# Patient Record
Sex: Female | Born: 1950 | Race: White | Hispanic: No | Smoking: Current some day smoker
Health system: Southern US, Community
[De-identification: ages and names within clinical notes are randomized; demographics above are authoritative.]

## PROBLEM LIST (undated history)

## (undated) DIAGNOSIS — I10 Essential (primary) hypertension: Secondary | ICD-10-CM

## (undated) DIAGNOSIS — F419 Anxiety disorder, unspecified: Secondary | ICD-10-CM

## (undated) DIAGNOSIS — G4733 Obstructive sleep apnea (adult) (pediatric): Secondary | ICD-10-CM

## (undated) DIAGNOSIS — F32A Depression, unspecified: Secondary | ICD-10-CM

## (undated) DIAGNOSIS — J449 Chronic obstructive pulmonary disease, unspecified: Secondary | ICD-10-CM

## (undated) DIAGNOSIS — M545 Low back pain, unspecified: Secondary | ICD-10-CM

## (undated) DIAGNOSIS — G8929 Other chronic pain: Secondary | ICD-10-CM

## (undated) DIAGNOSIS — K219 Gastro-esophageal reflux disease without esophagitis: Secondary | ICD-10-CM

## (undated) DIAGNOSIS — I739 Peripheral vascular disease, unspecified: Secondary | ICD-10-CM

## (undated) DIAGNOSIS — F329 Major depressive disorder, single episode, unspecified: Secondary | ICD-10-CM

## (undated) HISTORY — DX: Low back pain: M54.5

## (undated) HISTORY — DX: Chronic obstructive pulmonary disease, unspecified: J44.9

## (undated) HISTORY — PX: CATARACT EXTRACTION: SUR2

## (undated) HISTORY — DX: Obstructive sleep apnea (adult) (pediatric): G47.33

## (undated) HISTORY — PX: SKIN CANCER DESTRUCTION: SHX778

## (undated) HISTORY — DX: Depression, unspecified: F32.A

## (undated) HISTORY — DX: Major depressive disorder, single episode, unspecified: F32.9

## (undated) HISTORY — PX: ABDOMINAL HYSTERECTOMY: SHX81

## (undated) HISTORY — DX: Gastro-esophageal reflux disease without esophagitis: K21.9

## (undated) HISTORY — PX: TUBAL LIGATION: SHX77

## (undated) HISTORY — DX: Anxiety disorder, unspecified: F41.9

## (undated) HISTORY — DX: Low back pain, unspecified: M54.50

## (undated) HISTORY — DX: Other chronic pain: G89.29

---

## 2004-08-06 ENCOUNTER — Ambulatory Visit (HOSPITAL_COMMUNITY): Admission: RE | Admit: 2004-08-06 | Discharge: 2004-08-06 | Payer: Self-pay | Admitting: Family Medicine

## 2004-11-11 ENCOUNTER — Ambulatory Visit: Payer: Self-pay | Admitting: Internal Medicine

## 2004-11-30 ENCOUNTER — Encounter: Payer: Self-pay | Admitting: Internal Medicine

## 2004-11-30 ENCOUNTER — Ambulatory Visit (HOSPITAL_COMMUNITY): Admission: RE | Admit: 2004-11-30 | Discharge: 2004-11-30 | Payer: Self-pay | Admitting: Internal Medicine

## 2004-11-30 ENCOUNTER — Ambulatory Visit: Payer: Self-pay | Admitting: Internal Medicine

## 2004-11-30 HISTORY — PX: ESOPHAGOGASTRODUODENOSCOPY: SHX1529

## 2005-01-20 ENCOUNTER — Ambulatory Visit: Payer: Self-pay | Admitting: Internal Medicine

## 2005-01-20 ENCOUNTER — Ambulatory Visit (HOSPITAL_COMMUNITY): Admission: RE | Admit: 2005-01-20 | Discharge: 2005-01-20 | Payer: Self-pay | Admitting: Internal Medicine

## 2005-01-20 ENCOUNTER — Encounter: Payer: Self-pay | Admitting: Internal Medicine

## 2005-01-20 HISTORY — PX: COLONOSCOPY: SHX174

## 2005-07-30 ENCOUNTER — Ambulatory Visit: Payer: Self-pay | Admitting: *Deleted

## 2005-08-04 ENCOUNTER — Ambulatory Visit (HOSPITAL_COMMUNITY): Admission: RE | Admit: 2005-08-04 | Discharge: 2005-08-04 | Payer: Self-pay | Admitting: *Deleted

## 2005-08-04 ENCOUNTER — Ambulatory Visit: Payer: Self-pay | Admitting: *Deleted

## 2005-08-11 ENCOUNTER — Ambulatory Visit: Payer: Self-pay | Admitting: *Deleted

## 2007-06-16 ENCOUNTER — Emergency Department (HOSPITAL_COMMUNITY): Admission: EM | Admit: 2007-06-16 | Discharge: 2007-06-16 | Payer: Self-pay | Admitting: Emergency Medicine

## 2008-01-29 ENCOUNTER — Emergency Department (HOSPITAL_COMMUNITY): Admission: EM | Admit: 2008-01-29 | Discharge: 2008-01-29 | Payer: Self-pay | Admitting: Emergency Medicine

## 2008-05-07 ENCOUNTER — Emergency Department (HOSPITAL_COMMUNITY): Admission: EM | Admit: 2008-05-07 | Discharge: 2008-05-07 | Payer: Self-pay | Admitting: Emergency Medicine

## 2008-11-12 ENCOUNTER — Emergency Department (HOSPITAL_COMMUNITY): Admission: EM | Admit: 2008-11-12 | Discharge: 2008-11-12 | Payer: Self-pay | Admitting: Emergency Medicine

## 2008-12-18 ENCOUNTER — Emergency Department (HOSPITAL_COMMUNITY): Admission: EM | Admit: 2008-12-18 | Discharge: 2008-12-18 | Payer: Self-pay | Admitting: Emergency Medicine

## 2009-04-09 ENCOUNTER — Emergency Department (HOSPITAL_COMMUNITY): Admission: EM | Admit: 2009-04-09 | Discharge: 2009-04-09 | Payer: Self-pay | Admitting: Emergency Medicine

## 2009-12-26 ENCOUNTER — Inpatient Hospital Stay (HOSPITAL_COMMUNITY): Admission: EM | Admit: 2009-12-26 | Discharge: 2009-12-28 | Payer: Self-pay | Admitting: Emergency Medicine

## 2010-07-24 LAB — CULTURE, BLOOD (ROUTINE X 2): Culture: NO GROWTH

## 2010-07-24 LAB — URINE MICROSCOPIC-ADD ON

## 2010-07-24 LAB — CBC
HCT: 45.6 % (ref 36.0–46.0)
Hemoglobin: 13.7 g/dL (ref 12.0–15.0)
MCH: 31.4 pg (ref 26.0–34.0)
MCH: 31.7 pg (ref 26.0–34.0)
MCHC: 33.5 g/dL (ref 30.0–36.0)
Platelets: 306 10*3/uL (ref 150–400)
Platelets: 362 10*3/uL (ref 150–400)
RBC: 4.33 MIL/uL (ref 3.87–5.11)
RDW: 13.1 % (ref 11.5–15.5)
RDW: 13.8 % (ref 11.5–15.5)
WBC: 19.3 10*3/uL — ABNORMAL HIGH (ref 4.0–10.5)

## 2010-07-24 LAB — BASIC METABOLIC PANEL
BUN: 11 mg/dL (ref 6–23)
BUN: 17 mg/dL (ref 6–23)
BUN: 6 mg/dL (ref 6–23)
BUN: 8 mg/dL (ref 6–23)
CO2: 23 mEq/L (ref 19–32)
CO2: 24 mEq/L (ref 19–32)
Calcium: 8.2 mg/dL — ABNORMAL LOW (ref 8.4–10.5)
Calcium: 8.3 mg/dL — ABNORMAL LOW (ref 8.4–10.5)
Calcium: 8.5 mg/dL (ref 8.4–10.5)
Chloride: 107 mEq/L (ref 96–112)
Chloride: 98 mEq/L (ref 96–112)
Creatinine, Ser: 0.59 mg/dL (ref 0.4–1.2)
Creatinine, Ser: 0.64 mg/dL (ref 0.4–1.2)
Creatinine, Ser: 0.66 mg/dL (ref 0.4–1.2)
Creatinine, Ser: 0.82 mg/dL (ref 0.4–1.2)
GFR calc Af Amer: >60 mL/min (ref >60)
GFR calc Af Amer: >60 mL/min (ref >60)
GFR calc Af Amer: >60 mL/min (ref >60)
GFR calc non Af Amer: >60 mL/min (ref >60)
GFR calc non Af Amer: >60 mL/min (ref >60)
GFR calc non Af Amer: >60 mL/min (ref >60)
Glucose, Bld: 102 mg/dL — ABNORMAL HIGH (ref 70–99)
Glucose, Bld: 116 mg/dL — ABNORMAL HIGH (ref 70–99)
Glucose, Bld: 168 mg/dL — ABNORMAL HIGH (ref 70–99)
Glucose, Bld: 89 mg/dL (ref 70–99)
Potassium: 2.3 mEq/L — CL (ref 3.5–5.1)
Potassium: 2.5 mEq/L — CL (ref 3.5–5.1)
Potassium: 3.8 mEq/L (ref 3.5–5.1)
Sodium: 136 mEq/L (ref 135–145)
Sodium: 140 mEq/L (ref 135–145)
Sodium: 141 mEq/L (ref 135–145)

## 2010-07-24 LAB — RAPID URINE DRUG SCREEN, HOSP PERFORMED: Benzodiazepines: NOT DETECTED

## 2010-07-24 LAB — DIFFERENTIAL
Basophils Absolute: 0.1 10*3/uL (ref 0.0–0.1)
Basophils Absolute: 0.1 10*3/uL (ref 0.0–0.1)
Basophils Relative: 0 % (ref 0–1)
Basophils Relative: 0 % (ref 0–1)
Basophils Relative: 1 % (ref 0–1)
Eosinophils Absolute: 0 10*3/uL (ref 0.0–0.7)
Eosinophils Absolute: 0.2 10*3/uL (ref 0.0–0.7)
Lymphocytes Relative: 19 % (ref 12–46)
Lymphocytes Relative: 26 % (ref 12–46)
Monocytes Absolute: 0.9 10*3/uL (ref 0.1–1.0)
Monocytes Relative: 8 % (ref 3–12)
Neutro Abs: 14.1 10*3/uL — ABNORMAL HIGH (ref 1.7–7.7)
Neutro Abs: 8.7 10*3/uL — ABNORMAL HIGH (ref 1.7–7.7)
Neutrophils Relative %: 66 % (ref 43–77)

## 2010-07-24 LAB — URINALYSIS, ROUTINE W REFLEX MICROSCOPIC
Bilirubin Urine: NEGATIVE
Ketones, ur: NEGATIVE mg/dL
Leukocytes, UA: NEGATIVE
Protein, ur: 100 mg/dL — AB
Specific Gravity, Urine: 1.01 (ref 1.005–1.030)
Urobilinogen, UA: 0.2 mg/dL (ref 0.0–1.0)

## 2010-07-24 LAB — CK
Total CK: 467 U/L — ABNORMAL HIGH (ref 7–177)
Total CK: 727 U/L — ABNORMAL HIGH (ref 7–177)

## 2010-07-24 LAB — CARDIAC PANEL(CRET KIN+CKTOT+MB+TROPI)
CK, MB: 13.9 ng/mL (ref 0.3–4.0)
Relative Index: 1 (ref 0.0–2.5)
Total CK: 1265 U/L — ABNORMAL HIGH (ref 7–177)
Total CK: 1632 U/L — ABNORMAL HIGH (ref 7–177)
Troponin I: 0.04 ng/mL (ref 0.00–0.06)

## 2010-07-24 LAB — TROPONIN I: Troponin I: 0.06 ng/mL (ref 0.00–0.06)

## 2010-07-24 LAB — TSH: TSH: 1.014 u[IU]/mL (ref 0.350–4.500)

## 2010-07-24 LAB — ETHANOL: Alcohol, Ethyl (B): <5 mg/dL (ref 0–10)

## 2010-07-24 LAB — MAGNESIUM: Magnesium: 2.2 mg/dL (ref 1.5–2.5)

## 2010-07-24 LAB — PHOSPHORUS: Phosphorus: 2.8 mg/dL (ref 2.3–4.6)

## 2010-08-16 LAB — CBC
Hemoglobin: 15.7 g/dL — ABNORMAL HIGH (ref 12.0–15.0)
MCHC: 34.6 g/dL (ref 30.0–36.0)
MCV: 92.6 fL (ref 78.0–100.0)
RBC: 4.9 MIL/uL (ref 3.87–5.11)

## 2010-08-16 LAB — BASIC METABOLIC PANEL
CO2: 29 mEq/L (ref 19–32)
Chloride: 105 mEq/L (ref 96–112)
Creatinine, Ser: 0.65 mg/dL (ref 0.4–1.2)
GFR calc Af Amer: >60 mL/min (ref >60)

## 2010-08-16 LAB — DIFFERENTIAL
Basophils Relative: 0 % (ref 0–1)
Eosinophils Absolute: 0.1 10*3/uL (ref 0.0–0.7)
Eosinophils Relative: 1 % (ref 0–5)
Monocytes Absolute: 0.8 10*3/uL (ref 0.1–1.0)
Monocytes Relative: 7 % (ref 3–12)
Neutrophils Relative %: 56 % (ref 43–77)

## 2010-08-16 LAB — URINALYSIS, ROUTINE W REFLEX MICROSCOPIC
Bilirubin Urine: NEGATIVE
Ketones, ur: NEGATIVE mg/dL
Nitrite: NEGATIVE
pH: 5.5 (ref 5.0–8.0)

## 2010-09-25 NOTE — Procedures (Signed)
NAMEEARNIE, ROCKHOLD               ACCOUNT NO.:  [id number]   MEDICAL RECORD NO.:  [id number]          PATIENT TYPE:  OUT   LOCATION:  RAD                           FACILITY:  [facility]   PHYSICIAN:  [place] Bing, M.D. [facility] OF BIRTH:  09-29-1950   DATE OF PROCEDURE:  08/04/2005  DATE OF DISCHARGE:                                  ECHOCARDIOGRAM   REFERRING:  Dr. Phillips Odor and Dr. Dorethea Clan.   CLINICAL DATA:  A 60 year old woman with hypertension and chest pain.   1.  Baseline echocardiogram reveals normal chamber dimensions; no      significant valvular abnormalities; normal left ventricular size and      function without significant LVH.  2.  Progressive dobutamine infusion undertaken.  The patient reported      palpitations and leg pain associated with dyspnea.  3.  A peak heart rate of 142 was achieved, 87% of the patient's age-      predicted maximum.  4.  No significant arrhythmias - few PACs and PVCs.  5.  Baseline EKG:  Sinus bradycardia; prominent voltage; delayed R-wave      progression; otherwise normal. Stress EKG:  No significant change.  6.  Post - exercise imaging:  Hyperdynamic function in all segments with a      decrease in left ventricular volume.   IMPRESSION:  Negative pharmacologic stress echocardiogram revealing neither  electrocardiographic nor echocardiographic evidence for myocardial ischemia  or infarction.  Other findings as noted.      [place] Bing, M.D. [facility]  Electronically Signed     RR/MEDQ  D:  08/04/2005  T:  08/06/2005  Job:  [id number]

## 2010-09-25 NOTE — Op Note (Signed)
Lauren King, Lauren King               ACCOUNT NO.:  [id number]   MEDICAL RECORD NO.:  [id number]          PATIENT TYPE:  AMB   LOCATION:  DAY                           FACILITY:  [facility]   PHYSICIAN:  R. Roetta Sessions, M.D. DATE OF BIRTH:  Jun 25, 1950   DATE OF PROCEDURE:  11/30/2004  DATE OF DISCHARGE:                                 OPERATIVE REPORT   PROCEDURE:  Esophagogastroduodenoscopy with balloon dilation and probable  biopsy.   ENDOSCOPIST:  Jonathon Bellows, M.D.   INDICATIONS FOR PROCEDURE:  The patient is a 60 year old lady with  intermittent esophageal dysphagia worse with anxiety episodes, intermittent  abdominal cramps, and diarrhea, more recently improved with NuLev.  EGD is  now being done to further evaluate her upper GI tract symptoms.  The  potential risks, benefits, and alternatives have been discussed and  questions answered.  She is agreeable.  Please see documentation in the  medical record.   Oxygen saturation, blood pressure, pulse oximetry were monitored throughout  the entire procedure.  Conscious sedation with Versed 5 mg intravenously,  Demerol 100 mg intravenously was given in divided doses.   INSTRUMENT:  Olympus video system.   FINDINGS:  1.  Esophagus: View of the tubular esophagus revealed an undulating      essential Z-line and a 3 x 5 mm patch of salmon-colored epithelium in      the distal esophagus with a good centimeter of intervening pearly-gray      normal-appearing esophageal mucosa between this area and the EG      junction.  Esophageal mucosa, otherwise, appeared normal.  There was no      ring or stricture.  Tubular esophagus was widely patent and easily      traversed.  2.  Stomach: The gastric cavity was empty and insufflated well there.  A      thorough examination of the gastric mucosa including retroflexed view of      the proximal view of the proximal stomach and esophagogastric junction      demonstrated linear antral erosions and  a couple of small stellate-      shaped erosions in the prepyloric antral mucosa.  No infiltrating      process, no frank ulcer.  Pylorus patent and easily traversed.      Examination of the bulb to the second portion revealed no abnormalities      aside from a solitary AVM in the posterior bulb.   THERAPY/DIAGNOSTIC MANEUVERS PERFORMED:  A 54-French balloon dilator was  passed with full insertion.  Loop back revealed no apparent complication  related to passage of the dilator.  The patch of salmon-colored epithelium  in the distal esophagus was biopsied for histologic study.  The patient  tolerated the procedure well and was reactive in the endoscopy room.   IMPRESSION:  1.  A small patch of salmon-colored epithelium (salmon-like lesion in the      distal esophagus) biopsied, otherwise normal tubular esophagus status      post dilation prior to biopsy.  2.  Antral pre-pyloric erosions of uncertain significance, otherwise,  normal      gastric mucosa.  3.  Solitary duodenal bulbar arteriovenous malformation, otherwise, normal      D1 and D2.   RECOMMENDATIONS:  1.  Follow up on pathology.  2.  Obtain Helicobacter pylori serologies.  3.  Continue NuLev.  4.  Further recommendations to follow.       RMR/MEDQ  D:  11/30/2004  T:  11/30/2004  Job:  [id number]   cc:   Corrie Mckusick, M.D.  Fax: [phone]

## 2010-09-25 NOTE — Op Note (Signed)
NAMECASSI, JENNE               ACCOUNT NO.:  [id number]   MEDICAL RECORD NO.:  [id number]          PATIENT TYPE:  AMB   LOCATION:  DAY                           FACILITY:  [facility]   PHYSICIAN:  R. Roetta Sessions, M.D. DATE OF BIRTH:  01/15/1951   DATE OF PROCEDURE:  01/20/2005  DATE OF DISCHARGE:                                 OPERATIVE REPORT   PROCEDURE:  Colonoscopy with snare polypectomy ablation.   INDICATIONS FOR PROCEDURE:  The patient is a 60 year old lady who currently  has no lower GI tract symptoms who has never had a colonoscopy who comes now  for screening. She has never had her colon imaged previously. There is no  family history colorectal neoplasia. Colonoscopy is now being done.  Potential risks, benefits, and alternatives have been reviewed and questions  answered. She is agreeable. Please see documentation in the medical record.   PROCEDURE NOTE:  O2 saturation, blood pressure, pulse, and respirations were  monitored throughout the entire procedure. Conscious sedation with Versed 5  mg IV and Demerol 125 mg IV in divided doses.   INSTRUMENT:  Olympus video chip system.   FINDINGS:  Digital rectal exam revealed no abnormalities.   ENDOSCOPIC FINDINGS:  Prep was adequate.   Rectum:  Examination of the rectal mucosa including retroflexed view of the  anal verge revealed no abnormalities.   Colon:  Colonic mucosa was surveyed from the rectosigmoid junction through  the left, transverse, and right colon to the area of the appendiceal  orifice, ileocecal valve, and cecum. These structures were well seen and  photographed for the record. From this level, the scope was slowly  withdrawn, and all previously mentioned mucosal surfaces were again seen.  The patient was noted to have a 1.5 cm x 2 cm sausage-shaped polyp on a fold  in the mid ascending colon. It was resected with snare cautery and recovered  through the scope. There were a couple of diminutive polyps at  30 cm which  were ablated with the tip of the snare cautery unit. The remainder of the  colonic mucosa appeared normal. The patient tolerated the procedure well and  was reactive to endoscopy.   IMPRESSION:  1.  Normal rectum.  2.  Diminutive polyps at 30 cm ablated with the tip of the snare cautery      unit.  3.  Ascending colon polyp removed with snare polypectomy as described above.      The remainder of the colonic mucosa appeared normal.   RECOMMENDATIONS:  1.  No aspirin or arthritis medications for 10 days.  2.  Follow up on pathology.  3.  Further recommendations to follow.      Jonathon Bellows, M.D.  Electronically Signed     RMR/MEDQ  D:  01/20/2005  T:  01/20/2005  Job:  [id number]   cc:   Corrie Mckusick, M.D.  Fax: [phone]

## 2010-09-25 NOTE — Procedures (Signed)
[name], Lauren King               ACCOUNT NO.:  [id number]   MEDICAL RECORD NO.:  [id number]          PATIENT TYPE:  OUT   LOCATION:  RAD                           FACILITY:  [facility]   PHYSICIAN:  [place] Bing, M.D. [facility] OF BIRTH:  Jan 14, 1951   DATE OF PROCEDURE:  08/04/2005  DATE OF DISCHARGE:                                  ECHOCARDIOGRAM   REFERRING:  Dr. Phillips Odor and Dr. Dorethea Clan.   CLINICAL DATA:  A 60 year old woman with chest pain, hypertension and  diabetes.   M-MODE:  Aorta 2.7, left atrium 2.5, septum 1.0, posterior wall 0.8, LV  diastole 4.3, LV systole 3.3.   1.  Technically adequate echocardiographic study.  2.  Normal left atrium, right atrium and right ventricle.  3.  Slight thickening of the mitral and aortic valve; trileaflet aortic      valve; normal mitral and aortic valve function.  4.  Normal pulmonic valve, tricuspid valve and proximal pulmonary artery.  5.  Normal left ventricular size; normal wall thickness; normal regional and      global function.  6.  Normal IVC.      [place] Bing, M.D. [facility]  Electronically Signed     RR/MEDQ  D:  08/04/2005  T:  08/06/2005  Job:  [id number]

## 2011-02-08 LAB — URINALYSIS, ROUTINE W REFLEX MICROSCOPIC
Bilirubin Urine: NEGATIVE
Ketones, ur: NEGATIVE
Protein, ur: NEGATIVE
Urobilinogen, UA: 0.2

## 2012-08-29 ENCOUNTER — Encounter: Payer: Self-pay | Admitting: Internal Medicine

## 2012-09-20 ENCOUNTER — Encounter: Payer: Self-pay | Admitting: Internal Medicine

## 2012-09-21 ENCOUNTER — Ambulatory Visit (INDEPENDENT_AMBULATORY_CARE_PROVIDER_SITE_OTHER): Payer: Medicare Other | Admitting: Gastroenterology

## 2012-09-21 ENCOUNTER — Encounter: Payer: Self-pay | Admitting: Gastroenterology

## 2012-09-21 VITALS — BP 139/85 | HR 87 | Temp 98.2°F | Ht 62.0 in | Wt 127.8 lb

## 2012-09-21 DIAGNOSIS — R1314 Dysphagia, pharyngoesophageal phase: Secondary | ICD-10-CM

## 2012-09-21 DIAGNOSIS — K219 Gastro-esophageal reflux disease without esophagitis: Secondary | ICD-10-CM

## 2012-09-21 DIAGNOSIS — R634 Abnormal weight loss: Secondary | ICD-10-CM

## 2012-09-21 DIAGNOSIS — R1310 Dysphagia, unspecified: Secondary | ICD-10-CM

## 2012-09-21 NOTE — Assessment & Plan Note (Signed)
62 year old lady with history of chronic GERD who presents with recent progressive esophageal dysphagia. Suspect esophageal stricture. Typical GERD symptoms fairly well-controlled with occasional breakthrough symptoms. She has had a modest weight loss in the last few weeks which she states is related to her dysphagia. EGD with dilation in the near future.  I have discussed the risks, alternatives, benefits with regards to but not limited to the risk of reaction to medication, bleeding, infection, perforation and the patient is agreeable to proceed. Written consent to be obtained.  Given her polypharmacy, will augment conscious sedation with Phenergan 25 mg IV 30 minutes before the procedure.

## 2012-09-21 NOTE — Progress Notes (Signed)
Primary Care Physician:  Ernestine Conrad, MD  Primary Gastroenterologist:  Roetta Sessions, MD   Chief Complaint  Patient presents with  . Dysphagia    HPI:  Lauren King is a 62 y.o. female here with couple of month history of dysphagia to breads/meats. Intermittent heartburn on Nexium. Flushes food down with liquids. No n/v. No abdominal pain. No constipation, diarrhea, melena, brbpr. Weight loss of six pounds in the last three weeks.    Current Outpatient Prescriptions  Medication Sig Dispense Refill  . ALPRAZolam (XANAX) 1 MG tablet Take 1 mg by mouth at bedtime as needed for sleep.      [name] esomeprazole (NEXIUM) 40 MG capsule Take 40 mg by mouth daily before breakfast.      . Fluticasone-Salmeterol (ADVAIR) 250-50 MCG/DOSE AEPB Inhale 1 puff into the lungs every 12 (twelve) hours.      . gabapentin (NEURONTIN) 600 MG tablet Take 600 mg by mouth 3 (three) times daily.      [name] HYDROcodone-acetaminophen (LORTAB) 7.5-500 MG per tablet Take 1 tablet by mouth every 6 (six) hours as needed for pain.      [name] ibuprofen (ADVIL,MOTRIN) 800 MG tablet Take 800 mg by mouth every 8 (eight) hours as needed for pain.      [name] lisinopril (PRINIVIL,ZESTRIL) 5 MG tablet Take 5 mg by mouth daily.      . QUEtiapine (SEROQUEL) 300 MG tablet Take 300 mg by mouth at bedtime.      . sertraline (ZOLOFT) 100 MG tablet Take 100 mg by mouth daily.       No current facility-administered medications for this visit.    Allergies as of 09/21/2012  . (No Known Allergies)    Past Medical History  Diagnosis Date  . COPD (chronic obstructive pulmonary disease)   . Depression   . Anxiety   . Chronic lower back pain   . Diabetes mellitus type 2, diet-controlled   . Obstructive sleep apnea     CPAP  . GERD (gastroesophageal reflux disease)     Past Surgical History  Procedure Laterality Date  . Esophagogastroduodenoscopy  11/30/2004    RMR:A small patch of salmon-colored epithelium (salmon-like lesion in the distal  esophagus)/Antral pre-pyloric erosions of uncertain significance, otherwise, normal. solitary duodenal bulbar AVM.No Barrett's.  . Colonoscopy  01/20/2005    XBJ:YNWGNFAOZ King polyp/Diminutive polyps at 30 cm ablated/Normal rectum. hyperplastic polyp  . Tubal ligation      with incidental appendectomy  . Abdominal hysterectomy    . Skin cancer destruction      Family History  Problem Relation Age of Onset  . King cancer Neg Hx   . Lung cancer Sister   . Lung cancer Brother     History   Social History  . Marital Status: Divorced    Spouse Name: N/A    Number of Children: N/A  . Years of Education: N/A   Occupational History  . Not on file.   Social History Main Topics  . Smoking status: Current Every Day Smoker -- 0.50 packs/day    Types: Cigarettes  . Smokeless tobacco: Not on file     Comment: 5 a day  . Alcohol Use: No  . Drug Use: No  . Sexually Active: Not on file   Other Topics Concern  . Not on file   Social History Narrative  . No narrative on file      ROS:  General: Negative for anorexia, fever, chills, fatigue, weakness. She history of  present illness Eyes: Negative for vision changes.  ENT: Negative for hoarseness,  nasal congestion. CV: Negative for chest pain, angina, palpitations, dyspnea on exertion, peripheral edema.  Respiratory: Negative for dyspnea at rest, dyspnea on exertion, cough, sputum, wheezing.  GI: See history of present illness. GU:  Negative for dysuria, hematuria, urinary incontinence, urinary frequency, nocturnal urination.  MS: Negative for joint pain. Chronic low back pain.  Derm: Negative for rash or itching.  Neuro: Negative for weakness, abnormal sensation, seizure, frequent headaches, memory loss, confusion.  Psych: Negative for anxiety, depression, suicidal ideation, hallucinations.  Endo: See history of present illness Heme: Negative for bruising or bleeding. Allergy: Negative for rash or hives.    Physical  Examination:  BP 139/85  Pulse 87  Temp(Src) 98.2 F (36.8 C) (Oral)  Ht 5\' 2"  (1.575 m)  Wt 127 lb 12.8 oz (57.97 kg)  BMI 23.37 kg/m2   General: Well-nourished, well-developed in no acute distress. Anxious Head: Normocephalic, atraumatic.   Eyes: Conjunctiva pink, no icterus. Mouth: Oropharyngeal mucosa moist and pink , no lesions erythema or exudate. Neck: Supple without thyromegaly, masses, or lymphadenopathy.  Lungs: Clear to auscultation bilaterally.  Heart: Regular rate and rhythm, no murmurs rubs or gallops.  Abdomen: Bowel sounds are normal, nontender, nondistended, no hepatosplenomegaly or masses, no abdominal bruits or    hernia , no rebound or guarding.   Rectal: Not performed Extremities: No lower extremity edema. No clubbing or deformities.  Neuro: Alert and oriented x 4 , grossly normal neurologically.  Skin: Warm and dry, no rash or jaundice.   Psych: Alert and cooperative, normal mood and affect.  Labs: Labs from April 2014. White blood cell count 10,200, hemoglobin 12.3, MCV 92.4, platelets 363,000, creatinine 0.62, calcium 8.5, total bilirubin 0.1, alkaline phosphatase 121, AST 18, ALT 15, albumin 3.9, hemoglobin A1c 5.6, TSH 4.38.  Imaging Studies: No results found.

## 2012-09-21 NOTE — Progress Notes (Signed)
Cc PCP 

## 2012-09-21 NOTE — Patient Instructions (Addendum)
1. We have scheduled you for an upper endoscopy with esophageal dilation by Dr. Jena Gauss. Please see separate instructions. 2.  please maintain a soft diet until your procedure. Keep plenty of fluids available to help you wash your food and pills down.

## 2012-09-22 ENCOUNTER — Encounter (HOSPITAL_COMMUNITY): Payer: Self-pay | Admitting: *Deleted

## 2012-09-22 ENCOUNTER — Encounter (HOSPITAL_COMMUNITY)
Admission: RE | Disposition: A | Discharge status: Home / Self Care | Payer: Self-pay | Source: Ambulatory Visit | Attending: Internal Medicine

## 2012-09-22 ENCOUNTER — Ambulatory Visit (HOSPITAL_COMMUNITY)
Admission: RE | Admit: 2012-09-22 | Discharge: 2012-09-22 | Disposition: A | Discharge status: Home / Self Care | Payer: Medicare Other | Source: Ambulatory Visit | Attending: Internal Medicine | Admitting: Internal Medicine

## 2012-09-22 DIAGNOSIS — R634 Abnormal weight loss: Secondary | ICD-10-CM

## 2012-09-22 DIAGNOSIS — K2100 Gastro-esophageal reflux disease with esophagitis, without bleeding: Secondary | ICD-10-CM | POA: Insufficient documentation

## 2012-09-22 DIAGNOSIS — J449 Chronic obstructive pulmonary disease, unspecified: Secondary | ICD-10-CM | POA: Insufficient documentation

## 2012-09-22 DIAGNOSIS — K259 Gastric ulcer, unspecified as acute or chronic, without hemorrhage or perforation: Secondary | ICD-10-CM | POA: Insufficient documentation

## 2012-09-22 DIAGNOSIS — K449 Diaphragmatic hernia without obstruction or gangrene: Secondary | ICD-10-CM | POA: Insufficient documentation

## 2012-09-22 DIAGNOSIS — K219 Gastro-esophageal reflux disease without esophagitis: Secondary | ICD-10-CM

## 2012-09-22 DIAGNOSIS — Z79899 Other long term (current) drug therapy: Secondary | ICD-10-CM | POA: Insufficient documentation

## 2012-09-22 DIAGNOSIS — K2289 Other specified disease of esophagus: Secondary | ICD-10-CM | POA: Insufficient documentation

## 2012-09-22 DIAGNOSIS — G8929 Other chronic pain: Secondary | ICD-10-CM | POA: Insufficient documentation

## 2012-09-22 DIAGNOSIS — M545 Low back pain, unspecified: Secondary | ICD-10-CM | POA: Insufficient documentation

## 2012-09-22 DIAGNOSIS — J4489 Other specified chronic obstructive pulmonary disease: Secondary | ICD-10-CM | POA: Insufficient documentation

## 2012-09-22 DIAGNOSIS — F329 Major depressive disorder, single episode, unspecified: Secondary | ICD-10-CM | POA: Insufficient documentation

## 2012-09-22 DIAGNOSIS — E119 Type 2 diabetes mellitus without complications: Secondary | ICD-10-CM | POA: Insufficient documentation

## 2012-09-22 DIAGNOSIS — G4733 Obstructive sleep apnea (adult) (pediatric): Secondary | ICD-10-CM | POA: Insufficient documentation

## 2012-09-22 DIAGNOSIS — K228 Other specified diseases of esophagus: Secondary | ICD-10-CM | POA: Insufficient documentation

## 2012-09-22 DIAGNOSIS — K2960 Other gastritis without bleeding: Secondary | ICD-10-CM

## 2012-09-22 DIAGNOSIS — F411 Generalized anxiety disorder: Secondary | ICD-10-CM | POA: Insufficient documentation

## 2012-09-22 DIAGNOSIS — R1310 Dysphagia, unspecified: Secondary | ICD-10-CM | POA: Insufficient documentation

## 2012-09-22 DIAGNOSIS — F3289 Other specified depressive episodes: Secondary | ICD-10-CM | POA: Insufficient documentation

## 2012-09-22 DIAGNOSIS — F17200 Nicotine dependence, unspecified, uncomplicated: Secondary | ICD-10-CM | POA: Insufficient documentation

## 2012-09-22 HISTORY — PX: ESOPHAGOGASTRODUODENOSCOPY (EGD) WITH ESOPHAGEAL DILATION: SHX5812

## 2012-09-22 SURGERY — ESOPHAGOGASTRODUODENOSCOPY (EGD) WITH ESOPHAGEAL DILATION
Anesthesia: Moderate Sedation

## 2012-09-22 MED ORDER — PROMETHAZINE HCL 25 MG/ML IJ SOLN
25.0000 mg | Freq: Once | INTRAMUSCULAR | Status: AC
  Administered 2012-09-22: 25 mg via INTRAVENOUS

## 2012-09-22 MED ORDER — MEPERIDINE HCL 100 MG/ML IJ SOLN
INTRAMUSCULAR | Status: AC
  Filled 2012-09-22: qty 1

## 2012-09-22 MED ORDER — MIDAZOLAM HCL 5 MG/5ML IJ SOLN
INTRAMUSCULAR | Status: AC
  Filled 2012-09-22: qty 10

## 2012-09-22 MED ORDER — SODIUM CHLORIDE 0.9 % IV SOLN
INTRAVENOUS | Status: DC
  Administered 2012-09-22: 08:00:00 via INTRAVENOUS

## 2012-09-22 MED ORDER — ONDANSETRON HCL 4 MG/2ML IJ SOLN
INTRAMUSCULAR | Status: DC | PRN
  Administered 2012-09-22: 4 mg via INTRAVENOUS

## 2012-09-22 MED ORDER — PROMETHAZINE HCL 25 MG/ML IJ SOLN
INTRAMUSCULAR | Status: AC
  Filled 2012-09-22: qty 1

## 2012-09-22 MED ORDER — MEPERIDINE HCL 100 MG/ML IJ SOLN
INTRAMUSCULAR | Status: DC | PRN
  Administered 2012-09-22: 50 mg via INTRAVENOUS
  Administered 2012-09-22: 25 mg via INTRAVENOUS
  Administered 2012-09-22: 50 mg via INTRAVENOUS

## 2012-09-22 MED ORDER — ONDANSETRON HCL 4 MG/2ML IJ SOLN
INTRAMUSCULAR | Status: AC
  Filled 2012-09-22: qty 2

## 2012-09-22 MED ORDER — SODIUM CHLORIDE 0.9 % IJ SOLN
INTRAMUSCULAR | Status: AC
  Filled 2012-09-22: qty 10

## 2012-09-22 MED ORDER — MIDAZOLAM HCL 5 MG/5ML IJ SOLN
INTRAMUSCULAR | Status: DC | PRN
  Administered 2012-09-22 (×3): 2 mg via INTRAVENOUS

## 2012-09-22 MED ORDER — STERILE WATER FOR IRRIGATION IR SOLN
Status: DC | PRN
  Administered 2012-09-22: 08:00:00

## 2012-09-22 MED ORDER — BUTAMBEN-TETRACAINE-BENZOCAINE 2-2-14 % EX AERO
INHALATION_SPRAY | CUTANEOUS | Status: DC | PRN
  Administered 2012-09-22: 2 via TOPICAL

## 2012-09-22 NOTE — Op Note (Signed)
[facility] [address] [place], [address]   ENDOSCOPY PROCEDURE REPORT  PATIENT: Lauren King, Lauren King  MR#: [id number] BIRTHDATE: 08-May-1951 , 61  yrs. old GENDER: Female ENDOSCOPIST: R.  Roetta Sessions, MD FACP [facility] REFERRED BY:  Ernestine Conrad, M.D. PROCEDURE DATE:  09/22/2012 PROCEDURE:     EGD with Elease Hashimoto dilation followed esophageal and gastric biopsy  INDICATIONS:     esophageal dysphagia; history of GERD  INFORMED CONSENT:   The risks, benefits, limitations, alternatives and imponderables have been discussed.  The potential for biopsy, esophogeal dilation, etc. have also been reviewed.  Questions have been answered.  All parties agreeable.  Please see the history and physical in the medical record for more information.  MEDICATIONS:  Versed 6 mg IV and Demerol 125 mg IV in divided doses. Phenergan 25 mg IV. Zofran 4 mg IV.  DESCRIPTION OF PROCEDURE:   The EG-2990i ([id number])  endoscope was introduced through the mouth and advanced to the second portion of the duodenum without difficulty or limitations.  The mucosal surfaces were surveyed very carefully during advancement of the scope and upon withdrawal.  Retroflexion view of the proximal stomach and esophagogastric junction was performed.      FINDINGS: Longitudinal distal esophageal erosions coming up from the EG junction  - the longest 3 cm in length. There was also a 2 cm "tongue" of salmon coated epithelium coming up from the GE junction. The tubular esophagus appeared patent throughout its course. Nothing endoscopically to suggest  eosinophilic esophagitis .  Stomach empty. Small hiatal hernia.  multiple antral erosions and several 1-3 mm areas of ulceration. No infiltrating process. Patent pylorus. Normal first and second portion of the duodenum  THERAPEUTIC / DIAGNOSTIC MANEUVERS PERFORMED:  A 54 French Maloney dilator is passed to full insertion easily. A look back revealed no apparent  complication related to this maneuver. Subsequently, biopsies of abnormal distal esophagus taken for histologic study. Finally, biopsies abnormal antrum taken.   COMPLICATIONS:  None  IMPRESSION:    Erosive reflux esophagitis. Query short segment Barrett's esophagus. Status post Maloney dilation and esophageal biopsy. Antral erosions and ulcerations. Status post biopsy.  RECOMMENDATIONS:    Increase Nexium to 40 mg orally twice daily. Minimize use of nonsteroidals for now.  Further recommendations to follow pending review of pathology report    _______________________________ R. Roetta Sessions, MD FACP [facility] eSigned:  R. Roetta Sessions, MD FACP [facility] 09/22/2012 8:25 AM     CC:  PATIENT NAME:  Lauren King, Lauren King MR#: [id number]

## 2012-09-22 NOTE — Interval H&P Note (Signed)
History and Physical Interval Note:  09/22/2012 7:37 AM  Lauren King  has presented today for surgery, with the diagnosis of DYSPHAGIA, GERD AND WEIGHT LOSS  The various methods of treatment have been discussed with the patient and family. After consideration of risks, benefits and other options for treatment, the patient has consented to  Procedure(s) with comments: ESOPHAGOGASTRODUODENOSCOPY (EGD) WITH ESOPHAGEAL DILATION (N/A) - 7:30 as a surgical intervention .  The patient's history has been reviewed, patient examined, no change in status, stable for surgery.  I have reviewed the patient's chart and labs.  Questions were answered to the patient's satisfaction.     [name]  EGD with esophageal dilation as appropriate.The risks, benefits, limitations, alternatives and imponderables have been reviewed with the patient. Potential for esophageal dilation, biopsy, etc. have also been reviewed.  Questions have been answered. All parties agreeable.

## 2012-09-22 NOTE — H&P (View-Only) (Signed)
Primary Care Physician:  BLUTH, KIRK, MD  Primary Gastroenterologist:  Michael Rourk, MD   Chief Complaint  Patient presents with  . Dysphagia    HPI:  Lauren King is a 61 y.o. female here with couple of month history of dysphagia to breads/meats. Intermittent heartburn on Nexium. Flushes food down with liquids. No n/v. No abdominal pain. No constipation, diarrhea, melena, brbpr. Weight loss of six pounds in the last three weeks.    Current Outpatient Prescriptions  Medication Sig Dispense Refill  . ALPRAZolam (XANAX) 1 MG tablet Take 1 mg by mouth at bedtime as needed for sleep.      . esomeprazole (NEXIUM) 40 MG capsule Take 40 mg by mouth daily before breakfast.      . Fluticasone-Salmeterol (ADVAIR) 250-50 MCG/DOSE AEPB Inhale 1 puff into the lungs every 12 (twelve) hours.      . gabapentin (NEURONTIN) 600 MG tablet Take 600 mg by mouth 3 (three) times daily.      . HYDROcodone-acetaminophen (LORTAB) 7.5-500 MG per tablet Take 1 tablet by mouth every 6 (six) hours as needed for pain.      . ibuprofen (ADVIL,MOTRIN) 800 MG tablet Take 800 mg by mouth every 8 (eight) hours as needed for pain.      . lisinopril (PRINIVIL,ZESTRIL) 5 MG tablet Take 5 mg by mouth daily.      . QUEtiapine (SEROQUEL) 300 MG tablet Take 300 mg by mouth at bedtime.      . sertraline (ZOLOFT) 100 MG tablet Take 100 mg by mouth daily.       No current facility-administered medications for this visit.    Allergies as of 09/21/2012  . (No Known Allergies)    Past Medical History  Diagnosis Date  . COPD (chronic obstructive pulmonary disease)   . Depression   . Anxiety   . Chronic lower back pain   . Diabetes mellitus type 2, diet-controlled   . Obstructive sleep apnea     CPAP  . GERD (gastroesophageal reflux disease)     Past Surgical History  Procedure Laterality Date  . Esophagogastroduodenoscopy  11/30/2004    RMR:A small patch of salmon-colored epithelium (salmon-like lesion in the distal  esophagus)/Antral pre-pyloric erosions of uncertain significance, otherwise, normal. solitary duodenal bulbar AVM.No Barrett's.  . Colonoscopy  01/20/2005    RMR:Ascending colon polyp/Diminutive polyps at 30 cm ablated/Normal rectum. hyperplastic polyp  . Tubal ligation      with incidental appendectomy  . Abdominal hysterectomy    . Skin cancer destruction      Family History  Problem Relation Age of Onset  . Colon cancer Neg Hx   . Lung cancer Sister   . Lung cancer Brother     History   Social History  . Marital Status: Divorced    Spouse Name: N/A    Number of Children: N/A  . Years of Education: N/A   Occupational History  . Not on file.   Social History Main Topics  . Smoking status: Current Every Day Smoker -- 0.50 packs/day    Types: Cigarettes  . Smokeless tobacco: Not on file     Comment: 5 a day  . Alcohol Use: No  . Drug Use: No  . Sexually Active: Not on file   Other Topics Concern  . Not on file   Social History Narrative  . No narrative on file      ROS:  General: Negative for anorexia, fever, chills, fatigue, weakness. She history of   present illness Eyes: Negative for vision changes.  ENT: Negative for hoarseness,  nasal congestion. CV: Negative for chest pain, angina, palpitations, dyspnea on exertion, peripheral edema.  Respiratory: Negative for dyspnea at rest, dyspnea on exertion, cough, sputum, wheezing.  GI: See history of present illness. GU:  Negative for dysuria, hematuria, urinary incontinence, urinary frequency, nocturnal urination.  MS: Negative for joint pain. Chronic low back pain.  Derm: Negative for rash or itching.  Neuro: Negative for weakness, abnormal sensation, seizure, frequent headaches, memory loss, confusion.  Psych: Negative for anxiety, depression, suicidal ideation, hallucinations.  Endo: See history of present illness Heme: Negative for bruising or bleeding. Allergy: Negative for rash or hives.    Physical  Examination:  BP 139/85  Pulse 87  Temp(Src) 98.2 F (36.8 C) (Oral)  Ht 5' 2" (1.575 m)  Wt 127 lb 12.8 oz (57.97 kg)  BMI 23.37 kg/m2   General: Well-nourished, well-developed in no acute distress. Anxious Head: Normocephalic, atraumatic.   Eyes: Conjunctiva pink, no icterus. Mouth: Oropharyngeal mucosa moist and pink , no lesions erythema or exudate. Neck: Supple without thyromegaly, masses, or lymphadenopathy.  Lungs: Clear to auscultation bilaterally.  Heart: Regular rate and rhythm, no murmurs rubs or gallops.  Abdomen: Bowel sounds are normal, nontender, nondistended, no hepatosplenomegaly or masses, no abdominal bruits or    hernia , no rebound or guarding.   Rectal: Not performed Extremities: No lower extremity edema. No clubbing or deformities.  Neuro: Alert and oriented x 4 , grossly normal neurologically.  Skin: Warm and dry, no rash or jaundice.   Psych: Alert and cooperative, normal mood and affect.  Labs: Labs from April 2014. White blood cell count 10,200, hemoglobin 12.3, MCV 92.4, platelets 363,000, creatinine 0.62, calcium 8.5, total bilirubin 0.1, alkaline phosphatase 121, AST 18, ALT 15, albumin 3.9, hemoglobin A1c 5.6, TSH 4.38.  Imaging Studies: No results found.    

## 2012-09-25 ENCOUNTER — Encounter: Payer: Self-pay | Admitting: Internal Medicine

## 2012-09-25 ENCOUNTER — Encounter (HOSPITAL_COMMUNITY): Payer: Self-pay | Admitting: Internal Medicine

## 2012-09-26 LAB — CBC
Alkaline Phosphatase: 121 U/L
Creat: 0.62
HGB: 12.3 g/dL
Hgb A1c MFr Bld: 5.6 % (ref 4.0–6.0)
MCV: 92.4 fL
Total Bilirubin: 0.1 mg/dL
platelet count: 363

## 2012-12-11 ENCOUNTER — Telehealth: Payer: Self-pay | Admitting: Internal Medicine

## 2012-12-11 NOTE — Telephone Encounter (Signed)
Per RMR procedure note, pt should be taking nexium bid. Routing to refill box.

## 2012-12-11 NOTE — Telephone Encounter (Signed)
Pt called and spoke with Chelsey about changing her Nexium from 40 to 80 mg. She asked for this to be done before 4pm today. She uses [facility].  [phone]

## 2012-12-12 MED ORDER — ESOMEPRAZOLE MAGNESIUM 40 MG PO CPDR: 40.0000 mg | DELAYED_RELEASE_CAPSULE | Freq: Two times a day (BID) | ORAL | Status: DC

## 2012-12-12 NOTE — Addendum Note (Signed)
Addended by: Nira Retort on: 12/12/2012 04:35 PM   Modules accepted: Orders

## 2012-12-12 NOTE — Telephone Encounter (Signed)
Completed.

## 2013-03-15 ENCOUNTER — Other Ambulatory Visit: Payer: Self-pay

## 2014-05-18 ENCOUNTER — Emergency Department: Payer: Self-pay | Admitting: Emergency Medicine

## 2014-12-10 ENCOUNTER — Telehealth: Payer: Self-pay | Admitting: Internal Medicine

## 2014-12-10 NOTE — Telephone Encounter (Signed)
Letter mailed to pt.  

## 2014-12-10 NOTE — Telephone Encounter (Signed)
September RECALL FOR TCS °

## 2014-12-11 ENCOUNTER — Encounter: Payer: Self-pay | Admitting: Internal Medicine

## 2015-01-02 ENCOUNTER — Encounter: Payer: Self-pay | Admitting: Gastroenterology

## 2015-01-02 ENCOUNTER — Ambulatory Visit (INDEPENDENT_AMBULATORY_CARE_PROVIDER_SITE_OTHER): Payer: Medicare HMO | Admitting: Gastroenterology

## 2015-01-02 VITALS — BP 102/72 | HR 89 | Temp 97.3°F | Ht 62.0 in | Wt 104.2 lb

## 2015-01-02 DIAGNOSIS — R1314 Dysphagia, pharyngoesophageal phase: Secondary | ICD-10-CM | POA: Diagnosis not present

## 2015-01-02 DIAGNOSIS — K5900 Constipation, unspecified: Secondary | ICD-10-CM | POA: Diagnosis not present

## 2015-01-02 DIAGNOSIS — R1310 Dysphagia, unspecified: Secondary | ICD-10-CM

## 2015-01-02 DIAGNOSIS — K2100 Gastro-esophageal reflux disease with esophagitis, without bleeding: Secondary | ICD-10-CM

## 2015-01-02 DIAGNOSIS — R634 Abnormal weight loss: Secondary | ICD-10-CM

## 2015-01-02 DIAGNOSIS — R1319 Other dysphagia: Secondary | ICD-10-CM

## 2015-01-02 MED ORDER — LINACLOTIDE 290 MCG PO CAPS: 290.0000 ug | ORAL_CAPSULE | Freq: Every day | ORAL | Status: DC

## 2015-01-02 NOTE — Assessment & Plan Note (Signed)
Start Linzess 290 g daily for chronic constipation. Explained importance of beginning therapy prior colonoscopy to ensure adequate bowel preparation. Voucher for 30 day free supply provided. Warned of potential side effects including diarrhea. She will call if she's having any problems.

## 2015-01-02 NOTE — Progress Notes (Signed)
CC'ED TO PCP 

## 2015-01-02 NOTE — Assessment & Plan Note (Signed)
Significant weight loss. Plan on EGD for further evaluation. She is also due for colonoscopy, really void of any lower GI symptoms aside from constipation. Augment conscious sedation with Phenergan 25 mg IV 30 minutes before the procedure. Begin Linzess to ensure adequate bowel prep. She uses CPAP for sleep apnea, endo notified.  I have discussed the risks, alternatives, benefits with regards to but not limited to the risk of reaction to medication, bleeding, infection, perforation and the patient is agreeable to proceed. Written consent to be obtained.   May require CT imaging and chest xray for weight loss if endoscopic evaluation is unremarkable.

## 2015-01-02 NOTE — Progress Notes (Signed)
Primary Care Physician:  Ernestine Conrad, MD  Primary Gastroenterologist:  Roetta Sessions, MD   Chief Complaint  Patient presents with  . Dysphagia    HPI:  Lauren King is a 64 y.o. female here for further evaluation of esopahgeal dysphagia. She is also due for 10 year screening colonoscopy. Patient did not receive a reminder letter because she has a new address. Address has been updated.  2-3 months of esophageal dysphagia. Happens with bread, meat, liquids. Feels like something sticks in the throat region and she can't get it down or up. Denies any problems with pills. She notes her last EGD with dilation seemed to help a whole lot. Symptoms just recurred a couple months ago. She notes anorexia. Nausea with rare vomiting. Weight has dropped from 127 pounds May 2014 down to 104 pounds today. She has lost nearly 20% of her body weight in 2 years. She blames this on anxiety. Denies any abdominal pain. Heartburn controlled on Nexium twice daily. Bowel movement about once per week. Occasionally strains. No melena rectal bleeding.   EGD May 2014 with erosive reflux esophagitis, clear a short segment Barrett's esophagus but biopsy was unremarkable. Antral erosions and ulcerations with benign biopsies. 54 [place] Maloney dilator passed. Adequately sedated with Versed 6 mg, Demerol 125 mg, Phenergan 25 mg. Similar medications as she is on right now. Last colonoscopy in 2006, hyperplastic polyps.     Current Outpatient Prescriptions  Medication Sig Dispense Refill  . ALPRAZolam (XANAX) 1 MG tablet Take 1 mg by mouth at bedtime as needed for sleep.    [name] esomeprazole (NEXIUM) 40 MG capsule Take 1 capsule (40 mg total) by mouth 2 (two) times daily. 60 capsule 3  . HYDROcodone-acetaminophen (LORTAB) 7.5-500 MG per tablet Take 1 tablet by mouth every 6 (six) hours as needed for pain.    . pregabalin (LYRICA) 100 MG capsule Take 100 mg by mouth.    . QUEtiapine (SEROQUEL) 300 MG tablet Take 300 mg by mouth  at bedtime.    . sertraline (ZOLOFT) 100 MG tablet Take 100 mg by mouth daily.    . traZODone (DESYREL) 100 MG tablet Take 100 mg by mouth at bedtime. 1/2 to 11/2 as needed at bedtime.     No current facility-administered medications for this visit.    Allergies as of 01/02/2015  . (No Known Allergies)    Past Medical History  Diagnosis Date  . COPD (chronic obstructive pulmonary disease)   . Depression   . Anxiety   . Chronic lower back pain   . Diabetes mellitus type 2, diet-controlled   . Obstructive sleep apnea     CPAP  . GERD (gastroesophageal reflux disease)     Past Surgical History  Procedure Laterality Date  . Esophagogastroduodenoscopy  11/30/2004    RMR:A small patch of salmon-colored epithelium (salmon-like lesion in the distal esophagus)/Antral pre-pyloric erosions of uncertain significance, otherwise, normal. solitary duodenal bulbar AVM.No Barrett's.  . Colonoscopy  01/20/2005    [id number] colon polyp/Diminutive polyps at 30 cm ablated/Normal rectum. hyperplastic polyp  . Tubal ligation      with incidental appendectomy  . Abdominal hysterectomy    . Skin cancer destruction    . Esophagogastroduodenoscopy (egd) with esophageal dilation N/A 09/22/2012    Rourk: erosive RE, biopsy negative for Barrett's. Gastritis.     Family History  Problem Relation Age of Onset  . Colon cancer Neg Hx   . Lung cancer Sister   . Lung cancer Brother  Social History   Social History  . Marital Status: Divorced    Spouse Name: N/A  . Number of Children: N/A  . Years of Education: N/A   Occupational History  . Not on file.   Social History Main Topics  . Smoking status: Current Every Day Smoker -- 0.40 packs/day for 40 years    Types: Cigarettes  . Smokeless tobacco: Not on file     Comment: 5 a day  . Alcohol Use: No  . Drug Use: No  . Sexual Activity: Not on file   Other Topics Concern  . Not on file   Social History Narrative       ROS:  General: See history of present illness. No fever, chills, fatigue, weakness.  Eyes: Negative for vision changes.  ENT: Negative for hoarseness, nasal congestion. CV: Negative for chest pain, angina, palpitations, dyspnea on exertion, peripheral edema.  Respiratory: Negative for dyspnea at rest, dyspnea on exertion, cough, sputum, wheezing.  GI: See history of present illness. GU:  Negative for dysuria, hematuria, urinary incontinence, urinary frequency, nocturnal urination.  MS: Negative for joint pain, low back pain.  Derm: Negative for rash or itching.  Neuro: Negative for weakness, abnormal sensation, seizure, frequent headaches, memory loss, confusion.  Psych: Positive anxiety. Positive depression. No suicidal ideation, hallucinations.  Endo: Negative for unusual weight change.  Heme: Negative for bruising or bleeding. Allergy: Negative for rash or hives.    Physical Examination:  BP 102/72 mmHg  Pulse 89  Temp(Src) 97.3 F (36.3 C) (Oral)  Ht 5\' 2"  (1.575 m)  Wt 104 lb 3.2 oz (47.265 kg)  BMI 19.05 kg/m2   General: Well-nourished, well-developed in no acute distress. Small stature. Head: Normocephalic, atraumatic.   Eyes: Conjunctiva pink, no icterus. Mouth: Oropharyngeal mucosa moist and pink , no lesions erythema or exudate. Poor dentition Neck: Supple without thyromegaly, masses, or lymphadenopathy.  Lungs: Clear to auscultation bilaterally.  Heart: Regular rate and rhythm, no murmurs rubs or gallops.  Abdomen: Bowel sounds are normal, questionable epigastric tenderness although patient denies, nondistended, no hepatosplenomegaly or masses, no abdominal bruits or    hernia , no rebound or guarding.   Rectal: Not performed Extremities: No lower extremity edema. No clubbing or deformities.  Neuro: Alert and oriented x 4 , grossly normal neurologically.  Skin: Warm and dry, no rash or jaundice.   Psych: Alert and cooperative, normal mood and  affect.  Labs: Labs reviewed from outside source dated January 2016 White blood cell count 8300, hemoglobin 12.3, hematocrit 37.8, platelets 371,000, BUN 17, creatinine 0.77, total bilirubin 0.1, alkaline phosphatase 99, AST 15, ALT 13, albumen 3.4, hemoglobin A1c 5.8.  Imaging Studies: No results found.

## 2015-01-02 NOTE — Patient Instructions (Signed)
1. Start Linzess one daily on empty stomach for constipation. May cause loose stools at first but should improve. If significant diarrhea, hold medication and call us. TAKE VOUCHER TO PHARMACY TO RECEIVE 30 DAYS FREE. 2. Colonoscopy and upper endoscopy as scheduled. See separate instructions.

## 2015-01-02 NOTE — Assessment & Plan Note (Signed)
64 year old female with history of chronic GERD, erosive reflux esophagitis who presents with recurrent esophageal dysphagia. Last EGD with dilation 2 years ago at which time she had erosive reflux esophagitis, antral erosions and ulcerations felt to be NSAID related. She reports esophageal dilation helped her tremendously. She reports recurrent symptoms at this time. Would be reasonable to offer repeat EGD with dilation in the near future. She also has significant abnormal weight loss and questionable epigastric tenderness on exam although patient would not admit.  I have discussed the risks, alternatives, benefits with regards to but not limited to the risk of reaction to medication, bleeding, infection, perforation and the patient is agreeable to proceed. Written consent to be obtained.  Previously did well with conscious sedation augmented with Phenergan 25 mg IV 30 minutes before the procedure. Plan going this route again as patient has similar medication profile. She does not take pain medication on a regular basis and hasn't had any for months. Does not take Xanax every day.

## 2015-01-21 ENCOUNTER — Other Ambulatory Visit: Payer: Self-pay

## 2015-01-21 ENCOUNTER — Telehealth: Payer: Self-pay

## 2015-01-21 MED ORDER — PEG 3350-KCL-NA BICARB-NACL 420 G PO SOLR
4000.0000 mL | ORAL | Status: DC
Start: 2015-01-21 — End: 2015-03-12

## 2015-01-21 NOTE — Telephone Encounter (Signed)
Pt called today to schedule her TCS/EGD/ED for her office visit that was on 12/31/14. Her 30 days are up on 01/29/15, RMR does not have anything before her time is up. We are going to have to bring her back in for an office visit.

## 2015-01-22 NOTE — Telephone Encounter (Signed)
Pt had to set up trans-portion and she was going to call us back but did not call back until this week.

## 2015-01-22 NOTE — Telephone Encounter (Signed)
That's unfortunate. Please schedule her an office visit.

## 2015-01-22 NOTE — Telephone Encounter (Signed)
Do we know why she was not scheduled the day of her OV?

## 2015-01-23 NOTE — Telephone Encounter (Signed)
Pt has an appointment on 02/03/15 with EG

## 2015-01-29 ENCOUNTER — Telehealth: Payer: Self-pay | Admitting: Internal Medicine

## 2015-01-29 NOTE — Telephone Encounter (Signed)
Talked with patient and she is aware that the TCS was canceled on 10/5 and that she will need to come in for her OV on 02/06/15

## 2015-01-29 NOTE — Telephone Encounter (Signed)
Pt called about her TriLyte prep and said she is scheduled for a colonoscopy on 10/5.  She has an OV on 9/29 to be set up for a colonoscopy. Patient said if she could take Linzess she wouldn't need to drink "that nasty stuff".  Please advise if she has already been scheduled for a procedure or if I need to cancel this OV.  Patient would like a return call before 130 before she has to go out to [place]. [phone]

## 2015-02-06 ENCOUNTER — Other Ambulatory Visit: Payer: Self-pay

## 2015-02-06 ENCOUNTER — Encounter: Payer: Self-pay | Admitting: Nurse Practitioner

## 2015-02-06 ENCOUNTER — Ambulatory Visit (INDEPENDENT_AMBULATORY_CARE_PROVIDER_SITE_OTHER): Payer: Medicare HMO | Admitting: Nurse Practitioner

## 2015-02-06 VITALS — BP 125/76 | HR 43 | Temp 97.8°F | Ht 61.0 in | Wt 103.4 lb

## 2015-02-06 DIAGNOSIS — K5900 Constipation, unspecified: Secondary | ICD-10-CM | POA: Diagnosis not present

## 2015-02-06 DIAGNOSIS — Z1211 Encounter for screening for malignant neoplasm of colon: Secondary | ICD-10-CM

## 2015-02-06 NOTE — Assessment & Plan Note (Signed)
Symptoms resolved on Linzess. Recommend continue Linzess. Return for follow-up as needed for any recurrent or worsening symptoms.

## 2015-02-06 NOTE — Assessment & Plan Note (Signed)
Patient due for 10 year screening colonoscopy. Last procedure was an EGD in 2014 on the same medication she's currently on and she did well with conscious sedation with 25 mg IV Phenergan. At this point we will move forward with scheduling her screening colonoscopy.  Proceed with TCS with phenergan  IV pre-procedure with Dr. Jena Gauss in near future: the risks, benefits, and alternatives have been discussed with the patient in detail. The patient states understanding and desires to proceed.  Patient is not on any anticoagulants. She is on Xanax once a day, Lortab 1-2 times a day, and a couple antidepressants. Last procedure tolerated well with conscious sedation with 25 mg IV Phenergan and we will plan to repeat this for this procedure to ensure adequate sedation.

## 2015-02-06 NOTE — Progress Notes (Signed)
Referring Provider: Ernestine Conrad, MD Primary Care Physician:  Ernestine Conrad, MD Primary GI:  Dr. Jena Gauss  Chief Complaint  Patient presents with  . set up TCS/EGD    HPI:   64 year old female presents to reschedule colonoscopy. She last seen in our office on 01/02/2015 and noted due for colonoscopy. By the time she called back to schedule there is no openings available within 30 days and she was subsequently scheduled for a return office visit to schedule your procedure.  Today she states she's doing about the same as last visit. Her constipation is resolved with Linzess. Denies abdominal pain, N/V, hematochezia, melena, fever, chills, unintentional weight loss. Denies chest pain, dyspnea, dizziness, lightheadedness, syncope, near syncope. Denies any other upper or lower GI symptoms.  Past Medical History  Diagnosis Date  . COPD (chronic obstructive pulmonary disease)   . Depression   . Anxiety   . Chronic lower back pain   . Diabetes mellitus type 2, diet-controlled   . Obstructive sleep apnea     CPAP  . GERD (gastroesophageal reflux disease)     Past Surgical History  Procedure Laterality Date  . Esophagogastroduodenoscopy  11/30/2004    RMR:A small patch of salmon-colored epithelium (salmon-like lesion in the distal esophagus)/Antral pre-pyloric erosions of uncertain significance, otherwise, normal. solitary duodenal bulbar AVM.No Barrett's.  . Colonoscopy  01/20/2005    [id number] colon polyp/Diminutive polyps at 30 cm ablated/Normal rectum. hyperplastic polyp  . Tubal ligation      with incidental appendectomy  . Abdominal hysterectomy    . Skin cancer destruction    . Esophagogastroduodenoscopy (egd) with esophageal dilation N/A 09/22/2012    Rourk: erosive RE, biopsy negative for Barrett's. Gastritis.     Current Outpatient Prescriptions  Medication Sig Dispense Refill  . ALPRAZolam (XANAX) 1 MG tablet Take 1 mg by mouth at bedtime as needed for sleep.    [name]  esomeprazole (NEXIUM) 40 MG capsule Take 1 capsule (40 mg total) by mouth 2 (two) times daily. 60 capsule 3  . HYDROcodone-acetaminophen (LORTAB) 7.5-500 MG per tablet Take 1 tablet by mouth every 6 (six) hours as needed for pain.    . Linaclotide (LINZESS) 290 MCG CAPS capsule Take 1 capsule (290 mcg total) by mouth daily. 30 capsule 1  . pregabalin (LYRICA) 100 MG capsule Take 100 mg by mouth.    . QUEtiapine (SEROQUEL) 300 MG tablet Take 300 mg by mouth at bedtime.    . sertraline (ZOLOFT) 100 MG tablet Take 100 mg by mouth daily.    . traZODone (DESYREL) 100 MG tablet Take 100 mg by mouth at bedtime. 1/2 to 11/2 as needed at bedtime.    . polyethylene glycol-electrolytes (TRILYTE) 420 G solution Take 4,000 mLs by mouth as directed. (Patient not taking: Reported on 02/06/2015) 4000 mL 0   No current facility-administered medications for this visit.    Allergies as of 02/06/2015  . (No Known Allergies)    Family History  Problem Relation Age of Onset  . Colon cancer Neg Hx   . Lung cancer Sister   . Lung cancer Brother     Social History   Social History  . Marital Status: Divorced    Spouse Name: N/A  . Number of Children: N/A  . Years of Education: N/A   Social History Main Topics  . Smoking status: Current Every Day Smoker -- 0.40 packs/day for 40 years    Types: Cigarettes  . Smokeless tobacco: None  Comment: 5 a day  . Alcohol Use: No  . Drug Use: No  . Sexual Activity: Not Asked   Other Topics Concern  . None   Social History Narrative    Review of Systems: General: Negative for anorexia, weight loss, fever, chills, fatigue, weakness. ENT: Negative for hoarseness, difficulty swallowing. CV: Negative for chest pain, angina, palpitations, dyspnea on exertion, peripheral edema.  Respiratory: Negative for dyspnea at rest, dyspnea on exertion, cough, sputum, wheezing.  GI: See history of present illness. Endo: Negative for unusual weight change.    Physical  Exam: BP 125/76 mmHg  Pulse 43  Temp(Src) 97.8 F (36.6 C)  Ht  (1.549 m)  Wt 103 lb 6.4 oz (46.902 kg)  BMI 19.55 kg/m2 General:   Alert and oriented. Pleasant and cooperative. Well-nourished and well-developed.  Head:  Normocephalic and atraumatic. Cardiovascular:  S1, S2 present without murmurs appreciated. Normal pulses noted. Extremities without clubbing or edema. Respiratory:  Clear to auscultation bilaterally. No wheezes, rales, or rhonchi. No distress.  Gastrointestinal:  +BS, soft, non-tender and non-distended. No HSM noted. No guarding or rebound. No masses appreciated.  Rectal:  Deferred  Neurologic:  Alert and oriented x4;  grossly normal neurologically. Psych:  Alert and cooperative. Normal mood and affect.    02/06/2015 10:47 AM

## 2015-02-06 NOTE — Patient Instructions (Signed)
1. We will schedule your procedure for you. 2. Continue taking her Linzess. Constipation. 3. Return for follow-up as needed. 4. Further recommendations to be based on results your procedure.

## 2015-02-06 NOTE — Progress Notes (Signed)
CC'D TO PCP °

## 2015-02-18 ENCOUNTER — Encounter (HOSPITAL_COMMUNITY): Admission: RE | Payer: Self-pay | Source: Ambulatory Visit

## 2015-02-18 ENCOUNTER — Ambulatory Visit (HOSPITAL_COMMUNITY): Admission: RE | Admit: 2015-02-18 | Payer: Medicare HMO | Source: Ambulatory Visit | Admitting: Internal Medicine

## 2015-02-18 SURGERY — COLONOSCOPY
Anesthesia: Moderate Sedation

## 2015-03-12 ENCOUNTER — Other Ambulatory Visit: Payer: Self-pay

## 2015-03-12 ENCOUNTER — Encounter: Payer: Self-pay | Admitting: Nurse Practitioner

## 2015-03-12 ENCOUNTER — Ambulatory Visit (INDEPENDENT_AMBULATORY_CARE_PROVIDER_SITE_OTHER): Payer: Medicare HMO | Admitting: Nurse Practitioner

## 2015-03-12 VITALS — BP 105/64 | HR 73 | Temp 97.1°F | Ht 61.0 in | Wt 111.4 lb

## 2015-03-12 DIAGNOSIS — R1310 Dysphagia, unspecified: Secondary | ICD-10-CM

## 2015-03-12 DIAGNOSIS — R1314 Dysphagia, pharyngoesophageal phase: Secondary | ICD-10-CM

## 2015-03-12 DIAGNOSIS — Z1211 Encounter for screening for malignant neoplasm of colon: Secondary | ICD-10-CM

## 2015-03-12 DIAGNOSIS — R1319 Other dysphagia: Secondary | ICD-10-CM

## 2015-03-12 MED ORDER — PEG 3350-KCL-NA BICARB-NACL 420 G PO SOLR: 4000.0000 mL | ORAL | Status: DC

## 2015-03-12 NOTE — Assessment & Plan Note (Signed)
Patient due for screening colonoscopy, had to cancel her previously scheduled appointment for her procedure because her daughter broke her ankle. She is somewhat reluctant to have a colonoscopy done because she does not want to go through the prep. However I educated her on the reasons for colonoscopy and routine screening of colon cancer. She states that her daughter is also insisting that she hasn't done and so she agrees to move forward. She generally asymptomatic from a GI standpoint related to her lower GI system. She is having some dysphagia as noted above. We'll proceed with colonoscopy and endoscopy with possible dilation.  Proceed with TCS and EGD +/- dilation with 25 mg pre-procedure Phenergan with Dr. Jena Gaussourk in near future: the risks, benefits, and alternatives have been discussed with the patient in detail. The patient states understanding and desires to proceed.  The patient is not on any anticoagulants. She is on Xanax, Lortab, Lyrica, Seroquel, Zoloft, Desyrel, tramadol. Previous procedure was completed adequately with conscious sedation and 25 mg of Phenergan. At this point we'll proceed in similar fashion to promote adequate sedation.

## 2015-03-12 NOTE — Progress Notes (Signed)
Referring Provider: Ernestine Conrad, MD Primary Care Physician:  Ernestine Conrad, MD Primary GI:  Dr. Jena Gauss  Chief Complaint  Patient presents with  . EGD  . Colonoscopy    HPI:   64 year old female presents to schedule colonoscopy. She was last seen in our office 02/06/2015 to schedule colonoscopy which was tentatively scheduled for 02/18/2015. However she had to cancel because her daughter (who was going to take her to her procedure) broke her ankle.  Today she states she's doing the same. Some constipation continues, Linzess givers her diarrhea and hasn't been taking it. She generally is able to manage her constipation well with daily Miralax, sometimes forgets to take it. She is hesitant to do the colonoscopy because of prep but agrees after education and explanation ("and my daughter is insisting I have it done.") Also complains of dysphagia at times which has been progressively more frequent and would like an endoscopy to evaluate. Her last EGD in 2014 shows suspicion for short segment Barrett's but path report of biopsy showed mild inflammation consistent with GERD, in intestinal metaplasis, dyspkasia, or malignancy identified. Denies abdominal pain, N/V, hematochezia, melena, fever, chills, unintentional weight loss. Denies chest pain, dyspnea, dizziness, lightheadedness, syncope, near syncope. Denies any other upper or lower GI symptoms.  Past Medical History  Diagnosis Date  . COPD (chronic obstructive pulmonary disease) (HCC)   . Depression   . Anxiety   . Chronic lower back pain   . Diabetes mellitus type 2, diet-controlled (HCC)   . Obstructive sleep apnea     CPAP  . GERD (gastroesophageal reflux disease)     Past Surgical History  Procedure Laterality Date  . Esophagogastroduodenoscopy  11/30/2004    RMR:A small patch of salmon-colored epithelium (salmon-like lesion in the distal esophagus)/Antral pre-pyloric erosions of uncertain significance, otherwise, normal. solitary  duodenal bulbar AVM.No Barrett's.  . Colonoscopy  01/20/2005    [id number] colon polyp/Diminutive polyps at 30 cm ablated/Normal rectum. hyperplastic polyp  . Tubal ligation      with incidental appendectomy  . Abdominal hysterectomy    . Skin cancer destruction    . Esophagogastroduodenoscopy (egd) with esophageal dilation N/A 09/22/2012    Rourk: erosive RE, biopsy negative for Barrett's. Gastritis.     Current Outpatient Prescriptions  Medication Sig Dispense Refill  . ALPRAZolam (XANAX) 1 MG tablet Take 1 mg by mouth at bedtime as needed for sleep.    [name] esomeprazole (NEXIUM) 40 MG capsule Take 1 capsule (40 mg total) by mouth 2 (two) times daily. 60 capsule 3  . HYDROcodone-acetaminophen (LORTAB) 7.5-500 MG per tablet Take 1 tablet by mouth every 6 (six) hours as needed for pain.    . Linaclotide (LINZESS) 290 MCG CAPS capsule Take 1 capsule (290 mcg total) by mouth daily. 30 capsule 1  . polyethylene glycol-electrolytes (TRILYTE) 420 G solution Take 4,000 mLs by mouth as directed. (Patient not taking: Reported on 03/12/2015) 4000 mL 0  . pregabalin (LYRICA) 100 MG capsule Take 100 mg by mouth.    . QUEtiapine (SEROQUEL) 300 MG tablet Take 300 mg by mouth at bedtime.    . sertraline (ZOLOFT) 100 MG tablet Take 100 mg by mouth daily.    [name] tiZANidine (ZANAFLEX) 4 MG tablet     . traMADol (ULTRAM) 50 MG tablet     . traZODone (DESYREL) 100 MG tablet Take 100 mg by mouth at bedtime. 1/2 to 11/2 as needed at bedtime.     No current facility-administered medications for this  visit.    Allergies as of 03/12/2015  . (No Known Allergies)    Family History  Problem Relation Age of Onset  . Colon cancer Neg Hx   . Lung cancer Sister   . Lung cancer Brother     Social History   Social History  . Marital Status: Divorced    Spouse Name: N/A  . Number of Children: N/A  . Years of Education: N/A   Social History Main Topics  . Smoking status: Current Every Day Smoker -- 0.40  packs/day for 40 years    Types: Cigarettes  . Smokeless tobacco: None     Comment: 5 a day  . Alcohol Use: No  . Drug Use: No  . Sexual Activity: Not Asked   Other Topics Concern  . None   Social History Narrative    Review of Systems: General: Negative for anorexia, weight loss, fever, chills, fatigue, weakness. CV: Negative for chest pain, angina, palpitations, peripheral edema.  Respiratory: Negative for dyspnea at rest, cough, sputum, wheezing.  GI: See history of present illness. Endo: Negative for unusual weight change.    Physical Exam: BP 105/64 mmHg  Pulse 73  Temp(Src) 97.1 F (36.2 C) (Oral)  Ht 5\' 1"  (1.549 m)  Wt 111 lb 6.4 oz (50.531 kg)  BMI 21.06 kg/m2 General:   Alert and oriented. Pleasant and cooperative. Well-nourished and well-developed.  Head:  Normocephalic and atraumatic. Ears:  Normal auditory acuity. Cardiovascular:  S1, S2 present without murmurs appreciated. Extremities without clubbing or edema. Respiratory:  Clear to auscultation bilaterally. No wheezes, rales, or rhonchi. No distress.  Gastrointestinal:  +BS, soft, non-tender and non-distended. No HSM noted. No guarding or rebound. No masses appreciated.  Rectal:  Deferred  Neurologic:  Alert and oriented x4;  grossly normal neurologically. Psych:  Alert and cooperative. Normal mood and affect.    03/12/2015 11:10 AM

## 2015-03-12 NOTE — Patient Instructions (Signed)
1. We will schedule your procedures for you. 2. Further recommendations to be based on the results of your procedures.

## 2015-03-12 NOTE — Assessment & Plan Note (Signed)
Patient with dysphagia symptoms are increasingly regular. States food gets stuck, no real regurgitation and eventually passes. Her last endoscopy in 2014 showed her esophagus looked suspicious for possible short segment Barrett's, however surgical pathology showed mild irritation consistent with GERD. Cannot rule out stricture, web, ring or GERD related dysmotility. We'll proceed with an EGD added onto her due colonoscopy as noted below.

## 2015-03-13 NOTE — Progress Notes (Signed)
cc'ed to pcp °

## 2015-03-25 ENCOUNTER — Ambulatory Visit (HOSPITAL_COMMUNITY)
Admission: RE | Admit: 2015-03-25 | Discharge: 2015-03-25 | Disposition: A | Discharge status: Home / Self Care | Payer: Medicare HMO | Source: Ambulatory Visit | Attending: Internal Medicine | Admitting: Internal Medicine

## 2015-03-25 ENCOUNTER — Encounter (HOSPITAL_COMMUNITY): Payer: Self-pay | Admitting: *Deleted

## 2015-03-25 ENCOUNTER — Encounter (HOSPITAL_COMMUNITY)
Admission: RE | Disposition: A | Discharge status: Home / Self Care | Payer: Self-pay | Source: Ambulatory Visit | Attending: Internal Medicine

## 2015-03-25 DIAGNOSIS — F329 Major depressive disorder, single episode, unspecified: Secondary | ICD-10-CM | POA: Insufficient documentation

## 2015-03-25 DIAGNOSIS — M545 Low back pain: Secondary | ICD-10-CM | POA: Insufficient documentation

## 2015-03-25 DIAGNOSIS — G4733 Obstructive sleep apnea (adult) (pediatric): Secondary | ICD-10-CM | POA: Diagnosis not present

## 2015-03-25 DIAGNOSIS — K222 Esophageal obstruction: Secondary | ICD-10-CM | POA: Insufficient documentation

## 2015-03-25 DIAGNOSIS — K259 Gastric ulcer, unspecified as acute or chronic, without hemorrhage or perforation: Secondary | ICD-10-CM

## 2015-03-25 DIAGNOSIS — Z1211 Encounter for screening for malignant neoplasm of colon: Secondary | ICD-10-CM | POA: Diagnosis present

## 2015-03-25 DIAGNOSIS — Z86010 Personal history of colonic polyps: Secondary | ICD-10-CM | POA: Diagnosis not present

## 2015-03-25 DIAGNOSIS — R1310 Dysphagia, unspecified: Secondary | ICD-10-CM

## 2015-03-25 DIAGNOSIS — F419 Anxiety disorder, unspecified: Secondary | ICD-10-CM | POA: Diagnosis not present

## 2015-03-25 DIAGNOSIS — E119 Type 2 diabetes mellitus without complications: Secondary | ICD-10-CM | POA: Diagnosis not present

## 2015-03-25 DIAGNOSIS — K2100 Gastro-esophageal reflux disease with esophagitis, without bleeding: Secondary | ICD-10-CM | POA: Insufficient documentation

## 2015-03-25 DIAGNOSIS — F17210 Nicotine dependence, cigarettes, uncomplicated: Secondary | ICD-10-CM | POA: Diagnosis not present

## 2015-03-25 DIAGNOSIS — K3189 Other diseases of stomach and duodenum: Secondary | ICD-10-CM | POA: Insufficient documentation

## 2015-03-25 DIAGNOSIS — Q394 Esophageal web: Secondary | ICD-10-CM

## 2015-03-25 DIAGNOSIS — K5730 Diverticulosis of large intestine without perforation or abscess without bleeding: Secondary | ICD-10-CM

## 2015-03-25 DIAGNOSIS — K449 Diaphragmatic hernia without obstruction or gangrene: Secondary | ICD-10-CM

## 2015-03-25 DIAGNOSIS — G8929 Other chronic pain: Secondary | ICD-10-CM | POA: Insufficient documentation

## 2015-03-25 DIAGNOSIS — J449 Chronic obstructive pulmonary disease, unspecified: Secondary | ICD-10-CM | POA: Diagnosis not present

## 2015-03-25 HISTORY — PX: COLONOSCOPY: SHX5424

## 2015-03-25 HISTORY — PX: MALONEY DILATION: SHX5535

## 2015-03-25 HISTORY — PX: ESOPHAGOGASTRODUODENOSCOPY: SHX5428

## 2015-03-25 SURGERY — COLONOSCOPY
Anesthesia: Moderate Sedation

## 2015-03-25 MED ORDER — LIDOCAINE VISCOUS 2 % MT SOLN
OROMUCOSAL | Status: AC
  Filled 2015-03-25: qty 15

## 2015-03-25 MED ORDER — MEPERIDINE HCL 100 MG/ML IJ SOLN
INTRAMUSCULAR | Status: DC | PRN
  Administered 2015-03-25: 50 mg via INTRAVENOUS
  Administered 2015-03-25 (×3): 25 mg via INTRAVENOUS
  Administered 2015-03-25: 50 mg via INTRAVENOUS

## 2015-03-25 MED ORDER — ONDANSETRON HCL 4 MG/2ML IJ SOLN
INTRAMUSCULAR | Status: DC | PRN
  Administered 2015-03-25: 4 mg via INTRAVENOUS

## 2015-03-25 MED ORDER — SODIUM CHLORIDE 0.9 % IJ SOLN
INTRAMUSCULAR | Status: AC
  Filled 2015-03-25: qty 3

## 2015-03-25 MED ORDER — PROMETHAZINE HCL 25 MG/ML IJ SOLN
INTRAMUSCULAR | Status: AC
  Filled 2015-03-25: qty 1

## 2015-03-25 MED ORDER — SODIUM CHLORIDE 0.9 % IV SOLN
INTRAVENOUS | Status: DC
  Administered 2015-03-25: 13:00:00 via INTRAVENOUS

## 2015-03-25 MED ORDER — ONDANSETRON HCL 4 MG/2ML IJ SOLN
INTRAMUSCULAR | Status: AC
  Filled 2015-03-25: qty 2

## 2015-03-25 MED ORDER — PROMETHAZINE HCL 25 MG/ML IJ SOLN
12.5000 mg | Freq: Once | INTRAMUSCULAR | Status: AC
  Administered 2015-03-25: 12.5 mg via INTRAVENOUS

## 2015-03-25 MED ORDER — MIDAZOLAM HCL 5 MG/5ML IJ SOLN
INTRAMUSCULAR | Status: AC
  Filled 2015-03-25: qty 10

## 2015-03-25 MED ORDER — LIDOCAINE VISCOUS 2 % MT SOLN
OROMUCOSAL | Status: DC | PRN
  Administered 2015-03-25: 6 mL via OROMUCOSAL

## 2015-03-25 MED ORDER — MEPERIDINE HCL 100 MG/ML IJ SOLN
INTRAMUSCULAR | Status: AC
  Filled 2015-03-25: qty 2

## 2015-03-25 MED ORDER — MIDAZOLAM HCL 5 MG/5ML IJ SOLN
INTRAMUSCULAR | Status: DC | PRN
  Administered 2015-03-25: 2 mg via INTRAVENOUS
  Administered 2015-03-25 (×2): 1 mg via INTRAVENOUS
  Administered 2015-03-25: 2 mg via INTRAVENOUS
  Administered 2015-03-25 (×2): 1 mg via INTRAVENOUS

## 2015-03-25 NOTE — Op Note (Signed)
[facility] [address] [place], [id number]   COLONOSCOPY PROCEDURE REPORT  PATIENT: Lauren King, Lauren King  MR#: [id number] BIRTHDATE: 09/01/1950 , 64  yrs. old GENDER: female ENDOSCOPIST: R.  Roetta SessionsMichael Vera Furniss, MD FACP [facility] REFERRED GE:XBMWBY:Lauren King, M.D. PROCEDURE DATE:  03/25/2015 PROCEDURE:   Colonoscopy, screening INDICATIONS:Average risk colorectal cancer screening examination. MEDICATIONS: Versed 8 mg IV.  Demerol 175 mg IV in divided doses. Phenergan 12.5 mg IV.  Zofran 4 mg IV. ASA CLASS:       Class II  CONSENT: The risks, benefits, alternatives and imponderables including but not limited to bleeding, perforation as well as the possibility of a missed lesion have been reviewed.  The potential for biopsy, lesion removal, etc. have also been discussed. Questions have been answered.  All parties agreeable.  Please see the history and physical in the medical record for more information.  DESCRIPTION OF PROCEDURE:   After the risks benefits and alternatives of the procedure were thoroughly explained, informed consent was obtained.  The digital rectal exam revealed no abnormalities of the rectum.   The [id number] ([id number]  endoscope was introduced through the anus and advanced to the cecum, which was identified by both the appendix and ileocecal valve. No adverse events experienced.   The quality of the prep was adequate  The instrument was then slowly withdrawn as the colon was fully examined. Estimated blood loss is zero unless otherwise noted in this procedure report.      COLON FINDINGS: Normal-appearing rectal mucosa.  Scattered left-sided diverticula; the remainder of the colonic mucosa appeared normal.  Retroflexion was performed. .  Withdrawal time=7 minutes 0 seconds.  The scope was withdrawn and the procedure completed. COMPLICATIONS: There were no immediate complications.  ENDOSCOPIC IMPRESSION: Colonic diverticulosis.  RECOMMENDATIONS: Would  consider one more screening colonoscopy in 10 years if overall health permits.  See EGD report.  eSigned:  R. Roetta SessionsMichael Kathelene Rumberger, MD Jerrel IvoryFACP [facility] 03/25/2015 2:02 PM   cc:  CPT CODES: ICD CODES:  The ICD and CPT codes recommended by this software are interpretations from the data that the clinical staff has captured with the software.  The verification of the translation of this report to the ICD and CPT codes and modifiers is the sole responsibility of the health care institution and practicing physician where this report was generated.  [employer] will not be held responsible for the validity of the ICD and CPT codes included on this report.  AMA assumes no liability for data contained or not contained herein. CPT is a Publishing rights managerregistered trademark of the [employer].

## 2015-03-25 NOTE — H&P (View-Only) (Signed)
  Referring Provider: Bluth, Kirk, MD Primary Care Physician:  BLUTH, KIRK, MD Primary GI:  Dr. Rourk  Chief Complaint  Patient presents with  . EGD  . Colonoscopy    HPI:   64-year-old female presents to schedule colonoscopy. She was last seen in our office 02/06/2015 to schedule colonoscopy which was tentatively scheduled for 02/18/2015. However she had to cancel because her daughter (who was going to take her to her procedure) broke her ankle.  Today she states she's doing the same. Some constipation continues, Linzess givers her diarrhea and hasn't been taking it. She generally is able to manage her constipation well with daily Miralax, sometimes forgets to take it. She is hesitant to do the colonoscopy because of prep but agrees after education and explanation ("and my daughter is insisting I have it done.") Also complains of dysphagia at times which has been progressively more frequent and would like an endoscopy to evaluate. Her last EGD in 2014 shows suspicion for short segment Barrett's but path report of biopsy showed mild inflammation consistent with GERD, in intestinal metaplasis, dyspkasia, or malignancy identified. Denies abdominal pain, N/V, hematochezia, melena, fever, chills, unintentional weight loss. Denies chest pain, dyspnea, dizziness, lightheadedness, syncope, near syncope. Denies any other upper or lower GI symptoms.  Past Medical History  Diagnosis Date  . COPD (chronic obstructive pulmonary disease) (HCC)   . Depression   . Anxiety   . Chronic lower back pain   . Diabetes mellitus type 2, diet-controlled (HCC)   . Obstructive sleep apnea     CPAP  . GERD (gastroesophageal reflux disease)     Past Surgical History  Procedure Laterality Date  . Esophagogastroduodenoscopy  11/30/2004    RMR:A small patch of salmon-colored epithelium (salmon-like lesion in the distal esophagus)/Antral pre-pyloric erosions of uncertain significance, otherwise, normal. solitary  duodenal bulbar AVM.No Barrett's.  . Colonoscopy  01/20/2005    RMR:Ascending colon polyp/Diminutive polyps at 30 cm ablated/Normal rectum. hyperplastic polyp  . Tubal ligation      with incidental appendectomy  . Abdominal hysterectomy    . Skin cancer destruction    . Esophagogastroduodenoscopy (egd) with esophageal dilation N/A 09/22/2012    Rourk: erosive RE, biopsy negative for Barrett's. Gastritis.     Current Outpatient Prescriptions  Medication Sig Dispense Refill  . ALPRAZolam (XANAX) 1 MG tablet Take 1 mg by mouth at bedtime as needed for sleep.    . esomeprazole (NEXIUM) 40 MG capsule Take 1 capsule (40 mg total) by mouth 2 (two) times daily. 60 capsule 3  . HYDROcodone-acetaminophen (LORTAB) 7.5-500 MG per tablet Take 1 tablet by mouth every 6 (six) hours as needed for pain.    . Linaclotide (LINZESS) 290 MCG CAPS capsule Take 1 capsule (290 mcg total) by mouth daily. 30 capsule 1  . polyethylene glycol-electrolytes (TRILYTE) 420 G solution Take 4,000 mLs by mouth as directed. (Patient not taking: Reported on 03/12/2015) 4000 mL 0  . pregabalin (LYRICA) 100 MG capsule Take 100 mg by mouth.    . QUEtiapine (SEROQUEL) 300 MG tablet Take 300 mg by mouth at bedtime.    . sertraline (ZOLOFT) 100 MG tablet Take 100 mg by mouth daily.    . tiZANidine (ZANAFLEX) 4 MG tablet     . traMADol (ULTRAM) 50 MG tablet     . traZODone (DESYREL) 100 MG tablet Take 100 mg by mouth at bedtime. 1/2 to 11/2 as needed at bedtime.     No current facility-administered medications for this   visit.    Allergies as of 03/12/2015  . (No Known Allergies)    Family History  Problem Relation Age of Onset  . Colon cancer Neg Hx   . Lung cancer Sister   . Lung cancer Brother     Social History   Social History  . Marital Status: Divorced    Spouse Name: N/A  . Number of Children: N/A  . Years of Education: N/A   Social History Main Topics  . Smoking status: Current Every Day Smoker -- 0.40  packs/day for 40 years    Types: Cigarettes  . Smokeless tobacco: None     Comment: 5 a day  . Alcohol Use: No  . Drug Use: No  . Sexual Activity: Not Asked   Other Topics Concern  . None   Social History Narrative    Review of Systems: General: Negative for anorexia, weight loss, fever, chills, fatigue, weakness. CV: Negative for chest pain, angina, palpitations, peripheral edema.  Respiratory: Negative for dyspnea at rest, cough, sputum, wheezing.  GI: See history of present illness. Endo: Negative for unusual weight change.    Physical Exam: BP 105/64 mmHg  Pulse 73  Temp(Src) 97.1 F (36.2 C) (Oral)  Ht 5\' 1"  (1.549 m)  Wt 111 lb 6.4 oz (50.531 kg)  BMI 21.06 kg/m2 General:   Alert and oriented. Pleasant and cooperative. Well-nourished and well-developed.  Head:  Normocephalic and atraumatic. Ears:  Normal auditory acuity. Cardiovascular:  S1, S2 present without murmurs appreciated. Extremities without clubbing or edema. Respiratory:  Clear to auscultation bilaterally. No wheezes, rales, or rhonchi. No distress.  Gastrointestinal:  +BS, soft, non-tender and non-distended. No HSM noted. No guarding or rebound. No masses appreciated.  Rectal:  Deferred  Neurologic:  Alert and oriented x4;  grossly normal neurologically. Psych:  Alert and cooperative. Normal mood and affect.    03/12/2015 11:10 AM

## 2015-03-25 NOTE — Interval H&P Note (Signed)
History and Physical Interval Note:  03/25/2015 12:56 PM  Lauren King  has presented today for surgery, with the diagnosis of screening Colonoscopy, dysphagia  The various methods of treatment have been discussed with the patient and family. After consideration of risks, benefits and other options for treatment, the patient has consented to  Procedure(s) with comments: COLONOSCOPY (N/A) - 0815 - moved to 11/15 @ 1:00 ESOPHAGOGASTRODUODENOSCOPY (EGD) (N/A) MALONEY DILATION (N/A) as a surgical intervention .  The patient's history has been reviewed, patient examined, no change in status, stable for surgery.  I have reviewed the patient's chart and labs.  Questions were answered to the patient's satisfaction.     Robert Rourk   No change. EGD with ED and colonoscopy per plan.  The risks, benefits, limitations, imponderables and alternatives regarding both EGD and colonoscopy have been reviewed with the patient. Questions have been answered. All parties agreeable.

## 2015-03-25 NOTE — Op Note (Signed)
[facility] [address] [place], [id number]   ENDOSCOPY PROCEDURE REPORT  PATIENT: Lauren King, Lauren King  MR#: [id number] BIRTHDATE: [birth year] , 64  yrs. old GENDER: female ENDOSCOPIST: R.  Roetta SessionsMichael Rourk, MD FACP FACG REFERRED BY:  Ernestine ConradKirk Bluth, M.D. PROCEDURE DATE:  03/25/2015 PROCEDURE:  EGD w/ biopsy and Maloney dilation of esophagus INDICATIONS:  Esophageal dysphagia; GERD. MEDICATIONS: Versed 6 mg IV and Demerol 150 mg IV in divided doses. Zofran 4 mg IV.  Xylocaine gel orally. ASA CLASS:      Class II  CONSENT: The risks, benefits, limitations, alternatives and imponderables have been discussed.  The potential for biopsy, esophogeal dilation, etc. have also been reviewed.  Questions have been answered.  All parties agreeable.  Please see the history and physical in the medical record for more information.  DESCRIPTION OF PROCEDURE: After the risks benefits and alternatives of the procedure were thoroughly explained, informed consent was obtained.  The [id number] ([id number] endoscope was introduced through the mouth and advanced to the second portion of the duodenum , limited by Without limitations. The instrument was slowly withdrawn as the mucosa was fully examined. Estimated blood loss is zero unless otherwise noted in this procedure report.    Noncritical.  Schatzki'King ring.  Distal esophageal erosions within 5 mm of the GE junction.  Undulating Z line versus 2 "tongues" of salmon-colored epithelium.  Tubular esophagus easily traversed with the scope.  The stomach emptied.  Patient had (3) prepyloric antral ulcers with a largest being approximately 1 cm?"elliptical in shape. All had clean bases.  They appeared to be benign.  Small hiatal hernia.  Scattered gastric erosions.  Patent pylorus. Normal-appearing first and second portion of the duodenum.  Scope was withdrawn and a 54 [place] Maloney dilator was passed to full insertion easily.  A low back  revealed no apparent complication related to this maneuver.  Subsequently, biopsies gastric ulcers taken.  Finally, biopsies the abnormal distal esophagus taken.  Retroflexed views revealed as previously described.     The scope was then withdrawn from the patient and the procedure completed.  COMPLICATIONS: There were no immediate complications.  ENDOSCOPIC IMPRESSION: Erosive reflux esophagitis. Schatzki'King ring?"Status Post Maloney Dilation. Query Short Segment Barrett'King Esophagus?"Status Post Biopsy. Hiatal Hernia. Multiple Gastric Ulcers?"Status Post Biopsy  RECOMMENDATIONS: Continue Nexium 40 mg twice daily. Avoid nonsteroidals. Follow-up on pathology. See colonoscopy report.  REPEAT EXAM:  eSigned:  R. Roetta SessionsMichael Rourk, MD Jerrel IvoryFACP [facility] 03/25/2015 1:34 PM    CC:  CPT CODES: ICD CODES:  The ICD and CPT codes recommended by this software are interpretations from the data that the clinical staff has captured with the software.  The verification of the translation of this report to the ICD and CPT codes and modifiers is the sole responsibility of the health care institution and practicing physician where this report was generated.  [employer] will not be held responsible for the validity of the ICD and CPT codes included on this report.  AMA assumes no liability for data contained or not contained herein. CPT is a Publishing rights managerregistered trademark of the [employer].  PATIENT NAME:  Lauren King, Phyliss King MR#: [id number]

## 2015-03-25 NOTE — Discharge Instructions (Signed)
Colonoscopy Discharge Instructions  Read the instructions outlined below and refer to this sheet in the next few weeks. These discharge instructions provide you with general information on caring for yourself after you leave the hospital. Your doctor may also give you specific instructions. While your treatment has been planned according to the most current medical practices available, unavoidable complications occasionally occur. If you have any problems or questions after discharge, call Dr. Jena Gauss at [phone]. ACTIVITY  You may resume your regular activity, but move at a slower pace for the next 24 hours.   Take frequent rest periods for the next 24 hours.   Walking will help get rid of the air and reduce the bloated feeling in your belly (abdomen).   No driving for 24 hours (because of the medicine (anesthesia) used during the test).    Do not sign any important legal documents or operate any machinery for 24 hours (because of the anesthesia used during the test).  NUTRITION  Drink plenty of fluids.   You may resume your normal diet as instructed by your doctor.   Begin with a light meal and progress to your normal diet. Heavy or fried foods are harder to digest and may make you feel sick to your stomach (nauseated).   Avoid alcoholic beverages for 24 hours or as instructed.  MEDICATIONS  You may resume your normal medications unless your doctor tells you otherwise.  WHAT YOU CAN EXPECT TODAY  Some feelings of bloating in the abdomen.   Passage of more gas than usual.   Spotting of blood in your stool or on the toilet paper.  IF YOU HAD POLYPS REMOVED DURING THE COLONOSCOPY:  No aspirin products for 7 days or as instructed.   No alcohol for 7 days or as instructed.   Eat a soft diet for the next 24 hours.  FINDING OUT THE RESULTS OF YOUR TEST Not all test results are available during your visit. If your test results are not back during the visit, make an appointment  with your caregiver to find out the results. Do not assume everything is normal if you have not heard from your caregiver or the medical facility. It is important for you to follow up on all of your test results.  SEEK IMMEDIATE MEDICAL ATTENTION IF:  You have more than a spotting of blood in your stool.   Your belly is swollen (abdominal distention).   You are nauseated or vomiting.   You have a temperature over 101.   You have abdominal pain or discomfort that is severe or gets worse throughout the day.    GERD and peptic ulcer information provided  Continue Nexium 40 mg twice daily  Diverticulosis information provided  Avoid aspirin and nonsteroidals like Advil and Aleve  Office visit with [place] in 3 months  Further recommendations to follow pending review of pathology report Peptic Ulcer A peptic ulcer is a sore in the lining of your esophagus (esophageal ulcer), stomach (gastric ulcer), or in the first part of your small intestine (duodenal ulcer). The ulcer causes erosion into the deeper tissue. CAUSES  Normally, the lining of the stomach and the small intestine protects itself from the acid that digests food. The protective lining can be damaged by:  An infection caused by a bacterium called Helicobacter pylori (H. pylori).  Regular use of nonsteroidal anti-inflammatory drugs (NSAIDs), such as ibuprofen or aspirin.  Smoking tobacco. Other risk factors include being older than 50, drinking alcohol excessively, and having  a family history of ulcer disease.  SYMPTOMS   Burning pain or gnawing in the area between the chest and the belly button.  Heartburn.  Nausea and vomiting.  Bloating. The pain can be worse on an empty stomach and at night. If the ulcer results in bleeding, it can cause:  Black, tarry stools.  Vomiting of bright red blood.  Vomiting of coffee-ground-looking materials. DIAGNOSIS  A diagnosis is usually made based upon your history and an exam.  Other tests and procedures may be performed to find the cause of the ulcer. Finding a cause will help determine the best treatment. Tests and procedures may include:  Blood tests, stool tests, or breath tests to check for the bacterium H. pylori.  An upper gastrointestinal (GI) series of the esophagus, stomach, and small intestine.  An endoscopy to examine the esophagus, stomach, and small intestine.  A biopsy. TREATMENT  Treatment may include:  Eliminating the cause of the ulcer, such as smoking, NSAIDs, or alcohol.  Medicines to reduce the amount of acid in your digestive tract.  Antibiotic medicines if the ulcer is caused by the H. pylori bacterium.  An upper endoscopy to treat a bleeding ulcer.  Surgery if the bleeding is severe or if the ulcer created a hole somewhere in the digestive system. HOME CARE INSTRUCTIONS   Avoid tobacco, alcohol, and caffeine. Smoking can increase the acid in the stomach, and continued smoking will impair the healing of ulcers.  Avoid foods and drinks that seem to cause discomfort or aggravate your ulcer.  Only take medicines as directed by your caregiver. Do not substitute over-the-counter medicines for prescription medicines without talking to your caregiver.  Keep any follow-up appointments and tests as directed. SEEK MEDICAL CARE IF:   Your do not improve within 7 days of starting treatment.  You have ongoing indigestion or heartburn. SEEK IMMEDIATE MEDICAL CARE IF:   You have sudden, sharp, or persistent abdominal pain.  You have bloody or dark black, tarry stools.  You vomit blood or vomit that looks like coffee grounds.  You become light-headed, weak, or feel faint.  You become sweaty or clammy. MAKE SURE YOU:   Understand these instructions.  Will watch your condition.  Will get help right away if you are not doing well or get worse.   This information is not intended to replace advice given to you by your health care  provider. Make sure you discuss any questions you have with your health care provider.   Document Released: 04/23/2000 Document Revised: 05/17/2014 Document Reviewed: 11/24/2011 Elsevier Interactive Patient Education 2016 [employer]   Gastroesophageal Reflux Disease, Adult Normally, food travels down the esophagus and stays in the stomach to be digested. However, when a person has gastroesophageal reflux disease (GERD), food and stomach acid move back up into the esophagus. When this happens, the esophagus becomes sore and inflamed. Over time, GERD can create small holes (ulcers) in the lining of the esophagus.  CAUSES This condition is caused by a problem with the muscle between the esophagus and the stomach (lower esophageal sphincter, or LES). Normally, the LES muscle closes after food passes through the esophagus to the stomach. When the LES is weakened or abnormal, it does not close properly, and that allows food and stomach acid to go back up into the esophagus. The LES can be weakened by certain dietary substances, medicines, and medical conditions, including:  Tobacco use.  Pregnancy.  Having a hiatal hernia.  Heavy alcohol use.  Certain foods and beverages, such as coffee, chocolate, onions, and peppermint. RISK FACTORS This condition is more likely to develop in:  People who have an increased body weight.  People who have connective tissue disorders.  People who use NSAID medicines. SYMPTOMS Symptoms of this condition include:  Heartburn.  Difficult or painful swallowing.  The feeling of having a lump in the throat.  Abitter taste in the mouth.  Bad breath.  Having a large amount of saliva.  Having an upset or bloated stomach.  Belching.  Chest pain.  Shortness of breath or wheezing.  Ongoing (chronic) cough or a night-time cough.  Wearing away of tooth enamel.  Weight loss. Different conditions can cause chest pain. Make sure to see your health  care provider if you experience chest pain. DIAGNOSIS Your health care provider will take a medical history and perform a physical exam. To determine if you have mild or severe GERD, your health care provider may also monitor how you respond to treatment. You may also have other tests, including:  An endoscopy toexamine your stomach and esophagus with a small camera.  A test thatmeasures the acidity level in your esophagus.  A test thatmeasures how much pressure is on your esophagus.  A barium swallow or modified barium swallow to show the shape, size, and functioning of your esophagus. TREATMENT The goal of treatment is to help relieve your symptoms and to prevent complications. Treatment for this condition may vary depending on how severe your symptoms are. Your health care provider may recommend:  Changes to your diet.  Medicine.  Surgery. HOME CARE INSTRUCTIONS Diet  Follow a diet as recommended by your health care provider. This may involve avoiding foods and drinks such as:  Coffee and tea (with or without caffeine).  Drinks that containalcohol.  Energy drinks and sports drinks.  Carbonated drinks or sodas.  Chocolate and cocoa.  Peppermint and mint flavorings.  Garlic and onions.  Horseradish.  Spicy and acidic foods, including peppers, chili powder, curry powder, vinegar, hot sauces, and barbecue sauce.  Citrus fruit juices and citrus fruits, such as oranges, lemons, and limes.  Tomato-based foods, such as red sauce, chili, salsa, and pizza with red sauce.  Fried and fatty foods, such as donuts, french fries, potato chips, and high-fat dressings.  High-fat meats, such as hot dogs and fatty cuts of red and white meats, such as rib eye steak, sausage, ham, and bacon.  High-fat dairy items, such as whole milk, butter, and cream cheese.  Eat small, frequent meals instead of large meals.  Avoid drinking large amounts of liquid with your meals.  Avoid  eating meals during the 2-3 hours before bedtime.  Avoid lying down right after you eat.  Do not exercise right after you eat. General Instructions  Pay attention to any changes in your symptoms.  Take over-the-counter and prescription medicines only as told by your health care provider. Do not take aspirin, ibuprofen, or other NSAIDs unless your health care provider told you to do so.  Do not use any tobacco products, including cigarettes, chewing tobacco, and e-cigarettes. If you need help quitting, ask your health care provider.  Wear loose-fitting clothing. Do not wear anything tight around your waist that causes pressure on your abdomen.  Raise (elevate) the head of your bed 6 inches (15cm).  Try to reduce your stress, such as with yoga or meditation. If you need help reducing stress, ask your health care provider.  If you are overweight, reduce  your weight to an amount that is healthy for you. Ask your health care provider for guidance about a safe weight loss goal.  Keep all follow-up visits as told by your health care provider. This is important. SEEK MEDICAL CARE IF:  You have new symptoms.  You have unexplained weight loss.  You have difficulty swallowing, or it hurts to swallow.  You have wheezing or a persistent cough.  Your symptoms do not improve with treatment.  You have a hoarse voice. SEEK IMMEDIATE MEDICAL CARE IF:  You have pain in your arms, neck, jaw, teeth, or back.  You feel sweaty, dizzy, or light-headed.  You have chest pain or shortness of breath.  You vomit and your vomit looks like blood or coffee grounds.  You faint.  Your stool is bloody or black.  You cannot swallow, drink, or eat.   This information is not intended to replace advice given to you by your health care provider. Make sure you discuss any questions you have with your health care provider.   Document Released: 02/03/2005 Document Revised: 01/15/2015 Document Reviewed:  08/21/2014 Elsevier Interactive Patient Education [employer]   Diverticulosis Diverticulosis is the condition that develops when small pouches (diverticula) form in the wall of your colon. Your colon, or large intestine, is where water is absorbed and stool is formed. The pouches form when the inside layer of your colon pushes through weak spots in the outer layers of your colon. CAUSES  No one knows exactly what causes diverticulosis. RISK FACTORS  Being older than 50. Your risk for this condition increases with age. Diverticulosis is rare in people younger than 40 years. By age 8, almost everyone has it.  Eating a low-fiber diet.  Being frequently constipated.  Being overweight.  Not getting enough exercise.  Smoking.  Taking over-the-counter pain medicines, like aspirin and ibuprofen. SYMPTOMS  Most people with diverticulosis do not have symptoms. DIAGNOSIS  Because diverticulosis often has no symptoms, health care providers often discover the condition during an exam for other colon problems. In many cases, a health care provider will diagnose diverticulosis while using a flexible scope to examine the colon (colonoscopy). TREATMENT  If you have never developed an infection related to diverticulosis, you may not need treatment. If you have had an infection before, treatment may include:  Eating more fruits, vegetables, and grains.  Taking a fiber supplement.  Taking a live bacteria supplement (probiotic).  Taking medicine to relax your colon. HOME CARE INSTRUCTIONS   Drink at least 6-8 glasses of water each day to prevent constipation.  Try not to strain when you have a bowel movement.  Keep all follow-up appointments. If you have had an infection before:  Increase the fiber in your diet as directed by your health care provider or dietitian.  Take a dietary fiber supplement if your health care provider approves.  Only take medicines as directed by your  health care provider. SEEK MEDICAL CARE IF:   You have abdominal pain.  You have bloating.  You have cramps.  You have not gone to the bathroom in 3 days. SEEK IMMEDIATE MEDICAL CARE IF:   Your pain gets worse.  Yourbloating becomes very bad.  You have a fever or chills, and your symptoms suddenly get worse.  You begin vomiting.  You have bowel movements that are bloody or black. MAKE SURE YOU:  Understand these instructions.  Will watch your condition.  Will get help right away if you are not doing  well or get worse.   This information is not intended to replace advice given to you by your health care provider. Make sure you discuss any questions you have with your health care provider.   Document Released: 01/22/2004 Document Revised: 05/01/2013 Document Reviewed: 03/21/2013 Elsevier Interactive Patient Education [employer]

## 2015-03-31 ENCOUNTER — Encounter: Payer: Self-pay | Admitting: Internal Medicine

## 2015-04-01 ENCOUNTER — Encounter (HOSPITAL_COMMUNITY): Payer: Self-pay | Admitting: Internal Medicine

## 2015-06-25 ENCOUNTER — Ambulatory Visit: Payer: Self-pay | Admitting: Nurse Practitioner

## 2015-07-16 ENCOUNTER — Ambulatory Visit: Payer: Self-pay | Admitting: Nurse Practitioner

## 2016-10-14 ENCOUNTER — Encounter: Payer: Self-pay | Admitting: *Deleted

## 2016-10-14 ENCOUNTER — Emergency Department: Payer: Medicare HMO

## 2016-10-14 ENCOUNTER — Emergency Department
Admission: EM | Admit: 2016-10-14 | Discharge: 2016-10-14 | Disposition: A | Discharge status: Home / Self Care | Payer: Medicare HMO | Attending: Emergency Medicine | Admitting: Emergency Medicine

## 2016-10-14 DIAGNOSIS — Z79899 Other long term (current) drug therapy: Secondary | ICD-10-CM | POA: Insufficient documentation

## 2016-10-14 DIAGNOSIS — Y92002 Bathroom of unspecified non-institutional (private) residence single-family (private) house as the place of occurrence of the external cause: Secondary | ICD-10-CM | POA: Diagnosis not present

## 2016-10-14 DIAGNOSIS — K5900 Constipation, unspecified: Secondary | ICD-10-CM | POA: Insufficient documentation

## 2016-10-14 DIAGNOSIS — W19XXXA Unspecified fall, initial encounter: Secondary | ICD-10-CM

## 2016-10-14 DIAGNOSIS — F17210 Nicotine dependence, cigarettes, uncomplicated: Secondary | ICD-10-CM | POA: Insufficient documentation

## 2016-10-14 DIAGNOSIS — W182XXA Fall in (into) shower or empty bathtub, initial encounter: Secondary | ICD-10-CM | POA: Insufficient documentation

## 2016-10-14 DIAGNOSIS — J449 Chronic obstructive pulmonary disease, unspecified: Secondary | ICD-10-CM | POA: Diagnosis not present

## 2016-10-14 DIAGNOSIS — Y999 Unspecified external cause status: Secondary | ICD-10-CM | POA: Insufficient documentation

## 2016-10-14 DIAGNOSIS — Z791 Long term (current) use of non-steroidal anti-inflammatories (NSAID): Secondary | ICD-10-CM | POA: Diagnosis not present

## 2016-10-14 DIAGNOSIS — S20221A Contusion of right back wall of thorax, initial encounter: Secondary | ICD-10-CM | POA: Diagnosis not present

## 2016-10-14 DIAGNOSIS — Y9389 Activity, other specified: Secondary | ICD-10-CM | POA: Insufficient documentation

## 2016-10-14 DIAGNOSIS — S299XXA Unspecified injury of thorax, initial encounter: Secondary | ICD-10-CM | POA: Diagnosis present

## 2016-10-14 MED ORDER — MAGNESIUM CITRATE PO SOLN
1.0000 | Freq: Once | ORAL | Status: AC
  Administered 2016-10-14: 1 via ORAL
  Filled 2016-10-14: qty 296

## 2016-10-14 MED ORDER — HYDROCODONE-ACETAMINOPHEN 5-325 MG PO TABS
1.0000 | ORAL_TABLET | Freq: Once | ORAL | Status: AC
  Administered 2016-10-14: 1 via ORAL
  Filled 2016-10-14: qty 1

## 2016-10-14 MED ORDER — TRAMADOL HCL 50 MG PO TABS: 50.0000 mg | ORAL_TABLET | Freq: Once | ORAL | Status: DC

## 2016-10-14 MED ORDER — LIDOCAINE 5 % EX PTCH
1.0000 | MEDICATED_PATCH | CUTANEOUS | Status: DC
  Administered 2016-10-14: 1 via TRANSDERMAL
  Filled 2016-10-14: qty 1

## 2016-10-14 MED ORDER — LIDOCAINE 5 % EX PTCH: 1.0000 | MEDICATED_PATCH | CUTANEOUS | 0 refills | Status: AC

## 2016-10-14 NOTE — ED Notes (Signed)
See triage note..the patient reports to ED w/ c/o mechanical fall on Sunday. Pt sts she's been having intermittent back pain since, gradually worsening.  Pt denies incontinence of bowel./bladder. Resp even and unlabored

## 2016-10-14 NOTE — ED Notes (Signed)

## 2016-10-14 NOTE — ED Provider Notes (Signed)
[facility] Emergency Department Provider Note  ____________________________________________  Time seen: Approximately 2:28 PM  I have reviewed the triage vital signs and the nursing notes.   HISTORY  Chief Complaint Fall    HPI Lauren King is a 66 y.o. female that presents to the emergency department with right-sided rib pain after falling in the bathtub 4 days ago. Patient states that she has handlebars and non-slip matt but still found a slippery spot and fell. She hit her right rib cage on the bathtub edge. She did not hit her head or lose consciousness. She does not take any blood thinners. She also notes that she has been constipated recently and is not sure when her last bowel movement was. She denies shortness of breath, chest pain, nausea, vomiting, abdominal pain.   Past Medical History:  Diagnosis Date  . Anxiety   . Chronic lower back pain   . COPD (chronic obstructive pulmonary disease) (HCC)   . Depression   . GERD (gastroesophageal reflux disease)   . Obstructive sleep apnea    CPAP    Patient Active Problem List   Diagnosis Date Noted  . Diverticulosis of colon without hemorrhage   . Reflux esophagitis   . Schatzki's ring   . Gastric ulceration   . Hiatal hernia   . Encounter for screening colonoscopy 02/06/2015  . Constipation 01/02/2015  . Esophageal dysphagia 09/21/2012  . GERD (gastroesophageal reflux disease) 09/21/2012  . Loss of weight 09/21/2012    Past Surgical History:  Procedure Laterality Date  . ABDOMINAL HYSTERECTOMY    . COLONOSCOPY  01/20/2005   [id number] colon polyp/Diminutive polyps at 30 cm ablated/Normal rectum. hyperplastic polyp  . COLONOSCOPY N/A 03/25/2015   WJX:[id number] /left-sided diverticula  . ESOPHAGOGASTRODUODENOSCOPY  11/30/2004   RMR:A small patch of salmon-colored epithelium (salmon-like lesion in the distal esophagus)/Antral pre-pyloric erosions of uncertain significance, otherwise,  normal. solitary duodenal bulbar AVM.No Barrett's.  . ESOPHAGOGASTRODUODENOSCOPY N/A 03/25/2015   NFA:[id number] reflux/schatzkis ring s/p dilation/HH/short segment barrett  . ESOPHAGOGASTRODUODENOSCOPY (EGD) WITH ESOPHAGEAL DILATION N/A 09/22/2012   Rourk: erosive RE, biopsy negative for Barrett's. Gastritis.   [name] MALONEY DILATION N/A 03/25/2015   Procedure: Elease Hashimoto DILATION;  Surgeon: Corbin Ade, MD;  Location: [facility];  Service: Endoscopy;  Laterality: N/A;  . SKIN CANCER DESTRUCTION    . TUBAL LIGATION     with incidental appendectomy    Prior to Admission medications   Medication Sig Start Date End Date Taking? Authorizing Provider  ALPRAZolam Prudy Feeler) 1 MG tablet Take 1 mg by mouth at bedtime as needed for sleep.    [provider]  esomeprazole (NEXIUM) 40 MG capsule Take 1 capsule (40 mg total) by mouth 2 (two) times daily. 12/12/12   Gelene Mink, NP  ibuprofen (ADVIL,MOTRIN) 200 MG tablet Take 200 mg by mouth every 6 (six) hours as needed.    [provider]  lidocaine (LIDODERM) 5 % Place 1 patch onto the skin daily. Remove & Discard patch within 12 hours or as directed by MD 10/14/16 10/14/17  Enid Derry, PA-C  Linaclotide (LINZESS) 290 MCG CAPS capsule Take 1 capsule (290 mcg total) by mouth daily. 01/02/15   Tiffany Kocher, PA-C  polyethylene glycol-electrolytes (TRILYTE) 420 G solution Take 4,000 mLs by mouth as directed. 03/12/15   Anice Paganini, NP  pregabalin (LYRICA) 100 MG capsule Take 100 mg by mouth.    [provider]  QUEtiapine (SEROQUEL) 300 MG tablet Take 300 mg  by mouth at bedtime.    [provider]  sertraline (ZOLOFT) 100 MG tablet Take 100 mg by mouth daily.    [provider]  tiZANidine (ZANAFLEX) 4 MG tablet Take 8 mg by mouth 3 (three) times daily.  03/05/15   [provider]  traMADol (ULTRAM) 50 MG tablet Take 100 mg by mouth daily.  02/27/15   [provider]  traZODone (DESYREL) 100 MG  tablet Take 100 mg by mouth at bedtime. 1/2 to 11/2 as needed at bedtime.    [provider]    Allergies Patient has no known allergies.  Family History  Problem Relation Age of Onset  . Lung cancer Sister   . Lung cancer Brother   . Colon cancer Neg Hx     Social History Social History  Substance Use Topics  . Smoking status: Current Every Day Smoker    Packs/day: 0.40    Years: 40.00    Types: Cigarettes  . Smokeless tobacco: Never Used     Comment: 5 a day  . Alcohol use No     Review of Systems  Constitutional: No fever/chills Cardiovascular: No chest pain. Respiratory: No SOB. Gastrointestinal: No abdominal pain.  No nausea, no vomiting.  Musculoskeletal: Positive for right-sided rib pain. Skin: Negative for rash, abrasions, lacerations. Neurological: Negative for headaches, numbness or tingling   ____________________________________________   PHYSICAL EXAM:  VITAL SIGNS: ED Triage Vitals  Enc Vitals Group     BP 10/14/16 1329 (!) 159/91     Pulse Rate 10/14/16 1329 84     Resp 10/14/16 1329 (!) 22     Temp 10/14/16 1329 97.9 F (36.6 C)     Temp Source 10/14/16 1329 Oral     SpO2 10/14/16 1329 95 %     Weight 10/14/16 1330 118 lb (53.5 kg)     Height 10/14/16 1330 5\' 1"  (1.549 m)     Head Circumference --      Peak Flow --      Pain Score 10/14/16 1329 10     Pain Loc --      Pain Edu? --      Excl. in GC? --      Constitutional: Alert and oriented. Well appearing and in no acute distress. Eyes: Conjunctivae are normal. PERRL. EOMI. Head: Atraumatic. ENT:      Ears:      Nose: No congestion/rhinnorhea.      Mouth/Throat: Mucous membranes are moist.  Neck: No stridor.   Cardiovascular: Normal rate, regular rhythm.  Good peripheral circulation. Respiratory: Normal respiratory effort without tachypnea or retractions. Lungs CTAB. Good air entry to the bases with no decreased or absent breath sounds. Gastrointestinal: Bowel sounds 4  quadrants. Soft and nontender to palpation. No guarding or rigidity. No palpable masses. No distention. Musculoskeletal: Full range of motion to all extremities. No gross deformities appreciated. Tenderness to palpation with bruise over right lateral rib cage. Neurologic:  Normal speech and language. No gross focal neurologic deficits are appreciated.  Skin:  Skin is warm, dry and intact. 1 inch bruise over right lateral lateral ribs.  ____________________________________________   LABS (all labs ordered are listed, but only abnormal results are displayed)  Labs Reviewed - No data to display ____________________________________________  EKG   ____________________________________________  RADIOLOGY Lexine BatonI, Caroline Matters, personally viewed and evaluated these images (plain radiographs) as part of my medical decision making, as well as reviewing the written report by the radiologist.  Dg Ribs Unilateral  W/chest Right  Result Date: 10/14/2016 CLINICAL DATA:  Pain following fall EXAM: RIGHT RIBS AND CHEST - 3+ VIEW COMPARISON:  December 07, 2013 chest radiograph FINDINGS: Frontal chest as well as oblique and cone-down rib images obtained. There is no edema or consolidation. Heart size and pulmonary vascularity are normal. No adenopathy. There is slight aortic atherosclerotic calcification. There is no evident pneumothorax or pleural effusion. No evident rib fracture. IMPRESSION: No demonstrable rib fracture. No edema or consolidation. There is aortic atherosclerosis. Electronically Signed   By: Bretta Bang III M.D.   On: 10/14/2016 14:41    ____________________________________________    PROCEDURES  Procedure(s) performed:    Procedures    Medications  lidocaine (LIDODERM) 5 % 1 patch (1 patch Transdermal Patch Applied 10/14/16 1606)  magnesium citrate solution 1 Bottle (1 Bottle Oral Given 10/14/16 1444)  HYDROcodone-acetaminophen (NORCO/VICODIN) 5-325 MG per tablet 1 tablet (1 tablet  Oral Given 10/14/16 1605)     ____________________________________________   INITIAL IMPRESSION / ASSESSMENT AND PLAN / ED COURSE  Pertinent labs & imaging results that were available during my care of the patient were reviewed by me and considered in my medical decision making (see chart for details).  Review of the [place] CSRS was performed in accordance of the [facility] prior to dispensing any controlled drugs.   Patient's diagnosis is consistent with musculoskeletal pain and contusion after fall. Vital signs and exam are reassuring. X-ray negative for acute bony abnormalities. Patient is tender to palpation where contusion is. Patient states that she has not had a bowel movement in a couple of days. This is a frequent problem. She does not want to take any medication in the ED for this. She is not having any abdominal pain. Patient will be discharged home with prescriptions for Lidoderm. Patient is to follow up with PCP as directed. Patient is given ED precautions to return to the ED for any worsening or new symptoms.     ____________________________________________  FINAL CLINICAL IMPRESSION(S) / ED DIAGNOSES  Final diagnoses:  Fall, initial encounter  Contusion of right back wall of thorax, initial encounter  Constipation, unspecified constipation type      NEW MEDICATIONS STARTED DURING THIS VISIT:  Discharge Medication List as of 10/14/2016  4:01 PM    START taking these medications   Details  lidocaine (LIDODERM) 5 % Place 1 patch onto the skin daily. Remove & Discard patch within 12 hours or as directed by MD, Starting Thu 10/14/2016, Until Fri 10/14/2017, Print            This chart was dictated using voice recognition software/Dragon. Despite best efforts to proofread, errors can occur which can change the meaning. Any change was purely unintentional.    Enid Derry, PA-C 10/14/16 1840    Sharman Cheek, MD 10/16/16 2025

## 2016-10-14 NOTE — ED Triage Notes (Signed)
Pt reports having fallen this weekend and hitting her right ribs. Pt reports area was painful but that the pain has increased throughout the week and pt has pain with breathing and when walking. No LOC or other pains reported. Pt able to ambulate without difficulty. Pt denies difficulty walking at home.

## 2017-02-11 ENCOUNTER — Other Ambulatory Visit (HOSPITAL_COMMUNITY): Payer: Self-pay | Admitting: Internal Medicine

## 2017-02-11 ENCOUNTER — Ambulatory Visit (HOSPITAL_COMMUNITY)
Admission: RE | Admit: 2017-02-11 | Discharge: 2017-02-11 | Disposition: A | Discharge status: Home / Self Care | Payer: Medicare HMO | Source: Ambulatory Visit | Attending: Internal Medicine | Admitting: Internal Medicine

## 2017-02-11 DIAGNOSIS — M542 Cervicalgia: Secondary | ICD-10-CM | POA: Diagnosis present

## 2017-02-11 DIAGNOSIS — M4312 Spondylolisthesis, cervical region: Secondary | ICD-10-CM | POA: Diagnosis not present

## 2017-02-11 DIAGNOSIS — M50322 Other cervical disc degeneration at C5-C6 level: Secondary | ICD-10-CM | POA: Insufficient documentation

## 2017-03-22 DIAGNOSIS — F329 Major depressive disorder, single episode, unspecified: Secondary | ICD-10-CM | POA: Insufficient documentation

## 2017-06-15 ENCOUNTER — Other Ambulatory Visit (HOSPITAL_COMMUNITY): Payer: Self-pay | Admitting: Respiratory Therapy

## 2017-06-15 DIAGNOSIS — J441 Chronic obstructive pulmonary disease with (acute) exacerbation: Secondary | ICD-10-CM

## 2017-06-28 ENCOUNTER — Ambulatory Visit (HOSPITAL_COMMUNITY): Admission: RE | Admit: 2017-06-28 | Payer: Medicaid Other | Source: Ambulatory Visit

## 2017-07-05 ENCOUNTER — Ambulatory Visit (HOSPITAL_COMMUNITY)
Admission: RE | Admit: 2017-07-05 | Discharge: 2017-07-05 | Disposition: A | Discharge status: Home / Self Care | Payer: Medicare HMO | Source: Ambulatory Visit | Attending: Pulmonary Disease | Admitting: Pulmonary Disease

## 2017-07-05 DIAGNOSIS — J441 Chronic obstructive pulmonary disease with (acute) exacerbation: Secondary | ICD-10-CM | POA: Diagnosis present

## 2017-07-05 LAB — PULMONARY FUNCTION TEST
DL/VA % PRED: 63 %
DL/VA: 2.77 ml/min/mmHg/L
DLCO unc % pred: 53 %
DLCO unc: 10.78 ml/min/mmHg
FEF 25-75 POST: 1.44 L/s
FEF 25-75 Pre: 0.8 L/sec
FEF2575-%CHANGE-POST: 79 %
FEF2575-%PRED-PRE: 44 %
FEF2575-%Pred-Post: 80 %
FEV1-%CHANGE-POST: 17 %
FEV1-%PRED-PRE: 76 %
FEV1-%Pred-Post: 90 %
FEV1-PRE: 1.56 L
FEV1-Post: 1.83 L
FEV1FVC-%Change-Post: 4 %
FEV1FVC-%PRED-PRE: 80 %
FEV6-%Change-Post: 12 %
FEV6-%PRED-PRE: 95 %
FEV6-%Pred-Post: 106 %
FEV6-POST: 2.74 L
FEV6-Pre: 2.44 L
FEV6FVC-%Change-Post: 0 %
FEV6FVC-%PRED-POST: 99 %
FEV6FVC-%Pred-Pre: 99 %
FVC-%CHANGE-POST: 12 %
FVC-%PRED-PRE: 95 %
FVC-%Pred-Post: 107 %
FVC-POST: 2.87 L
FVC-PRE: 2.56 L
POST FEV6/FVC RATIO: 95 %
PRE FEV1/FVC RATIO: 61 %
PRE FEV6/FVC RATIO: 95 %
Post FEV1/FVC ratio: 64 %
RV % pred: 151 %
RV: 3.04 L
TLC % PRED: 121 %
TLC: 5.57 L

## 2017-07-05 MED ORDER — ALBUTEROL SULFATE (2.5 MG/3ML) 0.083% IN NEBU
2.5000 mg | INHALATION_SOLUTION | Freq: Once | RESPIRATORY_TRACT | Status: AC
  Administered 2017-07-05: 2.5 mg via RESPIRATORY_TRACT

## 2017-07-20 ENCOUNTER — Ambulatory Visit (HOSPITAL_COMMUNITY)
Admission: RE | Admit: 2017-07-20 | Discharge: 2017-07-20 | Disposition: A | Discharge status: Home / Self Care | Payer: Medicare HMO | Source: Ambulatory Visit | Attending: Pulmonary Disease | Admitting: Pulmonary Disease

## 2017-07-20 ENCOUNTER — Other Ambulatory Visit (HOSPITAL_COMMUNITY): Payer: Self-pay | Admitting: Pulmonary Disease

## 2017-07-20 DIAGNOSIS — R0602 Shortness of breath: Secondary | ICD-10-CM | POA: Diagnosis not present

## 2017-07-20 DIAGNOSIS — J449 Chronic obstructive pulmonary disease, unspecified: Secondary | ICD-10-CM | POA: Diagnosis not present

## 2017-07-28 ENCOUNTER — Other Ambulatory Visit: Payer: Self-pay | Admitting: Acute Care

## 2017-07-28 DIAGNOSIS — Z122 Encounter for screening for malignant neoplasm of respiratory organs: Secondary | ICD-10-CM

## 2017-07-28 DIAGNOSIS — F17210 Nicotine dependence, cigarettes, uncomplicated: Principal | ICD-10-CM

## 2017-08-10 ENCOUNTER — Ambulatory Visit (INDEPENDENT_AMBULATORY_CARE_PROVIDER_SITE_OTHER)
Admission: RE | Admit: 2017-08-10 | Discharge: 2017-08-10 | Disposition: A | Discharge status: Home / Self Care | Payer: Medicare HMO | Source: Ambulatory Visit | Attending: Acute Care | Admitting: Acute Care

## 2017-08-10 ENCOUNTER — Encounter: Payer: Self-pay | Admitting: Acute Care

## 2017-08-10 ENCOUNTER — Ambulatory Visit (INDEPENDENT_AMBULATORY_CARE_PROVIDER_SITE_OTHER): Payer: Medicare HMO | Admitting: Acute Care

## 2017-08-10 DIAGNOSIS — F17210 Nicotine dependence, cigarettes, uncomplicated: Secondary | ICD-10-CM

## 2017-08-10 DIAGNOSIS — Z122 Encounter for screening for malignant neoplasm of respiratory organs: Secondary | ICD-10-CM

## 2017-08-10 DIAGNOSIS — Z789 Other specified health status: Secondary | ICD-10-CM

## 2017-08-10 NOTE — Progress Notes (Signed)
Shared Decision Making Visit Lung Cancer Screening Program [phone]([id number])   Eligibility:  Age 67 y.o.  Pack Years Smoking History Calculation 52 pack year smoking history (# packs/per year x # years smoked)  Recent History of coughing up blood  no  Unexplained weight loss? no ( >Than 15 pounds within the last 6 months )  Prior History Lung / other cancer no (Diagnosis within the last 5 years already requiring surveillance chest CT Scans).  Smoking Status Current Smoker  Former Smokers: Years since quit:NA  Quit Date: NA  Visit Components:  Discussion included one or more decision making aids. yes  Discussion included risk/benefits of screening. yes  Discussion included potential follow up diagnostic testing for abnormal scans. yes  Discussion included meaning and risk of over diagnosis. yes  Discussion included meaning and risk of False Positives. yes  Discussion included meaning of total radiation exposure. yes  Counseling Included:  Importance of adherence to annual lung cancer LDCT screening. yes  Impact of comorbidities on ability to participate in the program. yes  Ability and willingness to under diagnostic treatment. yes  Smoking Cessation Counseling:  Current Smokers:   Discussed importance of smoking cessation. yes  Information about tobacco cessation classes and interventions provided to patient. yes  Patient provided with "ticket" for LDCT Scan. yes  Symptomatic Patient. no   Counseling  Diagnosis Code: Tobacco Use Z72.0  Asymptomatic Patient yes  Counseling (Intermediate counseling: > three minutes counseling) [id number]  Former Smokers:   Discussed the importance of maintaining cigarette abstinence. yes  Diagnosis Code: Personal History of Nicotine Dependence. X91.478Z87.891  Information about tobacco cessation classes and interventions provided to patient. Yes  Patient provided with "ticket" for LDCT Scan. yes  Written Order for Lung Cancer  Screening with LDCT placed in Epic. Yes (CT Chest Lung Cancer Screening Low Dose W/O CM) [id number] Z12.2-Screening of respiratory organs Z87.891-Personal history of nicotine dependence  I have spent 25 minutes of face to face time with Lauren King discussing the risks and benefits of lung cancer screening. We viewed a power point together that explained in detail the above noted topics. We paused at intervals to allow for questions to be asked and answered to ensure understanding.We discussed that the single most powerful action that she can take to decrease her risk of developing lung cancer is to quit smoking. We discussed whether or not she is ready to commit to setting a quit date. We discussed options for tools to aid in quitting smoking including nicotine replacement therapy, non-nicotine medications, support groups, Quit Smart classes, and behavior modification. We discussed that often times setting smaller, more achievable goals, such as eliminating 1 cigarette a day for a week and then 2 cigarettes a day for a week can be helpful in slowly decreasing the number of cigarettes smoked. This allows for a sense of accomplishment as well as providing a clinical benefit. I gave her the " Be Stronger Than Your Excuses" card with contact information for community resources, classes, free nicotine replacement therapy, and access to mobile apps, text messaging, and on-line smoking cessation help. I have also given her my card and contact information in the event she needs to contact me. We discussed the time and location of the scan, and that either Lauren King or I will call with the results within 24-48 hours of receiving them. I have offered her  a copy of the power point we viewed  as a resource in the event they need reinforcement of  the concepts we discussed today in the office. The patient verbalized understanding of all of  the above and had no further questions upon leaving the office. They have my  contact information in the event they have any further questions.  I spent 4 minutes counseling on smoking cessation and the health risks of continued tobacco abuse.  I explained to the patient that there has been a high incidence of coronary artery disease noted on these exams. I explained that this is a non-gated exam therefore degree or severity cannot be determined. This patient is not on statin therapy. I have asked the patient to follow-up with their PCP regarding any incidental finding of coronary artery disease and management with diet or medication as their PCP  feels is clinically indicated. The patient verbalized understanding of the above and had no further questions upon completion of the visit.      Bevelyn Ngo, NP 08/10/2017 10:56 AM

## 2017-08-16 ENCOUNTER — Other Ambulatory Visit: Payer: Self-pay | Admitting: Acute Care

## 2017-08-16 DIAGNOSIS — F17210 Nicotine dependence, cigarettes, uncomplicated: Principal | ICD-10-CM

## 2017-08-16 DIAGNOSIS — Z122 Encounter for screening for malignant neoplasm of respiratory organs: Secondary | ICD-10-CM

## 2017-10-17 ENCOUNTER — Encounter: Payer: Self-pay | Admitting: Podiatry

## 2017-10-17 ENCOUNTER — Other Ambulatory Visit: Payer: Self-pay

## 2017-10-17 ENCOUNTER — Ambulatory Visit (INDEPENDENT_AMBULATORY_CARE_PROVIDER_SITE_OTHER): Payer: Medicare HMO | Admitting: Podiatry

## 2017-10-17 DIAGNOSIS — G609 Hereditary and idiopathic neuropathy, unspecified: Secondary | ICD-10-CM | POA: Diagnosis not present

## 2017-10-17 MED ORDER — GABAPENTIN 100 MG PO CAPS: 100.0000 mg | ORAL_CAPSULE | Freq: Three times a day (TID) | ORAL | 3 refills | Status: DC

## 2017-10-18 NOTE — Progress Notes (Signed)
   HPI: 67 year old female presenting today as a new patient with a chief complaint of burning to the toes of the left foot that began about 6 months ago. She reports some associated dry, patchy skin. She notes some cramping of the bilateral feet at night. She has not done anything for treatment. There are no modifying factors noted. Patient is here for further evaluation and treatment.   Past Medical History:  Diagnosis Date  . Anxiety   . Chronic lower back pain   . COPD (chronic obstructive pulmonary disease) (HCC)   . Depression   . GERD (gastroesophageal reflux disease)   . Obstructive sleep apnea    CPAP     Physical Exam: General: The patient is alert and oriented x3 in no acute distress.  Dermatology: Skin is warm, dry and supple bilateral lower extremities. Negative for open lesions or macerations.  Vascular: Palpable pedal pulses bilaterally. No edema or erythema noted. Capillary refill within normal limits.  Neurological: Epicritic and protective threshold grossly intact bilaterally.   Musculoskeletal Exam: Range of motion within normal limits to all pedal and ankle joints bilateral. Muscle strength 5/5 in all groups bilateral.    Assessment: 1. Idiopathic neuropathy left forefoot   Plan of Care:  1. Patient evaluated.  2. Prescription for gabapentin 100 mg three times daily provided to patient.  3. Recommended good shoe gear.  4. Return to clinic as needed.     Felecia ShellingBrent M. Sueanne Maniaci, DPM [facility]  Dr. Felecia ShellingBrent M. Demaris Bousquet, DPM    [address]                                        [place], [place] [address]                Office [phone]  Fax [phone]

## 2017-10-26 ENCOUNTER — Other Ambulatory Visit: Payer: Self-pay

## 2017-10-26 DIAGNOSIS — I739 Peripheral vascular disease, unspecified: Secondary | ICD-10-CM

## 2017-12-19 ENCOUNTER — Ambulatory Visit: Payer: Self-pay | Admitting: Podiatry

## 2017-12-20 ENCOUNTER — Other Ambulatory Visit: Payer: Self-pay

## 2017-12-20 ENCOUNTER — Other Ambulatory Visit: Payer: Self-pay | Admitting: *Deleted

## 2017-12-20 ENCOUNTER — Encounter: Payer: Self-pay | Admitting: Vascular Surgery

## 2017-12-20 ENCOUNTER — Ambulatory Visit (INDEPENDENT_AMBULATORY_CARE_PROVIDER_SITE_OTHER): Payer: Medicare HMO | Admitting: Vascular Surgery

## 2017-12-20 ENCOUNTER — Ambulatory Visit (HOSPITAL_COMMUNITY)
Admission: RE | Admit: 2017-12-20 | Discharge: 2017-12-20 | Disposition: A | Discharge status: Home / Self Care | Payer: Medicare HMO | Source: Ambulatory Visit | Attending: Vascular Surgery | Admitting: Vascular Surgery

## 2017-12-20 ENCOUNTER — Encounter: Payer: Self-pay | Admitting: *Deleted

## 2017-12-20 ENCOUNTER — Encounter

## 2017-12-20 VITALS — BP 111/71 | HR 65 | Temp 97.1°F | Resp 14 | Ht 61.0 in | Wt 80.0 lb

## 2017-12-20 DIAGNOSIS — I739 Peripheral vascular disease, unspecified: Secondary | ICD-10-CM | POA: Insufficient documentation

## 2017-12-20 NOTE — H&P (View-Only) (Signed)
[facility] of [place]  Patient name: Lauren King Krukowski MRN: [id number] DOB: [birth year] Sex: female  REASON FOR CONSULT: Evaluation of lower extremity symptoms  HPI: Lauren King Walthers is a 67 y.o. female, who is here today for evaluation of lower extremity arterial flow.  She has several components to her lower extremity complaints.  She has severe bilateral lower extremity cramping.  This is mostly at night but can occur in the daytime as well.  She describes classic nocturnal cramping with soreness in the muscles the day following this.  She also has clear-cut claudication symptoms.  She reports that when she walks she has a sensation that her legs will give way from her knees distally.  She has no tissue loss.  She did have an ulceration over her distal left shin that had quite some time but did eventually heal.  She has no history of cardiac disease and has no history of stroke.  She does have a history of severe COPD.  She is quite frail in appearance.  Past Medical History:  Diagnosis Date  . Anxiety   . Chronic lower back pain   . COPD (chronic obstructive pulmonary disease) (HCC)   . Depression   . GERD (gastroesophageal reflux disease)   . Obstructive sleep apnea    CPAP    Family History  Problem Relation Age of Onset  . Lung cancer Sister   . Lung cancer Brother   . Colon cancer Neg Hx     SOCIAL HISTORY: Social History   Socioeconomic History  . Marital status: Divorced    Spouse name: Not on file  . Number of children: Not on file  . Years of education: Not on file  . Highest education level: Not on file  Occupational History  . Not on file  Social Needs  . Financial resource strain: Not on file  . Food insecurity:    Worry: Not on file    Inability: Not on file  . Transportation needs:    Medical: Not on file    Non-medical: Not on file  Tobacco Use  . Smoking status: Current Every Day Smoker   Packs/day: 0.40    Years: 40.00    Pack years: 16.00    Types: Cigarettes  . Smokeless tobacco: Never Used  . Tobacco comment: 5 a day  Substance and Sexual Activity  . Alcohol use: No  . Drug use: No  . Sexual activity: Not on file  Lifestyle  . Physical activity:    Days per week: Not on file    Minutes per session: Not on file  . Stress: Not on file  Relationships  . Social connections:    Talks on phone: Not on file    Gets together: Not on file    Attends religious service: Not on file    Active member of club or organization: Not on file    Attends meetings of clubs or organizations: Not on file    Relationship status: Not on file  . Intimate partner violence:    Fear of current or ex partner: Not on file    Emotionally abused: Not on file    Physically abused: Not on file    Forced sexual activity: Not on file  Other Topics Concern  . Not on file  Social History Narrative  . Not on file    No Known Allergies  Current Outpatient Medications  Medication Sig Dispense Refill  . gabapentin (NEURONTIN)  100 MG capsule Take 1 capsule (100 mg total) by mouth 3 (three) times daily. 90 capsule 3  . ibuprofen (ADVIL,MOTRIN) 200 MG tablet Take 200 mg by mouth every 6 (six) hours as needed.    . traMADol (ULTRAM) 50 MG tablet Take 100 mg by mouth daily.     . traZODone (DESYREL) 100 MG tablet Take 100 mg by mouth at bedtime. 1/2 to 11/2 as needed at bedtime.    . ALPRAZolam (XANAX) 1 MG tablet Take 1 mg by mouth at bedtime as needed for sleep.    . citalopram (CELEXA) 20 MG tablet Take 20 mg by mouth daily.  3  . esomeprazole (NEXIUM) 40 MG capsule Take 1 capsule (40 mg total) by mouth 2 (two) times daily. (Patient not taking: Reported on 12/20/2017) 60 capsule 3  . Linaclotide (LINZESS) 290 MCG CAPS capsule Take 1 capsule (290 mcg total) by mouth daily. (Patient not taking: Reported on 12/20/2017) 30 capsule 1  . polyethylene glycol-electrolytes (TRILYTE) 420 G solution Take  4,000 mLs by mouth as directed. (Patient not taking: Reported on 12/20/2017) 4000 mL 0  . pregabalin (LYRICA) 100 MG capsule Take 100 mg by mouth.    . QUEtiapine (SEROQUEL) 300 MG tablet Take 300 mg by mouth at bedtime.    . sertraline (ZOLOFT) 100 MG tablet Take 100 mg by mouth daily.    [name]. tiZANidine (ZANAFLEX) 4 MG tablet Take 8 mg by mouth 3 (three) times daily.     . TRELEGY ELLIPTA 100-62.5-25 MCG/INH AEPB inhale ONE PUFF EVERY DAY  12   No current facility-administered medications for this visit.     REVIEW OF SYSTEMS:  [X]  denotes positive finding, [ ]  denotes negative finding Cardiac  Comments:  Chest pain or chest pressure:    Shortness of breath upon exertion:    Short of breath when lying flat:    Irregular heart rhythm:        Vascular    Pain in calf, thigh, or hip brought on by ambulation: x   Pain in feet at night that wakes you up from your sleep:  x   Blood clot in your veins:    Leg swelling:         Pulmonary    Oxygen at home: x   Productive cough:     Wheezing:         Neurologic    Sudden weakness in arms or legs:     Sudden numbness in arms or legs:     Sudden onset of difficulty speaking or slurred speech:    Temporary loss of vision in one eye:     Problems with dizziness:         Gastrointestinal    Blood in stool:     Vomited blood:         Genitourinary    Burning when urinating:     Blood in urine:        Psychiatric    Major depression:         Hematologic    Bleeding problems:    Problems with blood clotting too easily:        Skin    Rashes or ulcers:        Constitutional    Fever or chills:      PHYSICAL EXAM: Vitals:   12/20/17 1300  BP: 111/71  Pulse: 65  Resp: 14  Temp: (!) 97.1 F (36.2 C)  TempSrc: Oral  SpO2: 98%  Weight: 80 lb (36.3 kg)  Height: 5\' 1"  (1.549 m)    GENERAL: The patient is a well-nourished female, in no acute distress. The vital signs are documented above. CARDIOVASCULAR: Carotid arteries  without bruits bilaterally.  2+ radial pulses bilaterally.  1-2+ femoral pulses bilaterally.  Absent popliteal and pedal pulses bilaterally PULMONARY: There is good air exchange  ABDOMEN: Soft and non-tender  MUSCULOSKELETAL: There are no major deformities or cyanosis. NEUROLOGIC: No focal weakness or paresthesias are detected. SKIN: There are no ulcers or rashes noted. PSYCHIATRIC: The patient has a normal affect.  DATA:  Noninvasive studies reveal ankle arm index of 0.88 on the right and 0.  4 6 on the left.  MEDICAL ISSUES: Long discussion with the patient.  I explained that her cramping is not related to arterial insufficiency.  She does report claudication symptoms and reports this is very limiting to her as well.  I did explain the next step in evaluation would be arteriography to determine the cause of her occlusive disease.  Explained that this potentially could be treated with endovascular means and may require open surgery.  I do not feel that she is currently at risk for limb loss and would certainly reserve surgery for a progressive ischemia.  She wishes to proceed with evaluation therefore we will schedule her for outpatient arteriography and possible intervention at her earliest convenience.  She has no history of renal insufficiency and is not on anticoagulation   Larina Earthly, MD [facility] Tel [phone] Pager [phone]

## 2017-12-20 NOTE — Progress Notes (Signed)
  Vascular and Vein Specialist of [place]  Patient name: Lauren King MRN: [id number] DOB: 01/14/1951 Sex: female  REASON FOR CONSULT: Evaluation of lower extremity symptoms  HPI: Lauren King is a 67 y.o. female, who is here today for evaluation of lower extremity arterial flow.  She has several components to her lower extremity complaints.  She has severe bilateral lower extremity cramping.  This is mostly at night but can occur in the daytime as well.  She describes classic nocturnal cramping with soreness in the muscles the day following this.  She also has clear-cut claudication symptoms.  She reports that when she walks she has a sensation that her legs will give way from her knees distally.  She has no tissue loss.  She did have an ulceration over her distal left shin that had quite some time but did eventually heal.  She has no history of cardiac disease and has no history of stroke.  She does have a history of severe COPD.  She is quite frail in appearance.  Past Medical History:  Diagnosis Date  . Anxiety   . Chronic lower back pain   . COPD (chronic obstructive pulmonary disease) (HCC)   . Depression   . GERD (gastroesophageal reflux disease)   . Obstructive sleep apnea    CPAP    Family History  Problem Relation Age of Onset  . Lung cancer Sister   . Lung cancer Brother   . Colon cancer Neg Hx     SOCIAL HISTORY: Social History   Socioeconomic History  . Marital status: Divorced    Spouse name: Not on file  . Number of children: Not on file  . Years of education: Not on file  . Highest education level: Not on file  Occupational History  . Not on file  Social Needs  . Financial resource strain: Not on file  . Food insecurity:    Worry: Not on file    Inability: Not on file  . Transportation needs:    Medical: Not on file    Non-medical: Not on file  Tobacco Use  . Smoking status: Current Every Day Smoker   Packs/day: 0.40    Years: 40.00    Pack years: 16.00    Types: Cigarettes  . Smokeless tobacco: Never Used  . Tobacco comment: 5 a day  Substance and Sexual Activity  . Alcohol use: No  . Drug use: No  . Sexual activity: Not on file  Lifestyle  . Physical activity:    Days per week: Not on file    Minutes per session: Not on file  . Stress: Not on file  Relationships  . Social connections:    Talks on phone: Not on file    Gets together: Not on file    Attends religious service: Not on file    Active member of club or organization: Not on file    Attends meetings of clubs or organizations: Not on file    Relationship status: Not on file  . Intimate partner violence:    Fear of current or ex partner: Not on file    Emotionally abused: Not on file    Physically abused: Not on file    Forced sexual activity: Not on file  Other Topics Concern  . Not on file  Social History Narrative  . Not on file    No Known Allergies  Current Outpatient Medications  Medication Sig Dispense Refill  . gabapentin (NEURONTIN)   100 MG capsule Take 1 capsule (100 mg total) by mouth 3 (three) times daily. 90 capsule 3  . ibuprofen (ADVIL,MOTRIN) 200 MG tablet Take 200 mg by mouth every 6 (six) hours as needed.    . traMADol (ULTRAM) 50 MG tablet Take 100 mg by mouth daily.     . traZODone (DESYREL) 100 MG tablet Take 100 mg by mouth at bedtime. 1/2 to 11/2 as needed at bedtime.    . ALPRAZolam (XANAX) 1 MG tablet Take 1 mg by mouth at bedtime as needed for sleep.    . citalopram (CELEXA) 20 MG tablet Take 20 mg by mouth daily.  3  . esomeprazole (NEXIUM) 40 MG capsule Take 1 capsule (40 mg total) by mouth 2 (two) times daily. (Patient not taking: Reported on 12/20/2017) 60 capsule 3  . Linaclotide (LINZESS) 290 MCG CAPS capsule Take 1 capsule (290 mcg total) by mouth daily. (Patient not taking: Reported on 12/20/2017) 30 capsule 1  . polyethylene glycol-electrolytes (TRILYTE) 420 G solution Take  4,000 mLs by mouth as directed. (Patient not taking: Reported on 12/20/2017) 4000 mL 0  . pregabalin (LYRICA) 100 MG capsule Take 100 mg by mouth.    . QUEtiapine (SEROQUEL) 300 MG tablet Take 300 mg by mouth at bedtime.    . sertraline (ZOLOFT) 100 MG tablet Take 100 mg by mouth daily.    . tiZANidine (ZANAFLEX) 4 MG tablet Take 8 mg by mouth 3 (three) times daily.     . TRELEGY ELLIPTA 100-62.5-25 MCG/INH AEPB inhale ONE PUFF EVERY DAY  12   No current facility-administered medications for this visit.     REVIEW OF SYSTEMS:  [X] denotes positive finding, [ ] denotes negative finding Cardiac  Comments:  Chest pain or chest pressure:    Shortness of breath upon exertion:    Short of breath when lying flat:    Irregular heart rhythm:        Vascular    Pain in calf, thigh, or hip brought on by ambulation: x   Pain in feet at night that wakes you up from your sleep:  x   Blood clot in your veins:    Leg swelling:         Pulmonary    Oxygen at home: x   Productive cough:     Wheezing:         Neurologic    Sudden weakness in arms or legs:     Sudden numbness in arms or legs:     Sudden onset of difficulty speaking or slurred speech:    Temporary loss of vision in one eye:     Problems with dizziness:         Gastrointestinal    Blood in stool:     Vomited blood:         Genitourinary    Burning when urinating:     Blood in urine:        Psychiatric    Major depression:         Hematologic    Bleeding problems:    Problems with blood clotting too easily:        Skin    Rashes or ulcers:        Constitutional    Fever or chills:      PHYSICAL EXAM: Vitals:   12/20/17 1300  BP: 111/71  Pulse: 65  Resp: 14  Temp: (!) 97.1 F (36.2 C)  TempSrc: Oral  SpO2: 98%    Weight: 80 lb (36.3 kg)  Height: 5' 1" (1.549 m)    GENERAL: The patient is a well-nourished female, in no acute distress. The vital signs are documented above. CARDIOVASCULAR: Carotid arteries  without bruits bilaterally.  2+ radial pulses bilaterally.  1-2+ femoral pulses bilaterally.  Absent popliteal and pedal pulses bilaterally PULMONARY: There is good air exchange  ABDOMEN: Soft and non-tender  MUSCULOSKELETAL: There are no major deformities or cyanosis. NEUROLOGIC: No focal weakness or paresthesias are detected. SKIN: There are no ulcers or rashes noted. PSYCHIATRIC: The patient has a normal affect.  DATA:  Noninvasive studies reveal ankle arm index of 0.88 on the right and 0.  4 6 on the left.  MEDICAL ISSUES: Long discussion with the patient.  I explained that her cramping is not related to arterial insufficiency.  She does report claudication symptoms and reports this is very limiting to her as well.  I did explain the next step in evaluation would be arteriography to determine the cause of her occlusive disease.  Explained that this potentially could be treated with endovascular means and may require open surgery.  I do not feel that she is currently at risk for limb loss and would certainly reserve surgery for a progressive ischemia.  She wishes to proceed with evaluation therefore we will schedule her for outpatient arteriography and possible intervention at her earliest convenience.  She has no history of renal insufficiency and is not on anticoagulation   Sherif Millspaugh F. Tobin Cadiente, MD FACS [facility] [place] Office Tel [phone] Pager [phone]    

## 2017-12-21 ENCOUNTER — Ambulatory Visit: Payer: Medicare HMO | Admitting: Podiatry

## 2017-12-27 ENCOUNTER — Other Ambulatory Visit: Payer: Self-pay

## 2017-12-27 ENCOUNTER — Ambulatory Visit (HOSPITAL_COMMUNITY)
Admission: RE | Disposition: A | Discharge status: Home / Self Care | Payer: Self-pay | Source: Ambulatory Visit | Attending: Surgery

## 2017-12-27 ENCOUNTER — Ambulatory Visit (HOSPITAL_COMMUNITY)
Admission: RE | Admit: 2017-12-27 | Discharge: 2017-12-27 | Disposition: A | Discharge status: Home / Self Care | Payer: Medicare HMO | Source: Ambulatory Visit | Attending: Surgery | Admitting: Surgery

## 2017-12-27 DIAGNOSIS — K219 Gastro-esophageal reflux disease without esophagitis: Secondary | ICD-10-CM | POA: Diagnosis not present

## 2017-12-27 DIAGNOSIS — F419 Anxiety disorder, unspecified: Secondary | ICD-10-CM | POA: Insufficient documentation

## 2017-12-27 DIAGNOSIS — F17210 Nicotine dependence, cigarettes, uncomplicated: Secondary | ICD-10-CM | POA: Insufficient documentation

## 2017-12-27 DIAGNOSIS — J449 Chronic obstructive pulmonary disease, unspecified: Secondary | ICD-10-CM | POA: Insufficient documentation

## 2017-12-27 DIAGNOSIS — Z79899 Other long term (current) drug therapy: Secondary | ICD-10-CM | POA: Insufficient documentation

## 2017-12-27 DIAGNOSIS — I70213 Atherosclerosis of native arteries of extremities with intermittent claudication, bilateral legs: Secondary | ICD-10-CM | POA: Diagnosis present

## 2017-12-27 DIAGNOSIS — G4733 Obstructive sleep apnea (adult) (pediatric): Secondary | ICD-10-CM | POA: Insufficient documentation

## 2017-12-27 DIAGNOSIS — Z7951 Long term (current) use of inhaled steroids: Secondary | ICD-10-CM | POA: Diagnosis not present

## 2017-12-27 DIAGNOSIS — F329 Major depressive disorder, single episode, unspecified: Secondary | ICD-10-CM | POA: Diagnosis not present

## 2017-12-27 HISTORY — PX: LOWER EXTREMITY ANGIOGRAPHY: CATH118251

## 2017-12-27 LAB — POCT I-STAT, CHEM 8
BUN: 15 mg/dL (ref 8–23)
CHLORIDE: 102 mmol/L (ref 98–111)
CREATININE: 0.5 mg/dL (ref 0.44–1.00)
Calcium, Ion: 1.21 mmol/L (ref 1.15–1.40)
GLUCOSE: 79 mg/dL (ref 70–99)
HCT: 45 % (ref 36.0–46.0)
Hemoglobin: 15.3 g/dL — ABNORMAL HIGH (ref 12.0–15.0)
Potassium: 3.8 mmol/L (ref 3.5–5.1)
SODIUM: 136 mmol/L (ref 135–145)
TCO2: 23 mmol/L (ref 22–32)

## 2017-12-27 SURGERY — LOWER EXTREMITY ANGIOGRAPHY
Anesthesia: LOCAL

## 2017-12-27 MED ORDER — MORPHINE SULFATE (PF) 10 MG/ML IV SOLN: 2.0000 mg | INTRAVENOUS | Status: DC | PRN

## 2017-12-27 MED ORDER — MIDAZOLAM HCL 2 MG/2ML IJ SOLN
INTRAMUSCULAR | Status: DC | PRN
  Administered 2017-12-27: 1 mg via INTRAVENOUS

## 2017-12-27 MED ORDER — LABETALOL HCL 5 MG/ML IV SOLN: 10.0000 mg | INTRAVENOUS | Status: DC | PRN

## 2017-12-27 MED ORDER — SODIUM CHLORIDE 0.9 % IV SOLN
INTRAVENOUS | Status: DC
  Administered 2017-12-27: 12:00:00 via INTRAVENOUS

## 2017-12-27 MED ORDER — IODIXANOL 320 MG/ML IV SOLN
INTRAVENOUS | Status: DC | PRN
  Administered 2017-12-27: 130 mL via INTRAVENOUS

## 2017-12-27 MED ORDER — LIDOCAINE HCL (PF) 1 % IJ SOLN
INTRAMUSCULAR | Status: DC | PRN
  Administered 2017-12-27: 20 mL via INTRADERMAL

## 2017-12-27 MED ORDER — ACETAMINOPHEN 325 MG PO TABS
650.0000 mg | ORAL_TABLET | ORAL | Status: DC | PRN
  Filled 2017-12-27: qty 2

## 2017-12-27 MED ORDER — ONDANSETRON HCL 4 MG/2ML IJ SOLN: 4.0000 mg | Freq: Four times a day (QID) | INTRAMUSCULAR | Status: DC | PRN

## 2017-12-27 MED ORDER — OXYCODONE HCL 5 MG PO TABS
5.0000 mg | ORAL_TABLET | ORAL | Status: DC | PRN
  Administered 2017-12-27: 5 mg via ORAL
  Filled 2017-12-27: qty 1
  Filled 2017-12-27: qty 2

## 2017-12-27 MED ORDER — FENTANYL CITRATE (PF) 100 MCG/2ML IJ SOLN
INTRAMUSCULAR | Status: AC
  Filled 2017-12-27: qty 2

## 2017-12-27 MED ORDER — MIDAZOLAM HCL 2 MG/2ML IJ SOLN
INTRAMUSCULAR | Status: AC
  Filled 2017-12-27: qty 2

## 2017-12-27 MED ORDER — SODIUM CHLORIDE 0.9% FLUSH: 3.0000 mL | Freq: Two times a day (BID) | INTRAVENOUS | Status: DC

## 2017-12-27 MED ORDER — SODIUM CHLORIDE 0.9% FLUSH: 3.0000 mL | INTRAVENOUS | Status: DC | PRN

## 2017-12-27 MED ORDER — LIDOCAINE HCL (PF) 1 % IJ SOLN
INTRAMUSCULAR | Status: AC
  Filled 2017-12-27: qty 30

## 2017-12-27 MED ORDER — SODIUM CHLORIDE 0.9 % IV SOLN: INTRAVENOUS | Status: AC

## 2017-12-27 MED ORDER — FENTANYL CITRATE (PF) 100 MCG/2ML IJ SOLN
INTRAMUSCULAR | Status: DC | PRN
  Administered 2017-12-27 (×2): 25 ug via INTRAVENOUS

## 2017-12-27 MED ORDER — HYDRALAZINE HCL 20 MG/ML IJ SOLN: 5.0000 mg | INTRAMUSCULAR | Status: DC | PRN

## 2017-12-27 MED ORDER — HEPARIN (PORCINE) IN NACL 1000-0.9 UT/500ML-% IV SOLN
INTRAVENOUS | Status: AC
  Filled 2017-12-27: qty 1000

## 2017-12-27 MED ORDER — SODIUM CHLORIDE 0.9 % IV SOLN: 250.0000 mL | INTRAVENOUS | Status: DC | PRN

## 2017-12-27 SURGICAL SUPPLY — 13 items
CATH ANGIO 5F BER2 65CM (CATHETERS) ×2 IMPLANT
CATH OMNI FLUSH 5F 65CM (CATHETERS) ×2 IMPLANT
DEVICE TORQUE H2O (MISCELLANEOUS) ×2 IMPLANT
GUIDEWIRE ANGLED .035X150CM (WIRE) ×2 IMPLANT
KIT MICROPUNCTURE NIT STIFF (SHEATH) ×2 IMPLANT
KIT PV (KITS) ×2 IMPLANT
SHEATH PINNACLE 5F 10CM (SHEATH) ×2 IMPLANT
SHEATH PROBE COVER 6X72 (BAG) ×2 IMPLANT
SHIELD RADPAD SCOOP 12X17 (MISCELLANEOUS) ×2 IMPLANT
SYR MEDRAD MARK V 150ML (SYRINGE) ×2 IMPLANT
TRANSDUCER W/STOPCOCK (MISCELLANEOUS) ×2 IMPLANT
TRAY PV CATH (CUSTOM PROCEDURE TRAY) ×2 IMPLANT
WIRE BENTSON .035X145CM (WIRE) ×2 IMPLANT

## 2017-12-27 NOTE — Interval H&P Note (Signed)
History and Physical Interval Note:  12/27/2017 1:43 PM  Lauren King  has presented today for surgery, with the diagnosis of pvd  The various methods of treatment have been discussed with the patient and family. After consideration of risks, benefits and other options for treatment, the patient has consented to  Procedure(s): LOWER EXTREMITY ANGIOGRAPHY (N/A) as a surgical intervention .  The patient's history has been reviewed, patient examined, no change in status, stable for surgery.  I have reviewed the patient's chart and labs.  Questions were answered to the patient's satisfaction.     Durene CalWells [name]

## 2017-12-27 NOTE — Progress Notes (Signed)
Site area: right groin fa sheath Site Prior to Removal:  Level 0 Pressure Applied For: 20 minutes Manual:   yes Patient Status During Pull:  stable Post Pull Site:  Level 0 Post Pull Instructions Given:  yes Post Pull Pulses Present: rt pt dopplered Dressing Applied:  Gauze and tegaderm Bedrest begins @ 1535 Comments:

## 2017-12-27 NOTE — Discharge Instructions (Signed)
Femoral Site Care °Refer to this sheet in the next few weeks. These instructions provide you with information about caring for yourself after your procedure. Your health care provider may also give you more specific instructions. Your treatment has been planned according to current medical practices, but problems sometimes occur. Call your health care provider if you have any problems or questions after your procedure. °What can I expect after the procedure? °After your procedure, it is typical to have the following: °· Bruising at the site that usually fades within 1-2 weeks. °· Blood collecting in the tissue (hematoma) that may be painful to the touch. It should usually decrease in size and tenderness within 1-2 weeks. ° °Follow these instructions at home: °· Take medicines only as directed by your health care provider. °· You may shower 24-48 hours after the procedure or as directed by your health care provider. Remove the bandage (dressing) and gently wash the site with plain soap and water. Pat the area dry with a clean towel. Do not rub the site, because this may cause bleeding. °· Do not take baths, swim, or use a hot tub until your health care provider approves. °· Check your insertion site every day for redness, swelling, or drainage. °· Do not apply powder or lotion to the site. °· Limit use of stairs to twice a day for the first 2-3 days or as directed by your health care provider. °· Do not squat for the first 2-3 days or as directed by your health care provider. °· Do not lift over 10 lb (4.5 kg) for 5 days after your procedure or as directed by your health care provider. °· Ask your health care provider when it is okay to: °? Return to work or school. °? Resume usual physical activities or sports. °? Resume sexual activity. °· Do not drive home if you are discharged the same day as the procedure. Have someone else drive you. °· You may drive 24 hours after the procedure unless otherwise instructed by  your health care provider. °· Do not operate machinery or power tools for 24 hours after the procedure or as directed by your health care provider. °· If your procedure was done as an outpatient procedure, which means that you went home the same day as your procedure, a responsible adult should be with you for the first 24 hours after you arrive home. °· Keep all follow-up visits as directed by your health care provider. This is important. °Contact a health care provider if: °· You have a fever. °· You have chills. °· You have increased bleeding from the site. Hold pressure on the site. °Get help right away if: °· You have unusual pain at the site. °· You have redness, warmth, or swelling at the site. °· You have drainage (other than a small amount of blood on the dressing) from the site. °· The site is bleeding, and the bleeding does not stop after 30 minutes of holding steady pressure on the site. °· Your leg or foot becomes pale, cool, tingly, or numb. °This information is not intended to replace advice given to you by your health care provider. Make sure you discuss any questions you have with your health care provider. °Document Released: 12/28/2013 Document Revised: 10/02/2015 Document Reviewed: 11/13/2013 °Elsevier Interactive Patient Education © 2018 [employer] °Moderate Conscious Sedation, Adult, Care After °These instructions provide you with information about caring for yourself after your procedure. Your health care provider may also give you more   specific instructions. Your treatment has been planned according to current medical practices, but problems sometimes occur. Call your health care provider if you have any problems or questions after your procedure. °What can I expect after the procedure? °After your procedure, it is common: °· To feel sleepy for several hours. °· To feel clumsy and have poor balance for several hours. °· To have poor judgment for several hours. °· To vomit if you eat too  soon. ° °Follow these instructions at home: °For at least 24 hours after the procedure: ° °· Do not: °? Participate in activities where you could fall or become injured. °? Drive. °? Use heavy machinery. °? Drink alcohol. °? Take sleeping pills or medicines that cause drowsiness. °? Make important decisions or sign legal documents. °? Take care of children on your own. °· Rest. °Eating and drinking °· Follow the diet recommended by your health care provider. °· If you vomit: °? Drink water, juice, or soup when you can drink without vomiting. °? Make sure you have little or no nausea before eating solid foods. °General instructions °· Have a responsible adult stay with you until you are awake and alert. °· Take over-the-counter and prescription medicines only as told by your health care provider. °· If you smoke, do not smoke without supervision. °· Keep all follow-up visits as told by your health care provider. This is important. °Contact a health care provider if: °· You keep feeling nauseous or you keep vomiting. °· You feel light-headed. °· You develop a rash. °· You have a fever. °Get help right away if: °· You have trouble breathing. °This information is not intended to replace advice given to you by your health care provider. Make sure you discuss any questions you have with your health care provider. °Document Released: 02/14/2013 Document Revised: 09/29/2015 Document Reviewed: 08/16/2015 °Elsevier Interactive Patient Education © 2018 [employer] ° °

## 2017-12-27 NOTE — Op Note (Signed)
    Patient name: Lauren King MRN: [id number] DOB: [birth year] Sex: female  12/27/2017 Pre-operative Diagnosis: claudication Post-operative diagnosis:  Same Surgeon:  Durene CalWells Lari Linson Procedure Performed:  1.  U/s guided access, right femoral artery  2.  Abdominal aortogram  3.  Bilateral lower extremity runoff  4.  Second-order catheterization  5.  Conscious sedation (35 minutes)  Indications: Patient presents with bilateral leg pain, left greater than right.  Preoperative ultrasound study suggested vascular disease.  She is here for further evaluation and possible intervention.  Procedure:  The patient was identified in the holding area and taken to room 8.  The patient was then placed supine on the table and prepped and draped in the usual sterile fashion.  A time out was called.  Conscious sedation was administered with the use of IV fentanyl and Versed under continuous physician and nurse monitoring.  Heart rate, blood pressure, and oxygen saturation were continuously monitored. Ultrasound was used to evaluate the right common femoral artery.  It was patent .   A digital ultrasound image was acquired.  A micropuncture needle was used to access the right common femoral artery under ultrasound guidance.  An 018 wire was advanced without resistance and a micropuncture sheath was placed.  The 018 wire was removed and a benson wire was placed.  The micropuncture sheath was exchanged for a 5 french sheath.  An omniflush catheter was advanced over the wire to the level of L-1.  An abdominal angiogram was obtained.  Next, using the omniflush catheter and a benson wire, the aortic bifurcation was crossed and the catheter was placed into theleft external iliac artery and left runoff was obtained.  right runoff was performed via retrograde sheath injections.  Findings:   Aortogram: No significant renal artery stenosis was identified.  The infrarenal abdominal aorta is widely patent.  Bilateral common  iliac arteries are widely patent.  There is mild to moderate stenosis within the right external iliac artery.  The left external iliac artery is widely patent.  Iliac arteries are small in caliber.  Right Lower Extremity: Right common femoral and profundofemoral artery widely patent.  The right superficial femoral artery is patent however there is an 80% stenosis within the adductor canal.  Popliteal arteries patent throughout its course.  The dominant runoff is the peroneal artery.  Left Lower Extremity: The left common femoral and profundofemoral artery widely patent.  The superficial femoral artery has a short segment occlusion at the adductor canal.  There is reconstitution of the above-knee popliteal artery.  The below-knee popliteal artery then occludes.  This is a short segment occlusion.  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  #3  Vessels are very small, which tends to make percutaneous intervention have limited durability.  Discussed with Dr. Arbie CookeyEarly.  Will hold off on surgical intervention which would be a below knee bypass until her symptoms progress   V. Durene CalWells Marce Schartz, M.D. [facility] [place] Office: [phone] Pager:  [phone]

## 2017-12-28 ENCOUNTER — Encounter (HOSPITAL_COMMUNITY): Payer: Self-pay | Admitting: Surgery

## 2017-12-28 ENCOUNTER — Telehealth: Payer: Self-pay | Admitting: Surgery

## 2017-12-28 MED FILL — Heparin Sod (Porcine)-NaCl IV Soln 1000 Unit/500ML-0.9%: INTRAVENOUS | Qty: 1000 | Status: AC

## 2017-12-28 NOTE — Telephone Encounter (Signed)
sch appt VM NA mld ltr 03/27/18 1045am f/u MD

## 2018-03-27 ENCOUNTER — Other Ambulatory Visit: Payer: Self-pay

## 2018-03-27 ENCOUNTER — Ambulatory Visit (INDEPENDENT_AMBULATORY_CARE_PROVIDER_SITE_OTHER): Payer: Medicare HMO | Admitting: Surgery

## 2018-03-27 ENCOUNTER — Encounter: Payer: Self-pay | Admitting: Surgery

## 2018-03-27 VITALS — BP 128/72 | HR 69 | Resp 18 | Ht 61.0 in | Wt 83.0 lb

## 2018-03-27 DIAGNOSIS — I739 Peripheral vascular disease, unspecified: Secondary | ICD-10-CM | POA: Diagnosis not present

## 2018-03-27 MED ORDER — CILOSTAZOL 100 MG PO TABS: 100.0000 mg | ORAL_TABLET | Freq: Two times a day (BID) | ORAL | 11 refills | Status: DC

## 2018-03-27 NOTE — Progress Notes (Signed)
[facility] [place]  Patient name: Lauren King MRN: [id number] DOB: 03/24/1951 Sex: female   REASON FOR VISIT:    Follow up  [name]:    Lauren King is a 67 y.o. female who was initially seen by Dr. Arbie CookeyEarly and scheduled for angiogram which she underwent on 12/27/2017.  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  Because of the small caliber of her vessels, percutaneous intervention was not recommended.  She is back today for follow-up.  She continues to have symptoms in the left leg more so than the right at approximately 50 feet.  She continues to smoke.  She does not have any open wounds or rest pain.   PAST MEDICAL HISTORY:   Past Medical History:  Diagnosis Date  . Anxiety   . Chronic lower back pain   . COPD (chronic obstructive pulmonary disease) (HCC)   . Depression   . GERD (gastroesophageal reflux disease)   . Obstructive sleep apnea    CPAP     FAMILY HISTORY:   Family History  Problem Relation Age of Onset  . Lung cancer Sister   . Lung cancer Brother   . Colon cancer Neg Hx     SOCIAL HISTORY:   Social History   Tobacco Use  . Smoking status: Current Every Day Smoker    Packs/day: 0.40    Years: 40.00    Pack years: 16.00    Types: Cigarettes  . Smokeless tobacco: Never Used  . Tobacco comment: 5 a day  Substance Use Topics  . Alcohol use: No     ALLERGIES:   No Known Allergies   CURRENT MEDICATIONS:   Current Outpatient Medications  Medication Sig Dispense Refill  . diphenhydramine-acetaminophen (TYLENOL PM) 25-500 MG TABS tablet Take 2 tablets by mouth at bedtime as needed (sleep).    . gabapentin (NEURONTIN) 100 MG capsule Take 1 capsule (100 mg total) by mouth 3 (three) times daily. (Patient taking differently:  Take 100 mg by mouth 2 (two) times daily. ) 90 capsule 3  . ibuprofen (ADVIL,MOTRIN) 800 MG tablet Take 800 mg by mouth 2 (two) times daily.    . traMADol (ULTRAM) 50 MG tablet Take 100 mg by mouth 2 (two) times daily.     . TRELEGY ELLIPTA 100-62.5-25 MCG/INH AEPB Inhale 1 puff into the lungs daily.   12   No current facility-administered medications for this visit.     REVIEW OF SYSTEMS:   [X]  denotes positive finding, [ ]  denotes negative finding Cardiac  Comments:  Chest pain or chest pressure:    Shortness of breath upon exertion:    Short of breath when lying flat:    Irregular heart rhythm:        Vascular    Pain in calf, thigh, or hip brought on by ambulation: x   Pain in feet at night that wakes you up from your sleep:     Blood clot in your veins:    Leg swelling:         Pulmonary    Oxygen at home:    Productive cough:     Wheezing:         Neurologic    Sudden weakness in arms or legs:     Sudden numbness in arms or legs:  Sudden onset of difficulty speaking or slurred speech:    Temporary loss of vision in one eye:     Problems with dizziness:         Gastrointestinal    Blood in stool:     Vomited blood:         Genitourinary    Burning when urinating:     Blood in urine:        Psychiatric    Major depression:         Hematologic    Bleeding problems:    Problems with blood clotting too easily:        Skin    Rashes or ulcers:        Constitutional    Fever or chills:      PHYSICAL EXAM:   Vitals:   03/27/18 1132  BP: 128/72  Pulse: 69  Resp: 18  SpO2: 94%  Weight: 83 lb (37.6 kg)  Height: 5\' 1"  (1.549 m)    GENERAL: The patient is a well-nourished female, in no acute distress. The vital signs are documented above. CARDIAC: There is a regular rate and rhythm.  VASCULAR: Nonpalpable pedal pulses PULMONARY: Non-labored respirations  MUSCULOSKELETAL: There are no major deformities or cyanosis. NEUROLOGIC: No focal weakness or  paresthesias are detected. SKIN: There are no ulcers or rashes noted. PSYCHIATRIC: The patient has a normal affect.  STUDIES:   None  MEDICAL ISSUES:   Claudication: Patient symptoms remain stable.  She does not have open wounds or rest pain.  I do not think she is a great percutaneous candidate because of the size of her vessels.  She could certainly be revascularized percutaneously, and is not sure about the longevity of the results.  For that reason, I have encouraged her to continue with an exercise program, and an attempt at smoking cessation.  I will start her on cilostazol to see if this has any benefit.  I told her that if she does not experience any improvement in her symptoms to discontinue it after 1 month.  She will come back in 6 months for follow-up.    [name], MD [facility] [phone] Pager [phone]

## 2018-03-30 ENCOUNTER — Other Ambulatory Visit (HOSPITAL_BASED_OUTPATIENT_CLINIC_OR_DEPARTMENT_OTHER): Payer: Self-pay

## 2018-03-30 DIAGNOSIS — G4730 Sleep apnea, unspecified: Secondary | ICD-10-CM

## 2018-06-02 ENCOUNTER — Ambulatory Visit: Payer: Medicare HMO | Attending: Pulmonary Disease | Admitting: Neurology

## 2018-06-02 DIAGNOSIS — G4730 Sleep apnea, unspecified: Secondary | ICD-10-CM

## 2018-06-21 NOTE — Procedures (Signed)
[facility] Lauren King A. Lauren Pilgrimoonquah, MD     [email or web address]             NOCTURNAL POLYSOMNOGRAPHY   LOCATION: [facility]  Patient Name: Lauren King, Lauren King Study Date: 06/02/2018 Gender: Female D.O.B: 08-06-1950 Age (years): 67 Referring Provider: Kari BaarsEdward King Height (inches): 61 Interpreting Physician: Lauren BeamsKofi Randel Hargens MD, ABSM Weight (lbs): 82 RPSGT: Lauren King, Lauren King BMI: 15 MRN: [id number] Neck Size: 12.50 CLINICAL INFORMATION Sleep Study Type: NPSG     Indication for sleep study: N/A     Epworth Sleepiness Score: 3     SLEEP STUDY TECHNIQUE As per the AASM Manual for the Scoring of Sleep and Associated Events v2.3 (April 2016) with a hypopnea requiring 4% desaturations.  The channels recorded and monitored were frontal, central and occipital EEG, electrooculogram (EOG), submentalis EMG (chin), nasal and oral airflow, thoracic and abdominal wall motion, anterior tibialis EMG, snore microphone, electrocardiogram, and pulse oximetry.  MEDICATIONS Medications self-administered by patient taken the night of the study : N/A  Current Outpatient Medications:  .  cilostazol (PLETAL) 100 MG tablet, Take 1 tablet (100 mg total) by mouth 2 (two) times daily before a meal., Disp: 60 tablet, Rfl: 11 .  diphenhydramine-acetaminophen (TYLENOL PM) 25-500 MG TABS tablet, Take 2 tablets by mouth at bedtime as needed (sleep)., Disp: , Rfl:  .  gabapentin (NEURONTIN) 100 MG capsule, Take 1 capsule (100 mg total) by mouth 3 (three) times daily. (Patient taking differently: Take 100 mg by mouth 2 (two) times daily. ), Disp: 90 capsule, Rfl: 3 .  ibuprofen (ADVIL,MOTRIN) 800 MG tablet, Take 800 mg by mouth 2 (two) times daily., Disp: , Rfl:  .  traMADol (ULTRAM) 50 MG tablet, Take 100 mg by mouth 2 (two) times daily. , Disp: , Rfl:  .  TRELEGY ELLIPTA 100-62.5-25 MCG/INH AEPB, Inhale 1 puff into the lungs daily. , Disp: , Rfl: 12     SLEEP ARCHITECTURE The study was initiated at  10:00:53 PM and ended at 4:41:28 AM.  Sleep onset time was 7.6 minutes and the sleep efficiency was 89.5%%. The total sleep time was 358.5 minutes.  Stage REM latency was 212.5 minutes.  The patient spent 3.3%% of the night in stage N1 sleep, 82.3%% in stage N2 sleep, 0.8%% in stage N3 and 13.5% in REM.  Alpha intrusion was absent.  Supine sleep was 13.25%.  RESPIRATORY PARAMETERS The overall apnea/hypopnea index (AHI) was 0.0 per hour. There were 0 total apneas, including 0 obstructive, 0 central and 0 mixed apneas. There were 0 hypopneas and 8 RERAs.  The AHI during Stage REM sleep was 0.0 per hour.  AHI while supine was 0.0 per hour.  The mean oxygen saturation was 88.4%. The minimum SpO2 during sleep was 84.0%. The patient is noted to have extensive desaturations without obstructive events.  The patient spent 196 minutes with a saturation below 88%.  moderate snoring was noted during this study.  CARDIAC DATA The 2 lead EKG demonstrated sinus rhythm. The mean heart rate was 50.6 beats per minute. Other EKG findings include: PVCs.  LEG MOVEMENT DATA The total PLMS were 0 with a resulting PLMS index of 0.0. Associated arousal with leg movement index was 0.0.  IMPRESSIONS 1. Hypoventilation syndrome is documented with this study.  Consider nocturnal oxygen 2 L while sleeping. 2. No significant obstructive sleep apnea occurred during this study.   Lauren RammingKofi A Lorin Gawron, MD Diplomate, American Board of Sleep Medicine.   ELECTRONICALLY SIGNED ON:  06/21/2018, 10:51 AM [facility] PH: [phone]   FX: [phone] Lauren King

## 2018-08-17 ENCOUNTER — Ambulatory Visit (HOSPITAL_COMMUNITY): Admission: RE | Admit: 2018-08-17 | Payer: Medicare HMO | Source: Ambulatory Visit

## 2018-09-05 ENCOUNTER — Ambulatory Visit (HOSPITAL_COMMUNITY): Admission: RE | Admit: 2018-09-05 | Payer: Medicare HMO | Source: Ambulatory Visit

## 2018-09-15 DIAGNOSIS — J449 Chronic obstructive pulmonary disease, unspecified: Secondary | ICD-10-CM | POA: Diagnosis not present

## 2018-09-15 DIAGNOSIS — M539 Dorsopathy, unspecified: Secondary | ICD-10-CM | POA: Diagnosis not present
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
## 2018-09-25 ENCOUNTER — Ambulatory Visit (INDEPENDENT_AMBULATORY_CARE_PROVIDER_SITE_OTHER): Payer: Medicare HMO | Admitting: Psychiatry

## 2018-09-25 ENCOUNTER — Other Ambulatory Visit: Payer: Self-pay

## 2018-09-25 ENCOUNTER — Encounter (HOSPITAL_COMMUNITY): Payer: Self-pay | Admitting: Psychiatry

## 2018-09-25 DIAGNOSIS — F331 Major depressive disorder, recurrent, moderate: Secondary | ICD-10-CM

## 2018-09-25 MED ORDER — MIRTAZAPINE 15 MG PO TABS: ORAL_TABLET | ORAL | 0 refills | Status: DC

## 2018-09-25 NOTE — Patient Instructions (Signed)
1. Start mirtazapine 7.5 mg at night for one week, then 15 mg at night  2. Referral to therapy  3. Next appointment: 6/15 at 9:30

## 2018-09-26 DIAGNOSIS — J449 Chronic obstructive pulmonary disease, unspecified: Secondary | ICD-10-CM | POA: Diagnosis not present

## 2018-09-26 DIAGNOSIS — G4733 Obstructive sleep apnea (adult) (pediatric): Secondary | ICD-10-CM | POA: Diagnosis not present

## 2018-10-03 ENCOUNTER — Telehealth (HOSPITAL_COMMUNITY): Payer: Self-pay | Admitting: *Deleted

## 2018-10-03 NOTE — Telephone Encounter (Signed)
PATIENT STATES IT'S ON HER AVS TO STOP TAKING TRELEGY (ELLIPTA) WHICH SHE SAY'S  is a prescription medicine used long term to treat chronic obstructive pulmonary disease (COPD),   I LOOKED & DIDN'T SEE THIS ON THE AVS

## 2018-10-03 NOTE — Telephone Encounter (Signed)
Please contact the patient and ask what medication she meant; I am not aware of any medication called Trillegy. I also do not prescribe Tramadol or any pain medication; advise her to discuss with her provider.

## 2018-10-03 NOTE — Telephone Encounter (Signed)
DONE:  Please instruct her to continue medication she is given by other provider, and continue mirtazapine as discussed at her last visit.

## 2018-10-03 NOTE — Telephone Encounter (Signed)
Please instruct her to continue medication she is given by other provider, and continue mirtazapine as discussed at her last visit.

## 2018-10-03 NOTE — Telephone Encounter (Signed)
Dr Vanetta Shawl Patient called & LVM stating that she needs you to call her because she doesn't understand why you took her off the medication (Trillegy) the doctor ( Dr Juanetta Gosling) says she needs. And that the tramadol needed for severe back & leg pain they will not continue to refill?? # [phone]

## 2018-10-05 ENCOUNTER — Encounter (HOSPITAL_COMMUNITY): Payer: Self-pay | Admitting: Licensed Clinical Social Worker

## 2018-10-05 ENCOUNTER — Other Ambulatory Visit: Payer: Self-pay

## 2018-10-05 ENCOUNTER — Ambulatory Visit (INDEPENDENT_AMBULATORY_CARE_PROVIDER_SITE_OTHER): Payer: Medicare HMO | Admitting: Licensed Clinical Social Worker

## 2018-10-05 DIAGNOSIS — F331 Major depressive disorder, recurrent, moderate: Secondary | ICD-10-CM
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
## 2018-10-07 DIAGNOSIS — J449 Chronic obstructive pulmonary disease, unspecified: Secondary | ICD-10-CM | POA: Diagnosis not present
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
## 2018-10-23 ENCOUNTER — Other Ambulatory Visit: Payer: Self-pay

## 2018-10-23 ENCOUNTER — Encounter (HOSPITAL_COMMUNITY): Payer: Self-pay | Admitting: Psychiatry

## 2018-10-23 ENCOUNTER — Ambulatory Visit (INDEPENDENT_AMBULATORY_CARE_PROVIDER_SITE_OTHER): Payer: Medicare HMO | Admitting: Psychiatry

## 2018-10-23 DIAGNOSIS — F331 Major depressive disorder, recurrent, moderate: Secondary | ICD-10-CM | POA: Diagnosis not present

## 2018-10-23 MED ORDER — MIRTAZAPINE 15 MG PO TABS: 15.0000 mg | ORAL_TABLET | Freq: Every day | ORAL | 0 refills | Status: DC

## 2018-10-23 NOTE — Patient Instructions (Addendum)
1. Continue mirtazapine 15 mg at night   2. Next appointment:  7/27 at 9 AM for 30 mins, video 3. Check TSH with Dr. Legrand Rams if that is not recently done (or we can order the blood test)

## 2018-10-24 ENCOUNTER — Encounter (HOSPITAL_COMMUNITY): Payer: Self-pay | Admitting: Licensed Clinical Social Worker

## 2018-10-24 ENCOUNTER — Other Ambulatory Visit: Payer: Self-pay

## 2018-10-24 ENCOUNTER — Ambulatory Visit (INDEPENDENT_AMBULATORY_CARE_PROVIDER_SITE_OTHER): Payer: Medicare HMO | Admitting: Licensed Clinical Social Worker

## 2018-10-24 DIAGNOSIS — F331 Major depressive disorder, recurrent, moderate: Secondary | ICD-10-CM | POA: Diagnosis not present
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
## 2018-10-27 DIAGNOSIS — J449 Chronic obstructive pulmonary disease, unspecified: Secondary | ICD-10-CM | POA: Diagnosis not present

## 2018-10-27 DIAGNOSIS — G4733 Obstructive sleep apnea (adult) (pediatric): Secondary | ICD-10-CM | POA: Diagnosis not present

## 2018-11-07 DIAGNOSIS — E43 Unspecified severe protein-calorie malnutrition: Secondary | ICD-10-CM | POA: Diagnosis not present

## 2018-11-07 DIAGNOSIS — F339 Major depressive disorder, recurrent, unspecified: Secondary | ICD-10-CM | POA: Diagnosis not present

## 2018-11-07 DIAGNOSIS — M79605 Pain in left leg: Secondary | ICD-10-CM | POA: Diagnosis not present

## 2018-11-07 DIAGNOSIS — J449 Chronic obstructive pulmonary disease, unspecified: Secondary | ICD-10-CM | POA: Diagnosis not present

## 2018-11-09 ENCOUNTER — Telehealth (HOSPITAL_COMMUNITY): Payer: Self-pay | Admitting: Rehabilitation

## 2018-11-09 NOTE — Telephone Encounter (Signed)

## 2018-11-13 ENCOUNTER — Other Ambulatory Visit: Payer: Self-pay

## 2018-11-13 ENCOUNTER — Encounter: Payer: Self-pay | Admitting: *Deleted

## 2018-11-13 ENCOUNTER — Encounter: Payer: Self-pay | Admitting: Surgery

## 2018-11-13 ENCOUNTER — Ambulatory Visit (INDEPENDENT_AMBULATORY_CARE_PROVIDER_SITE_OTHER): Payer: Medicare HMO | Admitting: Surgery

## 2018-11-13 ENCOUNTER — Other Ambulatory Visit: Payer: Self-pay | Admitting: *Deleted

## 2018-11-13 VITALS — BP 144/78 | HR 61 | Temp 97.4°F | Resp 18 | Ht 61.0 in | Wt 87.8 lb

## 2018-11-13 DIAGNOSIS — I70213 Atherosclerosis of native arteries of extremities with intermittent claudication, bilateral legs: Secondary | ICD-10-CM | POA: Diagnosis not present

## 2018-11-13 MED ORDER — ROSUVASTATIN CALCIUM 5 MG PO TABS: 5.0000 mg | ORAL_TABLET | Freq: Every day | ORAL | 3 refills | Status: DC

## 2018-11-13 NOTE — Progress Notes (Signed)
[facility] [place]  Patient name: Lauren King Delehanty MRN: [id number] DOB: [birth year] Sex: female   REASON FOR VISIT:    Follow up  [name]:    Lauren King Branscomb is a 68 y.o. female who was initially seen by Dr. Arbie CookeyEarly and scheduled for angiogram which she underwent on 12/27/2017.  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  Because of the small caliber of her vessels, percutaneous intervention was not recommended. At her last visit we discussed an exercise program, smoking cessation and starting cilostazol  Today, her symptoms have progressed.  She now is experiencing rest pain almost every night.  She is not getting any relief.  She does not have any open wounds.  She states that she has quit smoking.  She is not taking a statin or an aspirin.  She has COPD and sleep apnea and uses a CPAP   PAST MEDICAL HISTORY:   Past Medical History:  Diagnosis Date  . Anxiety   . Chronic lower back pain   . COPD (chronic obstructive pulmonary disease) ([facility])   . Depression   . GERD (gastroesophageal reflux disease)   . Obstructive sleep apnea    CPAP     FAMILY HISTORY:   Family History  Problem Relation Age of Onset  . Lung cancer Sister   . Lung cancer Brother   . Colon cancer Neg Hx     SOCIAL HISTORY:   Social History   Tobacco Use  . Smoking status: Former Smoker    Packs/day: 0.40    Years: 40.00    Pack years: 16.00    Types: Cigarettes  . Smokeless tobacco: Never Used  . Tobacco comment: 5 a day  Substance Use Topics  . Alcohol use: No     ALLERGIES:   No Known Allergies   CURRENT MEDICATIONS:   Current Outpatient Medications  Medication Sig Dispense Refill  . albuterol (PROVENTIL) (2.5 MG/3ML) 0.083% nebulizer solution     . albuterol (VENTOLIN  HFA) 108 (90 Base) MCG/ACT inhaler INHALE TWO PUFFS BY MOUTH FOUR TIMES DAILY AS NEEDED    . ibuprofen (ADVIL,MOTRIN) 800 MG tablet Take 800 mg by mouth 2 (two) times daily.    [name]. ipratropium (ATROVENT) 0.02 % nebulizer solution     . mirtazapine (REMERON) 15 MG tablet Take 1 tablet (15 mg total) by mouth at bedtime. 90 tablet 0  . predniSONE (DELTASONE) 20 MG tablet TAKE 1 TABLET BY MOUTH DAILY FOR SEVEN DAYS    . traMADol (ULTRAM) 50 MG tablet Take 100 mg by mouth 2 (two) times daily.      No current facility-administered medications for this visit.     REVIEW OF SYSTEMS:   [X]  denotes positive finding, [ ]  denotes negative finding Cardiac  Comments:  Chest pain or chest pressure:    Shortness of breath upon exertion:    Short of breath when lying flat:    Irregular heart rhythm:        Vascular    Pain in calf, thigh, or hip brought on by ambulation: x   Pain in feet at night that wakes you up from your sleep:     Blood clot in your veins:    Leg swelling:         Pulmonary    Oxygen at home:  Productive cough:     Wheezing:         Neurologic    Sudden weakness in arms or legs:     Sudden numbness in arms or legs:     Sudden onset of difficulty speaking or slurred speech:    Temporary loss of vision in one eye:     Problems with dizziness:         Gastrointestinal    Blood in stool:     Vomited blood:         Genitourinary    Burning when urinating:     Blood in urine:        Psychiatric    Major depression:         Hematologic    Bleeding problems:    Problems with blood clotting too easily:        Skin    Rashes or ulcers:        Constitutional    Fever or chills:      PHYSICAL EXAM:   There were no vitals filed for this visit.  GENERAL: The patient is a well-nourished female, in no acute distress. The vital signs are documented above. CARDIAC: There is a regular rate and rhythm.  VASCULAR: Nonpalpable pedal pulses PULMONARY: Non-labored  respirations ABDOMEN: Soft and non-tender with normal pitched bowel sounds.  MUSCULOSKELETAL: There are no major deformities or cyanosis. NEUROLOGIC: No focal weakness or paresthesias are detected. SKIN: There are no ulcers or rashes noted. PSYCHIATRIC: The patient has a normal affect.  STUDIES:   none  MEDICAL ISSUES:   Bilateral rest pain: We initially elected to try and treat her medically because of the extent of her blockage and the fact that she had very small arteries.  Unfortunately, her symptoms have progressed to where she is now having rest pain.  She was however able to quit smoking.  Because of her symptoms, I feel that intervention is necessary in order to prevent limb loss.  I have recommended proceeding with angiography via a right femoral access and focus on the left leg.  I will try to treat her percutaneously however the durability of these interventions may be limited because of the small size of her vessels.  This has been scheduled for July 14  I am starting her on Crestor 5mg  and ASA 81mg  today    [name], IV, MD, FACS [facility] [phone] Pager [phone]

## 2018-11-14 DIAGNOSIS — H18413 Arcus senilis, bilateral: Secondary | ICD-10-CM | POA: Diagnosis not present

## 2018-11-14 DIAGNOSIS — H25041 Posterior subcapsular polar age-related cataract, right eye: Secondary | ICD-10-CM | POA: Diagnosis not present

## 2018-11-14 DIAGNOSIS — H2511 Age-related nuclear cataract, right eye: Secondary | ICD-10-CM | POA: Diagnosis not present

## 2018-11-17 ENCOUNTER — Other Ambulatory Visit: Payer: Self-pay

## 2018-11-17 ENCOUNTER — Other Ambulatory Visit (HOSPITAL_COMMUNITY)
Admission: RE | Admit: 2018-11-17 | Discharge: 2018-11-17 | Disposition: A | Discharge status: Home / Self Care | Payer: Medicare HMO | Source: Ambulatory Visit | Attending: Surgery | Admitting: Surgery

## 2018-11-17 DIAGNOSIS — Z01812 Encounter for preprocedural laboratory examination: Secondary | ICD-10-CM | POA: Diagnosis not present

## 2018-11-17 DIAGNOSIS — Z1159 Encounter for screening for other viral diseases: Secondary | ICD-10-CM | POA: Diagnosis not present

## 2018-11-17 LAB — SARS CORONAVIRUS 2 (TAT 6-24 HRS): SARS Coronavirus 2: NEGATIVE

## 2018-11-20 ENCOUNTER — Other Ambulatory Visit: Payer: Self-pay

## 2018-11-20 ENCOUNTER — Ambulatory Visit (HOSPITAL_COMMUNITY): Payer: Medicare HMO | Admitting: Licensed Clinical Social Worker

## 2018-11-20 ENCOUNTER — Telehealth (HOSPITAL_COMMUNITY): Payer: Self-pay | Admitting: Licensed Clinical Social Worker

## 2018-11-20 NOTE — Telephone Encounter (Signed)
Attempted to connected twice for video session. No response.

## 2018-11-21 ENCOUNTER — Encounter (HOSPITAL_COMMUNITY): Payer: Self-pay | Admitting: Surgery

## 2018-11-21 ENCOUNTER — Other Ambulatory Visit: Payer: Self-pay

## 2018-11-21 ENCOUNTER — Encounter (HOSPITAL_COMMUNITY)
Admission: RE | Disposition: A | Discharge status: Home / Self Care | Payer: Self-pay | Source: Home / Self Care | Attending: Surgery

## 2018-11-21 ENCOUNTER — Telehealth: Payer: Self-pay | Admitting: *Deleted

## 2018-11-21 ENCOUNTER — Other Ambulatory Visit: Payer: Self-pay | Admitting: *Deleted

## 2018-11-21 ENCOUNTER — Ambulatory Visit (HOSPITAL_COMMUNITY)
Admission: RE | Admit: 2018-11-21 | Discharge: 2018-11-21 | Disposition: A | Discharge status: Home / Self Care | Payer: Medicare HMO | Attending: Surgery | Admitting: Surgery

## 2018-11-21 DIAGNOSIS — K219 Gastro-esophageal reflux disease without esophagitis: Secondary | ICD-10-CM | POA: Insufficient documentation

## 2018-11-21 DIAGNOSIS — G4733 Obstructive sleep apnea (adult) (pediatric): Secondary | ICD-10-CM | POA: Insufficient documentation

## 2018-11-21 DIAGNOSIS — Z79899 Other long term (current) drug therapy: Secondary | ICD-10-CM | POA: Diagnosis not present

## 2018-11-21 DIAGNOSIS — Z87891 Personal history of nicotine dependence: Secondary | ICD-10-CM | POA: Diagnosis not present

## 2018-11-21 DIAGNOSIS — I70223 Atherosclerosis of native arteries of extremities with rest pain, bilateral legs: Secondary | ICD-10-CM | POA: Insufficient documentation

## 2018-11-21 DIAGNOSIS — I70213 Atherosclerosis of native arteries of extremities with intermittent claudication, bilateral legs: Secondary | ICD-10-CM

## 2018-11-21 DIAGNOSIS — J449 Chronic obstructive pulmonary disease, unspecified: Secondary | ICD-10-CM | POA: Diagnosis not present

## 2018-11-21 HISTORY — PX: LOWER EXTREMITY ANGIOGRAPHY: CATH118251

## 2018-11-21 LAB — POCT I-STAT, CHEM 8
BUN: 15 mg/dL (ref 8–23)
Calcium, Ion: 1.12 mmol/L — ABNORMAL LOW (ref 1.15–1.40)
Chloride: 107 mmol/L (ref 98–111)
Creatinine, Ser: 0.5 mg/dL (ref 0.44–1.00)
Glucose, Bld: 96 mg/dL (ref 70–99)
HCT: 41 % (ref 36.0–46.0)
Hemoglobin: 13.9 g/dL (ref 12.0–15.0)
Potassium: 4 mmol/L (ref 3.5–5.1)
Sodium: 138 mmol/L (ref 135–145)
TCO2: 24 mmol/L (ref 22–32)

## 2018-11-21 SURGERY — LOWER EXTREMITY ANGIOGRAPHY
Anesthesia: LOCAL

## 2018-11-21 MED ORDER — OXYCODONE HCL 5 MG PO TABS: 5.0000 mg | ORAL_TABLET | ORAL | Status: DC | PRN

## 2018-11-21 MED ORDER — LABETALOL HCL 5 MG/ML IV SOLN: 10.0000 mg | INTRAVENOUS | Status: DC | PRN

## 2018-11-21 MED ORDER — ONDANSETRON HCL 4 MG/2ML IJ SOLN: 4.0000 mg | Freq: Four times a day (QID) | INTRAMUSCULAR | Status: DC | PRN

## 2018-11-21 MED ORDER — SODIUM CHLORIDE 0.9 % WEIGHT BASED INFUSION: 1.0000 mL/kg/h | INTRAVENOUS | Status: DC

## 2018-11-21 MED ORDER — MIDAZOLAM HCL 2 MG/2ML IJ SOLN
INTRAMUSCULAR | Status: AC
  Filled 2018-11-21: qty 2

## 2018-11-21 MED ORDER — SODIUM CHLORIDE 0.9 % IV SOLN: INTRAVENOUS | Status: DC

## 2018-11-21 MED ORDER — SODIUM CHLORIDE 0.9 % IV SOLN: 250.0000 mL | INTRAVENOUS | Status: DC | PRN

## 2018-11-21 MED ORDER — FENTANYL CITRATE (PF) 100 MCG/2ML IJ SOLN
INTRAMUSCULAR | Status: AC
  Filled 2018-11-21: qty 2

## 2018-11-21 MED ORDER — FENTANYL CITRATE (PF) 100 MCG/2ML IJ SOLN
INTRAMUSCULAR | Status: DC | PRN
  Administered 2018-11-21: 25 ug via INTRAVENOUS

## 2018-11-21 MED ORDER — MIDAZOLAM HCL 2 MG/2ML IJ SOLN
INTRAMUSCULAR | Status: DC | PRN
  Administered 2018-11-21: 1 mg via INTRAVENOUS

## 2018-11-21 MED ORDER — HEPARIN (PORCINE) IN NACL 1000-0.9 UT/500ML-% IV SOLN
INTRAVENOUS | Status: AC
  Filled 2018-11-21: qty 500

## 2018-11-21 MED ORDER — HYDRALAZINE HCL 20 MG/ML IJ SOLN: 5.0000 mg | INTRAMUSCULAR | Status: DC | PRN

## 2018-11-21 MED ORDER — LIDOCAINE HCL (PF) 1 % IJ SOLN
INTRAMUSCULAR | Status: AC
  Filled 2018-11-21: qty 30

## 2018-11-21 MED ORDER — SODIUM CHLORIDE 0.9% FLUSH: 3.0000 mL | Freq: Two times a day (BID) | INTRAVENOUS | Status: DC

## 2018-11-21 MED ORDER — ACETAMINOPHEN 325 MG PO TABS
650.0000 mg | ORAL_TABLET | ORAL | Status: DC | PRN
  Filled 2018-11-21: qty 2

## 2018-11-21 MED ORDER — IODIXANOL 320 MG/ML IV SOLN
INTRAVENOUS | Status: DC | PRN
  Administered 2018-11-21: 120 mL via INTRA_ARTERIAL

## 2018-11-21 MED ORDER — MORPHINE SULFATE (PF) 10 MG/ML IV SOLN: 1.0000 mg | INTRAVENOUS | Status: DC | PRN

## 2018-11-21 MED ORDER — SODIUM CHLORIDE 0.9% FLUSH: 3.0000 mL | INTRAVENOUS | Status: DC | PRN

## 2018-11-21 MED ORDER — MORPHINE SULFATE (PF) 2 MG/ML IV SOLN
INTRAVENOUS | Status: AC
  Administered 2018-11-21: 1 mg via INTRAVENOUS
  Filled 2018-11-21: qty 1

## 2018-11-21 MED ORDER — LIDOCAINE HCL (PF) 1 % IJ SOLN
INTRAMUSCULAR | Status: DC | PRN
  Administered 2018-11-21: 15 mL via INTRADERMAL

## 2018-11-21 MED ORDER — HEPARIN (PORCINE) IN NACL 1000-0.9 UT/500ML-% IV SOLN
INTRAVENOUS | Status: DC | PRN
  Administered 2018-11-21 (×2): 500 mL

## 2018-11-21 SURGICAL SUPPLY — 10 items
CATH OMNI FLUSH 5F 65CM (CATHETERS) ×1 IMPLANT
CLOSURE MYNX CONTROL 5F (Vascular Products) ×1 IMPLANT
DRAPE ZERO GRAVITY STERILE (DRAPES) ×1 IMPLANT
KIT MICROPUNCTURE NIT STIFF (SHEATH) ×1 IMPLANT
KIT PV (KITS) ×2 IMPLANT
SHEATH PINNACLE 5F 10CM (SHEATH) ×1 IMPLANT
SYR MEDRAD MARK V 150ML (SYRINGE) ×1 IMPLANT
TRANSDUCER W/STOPCOCK (MISCELLANEOUS) ×2 IMPLANT
TRAY PV CATH (CUSTOM PROCEDURE TRAY) ×2 IMPLANT
WIRE BENTSON .035X145CM (WIRE) ×1 IMPLANT

## 2018-11-21 NOTE — Telephone Encounter (Signed)
-----   Message from Mena Goes, RN sent at 11/21/2018 10:14 AM EDT ----- Regarding: appt and preop labs and cardiac clearance  ----- Message ----- From: Serafina Mitchell, MD Sent: 11/21/2018  10:02 AM EDT To: [facility] Charge Pool  11/21/2018:  Surgeon:  [name] Procedure Performed:  1.  Ultrasound-guided access, right femoral artery  2.  Abdominal aortogram  3.  Bilateral lower extremity runoff  4.  Second-order catheterization  5.  Closure device (Mynx)  6.  Conscious sedation (minutes)     Please schedule the patient to see me the first Monday in August.  Prior to her visit she will need bilateral lower extremity vein mapping and a carotid duplex.  In addition she will need to be seen by cardiology for cardiac clearance for femoral-popliteal bypass graft

## 2018-11-21 NOTE — Op Note (Signed)
° ° °  Patient name: Lauren King MRN: [id number] DOB: 1950-06-23 Sex: female  11/21/2018 Pre-operative Diagnosis: Bilateral rest pain left greater than right Post-operative diagnosis:  Same Surgeon:  [name] Procedure Performed:  1.  Ultrasound-guided access, right femoral artery  2.  Abdominal aortogram  3.  Bilateral lower extremity runoff  4.  Second-order catheterization  5.  Closure device (Mynx)  6.  Conscious sedation (minutes)     Indications: The patient has had leg pain for a long time.  She last underwent angiography about 10 months ago where she was found to have small vessels and felt to not be a good candidate for percutaneous revascularization.  She has been managed medically but her symptoms have progressed and so she comes back for angiography today.  Procedure:  The patient was identified in the holding area and taken to room 8.  The patient was then placed supine on the table and prepped and draped in the usual sterile fashion.  A time out was called.  Conscious sedation was administered with the use of IV fentanyl and Versed under continuous physician and nurse monitoring.  Heart rate, blood pressure, and oxygen saturation were continuously monitored.  Total sedation time was 49 minutes.  Ultrasound was used to evaluate the right common femoral artery.  It was patent .  A digital ultrasound image was acquired.  A micropuncture needle was used to access the right common femoral artery under ultrasound guidance.  An 018 wire was advanced without resistance and a micropuncture sheath was placed.  The 018 wire was removed and a benson wire was placed.  The micropuncture sheath was exchanged for a 5 french sheath.  An omniflush catheter was advanced over the wire to the level of L-1.  An abdominal angiogram was obtained.  Next, using the omniflush catheter and a benson wire, the aortic bifurcation was crossed and the catheter was placed into theleft external iliac artery and  left runoff was obtained.  right runoff was performed via retrograde sheath injections.  Findings:   Aortogram: No significant renal artery stenosis was identified.  The infrarenal abdominal aorta is widely patent.  The left common and external iliac arteries are patent but small in caliber.  The right common iliac artery is patent throughout its course.  There is luminal narrowing within the left external iliac artery.  This potentially could be spasm however most likely represents progression of disease.  The vessel is very small.  Right Lower Extremity: The right common femoral and profundofemoral artery are patent throughout their course.  There has been interval occlusion of the superficial femoral artery with reconstitution of the above-knee popliteal artery and single-vessel runoff via the peroneal artery.  Left Lower Extremity: The left common femoral and profundofemoral artery are patent throughout their course.  The superficial femoral artery is occluded.  There is reconstitution of the tibioperoneal trunk with two-vessel runoff via the posterior tibial and peroneal artery.  The posterior tibial is the dominant runoff across the ankle.  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  Patient would best be treated with a right femoral to above-knee popliteal artery bypass.  #3  Left superficial femoral artery occlusion.  The patient was scheduled for left femoral to TP trunk bypass graft     V. [name], M.D., [facility] [place] Office: [phone] Pager:  [phone]

## 2018-11-21 NOTE — Discharge Instructions (Signed)
Femoral Site Care °This sheet gives you information about how to care for yourself after your procedure. Your health care provider may also give you more specific instructions. If you have problems or questions, contact your health care provider. °What can I expect after the procedure? °After the procedure, it is common to have: °· Bruising that usually fades within 1-2 weeks. °· Tenderness at the site. °Follow these instructions at home: °Wound care °· Follow instructions from your health care provider about how to take care of your insertion site. Make sure you: °? Wash your hands with soap and water before you change your bandage (dressing). If soap and water are not available, use hand sanitizer. °? Change your dressing as told by your health care provider. °? Leave stitches (sutures), skin glue, or adhesive strips in place. These skin closures may need to stay in place for 2 weeks or longer. If adhesive strip edges start to loosen and curl up, you may trim the loose edges. Do not remove adhesive strips completely unless your health care provider tells you to do that. °· Do not take baths, swim, or use a hot tub until your health care provider approves. °· You may shower 24-48 hours after the procedure or as told by your health care provider. °? Gently wash the site with plain soap and water. °? Pat the area dry with a clean towel. °? Do not rub the site. This may cause bleeding. °· Do not apply powder or lotion to the site. Keep the site clean and dry. °· Check your femoral site every day for signs of infection. Check for: °? Redness, swelling, or pain. °? Fluid or blood. °? Warmth. °? Pus or a bad smell. °Activity °· For the first 2-3 days after your procedure, or as long as directed: °? Avoid climbing stairs as much as possible. °? Do not squat. °· Do not lift anything that is heavier than 10 lb (4.5 kg), or the limit that you are told, until your health care provider says that it is safe. °· Rest as  directed. °? Avoid sitting for a long time without moving. Get up to take short walks every 1-2 hours. °· Do not drive for 24 hours if you were given a medicine to help you relax (sedative). °General instructions °· Take over-the-counter and prescription medicines only as told by your health care provider. °· Keep all follow-up visits as told by your health care provider. This is important. °Contact a health care provider if you have: °· A fever or chills. °· You have redness, swelling, or pain around your insertion site. °Get help right away if: °· The catheter insertion area swells very fast. °· You pass out. °· You suddenly start to sweat or your skin gets clammy. °· The catheter insertion area is bleeding, and the bleeding does not stop when you hold steady pressure on the area. °· The area near or just beyond the catheter insertion site becomes pale, cool, tingly, or numb. °These symptoms may represent a serious problem that is an emergency. Do not wait to see if the symptoms will go away. Get medical help right away. Call your local emergency services (911 in the [place] Do not drive yourself to the hospital. °Summary °· After the procedure, it is common to have bruising that usually fades within 1-2 weeks. °· Check your femoral site every day for signs of infection. °· Do not lift anything that is heavier than 10 lb (4.5 kg), or the   limit that you are told, until your health care provider says that it is safe. °This information is not intended to replace advice given to you by your health care provider. Make sure you discuss any questions you have with your health care provider. °Document Released: 12/28/2013 Document Revised: 05/09/2017 Document Reviewed: 05/09/2017 °Elsevier Patient Education © 2020 [employer] ° °

## 2018-11-21 NOTE — Progress Notes (Signed)
Up and walked and right groin stable; no bleeding or hematoma 

## 2018-11-23 ENCOUNTER — Telehealth (HOSPITAL_COMMUNITY): Payer: Self-pay | Admitting: Licensed Clinical Social Worker

## 2018-11-23 NOTE — Telephone Encounter (Signed)
Patient did not respond to attempts to contact for session.

## 2018-11-26 DIAGNOSIS — J449 Chronic obstructive pulmonary disease, unspecified: Secondary | ICD-10-CM | POA: Diagnosis not present

## 2018-11-26 DIAGNOSIS — G4733 Obstructive sleep apnea (adult) (pediatric): Secondary | ICD-10-CM | POA: Diagnosis not present
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
## 2018-11-30 ENCOUNTER — Encounter (HOSPITAL_COMMUNITY): Payer: Self-pay | Admitting: Surgery

## 2018-12-01 ENCOUNTER — Encounter (HOSPITAL_COMMUNITY): Payer: Self-pay | Admitting: Psychiatry

## 2018-12-01 ENCOUNTER — Ambulatory Visit (INDEPENDENT_AMBULATORY_CARE_PROVIDER_SITE_OTHER): Payer: Medicare HMO | Admitting: Psychiatry

## 2018-12-01 ENCOUNTER — Other Ambulatory Visit: Payer: Self-pay

## 2018-12-01 DIAGNOSIS — F331 Major depressive disorder, recurrent, moderate: Secondary | ICD-10-CM | POA: Diagnosis not present

## 2018-12-01 MED ORDER — MIRTAZAPINE 30 MG PO TABS: 30.0000 mg | ORAL_TABLET | Freq: Every day | ORAL | 0 refills | Status: DC

## 2018-12-01 NOTE — Patient Instructions (Signed)
1. Increase mirtazapine 30 mg at night   2. Next appointment: 8/25 at 11:40

## 2018-12-04 ENCOUNTER — Ambulatory Visit (HOSPITAL_COMMUNITY): Payer: Medicare HMO | Admitting: Psychiatry

## 2018-12-04 DIAGNOSIS — H2511 Age-related nuclear cataract, right eye: Secondary | ICD-10-CM | POA: Diagnosis not present

## 2018-12-07 ENCOUNTER — Telehealth (HOSPITAL_COMMUNITY): Payer: Self-pay

## 2018-12-07 DIAGNOSIS — E7800 Pure hypercholesterolemia, unspecified: Secondary | ICD-10-CM | POA: Diagnosis not present

## 2018-12-07 DIAGNOSIS — J449 Chronic obstructive pulmonary disease, unspecified: Secondary | ICD-10-CM | POA: Diagnosis not present

## 2018-12-07 NOTE — Telephone Encounter (Signed)

## 2018-12-08 ENCOUNTER — Ambulatory Visit (INDEPENDENT_AMBULATORY_CARE_PROVIDER_SITE_OTHER)
Admit: 2018-12-08 | Discharge: 2018-12-08 | Disposition: A | Discharge status: Home / Self Care | Payer: Medicare HMO | Attending: Family | Admitting: Family

## 2018-12-08 ENCOUNTER — Ambulatory Visit (HOSPITAL_COMMUNITY)
Admission: RE | Admit: 2018-12-08 | Discharge: 2018-12-08 | Disposition: A | Discharge status: Home / Self Care | Payer: Medicare HMO | Source: Ambulatory Visit | Attending: Surgery | Admitting: Surgery

## 2018-12-08 ENCOUNTER — Other Ambulatory Visit: Payer: Self-pay

## 2018-12-08 DIAGNOSIS — I70213 Atherosclerosis of native arteries of extremities with intermittent claudication, bilateral legs: Secondary | ICD-10-CM

## 2018-12-11 ENCOUNTER — Ambulatory Visit (HOSPITAL_COMMUNITY): Payer: Medicare HMO | Admitting: Licensed Clinical Social Worker

## 2018-12-11 ENCOUNTER — Ambulatory Visit (INDEPENDENT_AMBULATORY_CARE_PROVIDER_SITE_OTHER): Payer: Medicare HMO | Admitting: Surgery

## 2018-12-11 ENCOUNTER — Encounter: Payer: Self-pay | Admitting: Surgery

## 2018-12-11 ENCOUNTER — Other Ambulatory Visit: Payer: Self-pay

## 2018-12-11 ENCOUNTER — Encounter: Payer: Self-pay | Admitting: *Deleted

## 2018-12-11 ENCOUNTER — Other Ambulatory Visit: Payer: Self-pay | Admitting: *Deleted

## 2018-12-11 DIAGNOSIS — I70223 Atherosclerosis of native arteries of extremities with rest pain, bilateral legs: Secondary | ICD-10-CM

## 2018-12-11 DIAGNOSIS — I70229 Atherosclerosis of native arteries of extremities with rest pain, unspecified extremity: Secondary | ICD-10-CM | POA: Insufficient documentation

## 2018-12-11 NOTE — Progress Notes (Signed)
[facility] [place]  Patient name: Lauren King MRN: [id number] DOB: 06-03-1950 Sex: female   REASON FOR VISIT:    Follow up  [place]:    Lauren King is a 68 y.o. female who was initially evaluated with angiography for claudication on 12/27/2017.  Because of the small caliber of her vessels, percutaneous intervention was not recommended.  Nonoperative management was initiated however her symptoms progressed to where she was experiencing rest pain almost every night.  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  No intervention was performed.  She is back today to discuss surgical revascularization.  She suffers from COPD.  She states that she has quit smoking.    She suffers from hypercholesterolemia which is managed with a statin.  She takes aspirin a single agent antiplatelet therapy.  She has obstructive sleep apnea and uses a CPAP machine. PAST MEDICAL HISTORY:   Past Medical History:  Diagnosis Date  . Anxiety   . Chronic lower back pain   . COPD (chronic obstructive pulmonary disease) ([place])   . Depression   . GERD (gastroesophageal reflux disease)   . Obstructive sleep apnea    CPAP     FAMILY HISTORY:   Family History  Problem Relation Age of Onset  . Lung cancer Sister   . Lung cancer Brother   . Colon cancer Neg Hx     SOCIAL HISTORY:   Social History   Tobacco Use  . Smoking status: Former Smoker    Packs/day: 0.40    Years: 40.00    Pack years: 16.00    Types: Cigarettes  . Smokeless tobacco: Never Used  . Tobacco comment: 5 a day  Substance Use Topics  . Alcohol use: No     ALLERGIES:   No Known Allergies   CURRENT MEDICATIONS:   Current Outpatient Medications  Medication Sig Dispense Refill  . albuterol (PROVENTIL) (2.5  MG/3ML) 0.083% nebulizer solution Take 2.5 mg by nebulization every 6 (six) hours as needed (wheezing/shortness of breath/cough).     [name] albuterol (VENTOLIN HFA) 108 (90 Base) MCG/ACT inhaler Inhale 2 puffs into the lungs every 6 (six) hours as needed (wheezing/shortness of breath).     [name] aspirin EC 81 MG tablet Take 81 mg by mouth daily.    . DUREZOL 0.05 % EMUL INSTILL ONE DROP IN THE RIGHT EYE THREE TIMES DAILY AS DIRECTED    . mirtazapine (REMERON) 30 MG tablet Take 1 tablet (30 mg total) by mouth at bedtime. 90 tablet 0  . rosuvastatin (CRESTOR) 5 MG tablet Take 1 tablet (5 mg total) by mouth daily. 30 tablet 3  . SYSTANE BALANCE 0.6 % SOLN Place 1 drop into the left eye 3 (three) times daily as needed (dry eyes.).    [name] traMADol (ULTRAM) 50 MG tablet Take 50-100 mg by mouth every 12 (twelve) hours as needed (pain.).     [name] TRELEGY ELLIPTA 100-62.5-25 MCG/INH AEPB Inhale 1 puff into the lungs daily.     No current facility-administered medications for this visit.     REVIEW OF SYSTEMS:   [X]  denotes positive finding, [ ]  denotes negative finding Cardiac  Comments:  Chest pain or chest pressure:    Shortness of breath upon exertion:    Short of breath when lying flat:    Irregular heart rhythm:  Vascular    Pain in calf, thigh, or hip brought on by ambulation: x   Pain in feet at night that wakes you up from your sleep:  x   Blood clot in your veins:    Leg swelling:         Pulmonary    Oxygen at home:    Productive cough:     Wheezing:         Neurologic    Sudden weakness in arms or legs:     Sudden numbness in arms or legs:     Sudden onset of difficulty speaking or slurred speech:    Temporary loss of vision in one eye:     Problems with dizziness:         Gastrointestinal    Blood in stool:     Vomited blood:         Genitourinary    Burning when urinating:     Blood in urine:        Psychiatric    Major depression:         Hematologic    Bleeding  problems:    Problems with blood clotting too easily:        Skin    Rashes or ulcers:        Constitutional    Fever or chills:      PHYSICAL EXAM:   Vitals:   12/11/18 0942 12/11/18 0946  BP: (!) 157/80 (!) 161/80  Pulse: 61   Resp: 18   Temp: 97.7 F (36.5 C)   SpO2: 98%   Weight: 38.6 kg   Height: 5\' 1"  (1.549 m)     GENERAL: The patient is a well-nourished female, in no acute distress. The vital signs are documented above. CARDIAC: There is a regular rate and rhythm.  VASCULAR: Nonpalpable pedal pulses PULMONARY: Non-labored respirations ABDOMEN: Soft and non-tender  MUSCULOSKELETAL: There are no major deformities or cyanosis. NEUROLOGIC: No focal weakness or paresthesias are detected. SKIN: There are no ulcers or rashes noted. PSYCHIATRIC: The patient has a normal affect.  STUDIES:   I have ordered and reviewed a carotid duplex which shows no significant stenosis.  Vein mapping:   +---------------+-----------+----------------------+---------------+-----------+   RT Diameter  RT Findings         GSV            LT Diameter  LT Findings      (cm)                                            (cm)                  +---------------+-----------+----------------------+---------------+-----------+      0.48                     Saphenofemoral         0.67                                                   Junction                                  +---------------+-----------+----------------------+---------------+-----------+  0.37                     Proximal thigh         0.36                  +---------------+-----------+----------------------+---------------+-----------+      0.31                       Mid thigh            0.36                  +---------------+-----------+----------------------+---------------+-----------+      0.22                      Distal thigh          0.32                   +---------------+-----------+----------------------+---------------+-----------+      0.27                          Knee              0.31                  +---------------+-----------+----------------------+---------------+-----------+      0.22                       Prox calf            0.24                  +---------------+-----------+----------------------+---------------+-----------+      0.22                        Mid calf            0.19                  +---------------+-----------+----------------------+---------------+-----------+     MEDICAL ISSUES:   Bilateral rest pain: The patient's left leg is more severe than the right.  We discussed proceeding with a left common femoral to tibioperoneal trunk bypass graft with saphenous vein.  Details of the operation were discussed with the patient.  We discussed the risks and benefits including but not limited to the risk of infection, premature graft failure, leg swelling, bleeding.  All of her questions were answered.  She will need cardiac clearance prior to her operation which has been scheduled.    Lauren King, IV, MD, FACS [facility] Tel [phone] Pager [phone]

## 2018-12-11 NOTE — H&P (View-Only) (Signed)
[facility] Specialist of [place]  Patient name: Lauren King MRN: [id number] DOB: 06-03-1950 Sex: female   REASON FOR VISIT:    Follow up  [place]:    Lauren King is a 68 y.o. female who was initially evaluated with angiography for claudication on 12/27/2017.  Because of the small caliber of her vessels, percutaneous intervention was not recommended.  Nonoperative management was initiated however her symptoms progressed to where she was experiencing rest pain almost every night.  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  No intervention was performed.  She is back today to discuss surgical revascularization.  She suffers from COPD.  She states that she has quit smoking.    She suffers from hypercholesterolemia which is managed with a statin.  She takes aspirin a single agent antiplatelet therapy.  She has obstructive sleep apnea and uses a CPAP machine. PAST MEDICAL HISTORY:   Past Medical History:  Diagnosis Date  . Anxiety   . Chronic lower back pain   . COPD (chronic obstructive pulmonary disease) ([place])   . Depression   . GERD (gastroesophageal reflux disease)   . Obstructive sleep apnea    CPAP     FAMILY HISTORY:   Family History  Problem Relation Age of Onset  . Lung cancer Sister   . Lung cancer Brother   . Colon cancer Neg Hx     SOCIAL HISTORY:   Social History   Tobacco Use  . Smoking status: Former Smoker    Packs/day: 0.40    Years: 40.00    Pack years: 16.00    Types: Cigarettes  . Smokeless tobacco: Never Used  . Tobacco comment: 5 a day  Substance Use Topics  . Alcohol use: No     ALLERGIES:   No Known Allergies   CURRENT MEDICATIONS:   Current Outpatient Medications  Medication Sig Dispense Refill  . albuterol (PROVENTIL) (2.5  MG/3ML) 0.083% nebulizer solution Take 2.5 mg by nebulization every 6 (six) hours as needed (wheezing/shortness of breath/cough).     [name] albuterol (VENTOLIN HFA) 108 (90 Base) MCG/ACT inhaler Inhale 2 puffs into the lungs every 6 (six) hours as needed (wheezing/shortness of breath).     [name] aspirin EC 81 MG tablet Take 81 mg by mouth daily.    . DUREZOL 0.05 % EMUL INSTILL ONE DROP IN THE RIGHT EYE THREE TIMES DAILY AS DIRECTED    . mirtazapine (REMERON) 30 MG tablet Take 1 tablet (30 mg total) by mouth at bedtime. 90 tablet 0  . rosuvastatin (CRESTOR) 5 MG tablet Take 1 tablet (5 mg total) by mouth daily. 30 tablet 3  . SYSTANE BALANCE 0.6 % SOLN Place 1 drop into the left eye 3 (three) times daily as needed (dry eyes.).    [name] traMADol (ULTRAM) 50 MG tablet Take 50-100 mg by mouth every 12 (twelve) hours as needed (pain.).     [name] TRELEGY ELLIPTA 100-62.5-25 MCG/INH AEPB Inhale 1 puff into the lungs daily.     No current facility-administered medications for this visit.     REVIEW OF SYSTEMS:   [X]  denotes positive finding, [ ]  denotes negative finding Cardiac  Comments:  Chest pain or chest pressure:    Shortness of breath upon exertion:    Short of breath when lying flat:    Irregular heart rhythm:  Vascular    Pain in calf, thigh, or hip brought on by ambulation: x   Pain in feet at night that wakes you up from your sleep:  x   Blood clot in your veins:    Leg swelling:         Pulmonary    Oxygen at home:    Productive cough:     Wheezing:         Neurologic    Sudden weakness in arms or legs:     Sudden numbness in arms or legs:     Sudden onset of difficulty speaking or slurred speech:    Temporary loss of vision in one eye:     Problems with dizziness:         Gastrointestinal    Blood in stool:     Vomited blood:         Genitourinary    Burning when urinating:     Blood in urine:        Psychiatric    Major depression:         Hematologic    Bleeding  problems:    Problems with blood clotting too easily:        Skin    Rashes or ulcers:        Constitutional    Fever or chills:      PHYSICAL EXAM:   Vitals:   12/11/18 0942 12/11/18 0946  BP: (!) 157/80 (!) 161/80  Pulse: 61   Resp: 18   Temp: 97.7 F (36.5 C)   SpO2: 98%   Weight: 38.6 kg   Height: 5' 1" (1.549 m)     GENERAL: The patient is a well-nourished female, in no acute distress. The vital signs are documented above. CARDIAC: There is a regular rate and rhythm.  VASCULAR: Nonpalpable pedal pulses PULMONARY: Non-labored respirations ABDOMEN: Soft and non-tender  MUSCULOSKELETAL: There are no major deformities or cyanosis. NEUROLOGIC: No focal weakness or paresthesias are detected. SKIN: There are no ulcers or rashes noted. PSYCHIATRIC: The patient has a normal affect.  STUDIES:   I have ordered and reviewed a carotid duplex which shows no significant stenosis.  Vein mapping:   +---------------+-----------+----------------------+---------------+-----------+   RT Diameter  RT Findings         GSV            LT Diameter  LT Findings      (cm)                                            (cm)                  +---------------+-----------+----------------------+---------------+-----------+      0.48                     Saphenofemoral         0.67                                                   Junction                                  +---------------+-----------+----------------------+---------------+-----------+        0.37                     Proximal thigh         0.36                  +---------------+-----------+----------------------+---------------+-----------+      0.31                       Mid thigh            0.36                  +---------------+-----------+----------------------+---------------+-----------+      0.22                      Distal thigh          0.32                   +---------------+-----------+----------------------+---------------+-----------+      0.27                          Knee              0.31                  +---------------+-----------+----------------------+---------------+-----------+      0.22                       Prox calf            0.24                  +---------------+-----------+----------------------+---------------+-----------+      0.22                        Mid calf            0.19                  +---------------+-----------+----------------------+---------------+-----------+     MEDICAL ISSUES:   Bilateral rest pain: The patient's left leg is more severe than the right.  We discussed proceeding with a left common femoral to tibioperoneal trunk bypass graft with saphenous vein.  Details of the operation were discussed with the patient.  We discussed the risks and benefits including but not limited to the risk of infection, premature graft failure, leg swelling, bleeding.  All of her questions were answered.  She will need cardiac clearance prior to her operation which has been scheduled.    Charlena CrossWells Ayden Hardwick, IV, MD, FACS [facility] Tel [phone] Pager [phone]

## 2018-12-11 NOTE — Progress Notes (Signed)
Call to patient with date and time change for surgery with Dr. Trula Slade. Instructed to be at [facility] admitting at 5:30 am on 12/22/2018. Verbalized understanding.

## 2018-12-12 ENCOUNTER — Encounter: Payer: Self-pay | Admitting: Cardiology

## 2018-12-12 ENCOUNTER — Ambulatory Visit (INDEPENDENT_AMBULATORY_CARE_PROVIDER_SITE_OTHER): Payer: Medicare HMO | Admitting: Cardiology

## 2018-12-12 ENCOUNTER — Encounter: Payer: Self-pay | Admitting: *Deleted

## 2018-12-12 ENCOUNTER — Telehealth: Payer: Self-pay | Admitting: Cardiology

## 2018-12-12 VITALS — BP 120/78 | HR 67 | Temp 97.8°F | Ht 61.0 in | Wt 83.0 lb

## 2018-12-12 DIAGNOSIS — R0602 Shortness of breath: Secondary | ICD-10-CM

## 2018-12-12 DIAGNOSIS — I739 Peripheral vascular disease, unspecified: Secondary | ICD-10-CM

## 2018-12-12 DIAGNOSIS — E782 Mixed hyperlipidemia: Secondary | ICD-10-CM

## 2018-12-12 DIAGNOSIS — I2510 Atherosclerotic heart disease of native coronary artery without angina pectoris: Secondary | ICD-10-CM

## 2018-12-12 DIAGNOSIS — Z01810 Encounter for preprocedural cardiovascular examination: Secondary | ICD-10-CM | POA: Diagnosis not present

## 2018-12-12 NOTE — Progress Notes (Signed)
Cardiology Office Note  Date: 12/12/2018   ID: Lauren SiCarolyn S Lacorte, DOB 01-Mar-1951, MRN [id number]  PCP:  Avon GullyFanta, Tesfaye, MD  Consulting Cardiologist:  Nona DellSamuel McDowell, MD Electrophysiologist:  None   Chief Complaint  Patient presents with  . Preoperative cardiac evaluation    History of Present Illness: Lauren King is a 68 y.o. female referred for cardiology consultation by Dr. Myra GianottiBrabham for preoperative cardiac assessment.  She is being considered for right femoral to popliteal artery bypass under general anesthesia.  She has been limited by leg pain bilaterally, experiences discomfort at nighttime and also more typical claudication symptoms.  She does not exercise but states that she stays as active as she can around her house.  Has a history of COPD and follows with Dr. Juanetta GoslingHawkins.  She states that her current inhaler regimen has been stable and she does not have a lot of difficulty with wheezing or use of her rescue inhalers.  She had a lung cancer screening chest CT back in April 2019 which incidentally mentioned vascular calcification including the coronary arteries.  I reviewed her medications which are outlined below and include aspirin along with Crestor.  She does have a family history of premature CAD.  RCRI risk calculator is class II, 0.9% chance of major adverse cardiac event based on available information.  I personally reviewed her ECG today which shows normal sinus rhythm.  Past Medical History:  Diagnosis Date  . Anxiety   . Chronic lower back pain   . COPD (chronic obstructive pulmonary disease) ([facility])   . Depression   . GERD (gastroesophageal reflux disease)   . Obstructive sleep apnea    CPAP    Past Surgical History:  Procedure Laterality Date  . ABDOMINAL HYSTERECTOMY    . COLONOSCOPY  01/20/2005   WUJ:WJXBJYNWGRMR:Ascending colon polyp/Diminutive polyps at 30 cm ablated/Normal rectum. hyperplastic polyp  . COLONOSCOPY N/A 03/25/2015   NFA:OZHYQMRMR:normal /left-sided  diverticula  . ESOPHAGOGASTRODUODENOSCOPY  11/30/2004   RMR:A small patch of salmon-colored epithelium (salmon-like lesion in the distal esophagus)/Antral pre-pyloric erosions of uncertain significance, otherwise, normal. solitary duodenal bulbar AVM.No Barrett's.  . ESOPHAGOGASTRODUODENOSCOPY N/A 03/25/2015   VHQ:IONGEXBRMR:erosive reflux/schatzkis ring s/p dilation/HH/short segment barrett  . ESOPHAGOGASTRODUODENOSCOPY (EGD) WITH ESOPHAGEAL DILATION N/A 09/22/2012   Rourk: erosive RE, biopsy negative for Barrett's. Gastritis.   . LOWER EXTREMITY ANGIOGRAPHY N/A 12/27/2017   Procedure: LOWER EXTREMITY ANGIOGRAPHY;  Surgeon: Nada LibmanBrabham, Vance W, MD;  Location: [facility] INVASIVE CV LAB;  Service: Cardiovascular;  Laterality: N/A;  . LOWER EXTREMITY ANGIOGRAPHY N/A 11/21/2018   Procedure: LOWER EXTREMITY ANGIOGRAPHY;  Surgeon: Nada LibmanBrabham, Vance W, MD;  Location: [facility] INVASIVE CV LAB;  Service: Cardiovascular;  Laterality: N/A;  . Elease HashimotoMALONEY DILATION N/A 03/25/2015   Procedure: Elease HashimotoMALONEY DILATION;  Surgeon: Corbin Adeobert M Rourk, MD;  Location: [facility] END[facility] SUITE;  Service: Endoscopy;  Laterality: N/A;  . SKIN CANCER DESTRUCTION    . TUBAL LIGATION     with incidental appendectomy    Current Outpatient Medications  Medication Sig Dispense Refill  . albuterol (PROVENTIL) (2.5 MG/3ML) 0.083% nebulizer solution Take 2.5 mg by nebulization every 6 (six) hours as needed (wheezing/shortness of breath/cough).     [name]. albuterol (VENTOLIN HFA) 108 (90 Base) MCG/ACT inhaler Inhale 2 puffs into the lungs every 6 (six) hours as needed (wheezing/shortness of breath).     [name]. aspirin EC 81 MG tablet Take 81 mg by mouth daily.    . DUREZOL 0.05 % EMUL INSTILL ONE DROP IN THE RIGHT EYE THREE  TIMES DAILY AS DIRECTED    . mirtazapine (REMERON) 30 MG tablet Take 1 tablet (30 mg total) by mouth at bedtime. 90 tablet 0  . rosuvastatin (CRESTOR) 5 MG tablet Take 1 tablet (5 mg total) by mouth daily. 30 tablet 3  . SYSTANE BALANCE 0.6 % SOLN Place 1 drop into the  left eye 3 (three) times daily as needed (dry eyes.).    [name] traMADol (ULTRAM) 50 MG tablet Take 50-100 mg by mouth every 12 (twelve) hours as needed (pain.).     [name] TRELEGY ELLIPTA 100-62.5-25 MCG/INH AEPB Inhale 1 puff into the lungs daily.     No current facility-administered medications for this visit.    Allergies:  Patient has no known allergies.   Social History: The patient  reports that she has quit smoking. Her smoking use included cigarettes. She has a 16.00 pack-year smoking history. She has never used smokeless tobacco. She reports that she does not drink alcohol or use drugs.   Family History: The patient's family history includes Lung cancer in her brother and sister.   ROS:  Please see the history of present illness. Otherwise, complete review of systems is positive for chronic dyspnea on exertion.  All other systems are reviewed and negative.   Physical Exam: VS:  BP 120/78   Pulse 67   Temp 97.8 F (36.6 C)   Ht 5\' 1"  (1.549 m)   Wt 83 lb (37.6 kg)   SpO2 98%   BMI 15.68 kg/m , BMI Body mass index is 15.68 kg/m.  Wt Readings from Last 3 Encounters:  12/12/18 83 lb (37.6 kg)  12/11/18 85 lb 3.2 oz (38.6 kg)  11/21/18 87 lb (39.5 kg)    General: Underweight appearing woman in no distress. HEENT: Conjunctiva and lids normal, wearing a mask. Neck: Supple, no elevated JVP or carotid bruits, no thyromegaly. Lungs: Diminished breath sounds but no wheezing or rhonchi, nonlabored breathing at rest. Cardiac: Regular rate and rhythm, no S3 or significant systolic murmur, no pericardial rub. Abdomen: Soft, nontender, bowel sounds present. Extremities: No pitting edema, distal pulses diminished. Skin: Warm and dry. Musculoskeletal: No kyphosis. Neuropsychiatric: Alert and oriented x3, affect grossly appropriate.  ECG:  An ECG dated 12/27/2017 was personally reviewed today and demonstrated:  Sinus bradycardia with nonspecific ST-T wave abnormalities and PVC.  Recent  Labwork: 11/21/2018: BUN 15; Creatinine, Ser 0.50; Hemoglobin 13.9; Potassium 4.0; Sodium 138   Other Studies Reviewed Today:  Carotid Dopplers 12/08/2018: 1 to 39% bilateral ICA stenoses.  Chest CT 08/10/2017: FINDINGS: Cardiovascular: Atherosclerotic calcification of the arterial vasculature, including coronary arteries. Heart size normal. No pericardial effusion.  Mediastinum/Nodes: Calcified mediastinal and hilar lymph nodes. No pathologically enlarged mediastinal or axillary lymph nodes. Hilar regions are otherwise difficult to definitively evaluate without IV contrast but appear grossly unremarkable. Esophagus is grossly unremarkable.  Lungs/Pleura: Biapical pleuroparenchymal scarring. Moderate centrilobular emphysema. Noncalcified pulmonary nodules measure 4.9 mm or less in size. No pleural fluid. Airway is unremarkable.  Upper Abdomen: Visualized portions of the liver, gallbladder, adrenal glands, kidneys, spleen, pancreas, stomach and bowel are grossly unremarkable.  Musculoskeletal: Degenerative changes in the spine. No worrisome lytic or sclerotic lesions.  IMPRESSION: 1. Lung-RADS 2, benign appearance or behavior. Continue annual screening with low-dose chest CT without contrast in 12 months. 2. Aortic atherosclerosis (ICD10-170.0). Coronary artery calcification. 3.  Emphysema (ICD10-J43.9).  Assessment and Plan:  1.  Preoperative cardiac evaluation in a 68 year old underweight appearing woman with COPD in the setting of previous  tobacco abuse, family history of premature CAD, and personal history of hyperlipidemia and peripheral arterial disease.  She had coronary artery calcifications by screening CT imaging of the chest back in April of last year.  She has not undergone any prior formal ischemic testing.  She is being considered for right femoral to popliteal artery bypass under general anesthesia by Dr. Myra GianottiBrabham.  RCRI risk stratification is relatively low at  baseline, but in light of difficulty quantifying true functional capacity we will proceed with additional evaluation [facility], mainly to exclude any high risk features.  This will be [facility] with further recommendations to follow.  2.  COPD with previous history of tobacco abuse.  She follows with Dr. Juanetta GoslingHawkins.  3.  Mixed hyperlipidemia on Crestor.  Medication Adjustments/Labs and Tests Ordered: Current medicines are reviewed at length with the patient today.  Concerns regarding medicines are outlined above.   Tests Ordered: Orders Placed This Encounter  Procedures  . NM Myocar Multi W/Spect W/Wall Motion / EF  . EKG 12-Lead    Medication Changes: No orders of the defined types were placed in this encounter.   Disposition:  Follow up test results and make further recommendations.  Signed, Jonelle SidleSamuel G. McDowell, MD, [facility] 12/12/2018 3:35 PM    [facility] [address], [place], [place] [address] Phone: [phone](336) [phone]; Fax: [phone]

## 2018-12-12 NOTE — Patient Instructions (Addendum)
Medication Instructions:   Your physician recommends that you continue on your current medications as directed. Please refer to the Current Medication list given to you today.  Labwork:  NONE  Testing/Procedures: Your physician has requested that you have a lexiscan myoview. For further information please visit [email or web address]. Please follow instruction sheet, as given.  Follow-Up:  Your physician recommends that you schedule a follow-up appointment in: pending test result.  Any Other Special Instructions Will Be Listed Below (If Applicable).  If you need a refill on your cardiac medications before your next appointment, please call your pharmacy. 

## 2018-12-12 NOTE — Telephone Encounter (Signed)
°  Precert needed for: Lexiscan Myoview on meds dx: pre-op evaluation; SOB; coronary artery disease by CT   Location: [facility]     Date: Dec 20, 2018

## 2018-12-18 ENCOUNTER — Ambulatory Visit (INDEPENDENT_AMBULATORY_CARE_PROVIDER_SITE_OTHER): Payer: Medicare HMO | Admitting: Licensed Clinical Social Worker

## 2018-12-18 ENCOUNTER — Encounter (HOSPITAL_COMMUNITY): Payer: Self-pay | Admitting: Licensed Clinical Social Worker

## 2018-12-18 ENCOUNTER — Other Ambulatory Visit: Payer: Self-pay

## 2018-12-18 DIAGNOSIS — F331 Major depressive disorder, recurrent, moderate: Secondary | ICD-10-CM

## 2018-12-18 NOTE — Progress Notes (Addendum)
PCP - Rosita Fire, MD Cardiologist - Rozann Lesches, MD  Chest x-ray -  Pt denies EKG - 12/12/2018 in EPIC  Stress Test - Scheduled for 12/20/2018 at [facility] - denies  Cardiac Cath - pt denies  Sleep Study - Yes CPAP - yes-  Fasting Blood Sugar - denies Checks Blood Sugar _____ times a day-denies  Blood Thinner Instructions: n/a Aspirin Instructions: Aspirin 81mg -continue per MD  Anesthesia review: yes-heart hx, pending Stress test tomorrow   Patient denies shortness of breath, fever, cough and chest pain at PAT appointment  Patient verbalized understanding of instructions that were given to them at the PAT appointment. Patient was also instructed that they will need to review over the PAT instructions again at home before surgery.   Coronavirus Screening  Have you experienced the following symptoms:  Cough yes/no: No Fever (>100.75F)  yes/no: No Runny nose yes/no: No Sore throat yes/no: No Difficulty breathing/shortness of breath  yes/no: No  Have you or a family member traveled in the last 14 days and where? yes/no: No   If the patient indicates "YES" to the above questions, their PAT will be rescheduled to limit the exposure to others and, the surgeon will be notified. THE PATIENT WILL NEED TO BE ASYMPTOMATIC FOR 14 DAYS.   If the patient is not experiencing any of these symptoms, the PAT nurse will instruct them to NOT bring anyone with them to their appointment since they may have these symptoms or traveled as well.   Please remind your patients and families that hospital visitation restrictions are in effect and the importance of the restrictions.

## 2018-12-18 NOTE — Pre-Procedure Instructions (Addendum)
Lauren King  12/18/2018      [facility] - [place], [place] - [address] [place] [address] Phone: [phone] Fax: [phone]  [facility] [place], [place] - [address] [place] [address] Phone: [phone] Fax: [phone]    Your procedure is scheduled on December 22, 2018  [facility] at 530 AM.  Call this number if you have problems the morning of surgery:  [phone]   Remember:  Do not eat or drink after midnight.     Take these medicines the morning of surgery with A SIP OF WATER  DUREZOL 0.05 % eye drops ketorolac (ACULAR) 0.5 % ophthalmic solution TRELEGY ELLIPTA 100-62.5-25 MCG/INH AEPB albuterol (PROVENTIL) (2.5 MG/3ML) 0.083% nebulizer solution- if needed albuterol (VENTOLIN HFA) 108 (90 Base) MCG/ACT inhaler-if needed  Follow your surgeon's instructions on when to hold/resume aspirin.  If no instructions were given call the office to determine how they would like to you take aspirin  7 days prior to surgery STOP taking any Aleve, Naproxen, Ibuprofen, Motrin, Advil, Goody's, BC's, all herbal medications, fish oil, and all vitamins   Day of surgery:  Do not wear jewelry, make-up or nail polish.  Do not wear lotions, powders, or perfumes, or deodorant.  Do not shave 48 hours prior to surgery.    Do not bring valuables to the hospital.  [facility] is not responsible for any belongings or valuables.  Contacts, dentures or bridgework may not be worn into surgery.  Leave your suitcase in the car.  After surgery it may be brought to your room.  For patients admitted to the hospital, discharge time will be determined by your treatment team.  Patients discharged the day of surgery will not be allowed to drive home.    [facility]- Preparing For Surgery  Before surgery, you can play an important role. Because skin is not sterile, your skin needs to be as free of  germs as possible. You can reduce the number of germs on your skin by washing with CHG (chlorahexidine gluconate) Soap before surgery.  CHG is an antiseptic cleaner which kills germs and bonds with the skin to continue killing germs even after washing.    Oral Hygiene is also important to reduce your risk of infection.  Remember - BRUSH YOUR TEETH THE MORNING OF SURGERY WITH YOUR REGULAR TOOTHPASTE  Please do not use if you have an allergy to CHG or antibacterial soaps. If your skin becomes reddened/irritated stop using the CHG.  Do not shave (including legs and underarms) for at least 48 hours prior to first CHG shower. It is OK to shave your face.  Please follow these instructions carefully.   1. Shower the NIGHT BEFORE SURGERY and the MORNING OF SURGERY with CHG.   2. If you chose to wash your hair, wash your hair first as usual with your normal shampoo.  3. After you shampoo, rinse your hair and body thoroughly to remove the shampoo.  4. Use CHG as you would any other liquid soap. You can apply CHG directly to the skin and wash gently with a scrungie or a clean washcloth.   5. Apply the CHG Soap to your body ONLY FROM THE NECK DOWN.  Do not use on open wounds or open sores. Avoid contact with your eyes, ears, mouth and genitals (private parts). Wash Face and genitals (private parts)  with your normal soap.  6. Wash thoroughly, paying special attention to the area where your surgery will be performed.  7. Thoroughly rinse your body with warm water from the neck down.  8. DO NOT shower/wash with your normal soap after using and rinsing off the CHG Soap.  9. Pat yourself dry with a CLEAN TOWEL.  10. Wear CLEAN PAJAMAS to bed the night before surgery, wear comfortable clothes the morning of surgery  11. Place CLEAN SHEETS on your bed the night of your first shower and DO NOT SLEEP WITH PETS.  Day of Surgery: Shower as above Do not apply any deodorants/lotions.  Please wear clean  clothes to the hospital/surgery center.   Remember to brush your teeth WITH YOUR REGULAR TOOTHPASTE.  Please read over the following fact sheets that you were given.

## 2018-12-18 NOTE — Progress Notes (Signed)
   Virtual Visit via Video Note  I connected with Lauren King on 12/18/18 at 11:00 AM EDT by a video enabled telemedicine application and verified that I am speaking with the correct person using two identifiers.  Location: Patient: Home Provider: Office   I discussed the limitations of evaluation and management by telemedicine and the availability of in person appointments. The patient expressed understanding and agreed to proceed.  Participation Level: Active  Behavioral Response: CasualAlertAnxious  Type of Therapy: Individual Therapy  Treatment Goals addressed: Coping  Interventions: CBT and Solution Focused  Summary: Lauren King is a 68 y.o. female who presents oriented x5 (person, place, situation, time, and object), casually dressed, appropriately groomed, average height, slender, and cooperative to address mood. Patient has a history of medical treatment including GERD, and diverticulosis. Patient has a history of mental health treatment including outpatient therapy, and medication management. Patient denies suicidal and homicidal ideations. Patient denies psychosis including auditory and visual hallucinations. Patient denies substance abuse. Patient is at low risk for lethality.  Physically: Patient is tired. She has a bipass surgery coming up this week. She is getting 3-4 hours of sleep. Patient's appetite fluctuates and she has lost weight.  Spiritually/values: No issues identified.  Relationships: Patient is getting along with her grandson and is going to spend time recovering at her daughter's home.  Emotionally/Mentally/Behavior:  Patient's mood has been ok. Patient is worried about her physical health but has accepted her health as well as her situation.   Patient engaged in session. Patient responded well to interventions. Patient continues to meet criteria for Major depressive disorder, recurrent episode, moderate. Patient will continue in outpatient therapy due  to being the least restrictive service to meet her needs. Patient made minimal progress on her goals.   Suicidal/Homicidal: Negativewithout intent/plan  Therapist Response: Therapist reviewed patient's recent thoughts and behaviors. Therapist utilized CBT to address mood. Therapist processed patient's feelings to identify triggers for mood. Therapist discussed with patient her physical health and mood.  Plan: Return again in 2-3 weeks.  Diagnosis: Axis I: Major depressive disorder, recurrent episode, moderate    Axis II: No diagnosis  I discussed the assessment and treatment plan with the patient. The patient was provided an opportunity to ask questions and all were answered. The patient agreed with the plan and demonstrated an understanding of the instructions.   The patient was advised to call back or seek an in-person evaluation if the symptoms worsen or if the condition fails to improve as anticipated.  I provided 30 minutes of non-face-to-face time during this encounter.   Glori Bickers, LCSW 12/18/2018

## 2018-12-19 ENCOUNTER — Other Ambulatory Visit (HOSPITAL_COMMUNITY): Payer: Medicare HMO

## 2018-12-19 ENCOUNTER — Other Ambulatory Visit (HOSPITAL_COMMUNITY)
Admission: RE | Admit: 2018-12-19 | Discharge: 2018-12-19 | Disposition: A | Discharge status: Home / Self Care | Payer: Medicare HMO | Source: Ambulatory Visit | Attending: Surgery | Admitting: Surgery

## 2018-12-19 ENCOUNTER — Encounter (HOSPITAL_COMMUNITY)
Admission: RE | Admit: 2018-12-19 | Discharge: 2018-12-19 | Disposition: A | Discharge status: Home / Self Care | Payer: Medicare HMO | Source: Ambulatory Visit | Attending: Surgery | Admitting: Surgery

## 2018-12-19 ENCOUNTER — Encounter (HOSPITAL_COMMUNITY): Payer: Self-pay

## 2018-12-19 ENCOUNTER — Other Ambulatory Visit: Payer: Self-pay

## 2018-12-19 DIAGNOSIS — Z01812 Encounter for preprocedural laboratory examination: Secondary | ICD-10-CM | POA: Diagnosis not present

## 2018-12-19 DIAGNOSIS — Z20828 Contact with and (suspected) exposure to other viral communicable diseases: Secondary | ICD-10-CM | POA: Insufficient documentation

## 2018-12-19 LAB — COMPREHENSIVE METABOLIC PANEL
ALT: 23 U/L (ref 0–44)
AST: 30 U/L (ref 15–41)
Albumin: 4.1 g/dL (ref 3.5–5.0)
Alkaline Phosphatase: 86 U/L (ref 38–126)
Anion gap: 11 (ref 5–15)
BUN: 9 mg/dL (ref 8–23)
CO2: 23 mmol/L (ref 22–32)
Calcium: 9.2 mg/dL (ref 8.9–10.3)
Chloride: 100 mmol/L (ref 98–111)
Creatinine, Ser: 0.71 mg/dL (ref 0.44–1.00)
GFR calc Af Amer: >60 mL/min (ref >60)
GFR calc non Af Amer: >60 mL/min (ref >60)
Glucose, Bld: 122 mg/dL — ABNORMAL HIGH (ref 70–99)
Potassium: 4 mmol/L (ref 3.5–5.1)
Sodium: 134 mmol/L — ABNORMAL LOW (ref 135–145)
Total Bilirubin: 0.3 mg/dL (ref 0.3–1.2)
Total Protein: 7.4 g/dL (ref 6.5–8.1)

## 2018-12-19 LAB — URINALYSIS, ROUTINE W REFLEX MICROSCOPIC
Bacteria, UA: NONE SEEN
Bilirubin Urine: NEGATIVE
Glucose, UA: NEGATIVE mg/dL
Ketones, ur: NEGATIVE mg/dL
Leukocytes,Ua: NEGATIVE
Nitrite: NEGATIVE
Protein, ur: NEGATIVE mg/dL
Specific Gravity, Urine: 1.004 — ABNORMAL LOW (ref 1.005–1.030)
pH: 7 (ref 5.0–8.0)

## 2018-12-19 LAB — SARS CORONAVIRUS 2 (TAT 6-24 HRS): SARS Coronavirus 2: NEGATIVE

## 2018-12-19 LAB — CBC
HCT: 47.2 % — ABNORMAL HIGH (ref 36.0–46.0)
Hemoglobin: 15.3 g/dL — ABNORMAL HIGH (ref 12.0–15.0)
MCH: 31.1 pg (ref 26.0–34.0)
MCHC: 32.4 g/dL (ref 30.0–36.0)
MCV: 95.9 fL (ref 80.0–100.0)
Platelets: 356 10*3/uL (ref 150–400)
RBC: 4.92 MIL/uL (ref 3.87–5.11)
RDW: 12.8 % (ref 11.5–15.5)
WBC: 12.2 10*3/uL — ABNORMAL HIGH (ref 4.0–10.5)
nRBC: 0 % (ref 0.0–0.2)

## 2018-12-19 LAB — SURGICAL PCR SCREEN
MRSA, PCR: NEGATIVE
Staphylococcus aureus: POSITIVE — AB

## 2018-12-19 LAB — TYPE AND SCREEN
ABO/RH(D): O NEG
Antibody Screen: NEGATIVE

## 2018-12-19 LAB — PROTIME-INR
INR: 1 (ref 0.8–1.2)
Prothrombin Time: 13.4 seconds (ref 11.4–15.2)

## 2018-12-19 LAB — ABO/RH: ABO/RH(D): O NEG

## 2018-12-19 LAB — APTT: aPTT: 26 seconds (ref 24–36)

## 2018-12-20 ENCOUNTER — Encounter (HOSPITAL_COMMUNITY)
Admission: RE | Admit: 2018-12-20 | Discharge: 2018-12-20 | Disposition: A | Discharge status: Home / Self Care | Payer: Medicare HMO | Source: Ambulatory Visit | Attending: Cardiology | Admitting: Cardiology

## 2018-12-20 ENCOUNTER — Encounter (HOSPITAL_BASED_OUTPATIENT_CLINIC_OR_DEPARTMENT_OTHER)
Admission: RE | Admit: 2018-12-20 | Discharge: 2018-12-20 | Disposition: A | Discharge status: Home / Self Care | Payer: Medicare HMO | Source: Ambulatory Visit | Attending: Cardiology | Admitting: Cardiology

## 2018-12-20 ENCOUNTER — Telehealth: Payer: Self-pay | Admitting: *Deleted

## 2018-12-20 DIAGNOSIS — G8929 Other chronic pain: Secondary | ICD-10-CM | POA: Diagnosis present

## 2018-12-20 DIAGNOSIS — G4733 Obstructive sleep apnea (adult) (pediatric): Secondary | ICD-10-CM | POA: Diagnosis present

## 2018-12-20 DIAGNOSIS — R0602 Shortness of breath: Secondary | ICD-10-CM | POA: Insufficient documentation

## 2018-12-20 DIAGNOSIS — Z7982 Long term (current) use of aspirin: Secondary | ICD-10-CM | POA: Diagnosis not present

## 2018-12-20 DIAGNOSIS — E039 Hypothyroidism, unspecified: Secondary | ICD-10-CM | POA: Diagnosis not present

## 2018-12-20 DIAGNOSIS — I70202 Unspecified atherosclerosis of native arteries of extremities, left leg: Secondary | ICD-10-CM | POA: Diagnosis present

## 2018-12-20 DIAGNOSIS — F419 Anxiety disorder, unspecified: Secondary | ICD-10-CM | POA: Diagnosis present

## 2018-12-20 DIAGNOSIS — K219 Gastro-esophageal reflux disease without esophagitis: Secondary | ICD-10-CM | POA: Diagnosis present

## 2018-12-20 DIAGNOSIS — Z01810 Encounter for preprocedural cardiovascular examination: Secondary | ICD-10-CM | POA: Diagnosis not present

## 2018-12-20 DIAGNOSIS — F418 Other specified anxiety disorders: Secondary | ICD-10-CM | POA: Diagnosis not present

## 2018-12-20 DIAGNOSIS — Z87891 Personal history of nicotine dependence: Secondary | ICD-10-CM | POA: Diagnosis not present

## 2018-12-20 DIAGNOSIS — D62 Acute posthemorrhagic anemia: Secondary | ICD-10-CM | POA: Diagnosis not present

## 2018-12-20 DIAGNOSIS — J449 Chronic obstructive pulmonary disease, unspecified: Secondary | ICD-10-CM | POA: Diagnosis present

## 2018-12-20 DIAGNOSIS — M545 Low back pain: Secondary | ICD-10-CM | POA: Diagnosis present

## 2018-12-20 DIAGNOSIS — E7800 Pure hypercholesterolemia, unspecified: Secondary | ICD-10-CM | POA: Diagnosis present

## 2018-12-20 DIAGNOSIS — F329 Major depressive disorder, single episode, unspecified: Secondary | ICD-10-CM | POA: Diagnosis present

## 2018-12-20 DIAGNOSIS — Z79899 Other long term (current) drug therapy: Secondary | ICD-10-CM | POA: Diagnosis not present

## 2018-12-20 DIAGNOSIS — I70222 Atherosclerosis of native arteries of extremities with rest pain, left leg: Secondary | ICD-10-CM | POA: Diagnosis not present

## 2018-12-20 DIAGNOSIS — I70209 Unspecified atherosclerosis of native arteries of extremities, unspecified extremity: Secondary | ICD-10-CM | POA: Diagnosis present

## 2018-12-20 LAB — NM MYOCAR MULTI W/SPECT W/WALL MOTION / EF
LV dias vol: 63 mL (ref 46–106)
LV sys vol: 23 mL
Peak HR: 100 {beats}/min
RATE: 0.37
Rest HR: 67 {beats}/min
SDS: 7
SRS: 1
SSS: 8
TID: 1.1

## 2018-12-20 MED ORDER — SODIUM CHLORIDE 0.9% FLUSH
INTRAVENOUS | Status: AC
  Administered 2018-12-20: 10 mL via INTRAVENOUS
  Filled 2018-12-20: qty 10

## 2018-12-20 MED ORDER — REGADENOSON 0.4 MG/5ML IV SOLN
INTRAVENOUS | Status: AC
  Administered 2018-12-20: 0.08 mg via INTRAVENOUS
  Filled 2018-12-20: qty 5

## 2018-12-20 MED ORDER — TECHNETIUM TC 99M TETROFOSMIN IV KIT
30.0000 | PACK | Freq: Once | INTRAVENOUS | Status: AC | PRN
  Administered 2018-12-20: 11:00:00 30.2 via INTRAVENOUS

## 2018-12-20 MED ORDER — TECHNETIUM TC 99M TETROFOSMIN IV KIT
10.0000 | PACK | Freq: Once | INTRAVENOUS | Status: AC | PRN
  Administered 2018-12-20: 10:00:00 9.3 via INTRAVENOUS

## 2018-12-20 NOTE — Telephone Encounter (Signed)
Patient informed. Copy sent to PCP and Brabham.

## 2018-12-20 NOTE — Telephone Encounter (Signed)
-----   Message from Erma Heritage, [place] sent at 12/20/2018  2:02 PM EDT ----- Covering for Dr. Domenic Polite - Please let the patient know her stress test showed no significant ischemia and pumping function of the heart was normal.  Did show a possible mild scar but no significant abnormalities. Was overall a low risk study. By review of Dr. Myles Gip note, she was cleared to proceed if no high-risk features. Cleared from a cardiac perspective for her upcoming surgery. Please forward a copy of results to Dr. Trula Slade.

## 2018-12-20 NOTE — Anesthesia Preprocedure Evaluation (Addendum)
Anesthesia Evaluation  Patient identified by MRN, date of birth, ID band Patient awake    Reviewed: Allergy & Precautions, NPO status , Patient's Chart, lab work & pertinent test results  Airway Mallampati: II  TM Distance: >3 FB Neck ROM: Full    Dental  (+) Dental Advisory Given, Edentulous Upper, Missing   Pulmonary sleep apnea , COPD, Current Smoker,    Pulmonary exam normal breath sounds clear to auscultation + decreased breath sounds      Cardiovascular negative cardio ROS Normal cardiovascular exam Rhythm:Regular Rate:Normal     Neuro/Psych PSYCHIATRIC DISORDERS Anxiety Depression negative neurological ROS     GI/Hepatic Neg liver ROS, hiatal hernia, PUD, GERD  ,  Endo/Other  negative endocrine ROS  Renal/GU negative Renal ROS     Musculoskeletal negative musculoskeletal ROS (+)   Abdominal   Peds  Hematology negative hematology ROS (+)   Anesthesia Other Findings Day of surgery medications reviewed with the patient.  Reproductive/Obstetrics                           Anesthesia Physical Anesthesia Plan  ASA: III  Anesthesia Plan: General   Post-op Pain Management:    Induction: Intravenous  PONV Risk Score and Plan: 3 and Ondansetron, Dexamethasone and Treatment may vary due to age or medical condition  Airway Management Planned: Oral ETT  Additional Equipment:   Intra-op Plan:   Post-operative Plan: Extubation in OR  Informed Consent: I have reviewed the patients History and Physical, chart, labs and discussed the procedure including the risks, benefits and alternatives for the proposed anesthesia with the patient or authorized representative who has indicated his/her understanding and acceptance.     Dental advisory given  Plan Discussed with: CRNA  Anesthesia Plan Comments: (Preop cardiology eval by Dr. Domenic Polite 12/12/18. Per note "Preoperative cardiac evaluation  in a 68 year old underweight appearing woman with COPD in the setting of previous tobacco abuse, family history of premature CAD, and personal history of hyperlipidemia and peripheral arterial disease.  She had coronary artery calcifications by screening CT imaging of the chest back in April of last year.  She has not undergone any prior formal ischemic testing.  She is being considered for right femoral to popliteal artery bypass under general anesthesia by Dr. Trula Slade.  RCRI risk stratification is relatively low at baseline, but in light of difficulty quantifying true functional capacity we will proceed with additional evaluation via [facility], mainly to exclude any high risk features."    Based on result pt was cleared by Bernerd Pho, PA-C per telephone encounter 12/20/18.  Nuclear stress 12/20/2018:  There was no ST segment deviation noted during stress.  Defect 1: There is a medium defect of moderate severity present in the mid inferoseptal location. I suspect this is due to soft tissue attenuation. However, a mild degree of scar cannot entirely be ruled out.  This is a low risk study. No significant ischemic zones.  Nuclear stress EF: 63%.  )     Anesthesia Quick Evaluation

## 2018-12-22 ENCOUNTER — Encounter (HOSPITAL_COMMUNITY)
Admission: RE | Disposition: A | Discharge status: Home Health | Payer: Self-pay | Source: Ambulatory Visit | Attending: Surgery

## 2018-12-22 ENCOUNTER — Other Ambulatory Visit: Payer: Self-pay

## 2018-12-22 ENCOUNTER — Encounter (HOSPITAL_COMMUNITY): Payer: Self-pay

## 2018-12-22 ENCOUNTER — Inpatient Hospital Stay (HOSPITAL_COMMUNITY): Payer: Medicare HMO | Admitting: Physician Assistant

## 2018-12-22 ENCOUNTER — Inpatient Hospital Stay (HOSPITAL_COMMUNITY)
Admission: RE | Admit: 2018-12-22 | Discharge: 2018-12-25 | DRG: 271 | Disposition: A | Discharge status: Home Health | Payer: Medicare HMO | Source: Ambulatory Visit | Attending: Surgery | Admitting: Surgery

## 2018-12-22 ENCOUNTER — Inpatient Hospital Stay (HOSPITAL_COMMUNITY): Payer: Medicare HMO | Admitting: Certified Registered Nurse Anesthetist

## 2018-12-22 DIAGNOSIS — K219 Gastro-esophageal reflux disease without esophagitis: Secondary | ICD-10-CM | POA: Diagnosis present

## 2018-12-22 DIAGNOSIS — E7800 Pure hypercholesterolemia, unspecified: Secondary | ICD-10-CM | POA: Diagnosis present

## 2018-12-22 DIAGNOSIS — I70202 Unspecified atherosclerosis of native arteries of extremities, left leg: Principal | ICD-10-CM | POA: Diagnosis present

## 2018-12-22 DIAGNOSIS — M545 Low back pain: Secondary | ICD-10-CM | POA: Diagnosis present

## 2018-12-22 DIAGNOSIS — Z79899 Other long term (current) drug therapy: Secondary | ICD-10-CM

## 2018-12-22 DIAGNOSIS — D62 Acute posthemorrhagic anemia: Secondary | ICD-10-CM | POA: Diagnosis not present

## 2018-12-22 DIAGNOSIS — F419 Anxiety disorder, unspecified: Secondary | ICD-10-CM | POA: Diagnosis present

## 2018-12-22 DIAGNOSIS — J449 Chronic obstructive pulmonary disease, unspecified: Secondary | ICD-10-CM | POA: Diagnosis present

## 2018-12-22 DIAGNOSIS — G4733 Obstructive sleep apnea (adult) (pediatric): Secondary | ICD-10-CM | POA: Diagnosis present

## 2018-12-22 DIAGNOSIS — I70209 Unspecified atherosclerosis of native arteries of extremities, unspecified extremity: Secondary | ICD-10-CM | POA: Diagnosis present

## 2018-12-22 DIAGNOSIS — G8929 Other chronic pain: Secondary | ICD-10-CM | POA: Diagnosis present

## 2018-12-22 DIAGNOSIS — Z7982 Long term (current) use of aspirin: Secondary | ICD-10-CM

## 2018-12-22 DIAGNOSIS — Z87891 Personal history of nicotine dependence: Secondary | ICD-10-CM

## 2018-12-22 DIAGNOSIS — I70222 Atherosclerosis of native arteries of extremities with rest pain, left leg: Secondary | ICD-10-CM | POA: Diagnosis not present

## 2018-12-22 DIAGNOSIS — F329 Major depressive disorder, single episode, unspecified: Secondary | ICD-10-CM | POA: Diagnosis present

## 2018-12-22 DIAGNOSIS — I739 Peripheral vascular disease, unspecified: Secondary | ICD-10-CM | POA: Diagnosis present

## 2018-12-22 HISTORY — PX: ENDARTERECTOMY FEMORAL: SHX5804

## 2018-12-22 HISTORY — PX: VEIN HARVEST: SHX6363

## 2018-12-22 HISTORY — PX: FEMORAL-TIBIAL BYPASS GRAFT: SHX938

## 2018-12-22 LAB — CBC
HCT: 35.3 % — ABNORMAL LOW (ref 36.0–46.0)
Hemoglobin: 11.4 g/dL — ABNORMAL LOW (ref 12.0–15.0)
MCH: 31.2 pg (ref 26.0–34.0)
MCHC: 32.3 g/dL (ref 30.0–36.0)
MCV: 96.7 fL (ref 80.0–100.0)
Platelets: 261 10*3/uL (ref 150–400)
RBC: 3.65 MIL/uL — ABNORMAL LOW (ref 3.87–5.11)
RDW: 13.1 % (ref 11.5–15.5)
WBC: 18.6 10*3/uL — ABNORMAL HIGH (ref 4.0–10.5)
nRBC: 0 % (ref 0.0–0.2)

## 2018-12-22 LAB — CREATININE, SERUM
Creatinine, Ser: 0.7 mg/dL (ref 0.44–1.00)
GFR calc Af Amer: >60 mL/min (ref >60)
GFR calc non Af Amer: >60 mL/min (ref >60)

## 2018-12-22 SURGERY — CREATION, BYPASS, ARTERIAL, FEMORAL TO TIBIAL, USING GRAFT
Anesthesia: General | Laterality: Left

## 2018-12-22 MED ORDER — CEFAZOLIN SODIUM-DEXTROSE 2-4 GM/100ML-% IV SOLN
2.0000 g | Freq: Three times a day (TID) | INTRAVENOUS | Status: AC
  Administered 2018-12-22 – 2018-12-23 (×2): 2 g via INTRAVENOUS
  Filled 2018-12-22 (×2): qty 100

## 2018-12-22 MED ORDER — POTASSIUM CHLORIDE CRYS ER 20 MEQ PO TBCR: 20.0000 meq | EXTENDED_RELEASE_TABLET | Freq: Every day | ORAL | Status: DC | PRN

## 2018-12-22 MED ORDER — PHENYLEPHRINE HCL (PRESSORS) 10 MG/ML IV SOLN
INTRAVENOUS | Status: AC
  Filled 2018-12-22: qty 1

## 2018-12-22 MED ORDER — PHENYLEPHRINE 40 MCG/ML (10ML) SYRINGE FOR IV PUSH (FOR BLOOD PRESSURE SUPPORT)
PREFILLED_SYRINGE | INTRAVENOUS | Status: AC
  Filled 2018-12-22: qty 10

## 2018-12-22 MED ORDER — HEMOSTATIC AGENTS (NO CHARGE) OPTIME
TOPICAL | Status: DC | PRN
  Administered 2018-12-22: 1 via TOPICAL

## 2018-12-22 MED ORDER — LABETALOL HCL 5 MG/ML IV SOLN: 10.0000 mg | INTRAVENOUS | Status: DC | PRN

## 2018-12-22 MED ORDER — HYDROMORPHONE HCL 1 MG/ML IJ SOLN
0.2500 mg | INTRAMUSCULAR | Status: DC | PRN
  Administered 2018-12-22 (×4): 0.5 mg via INTRAVENOUS

## 2018-12-22 MED ORDER — GUAIFENESIN-DM 100-10 MG/5ML PO SYRP: 15.0000 mL | ORAL_SOLUTION | ORAL | Status: DC | PRN

## 2018-12-22 MED ORDER — ONDANSETRON HCL 4 MG/2ML IJ SOLN
INTRAMUSCULAR | Status: DC | PRN
  Administered 2018-12-22: 4 mg via INTRAVENOUS

## 2018-12-22 MED ORDER — ASPIRIN EC 81 MG PO TBEC
81.0000 mg | DELAYED_RELEASE_TABLET | Freq: Every day | ORAL | Status: DC
  Administered 2018-12-23 – 2018-12-25 (×3): 81 mg via ORAL
  Filled 2018-12-22 (×3): qty 1

## 2018-12-22 MED ORDER — HYDROMORPHONE HCL 1 MG/ML IJ SOLN
INTRAMUSCULAR | Status: AC
  Filled 2018-12-22: qty 1

## 2018-12-22 MED ORDER — DEXAMETHASONE SODIUM PHOSPHATE 10 MG/ML IJ SOLN
INTRAMUSCULAR | Status: DC | PRN
  Administered 2018-12-22: 5 mg via INTRAVENOUS

## 2018-12-22 MED ORDER — HEPARIN SODIUM (PORCINE) 5000 UNIT/ML IJ SOLN
5000.0000 [IU] | Freq: Three times a day (TID) | INTRAMUSCULAR | Status: DC
  Administered 2018-12-23 – 2018-12-25 (×7): 5000 [IU] via SUBCUTANEOUS
  Filled 2018-12-22 (×7): qty 1

## 2018-12-22 MED ORDER — EPHEDRINE SULFATE 50 MG/ML IJ SOLN
INTRAMUSCULAR | Status: DC | PRN
  Administered 2018-12-22: 5 mg via INTRAVENOUS

## 2018-12-22 MED ORDER — DEXAMETHASONE SODIUM PHOSPHATE 10 MG/ML IJ SOLN
INTRAMUSCULAR | Status: AC
  Filled 2018-12-22: qty 1

## 2018-12-22 MED ORDER — FLUTICASONE-UMECLIDIN-VILANT 100-62.5-25 MCG/INH IN AEPB: 1.0000 | INHALATION_SPRAY | Freq: Every day | RESPIRATORY_TRACT | Status: DC

## 2018-12-22 MED ORDER — ALBUTEROL SULFATE HFA 108 (90 BASE) MCG/ACT IN AERS: 2.0000 | INHALATION_SPRAY | Freq: Four times a day (QID) | RESPIRATORY_TRACT | Status: DC | PRN

## 2018-12-22 MED ORDER — PHENYLEPHRINE HCL (PRESSORS) 10 MG/ML IV SOLN
INTRAVENOUS | Status: DC | PRN
  Administered 2018-12-22: 80 ug via INTRAVENOUS
  Administered 2018-12-22: 40 ug via INTRAVENOUS

## 2018-12-22 MED ORDER — LIDOCAINE 2% (20 MG/ML) 5 ML SYRINGE
INTRAMUSCULAR | Status: AC
  Filled 2018-12-22: qty 5

## 2018-12-22 MED ORDER — SODIUM CHLORIDE 0.9 % IV SOLN
INTRAVENOUS | Status: AC
  Filled 2018-12-22: qty 1.2

## 2018-12-22 MED ORDER — 0.9 % SODIUM CHLORIDE (POUR BTL) OPTIME
TOPICAL | Status: DC | PRN
  Administered 2018-12-22: 2000 mL

## 2018-12-22 MED ORDER — OXYCODONE HCL 5 MG PO TABS
5.0000 mg | ORAL_TABLET | ORAL | Status: DC | PRN
  Administered 2018-12-22 – 2018-12-25 (×12): 10 mg via ORAL
  Filled 2018-12-22 (×11): qty 2

## 2018-12-22 MED ORDER — SODIUM CHLORIDE 0.9 % IV SOLN: INTRAVENOUS | Status: DC

## 2018-12-22 MED ORDER — CEFAZOLIN SODIUM-DEXTROSE 2-4 GM/100ML-% IV SOLN
2.0000 g | INTRAVENOUS | Status: AC
  Administered 2018-12-22: 2 g via INTRAVENOUS

## 2018-12-22 MED ORDER — DIFLUPREDNATE 0.05 % OP EMUL: 1.0000 [drp] | Freq: Three times a day (TID) | OPHTHALMIC | Status: DC

## 2018-12-22 MED ORDER — ACETAMINOPHEN 325 MG PO TABS: 325.0000 mg | ORAL_TABLET | ORAL | Status: DC | PRN

## 2018-12-22 MED ORDER — ONDANSETRON HCL 4 MG/2ML IJ SOLN
4.0000 mg | Freq: Once | INTRAMUSCULAR | Status: AC | PRN
  Administered 2018-12-22: 14:00:00 4 mg via INTRAVENOUS

## 2018-12-22 MED ORDER — MIDAZOLAM HCL 2 MG/2ML IJ SOLN
INTRAMUSCULAR | Status: AC
  Filled 2018-12-22: qty 2

## 2018-12-22 MED ORDER — SODIUM CHLORIDE 0.9 % IV SOLN
INTRAVENOUS | Status: DC | PRN
  Administered 2018-12-22: 25 ug/min via INTRAVENOUS

## 2018-12-22 MED ORDER — ONDANSETRON HCL 4 MG/2ML IJ SOLN
INTRAMUSCULAR | Status: AC
  Filled 2018-12-22: qty 2

## 2018-12-22 MED ORDER — LACTATED RINGERS IV SOLN
INTRAVENOUS | Status: DC | PRN
  Administered 2018-12-22: 08:00:00 via INTRAVENOUS

## 2018-12-22 MED ORDER — PROPOFOL 10 MG/ML IV BOLUS
INTRAVENOUS | Status: AC
  Filled 2018-12-22: qty 20

## 2018-12-22 MED ORDER — PROPOFOL 10 MG/ML IV BOLUS
INTRAVENOUS | Status: DC | PRN
  Administered 2018-12-22: 90 mg via INTRAVENOUS

## 2018-12-22 MED ORDER — ROSUVASTATIN CALCIUM 5 MG PO TABS
5.0000 mg | ORAL_TABLET | Freq: Every day | ORAL | Status: DC
  Administered 2018-12-23 – 2018-12-25 (×3): 5 mg via ORAL
  Filled 2018-12-22 (×3): qty 1

## 2018-12-22 MED ORDER — ALBUTEROL SULFATE (2.5 MG/3ML) 0.083% IN NEBU: 2.5000 mg | INHALATION_SOLUTION | Freq: Four times a day (QID) | RESPIRATORY_TRACT | Status: DC | PRN

## 2018-12-22 MED ORDER — MEPERIDINE HCL 25 MG/ML IJ SOLN: 6.2500 mg | INTRAMUSCULAR | Status: DC | PRN

## 2018-12-22 MED ORDER — LACTATED RINGERS IV SOLN
INTRAVENOUS | Status: DC | PRN
  Administered 2018-12-22 (×2): via INTRAVENOUS

## 2018-12-22 MED ORDER — MAGNESIUM SULFATE 2 GM/50ML IV SOLN: 2.0000 g | Freq: Every day | INTRAVENOUS | Status: DC | PRN

## 2018-12-22 MED ORDER — MIRTAZAPINE 15 MG PO TABS
30.0000 mg | ORAL_TABLET | Freq: Every day | ORAL | Status: DC
  Administered 2018-12-22 – 2018-12-24 (×3): 30 mg via ORAL
  Filled 2018-12-22 (×3): qty 2

## 2018-12-22 MED ORDER — FLUTICASONE FUROATE-VILANTEROL 100-25 MCG/INH IN AEPB
1.0000 | INHALATION_SPRAY | Freq: Every day | RESPIRATORY_TRACT | Status: DC
  Administered 2018-12-23 – 2018-12-25 (×3): 1 via RESPIRATORY_TRACT
  Filled 2018-12-22: qty 28

## 2018-12-22 MED ORDER — ONDANSETRON HCL 4 MG/2ML IJ SOLN: 4.0000 mg | Freq: Four times a day (QID) | INTRAMUSCULAR | Status: DC | PRN

## 2018-12-22 MED ORDER — FENTANYL CITRATE (PF) 250 MCG/5ML IJ SOLN
INTRAMUSCULAR | Status: AC
  Filled 2018-12-22: qty 5

## 2018-12-22 MED ORDER — CEFAZOLIN SODIUM-DEXTROSE 2-4 GM/100ML-% IV SOLN
INTRAVENOUS | Status: AC
  Filled 2018-12-22: qty 100

## 2018-12-22 MED ORDER — SODIUM CHLORIDE 0.9 % IV SOLN
INTRAVENOUS | Status: DC | PRN
  Administered 2018-12-22: 500 mL

## 2018-12-22 MED ORDER — PANTOPRAZOLE SODIUM 40 MG PO TBEC
40.0000 mg | DELAYED_RELEASE_TABLET | Freq: Every day | ORAL | Status: DC
  Administered 2018-12-23 – 2018-12-25 (×3): 40 mg via ORAL
  Filled 2018-12-22 (×3): qty 1

## 2018-12-22 MED ORDER — DOCUSATE SODIUM 100 MG PO CAPS
100.0000 mg | ORAL_CAPSULE | Freq: Every day | ORAL | Status: DC
  Administered 2018-12-23 – 2018-12-25 (×3): 100 mg via ORAL
  Filled 2018-12-22 (×2): qty 1

## 2018-12-22 MED ORDER — PREDNISOLONE ACETATE 1 % OP SUSP
1.0000 [drp] | Freq: Three times a day (TID) | OPHTHALMIC | Status: DC
  Administered 2018-12-23 – 2018-12-25 (×6): 1 [drp] via OPHTHALMIC
  Filled 2018-12-22: qty 5

## 2018-12-22 MED ORDER — METOPROLOL TARTRATE 5 MG/5ML IV SOLN: 2.0000 mg | INTRAVENOUS | Status: DC | PRN

## 2018-12-22 MED ORDER — ROCURONIUM BROMIDE 10 MG/ML (PF) SYRINGE
PREFILLED_SYRINGE | INTRAVENOUS | Status: AC
  Filled 2018-12-22: qty 10

## 2018-12-22 MED ORDER — FENTANYL CITRATE (PF) 250 MCG/5ML IJ SOLN
INTRAMUSCULAR | Status: DC | PRN
  Administered 2018-12-22: 50 ug via INTRAVENOUS
  Administered 2018-12-22: 100 ug via INTRAVENOUS
  Administered 2018-12-22: 50 ug via INTRAVENOUS
  Administered 2018-12-22 (×2): 25 ug via INTRAVENOUS

## 2018-12-22 MED ORDER — ACETAMINOPHEN 325 MG RE SUPP: 325.0000 mg | RECTAL | Status: DC | PRN

## 2018-12-22 MED ORDER — CHLORHEXIDINE GLUCONATE 4 % EX LIQD: 60.0000 mL | Freq: Once | CUTANEOUS | Status: DC

## 2018-12-22 MED ORDER — ROCURONIUM BROMIDE 50 MG/5ML IV SOSY
PREFILLED_SYRINGE | INTRAVENOUS | Status: DC | PRN
  Administered 2018-12-22: 20 mg via INTRAVENOUS
  Administered 2018-12-22: 40 mg via INTRAVENOUS
  Administered 2018-12-22: 20 mg via INTRAVENOUS

## 2018-12-22 MED ORDER — SODIUM CHLORIDE 0.9 % IV SOLN: 500.0000 mL | Freq: Once | INTRAVENOUS | Status: DC | PRN

## 2018-12-22 MED ORDER — ALUM & MAG HYDROXIDE-SIMETH 200-200-20 MG/5ML PO SUSP: 15.0000 mL | ORAL | Status: DC | PRN

## 2018-12-22 MED ORDER — MIDAZOLAM HCL 2 MG/2ML IJ SOLN
INTRAMUSCULAR | Status: DC | PRN
  Administered 2018-12-22: 2 mg via INTRAVENOUS

## 2018-12-22 MED ORDER — LIDOCAINE 2% (20 MG/ML) 5 ML SYRINGE
INTRAMUSCULAR | Status: DC | PRN
  Administered 2018-12-22: 60 mg via INTRAVENOUS

## 2018-12-22 MED ORDER — HYDRALAZINE HCL 20 MG/ML IJ SOLN: 5.0000 mg | INTRAMUSCULAR | Status: DC | PRN

## 2018-12-22 MED ORDER — SUGAMMADEX SODIUM 200 MG/2ML IV SOLN
INTRAVENOUS | Status: DC | PRN
  Administered 2018-12-22: 76.6 mg via INTRAVENOUS

## 2018-12-22 MED ORDER — KETOROLAC TROMETHAMINE 0.5 % OP SOLN
1.0000 [drp] | Freq: Three times a day (TID) | OPHTHALMIC | Status: DC
  Administered 2018-12-22 – 2018-12-25 (×8): 1 [drp] via OPHTHALMIC
  Filled 2018-12-22: qty 5

## 2018-12-22 MED ORDER — MORPHINE SULFATE (PF) 2 MG/ML IV SOLN
2.0000 mg | INTRAVENOUS | Status: DC | PRN
  Administered 2018-12-23: 12:00:00 2 mg via INTRAVENOUS
  Administered 2018-12-23: 07:00:00 4 mg via INTRAVENOUS
  Filled 2018-12-22: qty 2
  Filled 2018-12-22: qty 1

## 2018-12-22 MED ORDER — OXYCODONE HCL 5 MG PO TABS
ORAL_TABLET | ORAL | Status: AC
  Filled 2018-12-22: qty 2

## 2018-12-22 MED ORDER — HEPARIN SODIUM (PORCINE) 1000 UNIT/ML IJ SOLN
INTRAMUSCULAR | Status: DC | PRN
  Administered 2018-12-22: 4000 [IU] via INTRAVENOUS
  Administered 2018-12-22 (×3): 1000 [IU] via INTRAVENOUS

## 2018-12-22 MED ORDER — PHENOL 1.4 % MT LIQD: 1.0000 | OROMUCOSAL | Status: DC | PRN

## 2018-12-22 MED ORDER — EPHEDRINE 5 MG/ML INJ
INTRAVENOUS | Status: AC
  Filled 2018-12-22: qty 10

## 2018-12-22 SURGICAL SUPPLY — 66 items
BANDAGE ESMARK 6X9 LF (GAUZE/BANDAGES/DRESSINGS) IMPLANT
BNDG ESMARK 6X9 LF (GAUZE/BANDAGES/DRESSINGS) ×3
CANISTER SUCT 3000ML PPV (MISCELLANEOUS) ×3 IMPLANT
CANNULA VESSEL 3MM 2 BLNT TIP (CANNULA) ×3 IMPLANT
CATH EMB 3FR 40CM (CATHETERS) ×2 IMPLANT
CATH EMB 4FR 40CM (CATHETERS) ×2 IMPLANT
CATH FOLEY 2WAY SLVR  5CC 14FR (CATHETERS) ×2
CATH FOLEY 2WAY SLVR 5CC 14FR (CATHETERS) IMPLANT
CLIP VESOCCLUDE MED 24/CT (CLIP) ×3 IMPLANT
CLIP VESOCCLUDE SM WIDE 24/CT (CLIP) ×3 IMPLANT
COVER PROBE W GEL 5X96 (DRAPES) ×2 IMPLANT
COVER SURGICAL LIGHT HANDLE (MISCELLANEOUS) ×2 IMPLANT
COVER WAND RF STERILE (DRAPES) ×3 IMPLANT
CUFF TOURN SGL QUICK 24 (TOURNIQUET CUFF) ×2
CUFF TRNQT CYL 24X4X16.5-23 (TOURNIQUET CUFF) IMPLANT
DERMABOND ADVANCED (GAUZE/BANDAGES/DRESSINGS) ×6
DERMABOND ADVANCED .7 DNX12 (GAUZE/BANDAGES/DRESSINGS) ×1 IMPLANT
DRAIN CHANNEL 15F RND FF W/TCR (WOUND CARE) IMPLANT
DRAPE X-RAY CASS 24X20 (DRAPES) IMPLANT
ELECT REM PT RETURN 9FT ADLT (ELECTROSURGICAL) ×3
ELECTRODE REM PT RTRN 9FT ADLT (ELECTROSURGICAL) ×1 IMPLANT
EVACUATOR SILICONE 100CC (DRAIN) IMPLANT
GLOVE BIO SURGEON STRL SZ 6.5 (GLOVE) ×1 IMPLANT
GLOVE BIO SURGEONS STRL SZ 6.5 (GLOVE) ×1
GLOVE BIOGEL PI IND STRL 6.5 (GLOVE) IMPLANT
GLOVE BIOGEL PI IND STRL 7.0 (GLOVE) IMPLANT
GLOVE BIOGEL PI IND STRL 7.5 (GLOVE) ×1 IMPLANT
GLOVE BIOGEL PI IND STRL 8 (GLOVE) IMPLANT
GLOVE BIOGEL PI INDICATOR 6.5 (GLOVE) ×6
GLOVE BIOGEL PI INDICATOR 7.0 (GLOVE) ×6
GLOVE BIOGEL PI INDICATOR 7.5 (GLOVE) ×2
GLOVE BIOGEL PI INDICATOR 8 (GLOVE) ×2
GLOVE ECLIPSE 6.5 STRL STRAW (GLOVE) ×2 IMPLANT
GLOVE SURG SS PI 7.5 STRL IVOR (GLOVE) ×3 IMPLANT
GOWN STRL REUS W/ TWL LRG LVL3 (GOWN DISPOSABLE) ×2 IMPLANT
GOWN STRL REUS W/ TWL XL LVL3 (GOWN DISPOSABLE) ×1 IMPLANT
GOWN STRL REUS W/TWL LRG LVL3 (GOWN DISPOSABLE) ×4
GOWN STRL REUS W/TWL XL LVL3 (GOWN DISPOSABLE) ×2
HEMOSTAT SNOW SURGICEL 2X4 (HEMOSTASIS) ×2 IMPLANT
KIT BASIN OR (CUSTOM PROCEDURE TRAY) ×3 IMPLANT
KIT TURNOVER KIT B (KITS) ×3 IMPLANT
NS IRRIG 1000ML POUR BTL (IV SOLUTION) ×6 IMPLANT
PACK PERIPHERAL VASCULAR (CUSTOM PROCEDURE TRAY) ×3 IMPLANT
PAD ARMBOARD 7.5X6 YLW CONV (MISCELLANEOUS) ×6 IMPLANT
SET COLLECT BLD 21X3/4 12 (NEEDLE) IMPLANT
SPONGE LAP 18X18 RF (DISPOSABLE) ×2 IMPLANT
STOPCOCK 4 WAY LG BORE MALE ST (IV SETS) ×2 IMPLANT
SUT ETHILON 3 0 PS 1 (SUTURE) IMPLANT
SUT PROLENE 5 0 C 1 24 (SUTURE) ×3 IMPLANT
SUT PROLENE 6 0 BV (SUTURE) ×21 IMPLANT
SUT PROLENE 7 0 BV1 MDA (SUTURE) ×2 IMPLANT
SUT SILK 2 0 SH (SUTURE) ×5 IMPLANT
SUT SILK 3 0 (SUTURE) ×6
SUT SILK 3-0 18XBRD TIE 12 (SUTURE) IMPLANT
SUT VIC AB 2-0 CT1 27 (SUTURE) ×4
SUT VIC AB 2-0 CT1 TAPERPNT 27 (SUTURE) ×2 IMPLANT
SUT VIC AB 3-0 SH 27 (SUTURE) ×10
SUT VIC AB 3-0 SH 27X BRD (SUTURE) ×2 IMPLANT
SUT VICRYL 4-0 PS2 18IN ABS (SUTURE) ×6 IMPLANT
SYR 3ML LL SCALE MARK (SYRINGE) ×2 IMPLANT
TAPE UMBILICAL COTTON 1/8X30 (MISCELLANEOUS) ×2 IMPLANT
TOWEL GREEN STERILE (TOWEL DISPOSABLE) ×3 IMPLANT
TRAY FOLEY MTR SLVR 16FR STAT (SET/KITS/TRAYS/PACK) ×3 IMPLANT
TUBING EXTENTION W/L.L. (IV SETS) IMPLANT
UNDERPAD 30X30 (UNDERPADS AND DIAPERS) ×3 IMPLANT
WATER STERILE IRR 1000ML POUR (IV SOLUTION) ×3 IMPLANT

## 2018-12-22 NOTE — Op Note (Signed)
Patient name: Lauren King MRN: [id number] DOB: Nov 08, 1950 Sex: female  12/22/2018 Pre-operative Diagnosis: Left leg rest pain Post-operative diagnosis:  Same Surgeon:  [name] Assistants:  Gaye Alken Procedure:   #1: Left common femoral to tibioperoneal trunk bypass graft with non-reversed ipsilateral translocated saphenous vein   #2: Left common femoral profundofemoral and external iliac endarterectomy   #3: Left popliteal endarterectomy Anesthesia: General Blood Loss: 150 cc Specimens: None  Findings: The patient had a posterior plaque in the common femoral artery which extended up into the external iliac artery.  Endarterectomy was performed.  The proximal anastomosis was approximately 2-1/2 cm long using the vein to function as a patch.  The vein was marginal however I elected to use it because of the anastomosis being to the tibial vessels.  It was barely long enough and length.  I performed a endarterectomy of the below-knee popliteal artery down into the tibioperoneal trunk and performed a end-to-side distal anastomosis.  Patient had a brisk Doppler signal in the posterior tibial artery after the bypass..  Indications: The patient was seen in 2019 with bilateral left greater than right claudication.  She underwent angiography and was felt to not be a good candidate for percutaneous revascularization because of her anatomy which included a distal below-knee popliteal artery occlusion.  Her arteries were felt to be too small.  No intervention was performed.  Over the past several months, the patient has developed rest pain in her left leg.  Repeat angiography was performed and decision was made to proceed with bypass for symptom management.  Procedure:  The patient was identified in the holding area and taken to [place] [address]  The patient was then placed supine on the table. general anesthesia was administered.  The patient was prepped and draped in the usual sterile fashion.   A time out was called and antibiotics were administered.  A longitudinal incision was made in the left groin.  Cautery was used about subcutaneous tissue.  The femoral sheath was opened sharply.  I exposed the common femoral artery from the inguinal ligament down to the bifurcation.  The profunda and superficial femoral artery were individually isolated as were side branches.  I then identified the saphenofemoral junction.  I initially traced out what turned out to be a medial branch which ended in the mid thigh.  I then traced out the main saphenous vein.  This was harvested through skip incisions.  Side branches were ligated between silk ties.  A medial longitudinal incision was made below the knee.  I continued to harvest the saphenous vein.  I dissected out the saphenous vein as far distally as I could go.  I ended up stopping because the caliber of the vein became too small for bypass.  I felt that I had just barely enough length on the saphenous vein to use the bypass to the tibioperoneal trunk.  Next, I reflected the gastrocnemius muscle posteriorly and expose the below-knee popliteal artery.  I proceeded distally and ligated the anterior tibial vein, exposing the anterior tibial artery as well as the tibioperoneal trunk and peroneal and posterior tibial artery.  I then created a tunnel between the 2 incisions.  I ligated the vein proximally and distally and prepared on the back table.  It distended to approximately 4 mm proximally and just below 3 mm distally.  This appeared to be a good size mismatch for a non-reversed bypass.  Next, the patient was fully heparinized.  After  the heparin circulated the femoral vessels were occluded.  A #11 blade was used to make an arteriotomy in the distal common femoral artery which was extended down to the origin of the profundofemoral artery and then proximally.  The length of the arteriotomy was approximately 2.5 cm.  There was a posterior plaque which compromised the  lumen and so I performed endarterectomy using a elevator.  A good distal endpoint in the profunda was obtained.  The plaque extended into the external iliac artery and so I inserted a #4 Fogarty balloon into the external iliac artery so that I could perform the endarterectomy up into the distal external iliac artery.  Once the plaque was removed, I checked the inflow.  There was excellent inflow.  The vein was then brought onto the table.  It was spatulated to fit the size of the arteriotomy and a running anastomosis was created with 6-0 Prolene.  Clamps were then released.  A valvulotome was used to lyse the valves.  3 passes of the valvulotome were performed and there was excellent pulsatile flow through the vein graft.  It was then marked for orientation.  The vein graft was then brought through the previously created tunnel.  The usable portion of the vein was realistically able to get down to the origin of the tibioperoneal trunk.  I placed a tourniquet on the thigh.  The leg was exsanguinated with an Esmarch and the tourniquet was taken to 250 mm of pressure.  Because of the length of the vein, I elected to open the distal below-knee popliteal artery down onto the tibioperoneal trunk.  I performed endarterectomy of the popliteal artery and tibioperoneal trunk.  Again the vein was just barely long enough for the anastomosis.  The vein was then spatulated to fit the size of the arteriotomy and a running anastomosis was created with 6-0 Prolene.  Prior to completion the tourniquet was let down and the appropriate flushing maneuvers were performed.  The anastomosis was then completed.  There was an excellent pulse within the vein graft.  The patient had a brisk Doppler signal at the ankle.  I did not reverse the heparin.  The vein harvest incisions were closed with 2 layers of Vicryl.  The below-knee incision was closed by reapproximating the fascia with 2-0 Vicryl to subcutaneous tissue with 2-0 Vicryl and the  skin with 3-0 Vicryl.  The groin was then irrigated.  Once hemostasis was satisfactory the femoral sheath was reapproximated with 2-0 Vicryl.  The subcutaneous tissue was then closed with multiple layers of Vicryl followed by skin closure with Vicryl.  Dermabond was placed on all incisions.  I rechecked the signal at the ankle which remained brisk.  The patient was then successfully extubated and taken to recovery in stable condition.  If the bypass graft occludes early, 2 options would exist.  The first would be to replace the vein graft with PTFE, however because the small size of her vessels, I would more than likely use the remaining portion of the medial saphenous branch to perform vein patch angioplasty of the popliteal artery, and then plugged the vein graft into the patch, resecting the distal piece of the existing vein bypass.   Disposition: To PACU stable   V. Durene CalWells Brabham, M.D., [facility] [place] Office: [phone] Pager:  [phone]

## 2018-12-22 NOTE — Interval H&P Note (Signed)
History and Physical Interval Note:  12/22/2018 7:23 AM  Lauren King  has presented today for surgery, with the diagnosis of PERIPHERAL VASCULAR DISEASE WITH REST PAIN.  The various methods of treatment have been discussed with the patient and family. After consideration of risks, benefits and other options for treatment, the patient has consented to  Procedure(s): BYPASS GRAFT FEMORAL-TIBIAL ARTERY LEFT LEG (Left) as a surgical intervention.  The patient's history has been reviewed, patient examined, no change in status, stable for surgery.  I have reviewed the patient's chart and labs.  Questions were answered to the patient's satisfaction.     [name]

## 2018-12-22 NOTE — Plan of Care (Signed)
Continue to monitor

## 2018-12-22 NOTE — Transfer of Care (Signed)
Immediate Anesthesia Transfer of Care Note  Patient: Lauren King  Procedure(s) Performed: BYPASS GRAFT FEMORAL-TIBIAL ARTERY LEFT LEG USING NONREVERSED LEFT GREAT SAPHENOUS VEIN (Left ) Vein Harvest Left Great Saphenous (Left ) Endarterectomy Left Femoral and Politeal Arteries (Left )  Patient Location: PACU  Anesthesia Type:General  Level of Consciousness: awake, alert , oriented and patient cooperative  Airway & Oxygen Therapy: Patient Spontanous Breathing  Post-op Assessment: Report given to RN and Post -op Vital signs reviewed and stable  Post vital signs: Reviewed and stable  Last Vitals:  Vitals Value Taken Time  BP 145/84 12/22/18 1312  Temp    Pulse 89 12/22/18 1314  Resp 14 12/22/18 1314  SpO2 92 % 12/22/18 1314  Vitals shown include unvalidated device data.  Last Pain:  Vitals:   12/22/18 0628  TempSrc:   PainSc: 0-No pain         Complications: No apparent anesthesia complications

## 2018-12-22 NOTE — Progress Notes (Signed)
Patient arrived to 4E room 12 at this time. Telemetry applied. CCMD notified. V/s and assessment complete. CHG bath done. Will continue to monitor.  Emelda Fear, RN

## 2018-12-22 NOTE — Anesthesia Procedure Notes (Signed)
Procedure Name: Intubation Date/Time: 12/22/2018 7:58 AM Performed by: Kathryne Hitch, CRNA Pre-anesthesia Checklist: Patient identified, Emergency Drugs available, Suction available and Patient being monitored Patient Re-evaluated:Patient Re-evaluated prior to induction Oxygen Delivery Method: Circle system utilized Preoxygenation: Pre-oxygenation with 100% oxygen Induction Type: IV induction Ventilation: Mask ventilation without difficulty Laryngoscope Size: Miller and 2 Grade View: Grade I Tube type: Oral Number of attempts: 1 Airway Equipment and Method: Stylet and Oral airway Placement Confirmation: ETT inserted through vocal cords under direct vision,  positive ETCO2 and breath sounds checked- equal and bilateral Secured at: 21 cm Tube secured with: Tape Dental Injury: Teeth and Oropharynx as per pre-operative assessment

## 2018-12-22 NOTE — Anesthesia Postprocedure Evaluation (Signed)
Anesthesia Post Note  Patient: Lauren King  Procedure(s) Performed: BYPASS GRAFT FEMORAL-TIBIAL ARTERY LEFT LEG USING NONREVERSED LEFT GREAT SAPHENOUS VEIN (Left ) Vein Harvest Left Great Saphenous (Left ) Endarterectomy Left Femoral and Politeal Arteries (Left )     Patient location during evaluation: PACU Anesthesia Type: General Level of consciousness: sedated and patient cooperative Pain management: pain level controlled Vital Signs Assessment: post-procedure vital signs reviewed and stable Respiratory status: spontaneous breathing Cardiovascular status: stable Anesthetic complications: no    Last Vitals:  Vitals:   12/22/18 1445 12/22/18 1500  BP: 130/78 119/72  Pulse: 84 77  Resp: 18 15  Temp:    SpO2:      Last Pain:  Vitals:   12/22/18 1530  TempSrc:   PainSc: [place]

## 2018-12-23 ENCOUNTER — Encounter (HOSPITAL_COMMUNITY): Payer: Self-pay | Admitting: Surgery

## 2018-12-23 LAB — CBC
HCT: 33.1 % — ABNORMAL LOW (ref 36.0–46.0)
Hemoglobin: 10.8 g/dL — ABNORMAL LOW (ref 12.0–15.0)
MCH: 31.8 pg (ref 26.0–34.0)
MCHC: 32.6 g/dL (ref 30.0–36.0)
MCV: 97.4 fL (ref 80.0–100.0)
Platelets: 255 10*3/uL (ref 150–400)
RBC: 3.4 MIL/uL — ABNORMAL LOW (ref 3.87–5.11)
RDW: 13.1 % (ref 11.5–15.5)
WBC: 9.6 10*3/uL (ref 4.0–10.5)
nRBC: 0 % (ref 0.0–0.2)

## 2018-12-23 LAB — BASIC METABOLIC PANEL
Anion gap: 8 (ref 5–15)
BUN: 7 mg/dL — ABNORMAL LOW (ref 8–23)
CO2: 24 mmol/L (ref 22–32)
Calcium: 8.1 mg/dL — ABNORMAL LOW (ref 8.9–10.3)
Chloride: 105 mmol/L (ref 98–111)
Creatinine, Ser: 0.74 mg/dL (ref 0.44–1.00)
GFR calc Af Amer: >60 mL/min (ref >60)
GFR calc non Af Amer: >60 mL/min (ref >60)
Glucose, Bld: 123 mg/dL — ABNORMAL HIGH (ref 70–99)
Potassium: 3.8 mmol/L (ref 3.5–5.1)
Sodium: 137 mmol/L (ref 135–145)

## 2018-12-23 NOTE — Evaluation (Signed)
Physical Therapy Evaluation Patient Details Name: Lauren King MRN: [id number] DOB: February 19, 1951 Today's Date: 12/23/2018   History of Present Illness  68 y.o. female s/p : Left common femoral to tibioperoneal trunk bypass graft with non-reversed ipsilateral translocated saphenous veinLeft common femoral profundofemoral and external iliac endarterectomy, and Left popliteal endarterectomy. Hx of anxiety, COPD, and depression.  Clinical Impression  Patient is s/p above surgery presenting with functional limitations due to the deficits listed below (see PT Problem List). Demonstrates slow but steady gate up to 125 feet today while using a rolling walker under supervision with minor cues. No physical assist required during therapy session. Pt will be staying with daughter and will have 24 hr supervision available. Patient will benefit from skilled PT to increase their independence and safety with mobility to allow discharge to the venue listed below.       Follow Up Recommendations Home health PT    Equipment Recommendations  3in1 (PT);Rolling walker with 5" wheels((new RW already in her room))    Recommendations for Other Services OT consult     Precautions / Restrictions Precautions Precautions: Fall Restrictions Weight Bearing Restrictions: No      Mobility  Bed Mobility Overal bed mobility: Modified Independent             General bed mobility comments: extra time  Transfers Overall transfer level: Needs assistance Equipment used: Rolling walker (2 wheeled) Transfers: Sit to/from Stand Sit to Stand: Supervision         General transfer comment: Supervision for safety, VC for hand placement, slow to rise, decreased WB through LLE.  Ambulation/Gait Ambulation/Gait assistance: Min guard Gait Distance (Feet): 125 Feet Assistive device: Rolling walker (2 wheeled) Gait Pattern/deviations: Step-to pattern;Step-through pattern;Decreased stance time - left;Decreased  stride length;Decreased dorsiflexion - left;Antalgic;Narrow base of support Gait velocity: decreased Gait velocity interpretation: <1.31 ft/sec, indicative of household ambulator General Gait Details: Educated on safe DME use with rolling walker. VC for symmetry of gait. Decreased stance time on LT but progressed towards step through gait pattern towards end of distance. Required a couple of standing rest breaks to complete distance.  Stairs            Wheelchair Mobility    Modified Rankin (Stroke Patients Only)       Balance Overall balance assessment: Mild deficits observed, not formally tested                                           Pertinent Vitals/Pain Pain Assessment: 0-10 Pain Score: 9  Pain Location: LLE Pain Descriptors / Indicators: Aching Pain Intervention(s): Limited activity within patient's tolerance;Monitored during session;Repositioned    Home Living Family/patient expects to be discharged to:: Private residence Living Arrangements: Children Available Help at Discharge: Family;Available 24 hours/day(Staying with daughter and grandkids) Type of Home: Mobile home Home Access: Stairs to enter Entrance Stairs-Rails: Left;Right;Can reach both Entrance Stairs-Number of Steps: 3 Home Layout: One level Home Equipment: None      Prior Function Level of Independence: Independent               Hand Dominance   Dominant Hand: Right    Extremity/Trunk Assessment   Upper Extremity Assessment Upper Extremity Assessment: Defer to OT evaluation    Lower Extremity Assessment Lower Extremity Assessment: LLE deficits/detail LLE Deficits / Details: gross weakness, muscle guarding, pain with full extension of Lt knee  Communication   Communication: No difficulties  Cognition Arousal/Alertness: Awake/alert Behavior During Therapy: WFL for tasks assessed/performed Overall Cognitive Status: Within Functional Limits for tasks  assessed                                        General Comments      Exercises General Exercises - Lower Extremity Ankle Circles/Pumps: AROM;Both;10 reps;Seated Gluteal Sets: Strengthening;Both;10 reps;Seated Long Arc Quad: Strengthening;Both;10 reps;Seated Hip Flexion/Marching: Strengthening;Both;10 reps;Seated   Assessment/Plan    PT Assessment Patient needs continued PT services  PT Problem List Decreased strength;Decreased range of motion;Decreased activity tolerance;Decreased balance;Decreased mobility;Decreased knowledge of use of DME;Decreased knowledge of precautions       PT Treatment Interventions DME instruction;Gait training;Stair training;Functional mobility training;Therapeutic activities;Therapeutic exercise;Balance training;Neuromuscular re-education;Patient/family education    PT Goals (Current goals can be found in the Care Plan section)  Acute Rehab PT Goals Patient Stated Goal: Go home PT Goal Formulation: With patient Time For Goal Achievement: 01/06/19 Potential to Achieve Goals: Good    Frequency Min 3X/week   Barriers to discharge        Co-evaluation               AM-PAC PT "6 Clicks" Mobility  Outcome Measure Help needed turning from your back to your side while in a flat bed without using bedrails?: None Help needed moving from lying on your back to sitting on the side of a flat bed without using bedrails?: None Help needed moving to and from a bed to a chair (including a wheelchair)?: None Help needed standing up from a chair using your arms (e.g., wheelchair or bedside chair)?: A Little Help needed to walk in hospital room?: A Little Help needed climbing 3-5 steps with a railing? : A Lot 6 Click Score: 20    End of Session   Activity Tolerance: Patient tolerated treatment well Patient left: in chair;with call bell/phone within reach   PT Visit Diagnosis: Difficulty in walking, not elsewhere classified  (R26.2);Other abnormalities of gait and mobility (R26.89)    Time: [id number] PT Time Calculation (min) (ACUTE ONLY): 23 min   Charges:   PT Evaluation $PT Eval Moderate Complexity: 1 Mod PT Treatments $Gait Training: 8-22 mins        [employer] April Carlyon, PT    [place] Mount[place] S Kinlee Garrison 12/23/2018, 11:50 AM

## 2018-12-23 NOTE — Progress Notes (Signed)
[facility] [place]  Subjective  - feel ok some soreness   Objective 127/60 89 98.3 F (36.8 C) (Oral) 16 92%  Intake/Output Summary (Last 24 hours) at 12/23/2018 1205 Last data filed at 12/23/2018 0957 Gross per 24 hour  Intake 1240 ml  Output 1850 ml  Net -610 ml   Foot warm Doppler signal PT  Assessment/Planning: Pod 1 fem tpt bypass oob bed possibly home Monday Acute blood loss anemia trend  Ruta Hinds 12/23/2018 12:05 PM --  Laboratory Lab Results: Recent Labs    12/22/18 1803 12/23/18 0332  WBC 18.6* 9.6  HGB 11.4* 10.8*  HCT 35.3* 33.1*  PLT 261 255   BMET Recent Labs    12/22/18 1803 12/23/18 0332  NA  --  137  K  --  3.8  CL  --  105  CO2  --  24  GLUCOSE  --  123*  BUN  --  7*  CREATININE 0.70 0.74  CALCIUM  --  8.1*    COAG Lab Results  Component Value Date   INR 1.0 12/19/2018   No results found for: PTT

## 2018-12-23 NOTE — Plan of Care (Signed)
Acute rehab goals established. 

## 2018-12-24 NOTE — Evaluation (Signed)
Occupational Therapy Evaluation Patient Details Name: Sampson SiCarolyn S Ramseyer MRN: [id number] DOB: 05-29-1950 Today's Date: 12/24/2018    History of Present Illness 68 y.o. female s/p : Left common femoral to tibioperoneal trunk bypass graft with non-reversed ipsilateral translocated saphenous veinLeft common femoral profundofemoral and external iliac endarterectomy, and Left popliteal endarterectomy. Hx of anxiety, COPD, and depression.   Clinical Impression   Pt s/p above. Pt independent with ADLs, PTA. Feel pt will benefit from acute OT to increase independence prior to d/c.     Follow Up Recommendations  No OT follow up;Supervision - Intermittent    Equipment Recommendations  3 in 1 bedside commode    Recommendations for Other Services       Precautions / Restrictions Precautions Precautions: Fall Restrictions Weight Bearing Restrictions: No      Mobility Bed Mobility Overal bed mobility: Modified Independent(supine to sit)                Transfers Overall transfer level: Needs assistance Equipment used: Rolling walker (2 wheeled) Transfers: Sit to/from Stand Sit to Stand: Min guard         General transfer comment: cues for hand placement    Balance        Cues for technique with RW.                                    ADL either performed or assessed with clinical judgement   ADL Overall ADL's : Needs assistance/impaired     Grooming: Set up;Sitting               Lower Body Dressing: Min guard;Sit to/from stand   Toilet Transfer: Min guard;RW;Ambulation(sit to stand from bed)           Functional mobility during ADLs: Min guard;Rolling walker General ADL Comments: Educated on safety such as safe footwear, items on floor, bag on walker, and educated on LB dressing technique.      Vision         Perception     Praxis      Pertinent Vitals/Pain Pain Assessment: 0-10 Pain Score: 8  Pain Location: left foot Pain  Descriptors / Indicators: Burning(stinging) Pain Intervention(s): Monitored during session;Repositioned     Hand Dominance Right   Extremity/Trunk Assessment Upper Extremity Assessment Upper Extremity Assessment: Generalized weakness(bilateral shoulder flexors)   Lower Extremity Assessment Lower Extremity Assessment: Defer to PT evaluation       Communication Communication Communication: No difficulties   Cognition Arousal/Alertness: Awake/alert Behavior During Therapy: WFL for tasks assessed/performed Overall Cognitive Status: Within Functional Limits for tasks assessed                                     General Comments       Exercises     Shoulder Instructions      Home Living Family/patient expects to be discharged to:: Private residence Living Arrangements: Children Available Help at Discharge: Family;Available 24 hours/day Type of Home: Mobile home Home Access: Stairs to enter Entrance Stairs-Number of Steps: 3 Entrance Stairs-Rails: Left;Right;Can reach both Home Layout: One level     Bathroom Shower/Tub: Walk-in shower;Tub/shower unit   Bathroom Toilet: Standard     Home Equipment: None          Prior Functioning/Environment Level of Independence: Independent  OT Problem List: Decreased strength;Impaired balance (sitting and/or standing);Decreased knowledge of use of DME or AE;Decreased knowledge of precautions;Pain      OT Treatment/Interventions: Self-care/ADL training;DME and/or AE instruction;Therapeutic activities;Patient/family education;Balance training    OT Goals(Current goals can be found in the care plan section) Acute Rehab OT Goals Patient Stated Goal: Go home OT Goal Formulation: With patient Time For Goal Achievement: 12/31/18 Potential to Achieve Goals: Good  OT Frequency: Min 2X/week   Barriers to D/C:            Co-evaluation              AM-PAC OT "6 Clicks" Daily Activity      Outcome Measure Help from another person eating meals?: None Help from another person taking care of personal grooming?: A Little Help from another person toileting, which includes using toliet, bedpan, or urinal?: A Little Help from another person bathing (including washing, rinsing, drying)?: A Little Help from another person to put on and taking off regular upper body clothing?: A Little Help from another person to put on and taking off regular lower body clothing?: A Little 6 Click Score: 19   End of Session Equipment Utilized During Treatment: Gait belt;Rolling walker;Other (comment)(Oxygen on at beginning of session-replaced at end) Nurse Communication: Other (comment)(talked with tech and she is ok with no chair alarm)  Activity Tolerance: Patient tolerated treatment well Patient left: with call bell/phone within reach;in chair  OT Visit Diagnosis: Pain Pain - Right/Left: Left Pain - part of body: (foot)                Time: 0258-5277 OT Time Calculation (min): 12 min Charges:  OT General Charges $OT Visit: 1 Visit OT Evaluation $OT Eval Moderate Complexity: 1 Mod   Zala Degrasse L Janet Decesare OTR/L 12/24/2018, 9:22 AM

## 2018-12-24 NOTE — Plan of Care (Signed)
Nursing will continue to monitor.  

## 2018-12-24 NOTE — Progress Notes (Signed)
[facility] [place]  Subjective  - feels ok   Objective (!) 128/56 82 98.1 F (36.7 C) (Oral) 17 91%  Intake/Output Summary (Last 24 hours) at 12/24/2018 1148 Last data filed at 12/24/2018 0948 Gross per 24 hour  Intake 920 ml  Output 2400 ml  Net -1480 ml   Leg incisions healing Foot warm Brisk doppler flow  Assessment/Planning: S/p bypass Plan for d/c tomorrow am  Ruta Hinds 12/24/2018 11:48 AM --  Laboratory Lab Results: Recent Labs    12/22/18 1803 12/23/18 0332  WBC 18.6* 9.6  HGB 11.4* 10.8*  HCT 35.3* 33.1*  PLT 261 255   BMET Recent Labs    12/22/18 1803 12/23/18 0332  NA  --  137  K  --  3.8  CL  --  105  CO2  --  24  GLUCOSE  --  123*  BUN  --  7*  CREATININE 0.70 0.74  CALCIUM  --  8.1*    COAG Lab Results  Component Value Date   INR 1.0 12/19/2018   No results found for: PTT

## 2018-12-25 ENCOUNTER — Encounter (HOSPITAL_COMMUNITY): Payer: Medicare HMO

## 2018-12-25 MED ORDER — OXYCODONE HCL 5 MG PO TABS: 5.0000 mg | ORAL_TABLET | Freq: Four times a day (QID) | ORAL | 0 refills | Status: DC | PRN

## 2018-12-25 NOTE — Discharge Instructions (Signed)
 [facility]   Discharge instructions  Lower Extremity Bypass Surgery  Please refer to the following instruction for your post-procedure care. Your surgeon or physician assistant will discuss any changes with you.  Activity  You are encouraged to walk as much as you can. You can slowly return to normal activities during the month after your surgery. Avoid strenuous activity and heavy lifting until your doctor tells you it's OK. Avoid activities such as vacuuming or swinging a golf club. Do not drive until your doctor give the OK and you are no longer taking prescription pain medications. It is also normal to have difficulty with sleep habits, eating and bowel movement after surgery. These will go away with time.  Bathing/Showering  You may shower after you go home. Do not soak in a bathtub, hot tub, or swim until the incision heals completely.  Incision Care  Clean your incision with mild soap and water. Shower every day. Pat the area dry with a clean towel. You do not need a bandage unless otherwise instructed. Do not apply any ointments or creams to your incision. If you have open wounds you will be instructed how to care for them or a visiting nurse may be arranged for you. If you have staples or sutures along your incision they will be removed at your post-op appointment. You may have skin glue on your incision. Do not peel it off. It will come off on its own in about one week. If you have a great deal of moisture in your groin, use a gauze help keep this area dry.  Diet  Resume your normal diet. There are no special food restrictions following this procedure. A low fat/ low cholesterol diet is recommended for all patients with vascular disease. In order to heal from your surgery, it is CRITICAL to get adequate nutrition. Your body requires vitamins, minerals, and protein. Vegetables are the best source of vitamins and minerals. Vegetables also provide the  perfect balance of protein. Processed food has little nutritional value, so try to avoid this.  Medications  Resume taking all your medications unless your doctor or nurse practitioner tells you not to. If your incision is causing pain, you may take over-the-counter pain relievers such as acetaminophen (Tylenol). If you were prescribed a stronger pain medication, please aware these medication can cause nausea and constipation. Prevent nausea by taking the medication with a snack or meal. Avoid constipation by drinking plenty of fluids and eating foods with high amount of fiber, such as fruits, vegetables, and grains. Take Colase 100 mg (an over-the-counter stool softener) twice a day as needed for constipation. Do not take Tylenol if you are taking prescription pain medications.  Follow Up  Our office will schedule a follow up appointment 2-3 weeks following discharge.  Please call us immediately for any of the following conditions  Severe or worsening pain in your legs or feet while at rest or while walking Increase pain, redness, warmth, or drainage (pus) from your incision site(s) Fever of 101 degree or higher The swelling in your leg with the bypass suddenly worsens and becomes more painful than when you were in the hospital If you have been instructed to feel your graft pulse then you should do so every day. If you can no longer feel this pulse, call the office immediately. Not all patients are given this instruction.  Leg swelling is common after leg bypass surgery.  The swelling should improve over a few months   following surgery. To improve the swelling, you may elevate your legs above the level of your heart while you are sitting or resting. Your surgeon or physician assistant may ask you to apply an ACE wrap or wear compression (TED) stockings to help to reduce swelling.  Reduce your risk of vascular disease  Stop smoking. If you would like help call [facility] at 1-800-QUIT-NOW  (1-800-784-8669) or [place] at 336-586-4000.  Manage your cholesterol Maintain a desired weight Control your diabetes weight Control your diabetes Keep your blood pressure down  If you have any questions, please call the office at [phone]   

## 2018-12-25 NOTE — Progress Notes (Addendum)
  Progress Note    12/25/2018 8:02 AM 3 Days Post-Op  Subjective:  Wants to go home   Vitals:   12/24/18 2305 12/25/18 0411  BP: (!) 110/91 112/69  Pulse: 82 86  Resp:    Temp: 98.4 F (36.9 C) 98 F (36.7 C)  SpO2: 95% 93%   Physical Exam: Lungs:  Non labored Incisions:  Incisions of LLE c/d/i Extremities:  L PT brisk by doppler Neurologic: A&O  CBC    Component Value Date/Time   WBC 9.6 12/23/2018 0332   RBC 3.40 (L) 12/23/2018 0332   HGB 10.8 (L) 12/23/2018 0332   HGB 12.3 08/28/2012   HCT 33.1 (L) 12/23/2018 0332   HCT 36 08/28/2012   PLT 255 12/23/2018 0332   MCV 97.4 12/23/2018 0332   MCV 92.4 08/28/2012   MCH 31.8 12/23/2018 0332   MCHC 32.6 12/23/2018 0332   RDW 13.1 12/23/2018 0332   LYMPHSABS 3.5 12/27/2009 0611   MONOABS 0.7 12/27/2009 0611   EOSABS 0.2 12/27/2009 0611   BASOSABS 0.1 12/27/2009 0611    BMET    Component Value Date/Time   NA 137 12/23/2018 0332   K 3.8 12/23/2018 0332   CL 105 12/23/2018 0332   CO2 24 12/23/2018 0332   GLUCOSE 123 (H) 12/23/2018 0332   BUN 7 (L) 12/23/2018 0332   CREATININE 0.74 12/23/2018 0332   CREATININE 0.62 08/28/2012   CALCIUM 8.1 (L) 12/23/2018 0332   CALCIUM 8.5 08/28/2012   GFRNONAA >60 12/23/2018 0332   GFRAA >60 12/23/2018 0332    INR    Component Value Date/Time   INR 1.0 12/19/2018 0847     Intake/Output Summary (Last 24 hours) at 12/25/2018 0802 Last data filed at 12/25/2018 0400 Gross per 24 hour  Intake 640 ml  Output -  Net 640 ml     Assessment/Plan:  68 y.o. female is s/p L CFA endart with femoral to TP trunk bypass 3 Days Post-Op   Perfusing LLE well Ok for discharge home today Follow up with Dr. Trula Slade in 2 weeks   Dagoberto Ligas, PA-C [facility] [phone] 12/25/2018 8:02 AM   I have independently interviewed and examined patient and agree with PA assessment and plan above.   Broc Caspers C. Donzetta Matters, MD [facility]  [place] Office: [phone] Pager: [phone]

## 2018-12-25 NOTE — Progress Notes (Signed)
Patient in a stable condition, discharge education reviewed with patient she verbalized understanding, iv removed, tele dc ccmd notified, verbal order received from [facility], [place] for PT and 3 in 1, walker for home use at bedside,prescription for [facility] given to patient, patient to be transported home by her daughter

## 2018-12-25 NOTE — Plan of Care (Signed)

## 2018-12-25 NOTE — Discharge Summary (Signed)
Physician Discharge Summary   Patient ID: Sampson SiCarolyn S Killam [id number] 68 y.o. 10/08/1950  Admit date: 12/22/2018  Discharge date and time: 12/25/18   Admitting Physician: Nada LibmanVance W Brabham, MD   Discharge Physician: Dr. Myra GianottiBrabham  Admission Diagnoses: PERIPHERAL VASCULAR DISEASE WITH REST PAIN  Discharge Diagnoses: same  Admission Condition: fair  Discharged Condition: fair  Indication for Admission: left leg rest pain  Hospital Course: Ms. Janith LimaCarolyn Kavanagh is a 68 year old female who was brought in as an outpatient on 12/22/2018 and underwent left femoral to TP trunk bypass with vein as well as left common femoral endarterectomy as well as left popliteal endarterectomy.  This performed by Dr. Myra GianottiBrabham on 12/22/2018.  The patient tolerated procedure well and was admitted to the hospital postoperatively.  Much of her hospital stay consisted of increasing mobility and controlling postoperative pain.  Physical therapy evaluation led to recommendation for home health PT which was arranged by the case manager.  At the time of discharge patient had a brisk left posterior tibial artery signal by Doppler.  She will follow-up in office in about 2 weeks to see Dr. Myra GianottiBrabham.  She will be prescribed 3 to 4 days of narcotic pain medication for continued postoperative pain control.  Discharge instructions were reviewed with the patient and she voiced her understanding.  She will be discharged home this morning with home health in stable condition.  Consults: None  Treatments: surgery: Left common femoral to tibioperoneal trunk bypass with saphenous vein, left common femoral profundofemoral and external iliac endarterectomy, and left popliteal endarterectomy by Dr. Myra GianottiBrabham on 12/22/2018  Discharge Exam: See progress note 12/25/2018 Vitals:   12/25/18 0827 12/25/18 0839  BP:  (!) 122/97  Pulse:  100  Resp:  18  Temp:  98.9 F (37.2 C)  SpO2: 93% 92%     Disposition: Discharge disposition: 01-Home or Self  Care       - [facility] ---  Post-op:  Wound infection: No  Graft infection: No  Transfusion: No  If yes,  units given New Arrhythmia: No Patency judged by: [x]  Dopper only, [ ]  Palpable graft pulse, [ ]  Palpable distal pulse, [ ]  ABI inc. > 0.15, [ ]  Duplex D/C Ambulatory Status: Ambulatory with Assistance  Complications: MI: [x ] No, [ ]  Troponin only, [ ]  EKG or Clinical CHF: No Resp failure: [x ] none, [ ]  Pneumonia, [ ]  Ventilator Chg in renal function: [x ] none, [ ]  Inc. Cr > 0.5, [ ]  Temp. Dialysis, [ ]  Permanent dialysis Stroke: [x ] None, [ ]  Minor, [ ]  Major Return to OR: No  Reason for return to OR: [ ]  Bleeding, [ ]  Infection, [ ]  Thrombosis, [ ]  Revision  Discharge medications: Statin use:  Yes ASA use:  Yes Plavix use:  No  for medical reason Not indicated Beta blocker use: No  for medical reason Not indicated Coumadin use: No  for medical reason Not indicated    Patient Instructions:  Allergies as of 12/25/2018   No Known Allergies     Medication List    TAKE these medications   albuterol (2.5 MG/3ML) 0.083% nebulizer solution Commonly known as: PROVENTIL Take 2.5 mg by nebulization every 6 (six) hours as needed for wheezing or shortness of breath.   albuterol 108 (90 Base) MCG/ACT inhaler Commonly known as: VENTOLIN HFA Inhale 2 puffs into the lungs every 6 (six) hours as needed for wheezing or shortness of breath.   aspirin EC 81 MG  tablet Take 81 mg by mouth daily.   Durezol 0.05 % Emul Generic drug: Difluprednate Place 1 drop into the right eye 3 (three) times daily.   ibuprofen 800 MG tablet Commonly known as: ADVIL Take 800 mg by mouth every 8 (eight) hours as needed for moderate pain.   ketorolac 0.5 % ophthalmic solution Commonly known as: ACULAR Place 1 drop into the right eye 3 (three) times daily.   mirtazapine 30 MG tablet Commonly known as: REMERON Take 1 tablet (30 mg total) by mouth at bedtime.   oxyCODONE 5  MG immediate release tablet Commonly known as: Oxy IR/ROXICODONE Take 1 tablet (5 mg total) by mouth every 6 (six) hours as needed for moderate pain.   OXYGEN Inhale 2 L into the lungs at bedtime.   rosuvastatin 5 MG tablet Commonly known as: Crestor Take 1 tablet (5 mg total) by mouth daily.   Trelegy Ellipta 100-62.5-25 MCG/INH Aepb Generic drug: Fluticasone-Umeclidin-Vilant Inhale 1 puff into the lungs daily.            Durable Medical Equipment  (From admission, onward)         Start     Ordered   12/25/18 0857  For home use only DME 3 n 1  Once     12/25/18 0856         Activity: activity as tolerated Diet: regular diet Wound Care: keep wound clean and dry  Follow-up with Dr. Trula Slade in 2 weeks.  SignedDagoberto Ligas 12/25/2018 9:28 AM

## 2018-12-25 NOTE — TOC Transition Note (Addendum)
Transition of Care [facility]) - CM/SW Discharge Note   Patient Details  Name: Lauren King MRN: [id number] Date of Birth: 1951-02-20  Transition of Care [facility]) CM/SW Contact:  Zenon Mayo, RN Phone Number: 12/25/2018, 9:20 AM   Clinical Narrative:    Patient for dc today, NCM offered choice for HHPT, she chose [facility], referral made to [facility].  Soc will begin 24-48 hrs post dc.  Also she has the rolling walker in her room.  RN will give her the order for the 3 n 1, her daughter is on her way to pick up patient and she states daughter said she does not have time to wait for the 3 n 1 to be  Brought up to her room , so RN will give her the order to take to [place] store to pick up 3 n 1 when she is able to .    [place] RN BSN - NCM received call from [place] with [facility], stating patient is going to [place] to daughter's home and they do not go to [place], so they can not take patient.  NCM contacted patient and she states to set her up with someone in [place],  [place] contacted [place], and they are able to accept patient, NCM faxed information to them.  Soc will begin 24-48 hrs post dc.    Final next level of care: [place] Barriers to Discharge: No Barriers Identified   Patient Goals and CMS Choice Patient states their goals for this hospitalization and ongoing recovery are:: to go home CMS Medicare.gov Compare Post Acute Care list provided to:: Patient Choice offered to / list presented to : Patient  Discharge Placement                       Discharge Plan and Services                DME Arranged: Walker rolling, 3-N-1 DME Agency: [employer] Date DME Agency Contacted: 12/25/18 Time DME Agency Contacted: [phone] Representative spoke with at DME Agency: zack HH Arranged: PT [place]: [place] ([place]) Date [place]: 12/25/18 Time [place]: 0920 Representative spoke with at [place]: [place] ([place]) Interventions     Readmission Risk Interventions No flowsheet data found.

## 2018-12-26 ENCOUNTER — Other Ambulatory Visit: Payer: Self-pay

## 2018-12-26 ENCOUNTER — Telehealth: Payer: Self-pay

## 2018-12-26 DIAGNOSIS — M549 Dorsalgia, unspecified: Secondary | ICD-10-CM | POA: Diagnosis not present

## 2018-12-26 DIAGNOSIS — J449 Chronic obstructive pulmonary disease, unspecified: Secondary | ICD-10-CM | POA: Diagnosis not present

## 2018-12-26 DIAGNOSIS — G4733 Obstructive sleep apnea (adult) (pediatric): Secondary | ICD-10-CM | POA: Diagnosis not present

## 2018-12-26 DIAGNOSIS — I739 Peripheral vascular disease, unspecified: Secondary | ICD-10-CM | POA: Diagnosis not present

## 2018-12-26 DIAGNOSIS — K219 Gastro-esophageal reflux disease without esophagitis: Secondary | ICD-10-CM | POA: Diagnosis not present

## 2018-12-26 DIAGNOSIS — F329 Major depressive disorder, single episode, unspecified: Secondary | ICD-10-CM | POA: Diagnosis not present

## 2018-12-26 NOTE — Patient Outreach (Addendum)
[place] [facility]) Care Management  12/26/2018  Lauren King October 09, 1950 [id number]     Transition of Care Referral  Referral Date: 12/26/2018 Referral Source: [employer] Discharge Report Date of Admission: 12/22/2018 Diagnosis: "PAD" Date of Discharge: 12/25/2018 Facility: [facility]    Outreach attempt # 1 to patient. Spoke with patient who denies any acute issues or concerns at present. She voices hat she is doing and feeling well. Patient currently in [place] staying with her daughter while she recovers from hospitalization. She confirms that she has all her meds in the home. She voices that she has MD follow up appts in place and her daughter will be assisting her to appts. Patient has not heard from [facility] agency yet but was just discharged yesterday. She is aware of 24-48hr time frame. She confirms that she has discharge paperwork and knows how to follow up if no one contacts her. Patient states that she has already contacted vascular MD office regarding her DME need. She did not wish to complete med review at this time. She denies any further RN CM/ THN  needs or concerns at this time.     Plan: RN CM will close case at this time.   Enzo Montgomery, RN,BSN,CCM [place] Management Telephonic Care Management Coordinator Direct Phone: [phone] Toll Free: [phone] Fax: [phone]

## 2018-12-27 DIAGNOSIS — G4733 Obstructive sleep apnea (adult) (pediatric): Secondary | ICD-10-CM | POA: Diagnosis not present

## 2018-12-27 DIAGNOSIS — J449 Chronic obstructive pulmonary disease, unspecified: Secondary | ICD-10-CM | POA: Diagnosis not present
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
## 2018-12-28 DIAGNOSIS — J449 Chronic obstructive pulmonary disease, unspecified: Secondary | ICD-10-CM | POA: Diagnosis not present

## 2018-12-28 DIAGNOSIS — E43 Unspecified severe protein-calorie malnutrition: Secondary | ICD-10-CM | POA: Diagnosis not present

## 2018-12-28 DIAGNOSIS — I739 Peripheral vascular disease, unspecified: Secondary | ICD-10-CM | POA: Diagnosis not present

## 2018-12-28 DIAGNOSIS — F17200 Nicotine dependence, unspecified, uncomplicated: Secondary | ICD-10-CM | POA: Diagnosis not present

## 2018-12-29 ENCOUNTER — Ambulatory Visit: Payer: Medicare HMO | Admitting: Cardiology

## 2018-12-29 DIAGNOSIS — M549 Dorsalgia, unspecified: Secondary | ICD-10-CM | POA: Diagnosis not present

## 2018-12-29 DIAGNOSIS — G4733 Obstructive sleep apnea (adult) (pediatric): Secondary | ICD-10-CM | POA: Diagnosis not present

## 2018-12-29 DIAGNOSIS — K219 Gastro-esophageal reflux disease without esophagitis: Secondary | ICD-10-CM | POA: Diagnosis not present

## 2018-12-29 DIAGNOSIS — J449 Chronic obstructive pulmonary disease, unspecified: Secondary | ICD-10-CM | POA: Diagnosis not present

## 2018-12-29 DIAGNOSIS — F329 Major depressive disorder, single episode, unspecified: Secondary | ICD-10-CM | POA: Diagnosis not present

## 2018-12-29 DIAGNOSIS — I739 Peripheral vascular disease, unspecified: Secondary | ICD-10-CM | POA: Diagnosis not present

## 2019-01-02 ENCOUNTER — Other Ambulatory Visit: Payer: Self-pay

## 2019-01-02 ENCOUNTER — Telehealth (HOSPITAL_COMMUNITY): Payer: Self-pay | Admitting: Psychiatry

## 2019-01-02 ENCOUNTER — Ambulatory Visit (HOSPITAL_COMMUNITY): Payer: Medicare HMO | Admitting: Psychiatry

## 2019-01-02 DIAGNOSIS — I739 Peripheral vascular disease, unspecified: Secondary | ICD-10-CM | POA: Diagnosis not present

## 2019-01-02 DIAGNOSIS — G4733 Obstructive sleep apnea (adult) (pediatric): Secondary | ICD-10-CM | POA: Diagnosis not present

## 2019-01-02 DIAGNOSIS — J449 Chronic obstructive pulmonary disease, unspecified: Secondary | ICD-10-CM | POA: Diagnosis not present

## 2019-01-02 DIAGNOSIS — K219 Gastro-esophageal reflux disease without esophagitis: Secondary | ICD-10-CM | POA: Diagnosis not present

## 2019-01-02 DIAGNOSIS — M549 Dorsalgia, unspecified: Secondary | ICD-10-CM | POA: Diagnosis not present

## 2019-01-02 DIAGNOSIS — F329 Major depressive disorder, single episode, unspecified: Secondary | ICD-10-CM | POA: Diagnosis not present

## 2019-01-02 NOTE — Telephone Encounter (Signed)
Contacted for her appointment this morning. She would like to reschedule the appointment as PT is here. Will reschedule to 10/2 at 9 AM for 20 mins, video

## 2019-01-05 DIAGNOSIS — J449 Chronic obstructive pulmonary disease, unspecified: Secondary | ICD-10-CM | POA: Diagnosis not present

## 2019-01-05 DIAGNOSIS — F1124 Opioid dependence with opioid-induced mood disorder: Secondary | ICD-10-CM | POA: Diagnosis not present

## 2019-01-05 DIAGNOSIS — M549 Dorsalgia, unspecified: Secondary | ICD-10-CM | POA: Diagnosis not present

## 2019-01-05 DIAGNOSIS — Z87891 Personal history of nicotine dependence: Secondary | ICD-10-CM | POA: Diagnosis not present

## 2019-01-05 DIAGNOSIS — I10 Essential (primary) hypertension: Secondary | ICD-10-CM | POA: Diagnosis not present

## 2019-01-05 DIAGNOSIS — F329 Major depressive disorder, single episode, unspecified: Secondary | ICD-10-CM | POA: Diagnosis not present

## 2019-01-05 DIAGNOSIS — I70203 Unspecified atherosclerosis of native arteries of extremities, bilateral legs: Secondary | ICD-10-CM | POA: Diagnosis not present

## 2019-01-05 DIAGNOSIS — J9610 Chronic respiratory failure, unspecified whether with hypoxia or hypercapnia: Secondary | ICD-10-CM | POA: Diagnosis not present

## 2019-01-05 DIAGNOSIS — G4733 Obstructive sleep apnea (adult) (pediatric): Secondary | ICD-10-CM | POA: Diagnosis not present

## 2019-01-05 DIAGNOSIS — E46 Unspecified protein-calorie malnutrition: Secondary | ICD-10-CM | POA: Diagnosis not present

## 2019-01-05 DIAGNOSIS — I739 Peripheral vascular disease, unspecified: Secondary | ICD-10-CM | POA: Diagnosis not present

## 2019-01-05 DIAGNOSIS — Z95820 Peripheral vascular angioplasty status with implants and grafts: Secondary | ICD-10-CM | POA: Diagnosis not present

## 2019-01-05 DIAGNOSIS — K219 Gastro-esophageal reflux disease without esophagitis: Secondary | ICD-10-CM | POA: Diagnosis not present

## 2019-01-05 DIAGNOSIS — Z681 Body mass index (BMI) 19 or less, adult: Secondary | ICD-10-CM | POA: Diagnosis not present

## 2019-01-07 DIAGNOSIS — J449 Chronic obstructive pulmonary disease, unspecified: Secondary | ICD-10-CM | POA: Diagnosis not present

## 2019-01-10 ENCOUNTER — Other Ambulatory Visit: Payer: Self-pay | Admitting: *Deleted

## 2019-01-10 DIAGNOSIS — Z122 Encounter for screening for malignant neoplasm of respiratory organs: Secondary | ICD-10-CM

## 2019-01-10 DIAGNOSIS — F17210 Nicotine dependence, cigarettes, uncomplicated: Secondary | ICD-10-CM

## 2019-01-10 DIAGNOSIS — Z87891 Personal history of nicotine dependence: Secondary | ICD-10-CM

## 2019-01-12 DIAGNOSIS — I739 Peripheral vascular disease, unspecified: Secondary | ICD-10-CM | POA: Diagnosis not present

## 2019-01-12 DIAGNOSIS — F329 Major depressive disorder, single episode, unspecified: Secondary | ICD-10-CM | POA: Diagnosis not present

## 2019-01-12 DIAGNOSIS — J449 Chronic obstructive pulmonary disease, unspecified: Secondary | ICD-10-CM | POA: Diagnosis not present

## 2019-01-12 DIAGNOSIS — K219 Gastro-esophageal reflux disease without esophagitis: Secondary | ICD-10-CM | POA: Diagnosis not present

## 2019-01-12 DIAGNOSIS — G4733 Obstructive sleep apnea (adult) (pediatric): Secondary | ICD-10-CM | POA: Diagnosis not present

## 2019-01-12 DIAGNOSIS — M549 Dorsalgia, unspecified: Secondary | ICD-10-CM | POA: Diagnosis not present

## 2019-01-16 ENCOUNTER — Other Ambulatory Visit: Payer: Self-pay | Admitting: Surgery

## 2019-01-16 DIAGNOSIS — I739 Peripheral vascular disease, unspecified: Secondary | ICD-10-CM

## 2019-01-16 DIAGNOSIS — I70223 Atherosclerosis of native arteries of extremities with rest pain, bilateral legs: Secondary | ICD-10-CM

## 2019-01-16 NOTE — Progress Notes (Signed)
Lauren King from [facility] in [place], [place] called saying she will be discharging this patient from [facility].    Patient is moving back to [place], [place] and will need to be referred to another [facility] in that area if she still needs physical therapy.  If so, we will contact Gail at [phone].  I contacted the patient and she feels like physical therapy has helped her.  We will reach out to other [facility] companies to try to accommodate the patient.  Thurston Hole., LPN

## 2019-01-17 ENCOUNTER — Ambulatory Visit (HOSPITAL_COMMUNITY): Payer: Medicare HMO | Admitting: Licensed Clinical Social Worker

## 2019-01-19 DIAGNOSIS — Z95820 Peripheral vascular angioplasty status with implants and grafts: Secondary | ICD-10-CM | POA: Diagnosis not present

## 2019-01-19 DIAGNOSIS — H269 Unspecified cataract: Secondary | ICD-10-CM | POA: Diagnosis not present

## 2019-01-19 DIAGNOSIS — J439 Emphysema, unspecified: Secondary | ICD-10-CM | POA: Diagnosis not present

## 2019-01-19 DIAGNOSIS — I70223 Atherosclerosis of native arteries of extremities with rest pain, bilateral legs: Secondary | ICD-10-CM | POA: Diagnosis not present

## 2019-01-19 DIAGNOSIS — Z7982 Long term (current) use of aspirin: Secondary | ICD-10-CM | POA: Diagnosis not present

## 2019-01-19 DIAGNOSIS — Z9981 Dependence on supplemental oxygen: Secondary | ICD-10-CM | POA: Diagnosis not present

## 2019-01-19 DIAGNOSIS — K5900 Constipation, unspecified: Secondary | ICD-10-CM | POA: Diagnosis not present

## 2019-01-19 DIAGNOSIS — I10 Essential (primary) hypertension: Secondary | ICD-10-CM | POA: Diagnosis not present

## 2019-01-19 DIAGNOSIS — Z48812 Encounter for surgical aftercare following surgery on the circulatory system: Secondary | ICD-10-CM | POA: Diagnosis not present

## 2019-01-22 ENCOUNTER — Ambulatory Visit (INDEPENDENT_AMBULATORY_CARE_PROVIDER_SITE_OTHER): Payer: Self-pay | Admitting: Surgery

## 2019-01-22 ENCOUNTER — Other Ambulatory Visit: Payer: Self-pay

## 2019-01-22 ENCOUNTER — Encounter: Payer: Self-pay | Admitting: Surgery

## 2019-01-22 VITALS — BP 126/79 | HR 72 | Temp 97.3°F | Resp 20 | Ht 61.0 in | Wt 81.9 lb

## 2019-01-22 DIAGNOSIS — I70223 Atherosclerosis of native arteries of extremities with rest pain, bilateral legs: Secondary | ICD-10-CM

## 2019-01-22 NOTE — Progress Notes (Signed)
Patient name: Lauren SiCarolyn S Thang MRN: [id number] DOB: March 10, 1951 Sex: female  REASON FOR VISIT:     post op  HISTORY OF PRESENT ILLNESS:   Lauren King is a 68 y.o. female who underwent left femoral to tibioperoneal trunk bypass graft with non-reversed ipsilateral translocated saphenous vein and concomitant left iliofemoral endarterectomy and popliteal endarterectomy.  The vein was marginal but it was used to to the tibial anastomosis.  Her bypass was done for rest pain  Her left leg feels much better.  The right leg is not bothering her   She suffers from COPD.  She states that she has quit smoking.   She suffers from hypercholesterolemia which is managed with a statin.  She takes aspirin a single agent antiplatelet therapy.  She has obstructive sleep apnea and uses a CPAP machine.  CURRENT MEDICATIONS:    Current Outpatient Medications  Medication Sig Dispense Refill  . albuterol (PROVENTIL) (2.5 MG/3ML) 0.083% nebulizer solution Take 2.5 mg by nebulization every 6 (six) hours as needed for wheezing or shortness of breath.     [name]. albuterol (VENTOLIN HFA) 108 (90 Base) MCG/ACT inhaler Inhale 2 puffs into the lungs every 6 (six) hours as needed for wheezing or shortness of breath.     [name]. aspirin EC 81 MG tablet Take 81 mg by mouth daily.    . DUREZOL 0.05 % EMUL Place 1 drop into the right eye 3 (three) times daily.     [name]. gatifloxacin (ZYMAXID) 0.5 % SOLN INSTILL ONE DROP IN LEFT EYE FOUR TIMES DAILY AS DIRECTED    . ibuprofen (ADVIL) 800 MG tablet Take 800 mg by mouth every 8 (eight) hours as needed for moderate pain.    [name]. ketorolac (ACULAR) 0.5 % ophthalmic solution Place 1 drop into the right eye 3 (three) times daily.    . mirtazapine (REMERON) 30 MG tablet Take 1 tablet (30 mg total) by mouth at bedtime. 90 tablet 0  . oxyCODONE (OXY IR/ROXICODONE) 5 MG immediate release tablet Take 1 tablet (5 mg total) by mouth every 6 (six) hours as needed for  moderate pain. 20 tablet 0  . OXYGEN Inhale 2 L into the lungs at bedtime.    . rosuvastatin (CRESTOR) 5 MG tablet Take 1 tablet (5 mg total) by mouth daily. 30 tablet 3  . traMADol (ULTRAM) 50 MG tablet Take 50 mg by mouth 2 (two) times daily.    . TRELEGY ELLIPTA 100-62.5-25 MCG/INH AEPB Inhale 1 puff into the lungs daily.     No current facility-administered medications for this visit.     REVIEW OF SYSTEMS:   [X]  denotes positive finding, [ ]  denotes negative finding Cardiac  Comments:  Chest pain or chest pressure:    Shortness of breath upon exertion:    Short of breath when lying flat:    Irregular heart rhythm:    Constitutional    Fever or chills:      PHYSICAL EXAM:   Vitals:   01/22/19 1129  BP: 126/79  Pulse: 72  Resp: 20  Temp: (!) 97.3 F (36.3 C)  SpO2: 95%  Weight: 81 lb 14.4 oz (37.1 kg)  Height: 5\' 1"  (1.549 m)    GENERAL: The patient is a well-nourished female, in no acute distress. The vital signs are documented above. CARDIOVASCULAR: There is a regular rate and rhythm. PULMONARY: Non-labored respirations Brisk posterior tibial Doppler signal on the left.  Monophasic on the right  STUDIES:   None  MEDICAL ISSUES:   Status post left femoral to tibioperoneal trunk bypass graft for rest pain.  The patient symptoms are much improved.  Her right leg is not bothering her as much currently.  She will continue with her current medications and therapy and I will have her follow-up in 3 months for repeat duplex.  I do not have a intentions of bypassing her right leg currently due to her lack of symptoms.  Leia Alf, MD, FACS [facility] [phone] Pager [phone]

## 2019-01-23 DIAGNOSIS — H269 Unspecified cataract: Secondary | ICD-10-CM | POA: Diagnosis not present

## 2019-01-23 DIAGNOSIS — I10 Essential (primary) hypertension: Secondary | ICD-10-CM | POA: Diagnosis not present

## 2019-01-23 DIAGNOSIS — K5900 Constipation, unspecified: Secondary | ICD-10-CM | POA: Diagnosis not present

## 2019-01-23 DIAGNOSIS — Z7982 Long term (current) use of aspirin: Secondary | ICD-10-CM | POA: Diagnosis not present

## 2019-01-23 DIAGNOSIS — Z95820 Peripheral vascular angioplasty status with implants and grafts: Secondary | ICD-10-CM | POA: Diagnosis not present

## 2019-01-23 DIAGNOSIS — J439 Emphysema, unspecified: Secondary | ICD-10-CM | POA: Diagnosis not present

## 2019-01-23 DIAGNOSIS — I70223 Atherosclerosis of native arteries of extremities with rest pain, bilateral legs: Secondary | ICD-10-CM | POA: Diagnosis not present

## 2019-01-23 DIAGNOSIS — Z48812 Encounter for surgical aftercare following surgery on the circulatory system: Secondary | ICD-10-CM | POA: Diagnosis not present

## 2019-01-23 DIAGNOSIS — Z9981 Dependence on supplemental oxygen: Secondary | ICD-10-CM | POA: Diagnosis not present

## 2019-01-28 DIAGNOSIS — F339 Major depressive disorder, recurrent, unspecified: Secondary | ICD-10-CM | POA: Diagnosis not present

## 2019-01-28 DIAGNOSIS — M549 Dorsalgia, unspecified: Secondary | ICD-10-CM | POA: Diagnosis not present

## 2019-01-31 DIAGNOSIS — H269 Unspecified cataract: Secondary | ICD-10-CM | POA: Diagnosis not present

## 2019-01-31 DIAGNOSIS — Z48812 Encounter for surgical aftercare following surgery on the circulatory system: Secondary | ICD-10-CM | POA: Diagnosis not present

## 2019-01-31 DIAGNOSIS — Z9981 Dependence on supplemental oxygen: Secondary | ICD-10-CM | POA: Diagnosis not present

## 2019-01-31 DIAGNOSIS — I10 Essential (primary) hypertension: Secondary | ICD-10-CM | POA: Diagnosis not present

## 2019-01-31 DIAGNOSIS — Z95820 Peripheral vascular angioplasty status with implants and grafts: Secondary | ICD-10-CM | POA: Diagnosis not present

## 2019-01-31 DIAGNOSIS — I70223 Atherosclerosis of native arteries of extremities with rest pain, bilateral legs: Secondary | ICD-10-CM | POA: Diagnosis not present

## 2019-01-31 DIAGNOSIS — K5900 Constipation, unspecified: Secondary | ICD-10-CM | POA: Diagnosis not present

## 2019-01-31 DIAGNOSIS — Z7982 Long term (current) use of aspirin: Secondary | ICD-10-CM | POA: Diagnosis not present

## 2019-01-31 DIAGNOSIS — J439 Emphysema, unspecified: Secondary | ICD-10-CM | POA: Diagnosis not present

## 2019-01-31 NOTE — Telephone Encounter (Signed)
No additional nursing notes. 
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
## 2019-02-02 DIAGNOSIS — J439 Emphysema, unspecified: Secondary | ICD-10-CM | POA: Diagnosis not present

## 2019-02-02 DIAGNOSIS — I70223 Atherosclerosis of native arteries of extremities with rest pain, bilateral legs: Secondary | ICD-10-CM | POA: Diagnosis not present

## 2019-02-02 DIAGNOSIS — K5900 Constipation, unspecified: Secondary | ICD-10-CM | POA: Diagnosis not present

## 2019-02-02 DIAGNOSIS — Z9981 Dependence on supplemental oxygen: Secondary | ICD-10-CM | POA: Diagnosis not present

## 2019-02-02 DIAGNOSIS — I10 Essential (primary) hypertension: Secondary | ICD-10-CM | POA: Diagnosis not present

## 2019-02-02 DIAGNOSIS — Z48812 Encounter for surgical aftercare following surgery on the circulatory system: Secondary | ICD-10-CM | POA: Diagnosis not present

## 2019-02-02 DIAGNOSIS — Z7982 Long term (current) use of aspirin: Secondary | ICD-10-CM | POA: Diagnosis not present

## 2019-02-02 DIAGNOSIS — H269 Unspecified cataract: Secondary | ICD-10-CM | POA: Diagnosis not present

## 2019-02-02 DIAGNOSIS — Z95820 Peripheral vascular angioplasty status with implants and grafts: Secondary | ICD-10-CM | POA: Diagnosis not present

## 2019-02-06 DIAGNOSIS — I70203 Unspecified atherosclerosis of native arteries of extremities, bilateral legs: Secondary | ICD-10-CM | POA: Diagnosis not present

## 2019-02-06 DIAGNOSIS — Z681 Body mass index (BMI) 19 or less, adult: Secondary | ICD-10-CM | POA: Diagnosis not present

## 2019-02-06 DIAGNOSIS — J9610 Chronic respiratory failure, unspecified whether with hypoxia or hypercapnia: Secondary | ICD-10-CM | POA: Diagnosis not present

## 2019-02-06 DIAGNOSIS — Z87891 Personal history of nicotine dependence: Secondary | ICD-10-CM | POA: Diagnosis not present

## 2019-02-06 DIAGNOSIS — Z95820 Peripheral vascular angioplasty status with implants and grafts: Secondary | ICD-10-CM | POA: Diagnosis not present

## 2019-02-06 DIAGNOSIS — J449 Chronic obstructive pulmonary disease, unspecified: Secondary | ICD-10-CM | POA: Diagnosis not present

## 2019-02-06 DIAGNOSIS — F11282 Opioid dependence with opioid-induced sleep disorder: Secondary | ICD-10-CM | POA: Diagnosis not present

## 2019-02-06 DIAGNOSIS — Z7951 Long term (current) use of inhaled steroids: Secondary | ICD-10-CM | POA: Diagnosis not present

## 2019-02-06 DIAGNOSIS — I10 Essential (primary) hypertension: Secondary | ICD-10-CM | POA: Diagnosis not present

## 2019-02-07 DIAGNOSIS — J449 Chronic obstructive pulmonary disease, unspecified: Secondary | ICD-10-CM | POA: Diagnosis not present

## 2019-02-09 ENCOUNTER — Ambulatory Visit (INDEPENDENT_AMBULATORY_CARE_PROVIDER_SITE_OTHER): Payer: Medicare HMO | Admitting: Psychiatry

## 2019-02-09 ENCOUNTER — Encounter (HOSPITAL_COMMUNITY): Payer: Self-pay | Admitting: Psychiatry

## 2019-02-09 ENCOUNTER — Other Ambulatory Visit: Payer: Self-pay

## 2019-02-09 DIAGNOSIS — F330 Major depressive disorder, recurrent, mild: Secondary | ICD-10-CM | POA: Diagnosis not present

## 2019-02-09 MED ORDER — MIRTAZAPINE 30 MG PO TABS: 30.0000 mg | ORAL_TABLET | Freq: Every day | ORAL | 1 refills | Status: DC

## 2019-02-09 NOTE — Patient Instructions (Signed)
1. Continue mirtazapine 30 mg at night  2. Next appointment:in 4 months

## 2019-02-27 DIAGNOSIS — J449 Chronic obstructive pulmonary disease, unspecified: Secondary | ICD-10-CM | POA: Diagnosis not present

## 2019-02-27 DIAGNOSIS — M549 Dorsalgia, unspecified: Secondary | ICD-10-CM | POA: Diagnosis not present

## 2019-03-08 ENCOUNTER — Other Ambulatory Visit: Payer: Self-pay | Admitting: Surgery

## 2019-03-09 DIAGNOSIS — J449 Chronic obstructive pulmonary disease, unspecified: Secondary | ICD-10-CM | POA: Diagnosis not present

## 2019-03-13 ENCOUNTER — Ambulatory Visit (HOSPITAL_COMMUNITY): Payer: Medicare HMO

## 2019-03-29 ENCOUNTER — Ambulatory Visit (HOSPITAL_COMMUNITY)
Admission: RE | Admit: 2019-03-29 | Discharge: 2019-03-29 | Disposition: A | Discharge status: Home / Self Care | Payer: Medicare HMO | Source: Ambulatory Visit | Attending: Acute Care | Admitting: Acute Care

## 2019-03-29 ENCOUNTER — Other Ambulatory Visit: Payer: Self-pay

## 2019-03-29 DIAGNOSIS — F17210 Nicotine dependence, cigarettes, uncomplicated: Secondary | ICD-10-CM

## 2019-03-29 DIAGNOSIS — Z122 Encounter for screening for malignant neoplasm of respiratory organs: Secondary | ICD-10-CM | POA: Diagnosis not present

## 2019-03-29 DIAGNOSIS — F17200 Nicotine dependence, unspecified, uncomplicated: Secondary | ICD-10-CM | POA: Diagnosis not present

## 2019-03-29 DIAGNOSIS — Z87891 Personal history of nicotine dependence: Secondary | ICD-10-CM | POA: Insufficient documentation

## 2019-03-30 DIAGNOSIS — J449 Chronic obstructive pulmonary disease, unspecified: Secondary | ICD-10-CM | POA: Diagnosis not present

## 2019-03-30 DIAGNOSIS — M549 Dorsalgia, unspecified: Secondary | ICD-10-CM | POA: Diagnosis not present

## 2019-04-09 DIAGNOSIS — J449 Chronic obstructive pulmonary disease, unspecified: Secondary | ICD-10-CM | POA: Diagnosis not present

## 2019-04-11 ENCOUNTER — Other Ambulatory Visit: Payer: Self-pay | Admitting: *Deleted

## 2019-04-11 DIAGNOSIS — Z122 Encounter for screening for malignant neoplasm of respiratory organs: Secondary | ICD-10-CM

## 2019-04-11 DIAGNOSIS — Z87891 Personal history of nicotine dependence: Secondary | ICD-10-CM

## 2019-04-11 DIAGNOSIS — F17210 Nicotine dependence, cigarettes, uncomplicated: Secondary | ICD-10-CM

## 2019-04-13 ENCOUNTER — Other Ambulatory Visit: Payer: Self-pay

## 2019-04-13 DIAGNOSIS — I70223 Atherosclerosis of native arteries of extremities with rest pain, bilateral legs: Secondary | ICD-10-CM

## 2019-04-13 DIAGNOSIS — I739 Peripheral vascular disease, unspecified: Secondary | ICD-10-CM

## 2019-04-16 ENCOUNTER — Other Ambulatory Visit: Payer: Self-pay

## 2019-04-16 ENCOUNTER — Ambulatory Visit (HOSPITAL_COMMUNITY)
Admission: RE | Admit: 2019-04-16 | Discharge: 2019-04-16 | Disposition: A | Discharge status: Home / Self Care | Payer: Medicare HMO | Source: Ambulatory Visit | Attending: Surgery | Admitting: Surgery

## 2019-04-16 ENCOUNTER — Telehealth: Payer: Self-pay | Admitting: *Deleted

## 2019-04-16 ENCOUNTER — Ambulatory Visit (INDEPENDENT_AMBULATORY_CARE_PROVIDER_SITE_OTHER): Payer: Medicare HMO | Admitting: Surgery

## 2019-04-16 ENCOUNTER — Encounter: Payer: Self-pay | Admitting: Surgery

## 2019-04-16 ENCOUNTER — Ambulatory Visit (INDEPENDENT_AMBULATORY_CARE_PROVIDER_SITE_OTHER)
Admission: RE | Admit: 2019-04-16 | Discharge: 2019-04-16 | Disposition: A | Discharge status: Home / Self Care | Payer: Medicare HMO | Source: Ambulatory Visit | Attending: Surgery | Admitting: Surgery

## 2019-04-16 DIAGNOSIS — I70223 Atherosclerosis of native arteries of extremities with rest pain, bilateral legs: Secondary | ICD-10-CM | POA: Insufficient documentation

## 2019-04-16 DIAGNOSIS — I739 Peripheral vascular disease, unspecified: Secondary | ICD-10-CM | POA: Insufficient documentation

## 2019-04-16 NOTE — Telephone Encounter (Signed)
Virtual Visit Pre-Appointment Phone Call  Today, I spoke with Lauren King and performed the following actions:  1. I explained that we are currently trying to limit exposure to the COVID-19 virus by seeing patients at home rather than in the office.  I explained that the visits are best done by video, but can be done by telephone.  I asked the patient if a virtual visit that the patient would like to try instead of coming into the office. Lauren King agreed to proceed with the virtual visit scheduled with Lauren King on 04/16/2019.    2. I confirmed the BEST phone number to call the day of the visit and- I included this in appointment notes.  3. I asked if the patient had access to (through a family member/friend) a smartphone with video capability to be used for her visit?"  The patient said yes -        4. I confirmed consent by  a. sending through MyChart or by email the FULL LENGTH CONSENT FOR TELE-HEALTH VISIT as written at the end of this message or  b. verbally as listed below. i. This visit is being performed in the setting of COVID-19. ii. All virtual visits are billed to your insurance company just like a normal visit would be.   iii. We'd like you to understand that the technology does not allow for your provider to perform an examination, and thus may limit your provider's ability to fully assess your condition.  iv. If your provider identifies any concerns that need to be evaluated in person, we will make arrangements to do so.   v. Finally, though the technology is pretty good, we cannot assure that it will always work on either your or our end, and in the setting of a video visit, we may have to convert it to a phone-only visit.  In either situation, we cannot ensure that we have a secure connection.   vi. Are you willing to proceed?"  STAFF: Did the patient verbally acknowledge consent to telehealth visit? Document YES/NO here: YES   2. I advised the patient to  be prepared - I asked that the patient, on the day of her visit, record any information possible with the equipment at her home, such as blood pressure, pulse, oxygen saturation, and your weight and write them all down. I asked the patient to have a pen and paper handy nearby the day of the visit as well.  3. If the patient was scheduled for a video visit, I informed the patient that the visit with the doctor would start with a text to the smartphone # given to [place] by the patient.         If the patient was scheduled for a telephone call, I informed the patient that the visit with the doctor would start with a call to the telephone # given to [place] by the patient.  4. I Informed patient they will receive a phone call 15 minutes prior to their appointment time from a CMA or nurse to review medications, allergies, etc. to prepare for the visit.    TELEPHONE CALL NOTE  Lauren King has been deemed a candidate for a follow-up tele-health visit to limit community exposure during the Covid-19 pandemic. I spoke with the patient via phone to ensure availability of phone/video source, confirm preferred email & phone number, and discuss instructions and expectations.  I reminded Lauren King to be prepared with  any vital sign and/or heart rhythm information that could potentially be obtained via home monitoring, at the time of her visit. I reminded QUANETTA King to expect a phone call prior to her visit.  Lauren King, NT 04/16/2019 2:00 PM     FULL LENGTH CONSENT FOR TELE-HEALTH VISIT   I hereby voluntarily request, consent and authorize [facility] and its employed or contracted physicians, physician assistants, nurse practitioners or other licensed health care professionals (the Practitioner), to provide me with telemedicine health care services (the "Services") as deemed necessary by the treating Practitioner. I acknowledge and consent to receive the Services by the Practitioner via  telemedicine. I understand that the telemedicine visit will involve communicating with the Practitioner through live audiovisual communication technology and the disclosure of certain medical information by electronic transmission. I acknowledge that I have been given the opportunity to request an in-person assessment or other available alternative prior to the telemedicine visit and am voluntarily participating in the telemedicine visit.  I understand that I have the right to withhold or withdraw my consent to the use of telemedicine in the course of my care at any time, without affecting my right to future care or treatment, and that the Practitioner or I may terminate the telemedicine visit at any time. I understand that I have the right to inspect all information obtained and/or recorded in the course of the telemedicine visit and may receive copies of available information for a reasonable fee.  I understand that some of the potential risks of receiving the Services via telemedicine include:  [name] Delay or interruption in medical evaluation due to technological equipment failure or disruption; . Information transmitted may not be sufficient (e.g. poor resolution of images) to allow for appropriate medical decision making by the Practitioner; and/or  . In rare instances, security protocols could fail, causing a breach of personal health information.  Furthermore, I acknowledge that it is my responsibility to provide information about my medical history, conditions and care that is complete and accurate to the best of my ability. I acknowledge that Practitioner's advice, recommendations, and/or decision may be based on factors not within their control, such as incomplete or inaccurate data provided by me or distortions of diagnostic images or specimens that may result from electronic transmissions. I understand that the practice of medicine is not an exact science and that Practitioner makes no warranties or  guarantees regarding treatment outcomes. I acknowledge that I will receive a copy of this consent concurrently upon execution via email to the email address I last provided but may also request a printed copy by calling the office of [place].    I understand that my insurance will be billed for this visit.   I have read or had this consent read to me. . I understand the contents of this consent, which adequately explains the benefits and risks of the Services being provided via telemedicine.  . I have been provided ample opportunity to ask questions regarding this consent and the Services and have had my questions answered to my satisfaction. . I give my informed consent for the services to be provided through the use of telemedicine in my medical care  By participating in this telemedicine visit I agree to the above.

## 2019-04-16 NOTE — Progress Notes (Signed)
[facility] [place]  Patient name: Lauren SiCarolyn S Sagen MRN: [id number] DOB: August 10, 1950 Sex: female      Virtual Visit via Telephone Note   This visit type was conducted due to national recommendations for restrictions regarding the COVID-19 Pandemic (e.g. social distancing) in an effort to limit this patient's exposure and mitigate transmission in our community.  Due to her co-morbid illnesses, this patient is at least at moderate risk for complications without adequate follow up.  This format is felt to be most appropriate for this patient at this time.  The patient did not have access to video technology/had technical difficulties with video requiring transitioning to audio format only (telephone).  All issues noted in this document were discussed and addressed.  No physical exam could be performed with this format.  Patient Location: Home Provider Location: Office    REASON FOR APPOINTMENT:    Follow up  HISTORY OF PRESENT ILLNESS:   Lauren King is a 68 y.o. female who underwent left femoral to tibioperoneal trunk bypass graft with non-reversed ipsilateral translocated saphenous vein and concomitant left iliofemoral endarterectomy and popliteal endarterectomy on 12-22-2018.  The vein was marginal but it was used given  the tibial anastomosis.  Her bypass was done for rest pain  Her left leg feels much better.  The right leg has awaken her from sleep recently.  She feels it is getting a little better.  She does not have any open wounds.   She suffers from COPD.She states that she has quit smoking.She suffers from hypercholesterolemia which is managed with a statin. She takes aspirin a single agent antiplatelet therapy. She has obstructive sleep apnea and uses a CPAP machine.  The patient does not have symptoms concerning for COVID-19 infection (fever, chills, cough, or new shortness of breath).   PAST MEDICAL HISTORY    Past Medical  History:  Diagnosis Date  . Anxiety   . Chronic lower back pain   . COPD (chronic obstructive pulmonary disease) (HCC)   . Depression   . GERD (gastroesophageal reflux disease)   . Obstructive sleep apnea    CPAP     FAMILY HISTORY   Family History  Problem Relation Age of Onset  . Lung cancer Sister   . Lung cancer Brother   . Colon cancer Neg Hx     SOCIAL HISTORY:   Social History   Socioeconomic History  . Marital status: Divorced    Spouse name: Not on file  . Number of children: Not on file  . Years of education: Not on file  . Highest education level: Not on file  Occupational History  . Not on file  Social Needs  . Financial resource strain: Not on file  . Food insecurity    Worry: Not on file    Inability: Not on file  . Transportation needs    Medical: Not on file    Non-medical: Not on file  Tobacco Use  . Smoking status: Former Smoker    Packs/day: 0.40    Years: 40.00    Pack years: 16.00    Types: Cigarettes  . Smokeless tobacco: Never Used  . Tobacco comment: 5 a day  Substance and Sexual Activity  . Alcohol use: No  . Drug use: No  . Sexual activity: Not on file  Lifestyle  . Physical activity    Days per week: Not on file  Minutes per session: Not on file  . Stress: Not on file  Relationships  . Social Musician on phone: Not on file    Gets together: Not on file    Attends religious service: Not on file    Active member of club or organization: Not on file    Attends meetings of clubs or organizations: Not on file    Relationship status: Not on file  . Intimate partner violence    Fear of current or ex partner: Not on file    Emotionally abused: Not on file    Physically abused: Not on file    Forced sexual activity: Not on file  Other Topics Concern  . Not on file  Social History Narrative  . Not on file    ALLERGIES:    No Known Allergies  CURRENT MEDICATIONS:    Current Outpatient Medications   Medication Sig Dispense Refill  . albuterol (PROVENTIL) (2.5 MG/3ML) 0.083% nebulizer solution Take 2.5 mg by nebulization every 6 (six) hours as needed for wheezing or shortness of breath.     [name] albuterol (VENTOLIN HFA) 108 (90 Base) MCG/ACT inhaler Inhale 2 puffs into the lungs every 6 (six) hours as needed for wheezing or shortness of breath.     [name] aspirin EC 81 MG tablet Take 81 mg by mouth daily.    . DUREZOL 0.05 % EMUL Place 1 drop into the right eye 3 (three) times daily.     [name] gatifloxacin (ZYMAXID) 0.5 % SOLN INSTILL ONE DROP IN LEFT EYE FOUR TIMES DAILY AS DIRECTED    . ibuprofen (ADVIL) 800 MG tablet Take 800 mg by mouth every 8 (eight) hours as needed for moderate pain.    [name] ketorolac (ACULAR) 0.5 % ophthalmic solution Place 1 drop into the right eye 3 (three) times daily.    . mirtazapine (REMERON) 30 MG tablet Take 1 tablet (30 mg total) by mouth at bedtime. 90 tablet 1  . oxyCODONE (OXY IR/ROXICODONE) 5 MG immediate release tablet Take 1 tablet (5 mg total) by mouth every 6 (six) hours as needed for moderate pain. 20 tablet 0  . OXYGEN Inhale 2 L into the lungs at bedtime.    . rosuvastatin (CRESTOR) 5 MG tablet Take 1 tablet (5 mg total) by mouth daily. 30 tablet 3  . traMADol (ULTRAM) 50 MG tablet Take 50 mg by mouth 2 (two) times daily.    . TRELEGY ELLIPTA 100-62.5-25 MCG/INH AEPB Inhale 1 puff into the lungs daily.     No current facility-administered medications for this visit.     REVIEW OF SYSTEMS:   Please see the history of present illness.     All other systems reviewed and are negative.  PHYSICAL EXAM:   There were no vitals filed for this visit.  GENERAL: The patient is in no acute distress. PULMONARY: Nonlabored breathing PSYCHIATRIC: The patient has a normal affect.   Recent Labs: 12/19/2018: ALT 23 12/23/2018: BUN 7; Creatinine, Ser 0.74; Hemoglobin 10.8; Platelets 255; Potassium 3.8; Sodium 137   Recent Lipid Panel No results found for: CHOL,  TRIG, HDL, CHOLHDL, LDLCALC, LDLDIRECT  Wt Readings from Last 3 Encounters:  01/22/19 81 lb 14.4 oz (37.1 kg)  12/25/18 82 lb (37.2 kg)  12/19/18 84 lb 8 oz (38.3 kg)     STUDIES:   I have reviewed the following: +-------+-----------+-----------+------------+------------+ ABI/TBIToday's ABIToday's TBIPrevious ABIPrevious TBI +-------+-----------+-----------+------------+------------+ Right  0.58       0.23                                +-------+-----------+-----------+------------+------------+  Left   0.7        0.42                                +-------+-----------+-----------+------------+------------+ Left: Patent graft. 50-74% inflow stenosis. 50-74% outflow stenosis by velocity but 75-99% by ratio.  ASSESSMENT and PLAN   Atherosclerotic lower extremity vascular disease: We discussed that there are velocity elevations within the left leg bypass.  I do not think they need to be evaluated with angiography at this time however I will need to keep a close watch.  I Georgina Peer have her back in 3 months for repeat imaging.  If the velocity elevations increase, she would need to have angiography performed.  She is now having the early stages of progression of disease in the right leg.  I do not feel that she is at the point where intervention is required however we will continue to monitor her closely.  She will continue with optimal medical therapy.  She potentially could have endovascular options to treat the right leg.  This will again be evaluated in 3 months.   COVID-19 Education: The signs and symptoms of COVID-19 were discussed with the patient and how to seek care for testing (follow up with PCP or arrange E-visit).  The importance of social distancing was discussed today.  Time:   Today, I have spent 14 minutes with the patient with telehealth technology discussing the above problems.     Leia Alf, MD, FACS [facility]  [facility] [phone] Pager [phone]

## 2019-04-24 DIAGNOSIS — F339 Major depressive disorder, recurrent, unspecified: Secondary | ICD-10-CM | POA: Diagnosis not present

## 2019-04-24 DIAGNOSIS — J449 Chronic obstructive pulmonary disease, unspecified: Secondary | ICD-10-CM | POA: Diagnosis not present

## 2019-04-24 DIAGNOSIS — E43 Unspecified severe protein-calorie malnutrition: Secondary | ICD-10-CM | POA: Diagnosis not present

## 2019-04-24 DIAGNOSIS — E785 Hyperlipidemia, unspecified: Secondary | ICD-10-CM | POA: Diagnosis not present

## 2019-05-07 ENCOUNTER — Other Ambulatory Visit: Payer: Self-pay | Admitting: *Deleted

## 2019-05-07 DIAGNOSIS — I739 Peripheral vascular disease, unspecified: Secondary | ICD-10-CM

## 2019-05-07 DIAGNOSIS — I70223 Atherosclerosis of native arteries of extremities with rest pain, bilateral legs: Secondary | ICD-10-CM

## 2019-05-09 DIAGNOSIS — J449 Chronic obstructive pulmonary disease, unspecified: Secondary | ICD-10-CM | POA: Diagnosis not present

## 2019-05-25 ENCOUNTER — Ambulatory Visit (HOSPITAL_COMMUNITY): Payer: Medicare HMO | Admitting: Psychiatry

## 2019-05-25 DIAGNOSIS — M1990 Unspecified osteoarthritis, unspecified site: Secondary | ICD-10-CM | POA: Diagnosis not present

## 2019-05-25 DIAGNOSIS — M549 Dorsalgia, unspecified: Secondary | ICD-10-CM | POA: Diagnosis not present

## 2019-06-09 DIAGNOSIS — J449 Chronic obstructive pulmonary disease, unspecified: Secondary | ICD-10-CM | POA: Diagnosis not present

## 2019-06-11 ENCOUNTER — Other Ambulatory Visit: Payer: Self-pay

## 2019-06-11 ENCOUNTER — Ambulatory Visit: Payer: Medicare HMO | Admitting: Psychiatry

## 2019-06-25 DIAGNOSIS — M1990 Unspecified osteoarthritis, unspecified site: Secondary | ICD-10-CM | POA: Diagnosis not present

## 2019-06-25 DIAGNOSIS — J449 Chronic obstructive pulmonary disease, unspecified: Secondary | ICD-10-CM | POA: Diagnosis not present

## 2019-06-27 DIAGNOSIS — H524 Presbyopia: Secondary | ICD-10-CM | POA: Diagnosis not present

## 2019-06-27 DIAGNOSIS — H26493 Other secondary cataract, bilateral: Secondary | ICD-10-CM | POA: Diagnosis not present

## 2019-06-27 DIAGNOSIS — Z0100 Encounter for examination of eyes and vision without abnormal findings: Secondary | ICD-10-CM | POA: Diagnosis not present

## 2019-06-27 DIAGNOSIS — Z961 Presence of intraocular lens: Secondary | ICD-10-CM | POA: Diagnosis not present

## 2019-07-08 DIAGNOSIS — J449 Chronic obstructive pulmonary disease, unspecified: Secondary | ICD-10-CM | POA: Diagnosis not present

## 2019-07-16 ENCOUNTER — Other Ambulatory Visit (HOSPITAL_COMMUNITY): Payer: Medicare HMO

## 2019-07-16 ENCOUNTER — Ambulatory Visit: Payer: Medicare HMO | Admitting: Surgery

## 2019-07-16 ENCOUNTER — Encounter (HOSPITAL_COMMUNITY): Payer: Medicare HMO

## 2019-07-19 DIAGNOSIS — J449 Chronic obstructive pulmonary disease, unspecified: Secondary | ICD-10-CM | POA: Diagnosis not present

## 2019-07-19 DIAGNOSIS — Z1389 Encounter for screening for other disorder: Secondary | ICD-10-CM | POA: Diagnosis not present

## 2019-07-19 DIAGNOSIS — Z0001 Encounter for general adult medical examination with abnormal findings: Secondary | ICD-10-CM | POA: Diagnosis not present

## 2019-07-19 DIAGNOSIS — Z23 Encounter for immunization: Secondary | ICD-10-CM | POA: Diagnosis not present

## 2019-07-19 DIAGNOSIS — Z1331 Encounter for screening for depression: Secondary | ICD-10-CM | POA: Diagnosis not present

## 2019-07-19 DIAGNOSIS — E43 Unspecified severe protein-calorie malnutrition: Secondary | ICD-10-CM | POA: Diagnosis not present

## 2019-07-19 DIAGNOSIS — F3340 Major depressive disorder, recurrent, in remission, unspecified: Secondary | ICD-10-CM | POA: Diagnosis not present

## 2019-08-07 DIAGNOSIS — J449 Chronic obstructive pulmonary disease, unspecified: Secondary | ICD-10-CM | POA: Diagnosis not present

## 2019-08-13 DIAGNOSIS — I10 Essential (primary) hypertension: Secondary | ICD-10-CM | POA: Diagnosis not present

## 2019-08-13 DIAGNOSIS — G479 Sleep disorder, unspecified: Secondary | ICD-10-CM | POA: Diagnosis not present

## 2019-08-13 DIAGNOSIS — Z681 Body mass index (BMI) 19 or less, adult: Secondary | ICD-10-CM | POA: Diagnosis not present

## 2019-08-13 DIAGNOSIS — J449 Chronic obstructive pulmonary disease, unspecified: Secondary | ICD-10-CM | POA: Diagnosis not present

## 2019-08-13 DIAGNOSIS — Z87891 Personal history of nicotine dependence: Secondary | ICD-10-CM | POA: Diagnosis not present

## 2019-08-13 DIAGNOSIS — I70203 Unspecified atherosclerosis of native arteries of extremities, bilateral legs: Secondary | ICD-10-CM | POA: Diagnosis not present

## 2019-08-13 DIAGNOSIS — Z95820 Peripheral vascular angioplasty status with implants and grafts: Secondary | ICD-10-CM | POA: Diagnosis not present

## 2019-08-13 DIAGNOSIS — J9610 Chronic respiratory failure, unspecified whether with hypoxia or hypercapnia: Secondary | ICD-10-CM | POA: Diagnosis not present

## 2019-08-13 DIAGNOSIS — E46 Unspecified protein-calorie malnutrition: Secondary | ICD-10-CM | POA: Diagnosis not present

## 2019-08-19 DIAGNOSIS — M549 Dorsalgia, unspecified: Secondary | ICD-10-CM | POA: Diagnosis not present

## 2019-08-19 DIAGNOSIS — E785 Hyperlipidemia, unspecified: Secondary | ICD-10-CM | POA: Diagnosis not present

## 2019-08-27 ENCOUNTER — Ambulatory Visit (INDEPENDENT_AMBULATORY_CARE_PROVIDER_SITE_OTHER)
Admission: RE | Admit: 2019-08-27 | Discharge: 2019-08-27 | Disposition: A | Discharge status: Home / Self Care | Payer: Medicare HMO | Source: Ambulatory Visit | Attending: Surgery | Admitting: Surgery

## 2019-08-27 ENCOUNTER — Ambulatory Visit (HOSPITAL_COMMUNITY)
Admission: RE | Admit: 2019-08-27 | Discharge: 2019-08-27 | Disposition: A | Discharge status: Home / Self Care | Payer: Medicare HMO | Source: Ambulatory Visit | Attending: Surgery | Admitting: Surgery

## 2019-08-27 ENCOUNTER — Ambulatory Visit (INDEPENDENT_AMBULATORY_CARE_PROVIDER_SITE_OTHER): Payer: Medicare HMO | Admitting: Surgery

## 2019-08-27 ENCOUNTER — Other Ambulatory Visit: Payer: Self-pay

## 2019-08-27 ENCOUNTER — Encounter: Payer: Self-pay | Admitting: Surgery

## 2019-08-27 VITALS — BP 146/79 | HR 68 | Temp 97.7°F | Resp 20 | Ht 61.0 in | Wt 99.6 lb

## 2019-08-27 DIAGNOSIS — I70223 Atherosclerosis of native arteries of extremities with rest pain, bilateral legs: Secondary | ICD-10-CM | POA: Diagnosis not present

## 2019-08-27 DIAGNOSIS — I739 Peripheral vascular disease, unspecified: Secondary | ICD-10-CM

## 2019-08-27 NOTE — H&P (View-Only) (Signed)
[facility] [place]  Patient name: Lauren King MRN: [id number] DOB: 11-04-1950 Sex: female   REASON FOR VISIT:    Follow up  [name]:   Lauren King a 68 y.o.femalewho underwent left femoral to tibioperoneal trunk bypass graft with non-reversed ipsilateral translocated saphenous vein and concomitant left iliofemoral endarterectomy and popliteal endarterectomy on 12-22-2018. The vein was marginal but it was used given  the tibial anastomosis. Her bypass was done for rest pain  She states that she is starting to have a return of her pain symptoms in both legs.  She suffers from COPD.She states that she has quit smoking.She suffers from hypercholesterolemia which is managed with a statin. She takes aspirin a single agent antiplatelet therapy. She has obstructive sleep apnea and uses a CPAP machine.   PAST MEDICAL HISTORY:   Past Medical History:  Diagnosis Date  . Anxiety   . Chronic lower back pain   . COPD (chronic obstructive pulmonary disease) (HCC)   . Depression   . GERD (gastroesophageal reflux disease)   . Obstructive sleep apnea    CPAP     FAMILY HISTORY:   Family History  Problem Relation Age of Onset  . Lung cancer Sister   . Lung cancer Brother   . Colon cancer Neg Hx     SOCIAL HISTORY:   Social History   Tobacco Use  . Smoking status: Former Smoker    Packs/day: 0.40    Years: 40.00    Pack years: 16.00    Types: Cigarettes  . Smokeless tobacco: Never Used  . Tobacco comment: 5 a day  Substance Use Topics  . Alcohol use: No     ALLERGIES:   No Known Allergies   CURRENT MEDICATIONS:   Current Outpatient Medications  Medication Sig Dispense Refill  . albuterol (PROVENTIL) (2.5 MG/3ML) 0.083% nebulizer solution Take 2.5 mg by nebulization every 6 (six) hours as needed for wheezing or shortness of breath.     [name] albuterol (VENTOLIN HFA) 108 (90  Base) MCG/ACT inhaler Inhale 2 puffs into the lungs every 6 (six) hours as needed for wheezing or shortness of breath.     [name] aspirin EC 81 MG tablet Take 81 mg by mouth daily.    [name] ibuprofen (ADVIL) 800 MG tablet Take 800 mg by mouth every 8 (eight) hours as needed for moderate pain.    . mirtazapine (REMERON) 30 MG tablet Take 1 tablet (30 mg total) by mouth at bedtime. 90 tablet 1  . OXYGEN Inhale 2 L into the lungs at bedtime.    . rosuvastatin (CRESTOR) 5 MG tablet Take 1 tablet (5 mg total) by mouth daily. 30 tablet 3  . traMADol (ULTRAM) 50 MG tablet Take 50 mg by mouth 2 (two) times daily.    . TRELEGY ELLIPTA 100-62.5-25 MCG/INH AEPB Inhale 1 puff into the lungs daily.     No current facility-administered medications for this visit.    REVIEW OF SYSTEMS:   [X]  denotes positive finding, [ ]  denotes negative finding Cardiac  Comments:  Chest pain or chest pressure:    Shortness of breath upon exertion:    Short of breath when lying flat:    Irregular heart rhythm:        Vascular    Pain in calf, thigh, or hip brought on by ambulation: x   Pain in feet at night that wakes you up from your sleep:  x   Blood  clot in your veins:    Leg swelling:         Pulmonary    Oxygen at home:    Productive cough:     Wheezing:         Neurologic    Sudden weakness in arms or legs:     Sudden numbness in arms or legs:     Sudden onset of difficulty speaking or slurred speech:    Temporary loss of vision in one eye:     Problems with dizziness:         Gastrointestinal    Blood in stool:     Vomited blood:         Genitourinary    Burning when urinating:     Blood in urine:        Psychiatric    Major depression:         Hematologic    Bleeding problems:    Problems with blood clotting too easily:        Skin    Rashes or ulcers:        Constitutional    Fever or chills:      PHYSICAL EXAM:   Vitals:   08/27/19 1154  BP: (!) 146/79  Pulse: 68  Resp: 20  Temp:  97.7 F (36.5 C)  SpO2: 95%  Weight: 99 lb 9.6 oz (45.2 kg)  Height: 5\' 1"  (1.549 m)    GENERAL: The patient is a well-nourished female, in no acute distress. The vital signs are documented above. CARDIAC: There is a regular rate and rhythm.  VASCULAR: Nonpalpable pedal pulses PULMONARY: Non-labored respirations MUSCULOSKELETAL: There are no major deformities or cyanosis. NEUROLOGIC: No focal weakness or paresthesias are detected. SKIN: There are no ulcers or rashes noted. PSYCHIATRIC: The patient has a normal affect.  STUDIES:   I have reviewed the following:  +-------+-----------+-----------+------------+------------+  ABI/TBIToday's ABIToday's TBIPrevious ABIPrevious TBI  +-------+-----------+-----------+------------+------------+  Right 0.51    0.27    0.58    0.23      +-------+-----------+-----------+------------+------------+  Left  0.56    0.00    0.70    0.42      +-------+-----------+-----------+------------+------------+   LE Duplex: Left: Femoral to tibioperoneal graft is patent with thrombus and lumen  reduction identified involving the proximal anastomosis. The adjacent  native outflow artery is not visualized.  MEDICAL ISSUES:   Bilateral lower extremity atherosclerotic vascular disease: Patient status post left femoral to TP trunk bypass graft with a marginal vein.  Ultrasound has suggested progression of stenosis within her vein graft.  She has had a slight drop in her ABIs on the left.  I discussed with the patient that neck step is to proceed with angiography to better define the appearance of her vein graft and to intervene if indicated.  This will be done through a right femoral approach.  I will get images of the right side as she is also having symptoms on the right.  This is been scheduled for Tuesday, April 27.    Leia Alf, MD, FACS [facility] of [facility] [phone] Pager [phone]

## 2019-08-27 NOTE — Progress Notes (Signed)
[facility]ist of [place]  Patient name: Lauren King MRN: [id number] DOB: 11-04-1950 Sex: female   REASON FOR VISIT:    Follow up  [name]:   Lauren King a 68 y.o.femalewho underwent left femoral to tibioperoneal trunk bypass graft with non-reversed ipsilateral translocated saphenous vein and concomitant left iliofemoral endarterectomy and popliteal endarterectomy on 12-22-2018. The vein was marginal but it was used given  the tibial anastomosis. Her bypass was done for rest pain  She states that she is starting to have a return of her pain symptoms in both legs.  She suffers from COPD.She states that she has quit smoking.She suffers from hypercholesterolemia which is managed with a statin. She takes aspirin a single agent antiplatelet therapy. She has obstructive sleep apnea and uses a CPAP machine.   PAST MEDICAL HISTORY:   Past Medical History:  Diagnosis Date  . Anxiety   . Chronic lower back pain   . COPD (chronic obstructive pulmonary disease) (HCC)   . Depression   . GERD (gastroesophageal reflux disease)   . Obstructive sleep apnea    CPAP     FAMILY HISTORY:   Family History  Problem Relation Age of Onset  . Lung cancer Sister   . Lung cancer Brother   . Colon cancer Neg Hx     SOCIAL HISTORY:   Social History   Tobacco Use  . Smoking status: Former Smoker    Packs/day: 0.40    Years: 40.00    Pack years: 16.00    Types: Cigarettes  . Smokeless tobacco: Never Used  . Tobacco comment: 5 a day  Substance Use Topics  . Alcohol use: No     ALLERGIES:   No Known Allergies   CURRENT MEDICATIONS:   Current Outpatient Medications  Medication Sig Dispense Refill  . albuterol (PROVENTIL) (2.5 MG/3ML) 0.083% nebulizer solution Take 2.5 mg by nebulization every 6 (six) hours as needed for wheezing or shortness of breath.     [name] albuterol (VENTOLIN HFA) 108 (90  Base) MCG/ACT inhaler Inhale 2 puffs into the lungs every 6 (six) hours as needed for wheezing or shortness of breath.     [name] aspirin EC 81 MG tablet Take 81 mg by mouth daily.    [name] ibuprofen (ADVIL) 800 MG tablet Take 800 mg by mouth every 8 (eight) hours as needed for moderate pain.    . mirtazapine (REMERON) 30 MG tablet Take 1 tablet (30 mg total) by mouth at bedtime. 90 tablet 1  . OXYGEN Inhale 2 L into the lungs at bedtime.    . rosuvastatin (CRESTOR) 5 MG tablet Take 1 tablet (5 mg total) by mouth daily. 30 tablet 3  . traMADol (ULTRAM) 50 MG tablet Take 50 mg by mouth 2 (two) times daily.    . TRELEGY ELLIPTA 100-62.5-25 MCG/INH AEPB Inhale 1 puff into the lungs daily.     No current facility-administered medications for this visit.    REVIEW OF SYSTEMS:   [X]  denotes positive finding, [ ]  denotes negative finding Cardiac  Comments:  Chest pain or chest pressure:    Shortness of breath upon exertion:    Short of breath when lying flat:    Irregular heart rhythm:        Vascular    Pain in calf, thigh, or hip brought on by ambulation: x   Pain in feet at night that wakes you up from your sleep:  x   Blood  clot in your veins:    Leg swelling:         Pulmonary    Oxygen at home:    Productive cough:     Wheezing:         Neurologic    Sudden weakness in arms or legs:     Sudden numbness in arms or legs:     Sudden onset of difficulty speaking or slurred speech:    Temporary loss of vision in one eye:     Problems with dizziness:         Gastrointestinal    Blood in stool:     Vomited blood:         Genitourinary    Burning when urinating:     Blood in urine:        Psychiatric    Major depression:         Hematologic    Bleeding problems:    Problems with blood clotting too easily:        Skin    Rashes or ulcers:        Constitutional    Fever or chills:      PHYSICAL EXAM:   Vitals:   08/27/19 1154  BP: (!) 146/79  Pulse: 68  Resp: 20  Temp:  97.7 F (36.5 C)  SpO2: 95%  Weight: 99 lb 9.6 oz (45.2 kg)  Height: 5' 1" (1.549 m)    GENERAL: The patient is a well-nourished female, in no acute distress. The vital signs are documented above. CARDIAC: There is a regular rate and rhythm.  VASCULAR: Nonpalpable pedal pulses PULMONARY: Non-labored respirations MUSCULOSKELETAL: There are no major deformities or cyanosis. NEUROLOGIC: No focal weakness or paresthesias are detected. SKIN: There are no ulcers or rashes noted. PSYCHIATRIC: The patient has a normal affect.  STUDIES:   I have reviewed the following:  +-------+-----------+-----------+------------+------------+  ABI/TBIToday's ABIToday's TBIPrevious ABIPrevious TBI  +-------+-----------+-----------+------------+------------+  Right 0.51    0.27    0.58    0.23      +-------+-----------+-----------+------------+------------+  Left  0.56    0.00    0.70    0.42      +-------+-----------+-----------+------------+------------+   LE Duplex: Left: Femoral to tibioperoneal graft is patent with thrombus and lumen  reduction identified involving the proximal anastomosis. The adjacent  native outflow artery is not visualized.  MEDICAL ISSUES:   Bilateral lower extremity atherosclerotic vascular disease: Patient status post left femoral to TP trunk bypass graft with a marginal vein.  Ultrasound has suggested progression of stenosis within her vein graft.  She has had a slight drop in her ABIs on the left.  I discussed with the patient that neck step is to proceed with angiography to better define the appearance of her vein graft and to intervene if indicated.  This will be done through a right femoral approach.  I will get images of the right side as she is also having symptoms on the right.  This is been scheduled for Tuesday, April 27.    Wells Dillyn Menna, IV, MD, FACS [facility] [place] Tel [phone] Pager [phone] 

## 2019-09-07 ENCOUNTER — Other Ambulatory Visit: Payer: Self-pay

## 2019-09-07 ENCOUNTER — Other Ambulatory Visit (HOSPITAL_COMMUNITY)
Admission: RE | Admit: 2019-09-07 | Discharge: 2019-09-07 | Disposition: A | Discharge status: Home / Self Care | Payer: Medicare HMO | Source: Ambulatory Visit | Attending: Surgery | Admitting: Surgery

## 2019-09-07 DIAGNOSIS — J449 Chronic obstructive pulmonary disease, unspecified: Secondary | ICD-10-CM | POA: Diagnosis not present

## 2019-09-07 DIAGNOSIS — Z20822 Contact with and (suspected) exposure to covid-19: Secondary | ICD-10-CM | POA: Insufficient documentation

## 2019-09-07 DIAGNOSIS — Z01812 Encounter for preprocedural laboratory examination: Secondary | ICD-10-CM | POA: Insufficient documentation

## 2019-09-08 LAB — SARS CORONAVIRUS 2 (TAT 6-24 HRS): SARS Coronavirus 2: NEGATIVE

## 2019-09-11 ENCOUNTER — Ambulatory Visit (HOSPITAL_COMMUNITY)
Admission: RE | Admit: 2019-09-11 | Discharge: 2019-09-11 | Disposition: A | Discharge status: Home / Self Care | Payer: Medicare HMO | Attending: Surgery | Admitting: Surgery

## 2019-09-11 ENCOUNTER — Encounter (HOSPITAL_COMMUNITY)
Admission: RE | Disposition: A | Discharge status: Home / Self Care | Payer: Self-pay | Source: Home / Self Care | Attending: Surgery

## 2019-09-11 ENCOUNTER — Other Ambulatory Visit: Payer: Self-pay

## 2019-09-11 DIAGNOSIS — I70222 Atherosclerosis of native arteries of extremities with rest pain, left leg: Secondary | ICD-10-CM | POA: Insufficient documentation

## 2019-09-11 DIAGNOSIS — Z87891 Personal history of nicotine dependence: Secondary | ICD-10-CM | POA: Insufficient documentation

## 2019-09-11 DIAGNOSIS — J449 Chronic obstructive pulmonary disease, unspecified: Secondary | ICD-10-CM | POA: Diagnosis not present

## 2019-09-11 DIAGNOSIS — F329 Major depressive disorder, single episode, unspecified: Secondary | ICD-10-CM | POA: Diagnosis not present

## 2019-09-11 DIAGNOSIS — F419 Anxiety disorder, unspecified: Secondary | ICD-10-CM | POA: Diagnosis not present

## 2019-09-11 DIAGNOSIS — K219 Gastro-esophageal reflux disease without esophagitis: Secondary | ICD-10-CM | POA: Diagnosis not present

## 2019-09-11 DIAGNOSIS — Z79899 Other long term (current) drug therapy: Secondary | ICD-10-CM | POA: Insufficient documentation

## 2019-09-11 DIAGNOSIS — G4733 Obstructive sleep apnea (adult) (pediatric): Secondary | ICD-10-CM | POA: Diagnosis not present

## 2019-09-11 DIAGNOSIS — Z7982 Long term (current) use of aspirin: Secondary | ICD-10-CM | POA: Insufficient documentation

## 2019-09-11 HISTORY — PX: PERIPHERAL VASCULAR INTERVENTION: CATH118257

## 2019-09-11 HISTORY — PX: ABDOMINAL AORTOGRAM W/LOWER EXTREMITY: CATH118223

## 2019-09-11 LAB — POCT I-STAT, CHEM 8
BUN: 13 mg/dL (ref 8–23)
Calcium, Ion: 1.24 mmol/L (ref 1.15–1.40)
Chloride: 105 mmol/L (ref 98–111)
Creatinine, Ser: 0.6 mg/dL (ref 0.44–1.00)
Glucose, Bld: 90 mg/dL (ref 70–99)
HCT: 45 % (ref 36.0–46.0)
Hemoglobin: 15.3 g/dL — ABNORMAL HIGH (ref 12.0–15.0)
Potassium: 3.9 mmol/L (ref 3.5–5.1)
Sodium: 140 mmol/L (ref 135–145)
TCO2: 26 mmol/L (ref 22–32)

## 2019-09-11 LAB — POCT ACTIVATED CLOTTING TIME: Activated Clotting Time: 225 seconds

## 2019-09-11 SURGERY — ABDOMINAL AORTOGRAM W/LOWER EXTREMITY
Anesthesia: LOCAL

## 2019-09-11 MED ORDER — ASPIRIN EC 81 MG PO TBEC: 81.0000 mg | DELAYED_RELEASE_TABLET | Freq: Every day | ORAL | Status: DC

## 2019-09-11 MED ORDER — MORPHINE SULFATE (PF) 2 MG/ML IV SOLN: 2.0000 mg | INTRAVENOUS | Status: DC | PRN

## 2019-09-11 MED ORDER — FENTANYL CITRATE (PF) 100 MCG/2ML IJ SOLN
INTRAMUSCULAR | Status: DC | PRN
  Administered 2019-09-11: 25 ug via INTRAVENOUS
  Administered 2019-09-11: 50 ug via INTRAVENOUS
  Administered 2019-09-11 (×2): 25 ug via INTRAVENOUS

## 2019-09-11 MED ORDER — SODIUM CHLORIDE 0.9 % IV SOLN: 250.0000 mL | INTRAVENOUS | Status: DC | PRN

## 2019-09-11 MED ORDER — HYDRALAZINE HCL 20 MG/ML IJ SOLN: 5.0000 mg | INTRAMUSCULAR | Status: DC | PRN

## 2019-09-11 MED ORDER — SODIUM CHLORIDE 0.9% FLUSH: 3.0000 mL | Freq: Two times a day (BID) | INTRAVENOUS | Status: DC

## 2019-09-11 MED ORDER — SODIUM CHLORIDE 0.9 % WEIGHT BASED INFUSION: 1.0000 mL/kg/h | INTRAVENOUS | Status: DC

## 2019-09-11 MED ORDER — SODIUM CHLORIDE 0.9% FLUSH: 3.0000 mL | INTRAVENOUS | Status: DC | PRN

## 2019-09-11 MED ORDER — CLOPIDOGREL BISULFATE 300 MG PO TABS
ORAL_TABLET | ORAL | Status: AC
  Filled 2019-09-11: qty 1

## 2019-09-11 MED ORDER — MIDAZOLAM HCL 2 MG/2ML IJ SOLN
INTRAMUSCULAR | Status: AC
  Filled 2019-09-11: qty 2

## 2019-09-11 MED ORDER — LIDOCAINE HCL (PF) 1 % IJ SOLN
INTRAMUSCULAR | Status: DC | PRN
  Administered 2019-09-11: 20 mL via INTRADERMAL

## 2019-09-11 MED ORDER — OXYCODONE HCL 5 MG PO TABS: 5.0000 mg | ORAL_TABLET | ORAL | Status: DC | PRN

## 2019-09-11 MED ORDER — CLOPIDOGREL BISULFATE 300 MG PO TABS
ORAL_TABLET | ORAL | Status: DC | PRN
  Administered 2019-09-11: 300 mg via ORAL

## 2019-09-11 MED ORDER — SODIUM CHLORIDE 0.9 % IV SOLN: INTRAVENOUS | Status: DC

## 2019-09-11 MED ORDER — FENTANYL CITRATE (PF) 100 MCG/2ML IJ SOLN
INTRAMUSCULAR | Status: AC
  Filled 2019-09-11: qty 2

## 2019-09-11 MED ORDER — ACETAMINOPHEN 325 MG PO TABS: 650.0000 mg | ORAL_TABLET | ORAL | Status: DC | PRN

## 2019-09-11 MED ORDER — CLOPIDOGREL BISULFATE 75 MG PO TABS: 300.0000 mg | ORAL_TABLET | Freq: Once | ORAL | Status: DC

## 2019-09-11 MED ORDER — HEPARIN SODIUM (PORCINE) 1000 UNIT/ML IJ SOLN
INTRAMUSCULAR | Status: AC
  Filled 2019-09-11: qty 1

## 2019-09-11 MED ORDER — HEPARIN SODIUM (PORCINE) 1000 UNIT/ML IJ SOLN
INTRAMUSCULAR | Status: DC | PRN
  Administered 2019-09-11: 5000 [IU] via INTRAVENOUS

## 2019-09-11 MED ORDER — CLOPIDOGREL BISULFATE 75 MG PO TABS: 75.0000 mg | ORAL_TABLET | Freq: Every day | ORAL | Status: DC

## 2019-09-11 MED ORDER — ONDANSETRON HCL 4 MG/2ML IJ SOLN: 4.0000 mg | Freq: Four times a day (QID) | INTRAMUSCULAR | Status: DC | PRN

## 2019-09-11 MED ORDER — OXYCODONE-ACETAMINOPHEN 5-325 MG PO TABS: 1.0000 | ORAL_TABLET | ORAL | 0 refills | Status: DC | PRN

## 2019-09-11 MED ORDER — CLOPIDOGREL BISULFATE 75 MG PO TABS
75.0000 mg | ORAL_TABLET | Freq: Every day | ORAL | 11 refills | Status: DC
Start: 2019-09-11 — End: 2019-12-21

## 2019-09-11 MED ORDER — LABETALOL HCL 5 MG/ML IV SOLN: 10.0000 mg | INTRAVENOUS | Status: DC | PRN

## 2019-09-11 MED ORDER — IODIXANOL 320 MG/ML IV SOLN
INTRAVENOUS | Status: DC | PRN
  Administered 2019-09-11: 150 mL via INTRA_ARTERIAL

## 2019-09-11 MED ORDER — MIDAZOLAM HCL 2 MG/2ML IJ SOLN
INTRAMUSCULAR | Status: DC | PRN
  Administered 2019-09-11 (×4): 1 mg via INTRAVENOUS

## 2019-09-11 MED ORDER — HEPARIN (PORCINE) IN NACL 1000-0.9 UT/500ML-% IV SOLN
INTRAVENOUS | Status: AC
  Filled 2019-09-11: qty 1000

## 2019-09-11 MED ORDER — HEPARIN (PORCINE) IN NACL 1000-0.9 UT/500ML-% IV SOLN
INTRAVENOUS | Status: DC | PRN
  Administered 2019-09-11 (×2): 500 mL

## 2019-09-11 MED ORDER — LIDOCAINE HCL (PF) 1 % IJ SOLN
INTRAMUSCULAR | Status: AC
  Filled 2019-09-11: qty 30

## 2019-09-11 SURGICAL SUPPLY — 21 items
BALLN STERLING OTW 3X40X150 (BALLOONS) ×3
BALLN STERLING OTW 6X60X135 (BALLOONS) ×3
BALLOON STERLING OTW 3X40X150 (BALLOONS) IMPLANT
BALLOON STERLING OTW 6X60X135 (BALLOONS) IMPLANT
CATH OMNI FLUSH 5F 65CM (CATHETERS) ×1 IMPLANT
CLOSURE MYNX CONTROL 6F/7F (Vascular Products) ×1 IMPLANT
DRAPE ZERO GRAVITY STERILE (DRAPES) ×1 IMPLANT
KIT ENCORE 26 ADVANTAGE (KITS) ×1 IMPLANT
KIT MICROPUNCTURE NIT STIFF (SHEATH) ×1 IMPLANT
KIT PV (KITS) ×3 IMPLANT
SHEATH FLEX ANSEL ANG 6F 45CM (SHEATH) ×1 IMPLANT
SHEATH PINNACLE 5F 10CM (SHEATH) ×1 IMPLANT
SHEATH PINNACLE 6F 10CM (SHEATH) ×1 IMPLANT
SHEATH PROBE COVER 6X72 (BAG) ×1 IMPLANT
STENT ELUVIA 7X40X130 (Permanent Stent) ×1 IMPLANT
STENT ELUVIA 7X60X130 (Permanent Stent) ×1 IMPLANT
SYR MEDRAD MARK V 150ML (SYRINGE) ×1 IMPLANT
TRANSDUCER W/STOPCOCK (MISCELLANEOUS) ×3 IMPLANT
TRAY PV CATH (CUSTOM PROCEDURE TRAY) ×3 IMPLANT
WIRE BENTSON .035X145CM (WIRE) ×1 IMPLANT
WIRE G V18X300CM (WIRE) ×2 IMPLANT

## 2019-09-11 NOTE — Discharge Instructions (Signed)
Femoral Site Care This sheet gives you information about how to care for yourself after your procedure. Your health care provider may also give you more specific instructions. If you have problems or questions, contact your health care provider. What can I expect after the procedure? After the procedure, it is common to have:  Bruising that usually fades within 1-2 weeks.  Tenderness at the site. Follow these instructions at home: Wound care  Follow instructions from your health care provider about how to take care of your insertion site. Make sure you: ? Wash your hands with soap and water before you change your bandage (dressing). If soap and water are not available, use hand sanitizer. ? Change your dressing as told by your health care provider. ? Leave stitches (sutures), skin glue, or adhesive strips in place. These skin closures may need to stay in place for 2 weeks or longer. If adhesive strip edges start to loosen and curl up, you may trim the loose edges. Do not remove adhesive strips completely unless your health care provider tells you to do that.  Do not take baths, swim, or use a hot tub until your health care provider approves.  You may shower 24-48 hours after the procedure or as told by your health care provider. ? Gently wash the site with plain soap and water. ? Pat the area dry with a clean towel. ? Do not rub the site. This may cause bleeding.  Do not apply powder or lotion to the site. Keep the site clean and dry.  Check your femoral site every day for signs of infection. Check for: ? Redness, swelling, or pain. ? Fluid or blood. ? Warmth. ? Pus or a bad smell. Activity  For the first 2-3 days after your procedure, or as long as directed: ? Avoid climbing stairs as much as possible. ? Do not squat.  Do not lift anything that is heavier than 10 lb (4.5 kg), or the limit that you are told, until your health care provider says that it is safe.  Rest as  directed. ? Avoid sitting for a long time without moving. Get up to take short walks every 1-2 hours.  Do not drive for 24 hours if you were given a medicine to help you relax (sedative). General instructions  Take over-the-counter and prescription medicines only as told by your health care provider.  Keep all follow-up visits as told by your health care provider. This is important. Contact a health care provider if you have:  A fever or chills.  You have redness, swelling, or pain around your insertion site. Get help right away if:  The catheter insertion area swells very fast.  You pass out.  You suddenly start to sweat or your skin gets clammy.  The catheter insertion area is bleeding, and the bleeding does not stop when you hold steady pressure on the area.  The area near or just beyond the catheter insertion site becomes pale, cool, tingly, or numb. These symptoms may represent a serious problem that is an emergency. Do not wait to see if the symptoms will go away. Get medical help right away. Call your local emergency services (911 in the [place] Do not drive yourself to the hospital. Summary  After the procedure, it is common to have bruising that usually fades within 1-2 weeks.  Check your femoral site every day for signs of infection.  Do not lift anything that is heavier than 10 lb (4.5 kg), or the   limit that you are told, until your health care provider says that it is safe. This information is not intended to replace advice given to you by your health care provider. Make sure you discuss any questions you have with your health care provider. Document Revised: 05/09/2017 Document Reviewed: 05/09/2017 Elsevier Patient Education  2020 [employer]  

## 2019-09-11 NOTE — Interval H&P Note (Signed)
History and Physical Interval Note:  09/11/2019 12:19 PM  Lauren King  has presented today for surgery, with the diagnosis of pad.  The various methods of treatment have been discussed with the patient and family. After consideration of risks, benefits and other options for treatment, the patient has consented to  Procedure(s): ABDOMINAL AORTOGRAM W/LOWER EXTREMITY (N/A) as a surgical intervention.  The patient's history has been reviewed, patient examined, no change in status, stable for surgery.  I have reviewed the patient's chart and labs.  Questions were answered to the patient's satisfaction.     [name]

## 2019-09-11 NOTE — Op Note (Signed)
Patient name: Lauren King MRN: [id number] DOB: 04-Jun-1950 Sex: female  09/11/2019 Pre-operative Diagnosis: rest pain left leg Post-operative diagnosis:  Same Surgeon:  [name] Procedure Performed:  1.  Ultrasound-guided access, right femoral artery  2.  Abdominal aortogram  3.  Bilateral lower extremity runoff  4.  Angioplasty, left tibioperoneal trunk and peroneal artery  5.  Stent, left external iliac artery  6.  Conscious sedation, 67 minutes  7.  Closure device, Mynx   Indications: The patient has previously undergone a left femoral to tibioperoneal trunk bypass graft with vein.  Ultrasound indicates proximal stenosis.  She is here for further evaluation.  She is having recurrent rest pain symptoms.  Procedure:  The patient was identified in the holding area and taken to room 8.  The patient was then placed supine on the table and prepped and draped in the usual sterile fashion.  A time out was called.  Conscious sedation was administered with the use of IV fentanyl and Versed under continuous physician and nurse monitoring.  Heart rate, blood pressure, and oxygen saturation were continuously monitored.  Total sedation time was 23minutes.  Ultrasound was used to evaluate the right common femoral artery.  It was patent .  A digital ultrasound image was acquired.  A micropuncture needle was used to access the right common femoral artery under ultrasound guidance.  An 018 wire was advanced without resistance and a micropuncture sheath was placed.  The 018 wire was removed and a benson wire was placed.  The micropuncture sheath was exchanged for a 5 french sheath.  An omniflush catheter was advanced over the wire to the level of L-1.  An abdominal angiogram was obtained.  Next, using the omniflush catheter and a benson wire, the aortic bifurcation was crossed and the catheter was placed into theleft external iliac artery and left runoff was obtained.  right runoff was performed via  retrograde sheath injections.  Findings:   Aortogram: No significant renal artery stenosis.  The infrarenal abdominal aorta is patent throughout its course without significant stenosis.  The right common and external iliac artery are small in caliber but patent without stenosis.  The left common iliac artery is widely patent.  There is significant diameter reducing stenosis within the left external iliac artery, greater than 60%.    Right Lower Extremity: The right common femoral and profundofemoral artery are patent throughout the course.  The superficial femoral artery is occluded just beyond its origin.  There is reconstitution of the popliteal artery above the knee with single-vessel runoff via the peroneal artery.  Left Lower Extremity: There is patulous dilatation of the left common femoral artery.  The profundofemoral arteries widely patent.  Bypass graft is visualized originating from the common femoral artery which is patent without stenosis.  The outflow artery is occluded.  The bypass graft is remaining patent via retrograde flow of the popliteal artery.  There is delayed reconstitution of the peroneal and posterior tibial artery.  The posterior tibial artery is the dominant vessel across the ankle.  Intervention: After the above images were acquired the decision was made to proceed with intervention.  A 6 French 45 cm sheath was advanced into the left femoral tibial bypass graft.  The patient was fully heparinized.  Using a 3 x 60 Sterling balloon and a V-18 wire, I was able to easily cross the occluded tibioperoneal trunk.  I performed primary balloon angioplasty of the peroneal artery and the tibioperoneal trunk using least  3 mm Sterling balloon.  Follow-up imaging revealed resolution of the occlusion and widely patent peroneal and posterior tibial artery.  Next attention was turned towards the stenosis of the external iliac artery.  I elected to primarily stent this.  A 7 x 60 and a 7 x 40  overlapping Elluvia stents were placed into the external iliac artery and postdilated with a 6 mm balloon.  Completion imaging showed resolution of the stenosis.  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  I was able to cross the occlusion in the tibioperoneal trunk and treat this with balloon angioplasty extending into the peroneal artery using a 3 mm balloon which resulted then returned patency of the outflow tract.  #3  Right SFA occlusion     V. [name], M.D., [facility] [place] Office: [phone] Pager:  [phone]

## 2019-09-11 NOTE — Progress Notes (Signed)
Patient and daughter was given discharge instructions. Both verbalized understanding. 

## 2019-09-18 DIAGNOSIS — F339 Major depressive disorder, recurrent, unspecified: Secondary | ICD-10-CM | POA: Diagnosis not present

## 2019-09-18 DIAGNOSIS — E785 Hyperlipidemia, unspecified: Secondary | ICD-10-CM | POA: Diagnosis not present

## 2019-09-24 DIAGNOSIS — E785 Hyperlipidemia, unspecified: Secondary | ICD-10-CM | POA: Diagnosis not present

## 2019-09-24 DIAGNOSIS — Z0001 Encounter for general adult medical examination with abnormal findings: Secondary | ICD-10-CM | POA: Diagnosis not present

## 2019-09-24 DIAGNOSIS — J449 Chronic obstructive pulmonary disease, unspecified: Secondary | ICD-10-CM | POA: Diagnosis not present

## 2019-10-07 DIAGNOSIS — J449 Chronic obstructive pulmonary disease, unspecified: Secondary | ICD-10-CM | POA: Diagnosis not present

## 2019-10-18 ENCOUNTER — Other Ambulatory Visit: Payer: Self-pay | Admitting: *Deleted

## 2019-10-18 DIAGNOSIS — I739 Peripheral vascular disease, unspecified: Secondary | ICD-10-CM

## 2019-10-18 DIAGNOSIS — I70223 Atherosclerosis of native arteries of extremities with rest pain, bilateral legs: Secondary | ICD-10-CM

## 2019-10-19 DIAGNOSIS — E785 Hyperlipidemia, unspecified: Secondary | ICD-10-CM | POA: Diagnosis not present

## 2019-10-19 DIAGNOSIS — F339 Major depressive disorder, recurrent, unspecified: Secondary | ICD-10-CM | POA: Diagnosis not present

## 2019-10-22 ENCOUNTER — Ambulatory Visit (INDEPENDENT_AMBULATORY_CARE_PROVIDER_SITE_OTHER): Payer: Medicare HMO | Admitting: Physician Assistant

## 2019-10-22 ENCOUNTER — Ambulatory Visit (INDEPENDENT_AMBULATORY_CARE_PROVIDER_SITE_OTHER)
Admission: RE | Admit: 2019-10-22 | Discharge: 2019-10-22 | Disposition: A | Discharge status: Home / Self Care | Payer: Medicare HMO | Source: Ambulatory Visit | Attending: Surgery | Admitting: Surgery

## 2019-10-22 ENCOUNTER — Ambulatory Visit (HOSPITAL_COMMUNITY)
Admission: RE | Admit: 2019-10-22 | Discharge: 2019-10-22 | Disposition: A | Discharge status: Home / Self Care | Payer: Medicare HMO | Source: Ambulatory Visit | Attending: Surgery | Admitting: Surgery

## 2019-10-22 ENCOUNTER — Other Ambulatory Visit: Payer: Self-pay

## 2019-10-22 VITALS — BP 147/72 | HR 55 | Temp 97.9°F | Resp 20 | Ht 61.0 in | Wt 94.1 lb

## 2019-10-22 DIAGNOSIS — I70223 Atherosclerosis of native arteries of extremities with rest pain, bilateral legs: Secondary | ICD-10-CM | POA: Insufficient documentation

## 2019-10-22 DIAGNOSIS — M79604 Pain in right leg: Secondary | ICD-10-CM

## 2019-10-22 DIAGNOSIS — I739 Peripheral vascular disease, unspecified: Secondary | ICD-10-CM

## 2019-10-22 DIAGNOSIS — G8929 Other chronic pain: Secondary | ICD-10-CM | POA: Diagnosis not present

## 2019-10-22 NOTE — Progress Notes (Signed)
HISTORY AND PHYSICAL     CC:  follow up. Requesting Provider:  Avon Gully, MD  HPI: This is a 69 y.o. female who is here today for follow up for angioplasty of the left tibioperoneal trunk and peroneal artery and stent of the left EIA on 09/11/2019 by Dr. Myra Gianotti.   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  I was able to cross the occlusion in the tibioperoneal trunk and treat this with balloon angioplasty extending into the peroneal artery using a 3 mm balloon which resulted then returned patency of the outflow tract.             #3  Right SFA occlusion   Right Lower Extremity: The right common femoral and profundofemoral artery are patent throughout the course.  The superficial femoral artery is occluded just beyond its origin.  There is reconstitution of the popliteal artery above the knee with single-vessel runoff via the peroneal artery.   She has hx of left femoral to tibioperoneal trunk bypass graft with non-reversed ipsilateral translocated saphenous vein and concomitant left iliofemoral endarterectomy and popliteal endarterectomyon 12-22-2018. The vein was marginal but it was usedgiventhe tibial anastomosis. Her bypass was done for rest pain.  At her visit in April, she was starting to have pain in both legs.    The pt returns today for follow up. She is complaining of right lower extremity fatigue within a few yards of walking. Cannot vacuum clean house due to this.  Wakes up at night with cramps in legs and must "walk it out".  The pt is on a statin for cholesterol management.    The pt is on an aspirin.    Other AC:  Plavix The pt is not on meds for hypertension.  The pt does not have  diabetes. Tobacco hx:  former  Past Medical History:  Diagnosis Date  . Anxiety   . Chronic lower back pain   . COPD (chronic obstructive pulmonary disease) (HCC)   . Depression   . GERD (gastroesophageal reflux disease)   . Obstructive sleep apnea    CPAP    Past Surgical History:  Procedure Laterality Date  . ABDOMINAL AORTOGRAM W/LOWER EXTREMITY N/A 09/11/2019   Procedure: ABDOMINAL AORTOGRAM W/LOWER EXTREMITY;  Surgeon: Nada Libman, MD;  Location: [facility];  Service: Cardiovascular;  Laterality: N/A;  . ABDOMINAL HYSTERECTOMY    . CATARACT EXTRACTION    . COLONOSCOPY  01/20/2005   YBO:FBPZWCHEN colon polyp/Diminutive polyps at 30 cm ablated/Normal rectum. hyperplastic polyp  . COLONOSCOPY N/A 03/25/2015   IDP:OEUMPN /left-sided diverticula  . ENDARTERECTOMY FEMORAL Left 12/22/2018   Procedure: Endarterectomy Left Femoral and Politeal Arteries;  Surgeon: Nada Libman, MD;  [facility] OR;  Service: Vascular;  Laterality: Left;  . ESOPHAGOGASTRODUODENOSCOPY  11/30/2004   RMR:A small patch of salmon-colored epithelium (salmon-like lesion in the distal esophagus)/Antral pre-pyloric erosions of uncertain significance, otherwise, normal. solitary duodenal bulbar AVM.No Barrett's.  . ESOPHAGOGASTRODUODENOSCOPY N/A 03/25/2015   TIR:WERXVQM reflux/schatzkis ring s/p dilation/HH/short segment barrett  . ESOPHAGOGASTRODUODENOSCOPY (EGD) WITH ESOPHAGEAL DILATION N/A 09/22/2012   Rourk: erosive RE, biopsy negative for  Barrett's. Gastritis.   . FEMORAL-TIBIAL BYPASS GRAFT Left 12/22/2018   Procedure: BYPASS GRAFT FEMORAL-TIBIAL ARTERY LEFT LEG USING NONREVERSED LEFT GREAT SAPHENOUS VEIN;  Surgeon: Nada Libman, MD;  Location: [facility] OR;  Service: Vascular;  Laterality: Left;  . LOWER EXTREMITY ANGIOGRAPHY N/A 12/27/2017   Procedure: LOWER EXTREMITY ANGIOGRAPHY;  Surgeon: Nada Libman, MD;  Location: [facility];  Service: Cardiovascular;  Laterality: N/A;  . LOWER  EXTREMITY ANGIOGRAPHY N/A 11/21/2018   Procedure: LOWER EXTREMITY ANGIOGRAPHY;  Surgeon: Nada Libman, MD;  Location: [facility];  Service: Cardiovascular;  Laterality: N/A;  . Elease Hashimoto DILATION N/A 03/25/2015   Procedure: Elease Hashimoto DILATION;  Surgeon: Corbin Ade, MD;  Location: [facility];  Service: Endoscopy;  Laterality: N/A;  . PERIPHERAL VASCULAR INTERVENTION Left 09/11/2019   Procedure: PERIPHERAL VASCULAR INTERVENTION;  Surgeon: Nada Libman, MD;  Location: [facility];  Service: Cardiovascular;  Laterality: Left;  . SKIN CANCER DESTRUCTION    . TUBAL LIGATION     with incidental appendectomy  . VEIN HARVEST Left 12/22/2018   Procedure: Vein Harvest Left Great Saphenous;  Surgeon: Nada Libman, MD;  Location: [facility] OR;  Service: Vascular;  Laterality: Left;    No Known Allergies  Current Outpatient Medications  Medication Sig Dispense Refill  . albuterol (PROVENTIL) (2.5 MG/3ML) 0.083% nebulizer solution Take 2.5 mg by nebulization every 6 (six) hours as needed for wheezing or shortness of breath.     [name] albuterol (VENTOLIN HFA) 108 (90 Base) MCG/ACT inhaler Inhale 2 puffs into the lungs every 6 (six) hours as needed for wheezing or shortness of breath.     [name] aspirin EC 81 MG tablet Take 81 mg by mouth daily.    . clopidogrel (PLAVIX) 75 MG tablet Take 1 tablet (75 mg total) by mouth daily. 30 tablet 11  . ibuprofen (ADVIL) 800 MG tablet Take 800 mg by mouth every 8 (eight) hours as needed (back pain.).     [name] mirtazapine (REMERON) 30 MG tablet Take 1 tablet (30 mg total) by mouth at bedtime. (Patient taking differently: Take 15-30 mg by mouth at bedtime as needed (sleep). ) 90 tablet 1  . naproxen sodium (ALEVE) 220 MG tablet Take 440 mg by mouth 2 (two) times daily as needed (pain.).    [name] oxyCODONE-acetaminophen (PERCOCET) 5-325 MG tablet Take 1 tablet by mouth every 4 (four) hours as needed for severe pain. 20 tablet 0  . OXYGEN Inhale 2 L into the lungs at  bedtime.    . rosuvastatin (CRESTOR) 5 MG tablet Take 1 tablet (5 mg total) by mouth daily. 30 tablet 3  . traMADol (ULTRAM) 50 MG tablet Take 50 mg by mouth 2 (two) times daily.    . TRELEGY ELLIPTA 100-62.5-25 MCG/INH AEPB Inhale 1 puff into the lungs daily.     No current facility-administered medications for this visit.    Family History  Problem Relation Age of Onset  . Lung cancer Sister   . Lung cancer Brother   . Colon cancer Neg Hx     Social History   Socioeconomic History  . Marital status: Divorced    Spouse name: Not on file  . Number of children: Not on file  . Years of education: Not on file  . Highest education level: Not on file  Occupational History  . Not on file  Tobacco Use  . Smoking status: Former Smoker    Packs/day: 0.40  Years: 40.00    Pack years: 16.00    Types: Cigarettes  . Smokeless tobacco: Never Used  . Tobacco comment: 5 a day  Vaping Use  . Vaping Use: Never used  Substance and Sexual Activity  . Alcohol use: No  . Drug use: No  . Sexual activity: Not on file  Other Topics Concern  . Not on file  Social History Narrative  . Not on file   Social Determinants of Health   Financial Resource Strain:   . Difficulty of Paying Living Expenses:   Food Insecurity:   . Worried About Charity fundraiser in the Last Year:   . Arboriculturist in the Last Year:   Transportation Needs:   . Film/video editor (Medical):   [name] Lack of Transportation (Non-Medical):   Physical Activity:   . Days of Exercise per Week:   . Minutes of Exercise per Session:   Stress:   . Feeling of Stress :   Social Connections:   . Frequency of Communication with Friends and Family:   . Frequency of Social Gatherings with Friends and Family:   . Attends Religious Services:   . Active Member of Clubs or Organizations:   . Attends Archivist Meetings:   [name] Marital Status:   Intimate Partner Violence:   . Fear of Current or Ex-Partner:   .  Emotionally Abused:   [name] Physically Abused:   . Sexually Abused:      REVIEW OF SYSTEMS:   [X]  denotes positive finding, [ ]  denotes negative finding Cardiac  Comments:  Chest pain or chest pressure:    Shortness of breath upon exertion:    Short of breath when lying flat:    Irregular heart rhythm:        Vascular    Pain in calf, thigh, or hip brought on by ambulation: x   Pain in feet at night that wakes you up from your sleep:     Blood clot in your veins:    Leg swelling:         Pulmonary    Oxygen at home:    Productive cough:  x  loose cough  Wheezing:         Neurologic    Sudden weakness in arms or legs:     Sudden numbness in arms or legs:     Sudden onset of difficulty speaking or slurred speech:    Temporary loss of vision in one eye:     Problems with dizziness:         Gastrointestinal    Blood in stool:     Vomited blood:         Genitourinary    Burning when urinating:     Blood in urine:        Psychiatric    Major depression:         Hematologic    Bleeding problems:    Problems with blood clotting too easily:        Skin    Rashes or ulcers:        Constitutional    Fever or chills:      PHYSICAL EXAMINATION:  Vitals:   10/22/19 1038  BP: (!) 147/72  Pulse: (!) 55  Resp: 20  Temp: 97.9 F (36.6 C)  SpO2: 99%    General:  WDWN in NAD; vital signs documented above Gait: Not observed HENT: WNL, normocephalic Pulmonary: normal non-labored breathing ,  without wheezing Cardiac: regular HR, without  Murmur;  Abdomen: soft, NT Skin: without rashes Vascular Exam/Pulses: Both feet are warm without ulceration. I am unable to palpate her right femoral pulse. 2+ left femoral pulse Extremities: without ischemic changes, without Gangrene , without cellulitis; without open wounds;  Musculoskeletal: no muscle wasting or atrophy  Neurologic: A&O X 3;  No focal weakness or paresthesias are detected Psychiatric:  The pt has Normal  affect.  Non-Invasive Vascular Imaging:   ABI's/TBI's on 10/22/2019: Right:  0.26 - Great toe pressure: 0 Left:  0.69 - Great toe pressure: 63  monophasic waveforms  Arterial duplex on 10/22/2019: Summary: Patent left external iliac stent with inflow artery stenosis of >50%.  Summary:  Right: Near to total occlusion noted in the mid to distal superficial  femoral artery reconstituted via collaterals at the proximal popliteal  artery. Minimal flow within the posterior tibial artery and dampend flow  noted within the peroneal artery.   Left: Patent Left Femoral Arterty to Tibio-Peroneal Trunk Bypass Graft  without evidence of in-graft stenosis. The Tibio-Peroneal Trunk appears  occluded just distal to the bypass graft distal anastamosis. Collaterals  are noted within the area of the  distal anastamosis.    Previous ABI's/TBI's on 08/27/2019: Right:  0.51/0.27 - Great toe pressure: 49 Left:  0.56/0  Previous arterial duplex on 08/27/2019: Summary:  Left: Femoral to tibioperoneal graft is patent with thrombus and lumen  reduction identified involving the proximal anastomosis. The adjacent  native outflow artery is not visualized.   ASSESSMENT/PLAN:: 69 y.o. female here for follow up for left femoral to TP trunk angioplasty and left EIA stenting.  She has improved symptoms on the left.  She has worsening RLE fatigue and unable to carry out her normal activities.  Known right SFA occlusion with drop in ABI and right TP = 0.  I reviewed her non-invasive studies with Dr. Myra Gianotti this morning. He recommends proceeding with arteriogram tomorrow and the patient is in agreement with this.   Milinda Antis, PA-C [facility] [phone]  Clinic MD:   Myra Gianotti

## 2019-10-22 NOTE — H&P (View-Only) (Signed)
HISTORY AND PHYSICAL     CC:  follow up. Requesting Provider:  Avon Gully, MD  HPI: This is a 69 y.o. female who is here today for follow up for angioplasty of the left tibioperoneal trunk and peroneal artery and stent of the left EIA on 09/11/2019 by Dr. Myra Gianotti.   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  I was able to cross the occlusion in the tibioperoneal trunk and treat this with balloon angioplasty extending into the peroneal artery using a 3 mm balloon which resulted then returned patency of the outflow tract.             #3  Right SFA occlusion   Right Lower Extremity: The right common femoral and profundofemoral artery are patent throughout the course.  The superficial femoral artery is occluded just beyond its origin.  There is reconstitution of the popliteal artery above the knee with single-vessel runoff via the peroneal artery.   She has hx of left femoral to tibioperoneal trunk bypass graft with non-reversed ipsilateral translocated saphenous vein and concomitant left iliofemoral endarterectomy and popliteal endarterectomyon 12-22-2018. The vein was marginal but it was usedgiventhe tibial anastomosis. Her bypass was done for rest pain.  At her visit in April, she was starting to have pain in both legs.    The pt returns today for follow up. She is complaining of right lower extremity fatigue within a few yards of walking. Cannot vacuum clean house due to this.  Wakes up at night with cramps in legs and must "walk it out".  The pt is on a statin for cholesterol management.    The pt is on an aspirin.    Other AC:  Plavix The pt is not on meds for hypertension.  The pt does not have  diabetes. Tobacco hx:  former  Past Medical History:  Diagnosis Date  . Anxiety   . Chronic lower back pain   . COPD (chronic obstructive pulmonary disease) (HCC)   . Depression   . GERD (gastroesophageal reflux disease)   . Obstructive sleep apnea    CPAP    Past Surgical History:  Procedure Laterality Date  . ABDOMINAL AORTOGRAM W/LOWER EXTREMITY N/A 09/11/2019   Procedure: ABDOMINAL AORTOGRAM W/LOWER EXTREMITY;  Surgeon: Nada Libman, MD;  Location: [facility];  Service: Cardiovascular;  Laterality: N/A;  . ABDOMINAL HYSTERECTOMY    . CATARACT EXTRACTION    . COLONOSCOPY  01/20/2005   YBO:FBPZWCHEN colon polyp/Diminutive polyps at 30 cm ablated/Normal rectum. hyperplastic polyp  . COLONOSCOPY N/A 03/25/2015   IDP:OEUMPN /left-sided diverticula  . ENDARTERECTOMY FEMORAL Left 12/22/2018   Procedure: Endarterectomy Left Femoral and Politeal Arteries;  Surgeon: Nada Libman, MD;  [facility] OR;  Service: Vascular;  Laterality: Left;  . ESOPHAGOGASTRODUODENOSCOPY  11/30/2004   RMR:A small patch of salmon-colored epithelium (salmon-like lesion in the distal esophagus)/Antral pre-pyloric erosions of uncertain significance, otherwise, normal. solitary duodenal bulbar AVM.No Barrett's.  . ESOPHAGOGASTRODUODENOSCOPY N/A 03/25/2015   TIR:WERXVQM reflux/schatzkis ring s/p dilation/HH/short segment barrett  . ESOPHAGOGASTRODUODENOSCOPY (EGD) WITH ESOPHAGEAL DILATION N/A 09/22/2012   Rourk: erosive RE, biopsy negative for  Barrett's. Gastritis.   . FEMORAL-TIBIAL BYPASS GRAFT Left 12/22/2018   Procedure: BYPASS GRAFT FEMORAL-TIBIAL ARTERY LEFT LEG USING NONREVERSED LEFT GREAT SAPHENOUS VEIN;  Surgeon: Nada Libman, MD;  Location: [facility] OR;  Service: Vascular;  Laterality: Left;  . LOWER EXTREMITY ANGIOGRAPHY N/A 12/27/2017   Procedure: LOWER EXTREMITY ANGIOGRAPHY;  Surgeon: Nada Libman, MD;  Location: [facility];  Service: Cardiovascular;  Laterality: N/A;  . LOWER  EXTREMITY ANGIOGRAPHY N/A 11/21/2018   Procedure: LOWER EXTREMITY ANGIOGRAPHY;  Surgeon: Nada Libman, MD;  Location: [facility];  Service: Cardiovascular;  Laterality: N/A;  . Elease Hashimoto DILATION N/A 03/25/2015   Procedure: Elease Hashimoto DILATION;  Surgeon: Corbin Ade, MD;  Location: [facility];  Service: Endoscopy;  Laterality: N/A;  . PERIPHERAL VASCULAR INTERVENTION Left 09/11/2019   Procedure: PERIPHERAL VASCULAR INTERVENTION;  Surgeon: Nada Libman, MD;  Location: [facility];  Service: Cardiovascular;  Laterality: Left;  . SKIN CANCER DESTRUCTION    . TUBAL LIGATION     with incidental appendectomy  . VEIN HARVEST Left 12/22/2018   Procedure: Vein Harvest Left Great Saphenous;  Surgeon: Nada Libman, MD;  Location: [facility] OR;  Service: Vascular;  Laterality: Left;    No Known Allergies  Current Outpatient Medications  Medication Sig Dispense Refill  . albuterol (PROVENTIL) (2.5 MG/3ML) 0.083% nebulizer solution Take 2.5 mg by nebulization every 6 (six) hours as needed for wheezing or shortness of breath.     [name] albuterol (VENTOLIN HFA) 108 (90 Base) MCG/ACT inhaler Inhale 2 puffs into the lungs every 6 (six) hours as needed for wheezing or shortness of breath.     [name] aspirin EC 81 MG tablet Take 81 mg by mouth daily.    . clopidogrel (PLAVIX) 75 MG tablet Take 1 tablet (75 mg total) by mouth daily. 30 tablet 11  . ibuprofen (ADVIL) 800 MG tablet Take 800 mg by mouth every 8 (eight) hours as needed (back pain.).     [name] mirtazapine (REMERON) 30 MG tablet Take 1 tablet (30 mg total) by mouth at bedtime. (Patient taking differently: Take 15-30 mg by mouth at bedtime as needed (sleep). ) 90 tablet 1  . naproxen sodium (ALEVE) 220 MG tablet Take 440 mg by mouth 2 (two) times daily as needed (pain.).    [name] oxyCODONE-acetaminophen (PERCOCET) 5-325 MG tablet Take 1 tablet by mouth every 4 (four) hours as needed for severe pain. 20 tablet 0  . OXYGEN Inhale 2 L into the lungs at  bedtime.    . rosuvastatin (CRESTOR) 5 MG tablet Take 1 tablet (5 mg total) by mouth daily. 30 tablet 3  . traMADol (ULTRAM) 50 MG tablet Take 50 mg by mouth 2 (two) times daily.    . TRELEGY ELLIPTA 100-62.5-25 MCG/INH AEPB Inhale 1 puff into the lungs daily.     No current facility-administered medications for this visit.    Family History  Problem Relation Age of Onset  . Lung cancer Sister   . Lung cancer Brother   . Colon cancer Neg Hx     Social History   Socioeconomic History  . Marital status: Divorced    Spouse name: Not on file  . Number of children: Not on file  . Years of education: Not on file  . Highest education level: Not on file  Occupational History  . Not on file  Tobacco Use  . Smoking status: Former Smoker    Packs/day: 0.40  Years: 40.00    Pack years: 16.00    Types: Cigarettes  . Smokeless tobacco: Never Used  . Tobacco comment: 5 a day  Vaping Use  . Vaping Use: Never used  Substance and Sexual Activity  . Alcohol use: No  . Drug use: No  . Sexual activity: Not on file  Other Topics Concern  . Not on file  Social History Narrative  . Not on file   Social Determinants of Health   Financial Resource Strain:   . Difficulty of Paying Living Expenses:   Food Insecurity:   . Worried About Charity fundraiser in the Last Year:   . Arboriculturist in the Last Year:   Transportation Needs:   . Film/video editor (Medical):   [name] Lack of Transportation (Non-Medical):   Physical Activity:   . Days of Exercise per Week:   . Minutes of Exercise per Session:   Stress:   . Feeling of Stress :   Social Connections:   . Frequency of Communication with Friends and Family:   . Frequency of Social Gatherings with Friends and Family:   . Attends Religious Services:   . Active Member of Clubs or Organizations:   . Attends Archivist Meetings:   [name] Marital Status:   Intimate Partner Violence:   . Fear of Current or Ex-Partner:   .  Emotionally Abused:   [name] Physically Abused:   . Sexually Abused:      REVIEW OF SYSTEMS:   [X]  denotes positive finding, [ ]  denotes negative finding Cardiac  Comments:  Chest pain or chest pressure:    Shortness of breath upon exertion:    Short of breath when lying flat:    Irregular heart rhythm:        Vascular    Pain in calf, thigh, or hip brought on by ambulation: x   Pain in feet at night that wakes you up from your sleep:     Blood clot in your veins:    Leg swelling:         Pulmonary    Oxygen at home:    Productive cough:  x  loose cough  Wheezing:         Neurologic    Sudden weakness in arms or legs:     Sudden numbness in arms or legs:     Sudden onset of difficulty speaking or slurred speech:    Temporary loss of vision in one eye:     Problems with dizziness:         Gastrointestinal    Blood in stool:     Vomited blood:         Genitourinary    Burning when urinating:     Blood in urine:        Psychiatric    Major depression:         Hematologic    Bleeding problems:    Problems with blood clotting too easily:        Skin    Rashes or ulcers:        Constitutional    Fever or chills:      PHYSICAL EXAMINATION:  Vitals:   10/22/19 1038  BP: (!) 147/72  Pulse: (!) 55  Resp: 20  Temp: 97.9 F (36.6 C)  SpO2: 99%    General:  WDWN in NAD; vital signs documented above Gait: Not observed HENT: WNL, normocephalic Pulmonary: normal non-labored breathing ,  without wheezing Cardiac: regular HR, without  Murmur;  Abdomen: soft, NT Skin: without rashes Vascular Exam/Pulses: Both feet are warm without ulceration. I am unable to palpate her right femoral pulse. 2+ left femoral pulse Extremities: without ischemic changes, without Gangrene , without cellulitis; without open wounds;  Musculoskeletal: no muscle wasting or atrophy  Neurologic: A&O X 3;  No focal weakness or paresthesias are detected Psychiatric:  The pt has Normal  affect.  Non-Invasive Vascular Imaging:   ABI's/TBI's on 10/22/2019: Right:  0.26 - Great toe pressure: 0 Left:  0.69 - Great toe pressure: 63  monophasic waveforms  Arterial duplex on 10/22/2019: Summary: Patent left external iliac stent with inflow artery stenosis of >50%.  Summary:  Right: Near to total occlusion noted in the mid to distal superficial  femoral artery reconstituted via collaterals at the proximal popliteal  artery. Minimal flow within the posterior tibial artery and dampend flow  noted within the peroneal artery.   Left: Patent Left Femoral Arterty to Tibio-Peroneal Trunk Bypass Graft  without evidence of in-graft stenosis. The Tibio-Peroneal Trunk appears  occluded just distal to the bypass graft distal anastamosis. Collaterals  are noted within the area of the  distal anastamosis.    Previous ABI's/TBI's on 08/27/2019: Right:  0.51/0.27 - Great toe pressure: 49 Left:  0.56/0  Previous arterial duplex on 08/27/2019: Summary:  Left: Femoral to tibioperoneal graft is patent with thrombus and lumen  reduction identified involving the proximal anastomosis. The adjacent  native outflow artery is not visualized.   ASSESSMENT/PLAN:: 68 y.o. female here for follow up for left femoral to TP trunk angioplasty and left EIA stenting.  She has improved symptoms on the left.  She has worsening RLE fatigue and unable to carry out her normal activities.  Known right SFA occlusion with drop in ABI and right TP = 0.  I reviewed her non-invasive studies with Dr. Brabham this morning. He recommends proceeding with arteriogram tomorrow and the patient is in agreement with this.   Irena Gaydos J Belisa Eichholz, PA-C [facility] [phone]  Clinic MD:   Brabham  

## 2019-10-23 ENCOUNTER — Other Ambulatory Visit: Payer: Self-pay

## 2019-10-23 ENCOUNTER — Encounter (HOSPITAL_COMMUNITY)
Admission: RE | Disposition: A | Discharge status: Home / Self Care | Payer: Self-pay | Source: Home / Self Care | Attending: Surgery

## 2019-10-23 ENCOUNTER — Ambulatory Visit (HOSPITAL_COMMUNITY)
Admission: RE | Admit: 2019-10-23 | Discharge: 2019-10-23 | Disposition: A | Discharge status: Home / Self Care | Payer: Medicare HMO | Attending: Surgery | Admitting: Surgery

## 2019-10-23 DIAGNOSIS — F419 Anxiety disorder, unspecified: Secondary | ICD-10-CM | POA: Insufficient documentation

## 2019-10-23 DIAGNOSIS — I70221 Atherosclerosis of native arteries of extremities with rest pain, right leg: Secondary | ICD-10-CM | POA: Diagnosis not present

## 2019-10-23 DIAGNOSIS — G4733 Obstructive sleep apnea (adult) (pediatric): Secondary | ICD-10-CM | POA: Insufficient documentation

## 2019-10-23 DIAGNOSIS — Z7902 Long term (current) use of antithrombotics/antiplatelets: Secondary | ICD-10-CM | POA: Insufficient documentation

## 2019-10-23 DIAGNOSIS — K219 Gastro-esophageal reflux disease without esophagitis: Secondary | ICD-10-CM | POA: Diagnosis not present

## 2019-10-23 DIAGNOSIS — J449 Chronic obstructive pulmonary disease, unspecified: Secondary | ICD-10-CM | POA: Diagnosis not present

## 2019-10-23 DIAGNOSIS — Z87891 Personal history of nicotine dependence: Secondary | ICD-10-CM | POA: Diagnosis not present

## 2019-10-23 DIAGNOSIS — I10 Essential (primary) hypertension: Secondary | ICD-10-CM | POA: Diagnosis not present

## 2019-10-23 DIAGNOSIS — Z7982 Long term (current) use of aspirin: Secondary | ICD-10-CM | POA: Insufficient documentation

## 2019-10-23 DIAGNOSIS — F329 Major depressive disorder, single episode, unspecified: Secondary | ICD-10-CM | POA: Insufficient documentation

## 2019-10-23 DIAGNOSIS — Z79899 Other long term (current) drug therapy: Secondary | ICD-10-CM | POA: Insufficient documentation

## 2019-10-23 HISTORY — PX: ABDOMINAL AORTOGRAM W/LOWER EXTREMITY: CATH118223

## 2019-10-23 HISTORY — PX: PERIPHERAL VASCULAR INTERVENTION: CATH118257

## 2019-10-23 LAB — POCT I-STAT, CHEM 8
BUN: 15 mg/dL (ref 8–23)
Calcium, Ion: 1.18 mmol/L (ref 1.15–1.40)
Chloride: 107 mmol/L (ref 98–111)
Creatinine, Ser: 0.6 mg/dL (ref 0.44–1.00)
Glucose, Bld: 96 mg/dL (ref 70–99)
HCT: 42 % (ref 36.0–46.0)
Hemoglobin: 14.3 g/dL (ref 12.0–15.0)
Potassium: 4.7 mmol/L (ref 3.5–5.1)
Sodium: 141 mmol/L (ref 135–145)
TCO2: 27 mmol/L (ref 22–32)

## 2019-10-23 LAB — POCT ACTIVATED CLOTTING TIME
Activated Clotting Time: 191 seconds
Activated Clotting Time: 158 seconds

## 2019-10-23 SURGERY — ABDOMINAL AORTOGRAM W/LOWER EXTREMITY
Anesthesia: LOCAL | Laterality: Right

## 2019-10-23 MED ORDER — OXYCODONE-ACETAMINOPHEN 5-325 MG PO TABS
ORAL_TABLET | ORAL | Status: AC
  Filled 2019-10-23: qty 2

## 2019-10-23 MED ORDER — SODIUM CHLORIDE 0.9 % IV SOLN: 250.0000 mL | INTRAVENOUS | Status: DC | PRN

## 2019-10-23 MED ORDER — OXYCODONE-ACETAMINOPHEN 5-325 MG PO TABS
2.0000 | ORAL_TABLET | Freq: Four times a day (QID) | ORAL | Status: DC | PRN
  Administered 2019-10-23: 2 via ORAL

## 2019-10-23 MED ORDER — LIDOCAINE HCL (PF) 1 % IJ SOLN
INTRAMUSCULAR | Status: AC
  Filled 2019-10-23: qty 30

## 2019-10-23 MED ORDER — HEPARIN SODIUM (PORCINE) 1000 UNIT/ML IJ SOLN
INTRAMUSCULAR | Status: AC
  Filled 2019-10-23: qty 1

## 2019-10-23 MED ORDER — LABETALOL HCL 5 MG/ML IV SOLN: 10.0000 mg | INTRAVENOUS | Status: DC | PRN

## 2019-10-23 MED ORDER — SODIUM CHLORIDE 0.9 % WEIGHT BASED INFUSION: 1.0000 mL/kg/h | INTRAVENOUS | Status: DC

## 2019-10-23 MED ORDER — MIDAZOLAM HCL 2 MG/2ML IJ SOLN
INTRAMUSCULAR | Status: DC | PRN
  Administered 2019-10-23 (×3): 1 mg via INTRAVENOUS

## 2019-10-23 MED ORDER — HEPARIN (PORCINE) IN NACL 1000-0.9 UT/500ML-% IV SOLN
INTRAVENOUS | Status: AC
  Filled 2019-10-23: qty 1000

## 2019-10-23 MED ORDER — SODIUM CHLORIDE 0.9% FLUSH: 3.0000 mL | INTRAVENOUS | Status: DC | PRN

## 2019-10-23 MED ORDER — MIDAZOLAM HCL 2 MG/2ML IJ SOLN
INTRAMUSCULAR | Status: AC
  Filled 2019-10-23: qty 2

## 2019-10-23 MED ORDER — ONDANSETRON HCL 4 MG/2ML IJ SOLN
4.0000 mg | Freq: Four times a day (QID) | INTRAMUSCULAR | Status: DC | PRN
  Administered 2019-10-23: 4 mg via INTRAVENOUS

## 2019-10-23 MED ORDER — LIDOCAINE HCL (PF) 1 % IJ SOLN
INTRAMUSCULAR | Status: DC | PRN
  Administered 2019-10-23: 18 mL

## 2019-10-23 MED ORDER — ACETAMINOPHEN 325 MG PO TABS: 650.0000 mg | ORAL_TABLET | ORAL | Status: DC | PRN

## 2019-10-23 MED ORDER — HEPARIN (PORCINE) IN NACL 1000-0.9 UT/500ML-% IV SOLN
INTRAVENOUS | Status: DC | PRN
  Administered 2019-10-23 (×2): 500 mL

## 2019-10-23 MED ORDER — SODIUM CHLORIDE 0.9% FLUSH: 3.0000 mL | Freq: Two times a day (BID) | INTRAVENOUS | Status: DC

## 2019-10-23 MED ORDER — OXYCODONE-ACETAMINOPHEN 5-325 MG PO TABS: 1.0000 | ORAL_TABLET | ORAL | 0 refills | Status: AC | PRN

## 2019-10-23 MED ORDER — MORPHINE SULFATE (PF) 2 MG/ML IV SOLN
2.0000 mg | Freq: Once | INTRAVENOUS | Status: AC
  Administered 2019-10-23: 2 mg via INTRAVENOUS

## 2019-10-23 MED ORDER — FENTANYL CITRATE (PF) 100 MCG/2ML IJ SOLN
INTRAMUSCULAR | Status: AC
  Filled 2019-10-23: qty 2

## 2019-10-23 MED ORDER — SODIUM CHLORIDE 0.9 % IV SOLN: INTRAVENOUS | Status: DC

## 2019-10-23 MED ORDER — HYDRALAZINE HCL 20 MG/ML IJ SOLN
INTRAMUSCULAR | Status: AC
  Filled 2019-10-23: qty 1

## 2019-10-23 MED ORDER — HYDRALAZINE HCL 20 MG/ML IJ SOLN
5.0000 mg | INTRAMUSCULAR | Status: AC | PRN
  Administered 2019-10-23: 5 mg via INTRAVENOUS
  Administered 2019-10-23: 20 mg via INTRAVENOUS

## 2019-10-23 MED ORDER — FENTANYL CITRATE (PF) 100 MCG/2ML IJ SOLN
INTRAMUSCULAR | Status: DC | PRN
  Administered 2019-10-23 (×3): 50 ug via INTRAVENOUS

## 2019-10-23 MED ORDER — OXYCODONE HCL 5 MG PO TABS: 10.0000 mg | ORAL_TABLET | Freq: Once | ORAL | Status: DC

## 2019-10-23 MED ORDER — IODIXANOL 320 MG/ML IV SOLN
INTRAVENOUS | Status: DC | PRN
  Administered 2019-10-23: 175 mL via INTRA_ARTERIAL

## 2019-10-23 MED ORDER — ONDANSETRON HCL 4 MG/2ML IJ SOLN
INTRAMUSCULAR | Status: AC
  Filled 2019-10-23: qty 2

## 2019-10-23 MED ORDER — MORPHINE SULFATE (PF) 4 MG/ML IV SOLN
2.0000 mg | INTRAVENOUS | Status: DC | PRN
  Administered 2019-10-23: 2 mg via INTRAVENOUS
  Filled 2019-10-23: qty 1

## 2019-10-23 MED ORDER — MORPHINE SULFATE (PF) 2 MG/ML IV SOLN
INTRAVENOUS | Status: AC
  Filled 2019-10-23: qty 1

## 2019-10-23 MED ORDER — HEPARIN SODIUM (PORCINE) 1000 UNIT/ML IJ SOLN
INTRAMUSCULAR | Status: DC | PRN
  Administered 2019-10-23: 5000 [IU] via INTRAVENOUS
  Administered 2019-10-23: 2000 [IU] via INTRAVENOUS

## 2019-10-23 SURGICAL SUPPLY — 24 items
BALLN MUSTANG 7X80X135 (BALLOONS) ×3
BALLOON MUSTANG 7X80X135 (BALLOONS) ×2 IMPLANT
CANISTER PENUMBRA ENGINE (MISCELLANEOUS) ×3 IMPLANT
CATH ANGIO 5F BER2 65CM (CATHETERS) ×3 IMPLANT
CATH INDIGO CAT6 KIT (CATHETERS) ×3 IMPLANT
CATH OMNI FLUSH 5F 65CM (CATHETERS) ×3 IMPLANT
CATH QUICKCROSS SUPP .035X90CM (MICROCATHETER) ×3 IMPLANT
DEVICE CONTINUOUS FLUSH (MISCELLANEOUS) ×3 IMPLANT
DEVICE TORQUE H2O (MISCELLANEOUS) ×3 IMPLANT
GUIDEWIRE ANGLED .035X150CM (WIRE) ×3 IMPLANT
KIT ENCORE 26 ADVANTAGE (KITS) ×3 IMPLANT
KIT MICROPUNCTURE NIT STIFF (SHEATH) ×3 IMPLANT
KIT PV (KITS) ×3 IMPLANT
SHEATH PINNACLE 5F 10CM (SHEATH) ×3 IMPLANT
SHEATH PINNACLE ST 6F 45CM (SHEATH) ×3 IMPLANT
SHEATH PROBE COVER 6X72 (BAG) ×3 IMPLANT
STENT ELUVIA 7X40X130 (Permanent Stent) ×3 IMPLANT
STENT ELUVIA 7X60X130 (Permanent Stent) ×6 IMPLANT
STENT EXPRESS LD 8X37X75 (Permanent Stent) ×3 IMPLANT
SYR MEDRAD MARK V 150ML (SYRINGE) ×3 IMPLANT
TRANSDUCER W/STOPCOCK (MISCELLANEOUS) ×3 IMPLANT
TRAY PV CATH (CUSTOM PROCEDURE TRAY) ×3 IMPLANT
WIRE HI TORQ VERSACORE J 260CM (WIRE) ×3 IMPLANT
WIRE HITORQ VERSACORE ST 145CM (WIRE) ×3 IMPLANT

## 2019-10-23 NOTE — Interval H&P Note (Signed)
History and Physical Interval Note:  10/23/2019 11:42 AM  Lauren King  has presented today for surgery, with the diagnosis of atherosclerosis rest pain lower extremity.  The various methods of treatment have been discussed with the patient and family. After consideration of risks, benefits and other options for treatment, the patient has consented to  Procedure(s): ABDOMINAL AORTOGRAM W/LOWER EXTREMITY (N/A) as a surgical intervention.  The patient's history has been reviewed, patient examined, no change in status, stable for surgery.  I have reviewed the patient's chart and labs.  Questions were answered to the patient's satisfaction.     [name]

## 2019-10-23 NOTE — Progress Notes (Signed)
Patient and family was given discharge instructions. Both verbalized understanding. 

## 2019-10-23 NOTE — Progress Notes (Signed)
Sheath removed at 17:00, manual pressure held for 15 minutes. Site level 0 with tegaderm and gauze dressing. Bedrest to begin at 17:15

## 2019-10-23 NOTE — Discharge Instructions (Signed)
Femoral Site Care This sheet gives you information about how to care for yourself after your procedure. Your health care provider may also give you more specific instructions. If you have problems or questions, contact your health care provider. What can I expect after the procedure? After the procedure, it is common to have:  Bruising that usually fades within 1-2 weeks.  Tenderness at the site. Follow these instructions at home: Wound care  Follow instructions from your health care provider about how to take care of your insertion site. Make sure you: ? Wash your hands with soap and water before you change your bandage (dressing). If soap and water are not available, use hand sanitizer. ? Change your dressing as told by your health care provider. ? Leave stitches (sutures), skin glue, or adhesive strips in place. These skin closures may need to stay in place for 2 weeks or longer. If adhesive strip edges start to loosen and curl up, you may trim the loose edges. Do not remove adhesive strips completely unless your health care provider tells you to do that.  Do not take baths, swim, or use a hot tub until your health care provider approves.  You may shower 24-48 hours after the procedure or as told by your health care provider. ? Gently wash the site with plain soap and water. ? Pat the area dry with a clean towel. ? Do not rub the site. This may cause bleeding.  Do not apply powder or lotion to the site. Keep the site clean and dry.  Check your femoral site every day for signs of infection. Check for: ? Redness, swelling, or pain. ? Fluid or blood. ? Warmth. ? Pus or a bad smell. Activity  For the first 2-3 days after your procedure, or as long as directed: ? Avoid climbing stairs as much as possible. ? Do not squat.  Do not lift anything that is heavier than 10 lb (4.5 kg), or the limit that you are told, until your health care provider says that it is safe.  Rest as  directed. ? Avoid sitting for a long time without moving. Get up to take short walks every 1-2 hours.  Do not drive for 24 hours if you were given a medicine to help you relax (sedative). General instructions  Take over-the-counter and prescription medicines only as told by your health care provider.  Keep all follow-up visits as told by your health care provider. This is important. Contact a health care provider if you have:  A fever or chills.  You have redness, swelling, or pain around your insertion site. Get help right away if:  The catheter insertion area swells very fast.  You pass out.  You suddenly start to sweat or your skin gets clammy.  The catheter insertion area is bleeding, and the bleeding does not stop when you hold steady pressure on the area.  The area near or just beyond the catheter insertion site becomes pale, cool, tingly, or numb. These symptoms may represent a serious problem that is an emergency. Do not wait to see if the symptoms will go away. Get medical help right away. Call your local emergency services (911 in the [place] Do not drive yourself to the hospital. Summary  After the procedure, it is common to have bruising that usually fades within 1-2 weeks.  Check your femoral site every day for signs of infection.  Do not lift anything that is heavier than 10 lb (4.5 kg), or the   limit that you are told, until your health care provider says that it is safe. This information is not intended to replace advice given to you by your health care provider. Make sure you discuss any questions you have with your health care provider. Document Revised: 05/09/2017 Document Reviewed: 05/09/2017 Elsevier Patient Education  2020 [employer]  

## 2019-10-23 NOTE — Op Note (Signed)
Patient name: Lauren King MRN: [id number] DOB: 1950-09-14 Sex: female  10/23/2019 Pre-operative Diagnosis: Right leg rest pain Post-operative diagnosis:  Same Surgeon:  [name] Procedure Performed:  1.  Ultrasound-guided access, left femoral artery  2.  Abdominal aortogram  3.  Mechanical thrombectomy right external iliac and common femoral artery  4.  Stent, right common femoral, external iliac, and common iliac artery  5.  Conscious sedation 93 minutes     Indications: The patient recently underwent angioplasty of a left femoral-popliteal bypass graft.  Several days after the procedure she began having pain in her right leg which was the site of access.  She comes in today for angiographic evaluation.  Procedure:  The patient was identified in the holding area and taken to room 8.  The patient was then placed supine on the table and prepped and draped in the usual sterile fashion.  A time out was called.  Conscious sedation was administered with the use of IV fentanyl and Versed under continuous physician and nurse monitoring.  Heart rate, blood pressure, and oxygen saturation were continuously monitored.  Total sedation time was 93 minutes.  Ultrasound was used to evaluate the left common femoral artery.  It was patent .  A digital ultrasound image was acquired.  A micropuncture needle was used to access the left common femoral artery under ultrasound guidance.  An 018 wire was advanced without resistance and a micropuncture sheath was placed.  The 018 wire was removed and a benson wire was placed.  The micropuncture sheath was exchanged for a 5 french sheath.  An omniflush catheter was advanced over the wire to the level of L-1.  An abdominal angiogram was obtained.   Findings:   Aortogram: No significant renal artery stenosis.  The infrarenal abdominal aorta is heavily calcified and small in caliber but patent without stenosis.  The left common iliac artery is calcified but  patent.  There is significant angulation in the common to external iliac transition.  The stent within the left external iliac artery is widely patent.  There is approximately 40% stenosis right common iliac artery.  The right external iliac artery is occluded with reconstitution of the common femoral artery in the mid femoral head.  The right superficial femoral artery is known to be occluded  Intervention: After the above images were acquired the decision was made to proceed with intervention.  A 6 French 45 cm sheath was inserted.  The patient was fully heparinized.  A Omni Flush catheter and a Glidewire, the aortic bifurcation was crossed.  A quick cross catheter was then used to perform crossing of the occlusion in the right common femoral and external iliac artery.  Catheter reentered in the upper third of the femoral head which was confirmed by contrast injection.  Next a versa core wire was placed.  I then inserted a CAT 6 mechanical thrombectomy device and used this through the external iliac and common femoral artery.  After 1 pass an arteriogram was performed which showed inline flow through the artery which remained very diseased.  Therefore I felt it needed to be stented.  Unfortunately the disease extended into the proximal common femoral artery and so a 7 x 60 Elluvia stent was deployed going across the femoral head.  A second stent was then required to treat the whole occluded segment up to the internal iliac artery.  This was postdilated with a 6 mm balloon.  Completion imaging showed excellent result however there  was luminal irregularity at the top of the stent.  I elected to cover this with a 7 x 40 Elluvia stent which was postdilated with a 6 mm balloon.  The patient also had a new dissection in the common iliac artery.  I stented this using a 8 x 37 Omnilink balloon expandable stent.  Follow-up imaging showed significant improvement in blood flow to the right iliac system.  There was a  nonflow limiting residual dissection above the stent which I elected to leave alone.  I checked a pressure gradient between the left common and external iliac artery and not existed and so I elected not to treat anything on the left iliac side.  Patient will be taken the holding area for sheath pull once her coagulation progress.  Impression:  #1  Occluded right external and common femoral artery successfully crossed.  Mechanical thrombectomy using a CAT 6 device was performed and then subsequent stenting using overlapping 7 mm Elluvia stents.  A dissection up in the proximal iliac artery was treated with a balloon expandable stent.  #2  No significant pressure gradient in the left iliac system.    Theotis Burrow, M.D., [facility] [place] Office: [phone] Pager:  [phone]

## 2019-10-24 ENCOUNTER — Encounter (HOSPITAL_COMMUNITY): Payer: Self-pay | Admitting: Surgery

## 2019-10-24 LAB — POCT ACTIVATED CLOTTING TIME
Activated Clotting Time: 197 seconds
Activated Clotting Time: 224 seconds

## 2019-10-30 DIAGNOSIS — M85851 Other specified disorders of bone density and structure, right thigh: Secondary | ICD-10-CM | POA: Diagnosis not present

## 2019-10-30 DIAGNOSIS — N959 Unspecified menopausal and perimenopausal disorder: Secondary | ICD-10-CM | POA: Diagnosis not present

## 2019-11-01 ENCOUNTER — Other Ambulatory Visit: Payer: Self-pay | Admitting: Physician Assistant

## 2019-11-07 DIAGNOSIS — J449 Chronic obstructive pulmonary disease, unspecified: Secondary | ICD-10-CM | POA: Diagnosis not present

## 2019-11-18 DIAGNOSIS — J449 Chronic obstructive pulmonary disease, unspecified: Secondary | ICD-10-CM | POA: Diagnosis not present

## 2019-11-18 DIAGNOSIS — M1990 Unspecified osteoarthritis, unspecified site: Secondary | ICD-10-CM | POA: Diagnosis not present

## 2019-12-07 DIAGNOSIS — J449 Chronic obstructive pulmonary disease, unspecified: Secondary | ICD-10-CM | POA: Diagnosis not present

## 2019-12-19 DIAGNOSIS — J449 Chronic obstructive pulmonary disease, unspecified: Secondary | ICD-10-CM | POA: Diagnosis not present

## 2019-12-19 DIAGNOSIS — M1990 Unspecified osteoarthritis, unspecified site: Secondary | ICD-10-CM | POA: Diagnosis not present

## 2019-12-21 ENCOUNTER — Other Ambulatory Visit: Payer: Self-pay

## 2019-12-21 MED ORDER — CLOPIDOGREL BISULFATE 75 MG PO TABS: 75.0000 mg | ORAL_TABLET | Freq: Every day | ORAL | Status: DC

## 2020-01-07 DIAGNOSIS — J449 Chronic obstructive pulmonary disease, unspecified: Secondary | ICD-10-CM | POA: Diagnosis not present

## 2020-01-11 DIAGNOSIS — M129 Arthropathy, unspecified: Secondary | ICD-10-CM | POA: Diagnosis not present

## 2020-01-11 DIAGNOSIS — E785 Hyperlipidemia, unspecified: Secondary | ICD-10-CM | POA: Diagnosis not present

## 2020-01-11 DIAGNOSIS — J449 Chronic obstructive pulmonary disease, unspecified: Secondary | ICD-10-CM | POA: Diagnosis not present

## 2020-01-11 DIAGNOSIS — F339 Major depressive disorder, recurrent, unspecified: Secondary | ICD-10-CM | POA: Diagnosis not present

## 2020-02-04 DIAGNOSIS — Z7902 Long term (current) use of antithrombotics/antiplatelets: Secondary | ICD-10-CM | POA: Diagnosis not present

## 2020-02-04 DIAGNOSIS — Z681 Body mass index (BMI) 19 or less, adult: Secondary | ICD-10-CM | POA: Diagnosis not present

## 2020-02-04 DIAGNOSIS — I70203 Unspecified atherosclerosis of native arteries of extremities, bilateral legs: Secondary | ICD-10-CM | POA: Diagnosis not present

## 2020-02-04 DIAGNOSIS — Z95820 Peripheral vascular angioplasty status with implants and grafts: Secondary | ICD-10-CM | POA: Diagnosis not present

## 2020-02-04 DIAGNOSIS — Z87891 Personal history of nicotine dependence: Secondary | ICD-10-CM | POA: Diagnosis not present

## 2020-02-04 DIAGNOSIS — D692 Other nonthrombocytopenic purpura: Secondary | ICD-10-CM | POA: Diagnosis not present

## 2020-02-04 DIAGNOSIS — J449 Chronic obstructive pulmonary disease, unspecified: Secondary | ICD-10-CM | POA: Diagnosis not present

## 2020-02-04 DIAGNOSIS — I10 Essential (primary) hypertension: Secondary | ICD-10-CM | POA: Diagnosis not present

## 2020-02-07 DIAGNOSIS — J449 Chronic obstructive pulmonary disease, unspecified: Secondary | ICD-10-CM | POA: Diagnosis not present

## 2020-02-10 DIAGNOSIS — M1990 Unspecified osteoarthritis, unspecified site: Secondary | ICD-10-CM | POA: Diagnosis not present

## 2020-02-10 DIAGNOSIS — J449 Chronic obstructive pulmonary disease, unspecified: Secondary | ICD-10-CM | POA: Diagnosis not present

## 2020-03-08 DIAGNOSIS — J449 Chronic obstructive pulmonary disease, unspecified: Secondary | ICD-10-CM | POA: Diagnosis not present

## 2020-03-12 DIAGNOSIS — E785 Hyperlipidemia, unspecified: Secondary | ICD-10-CM | POA: Diagnosis not present

## 2020-03-12 DIAGNOSIS — M1990 Unspecified osteoarthritis, unspecified site: Secondary | ICD-10-CM | POA: Diagnosis not present

## 2020-04-08 DIAGNOSIS — J449 Chronic obstructive pulmonary disease, unspecified: Secondary | ICD-10-CM | POA: Diagnosis not present

## 2020-04-10 ENCOUNTER — Ambulatory Visit (HOSPITAL_COMMUNITY)
Admission: RE | Admit: 2020-04-10 | Discharge: 2020-04-10 | Disposition: A | Discharge status: Home / Self Care | Payer: Medicare HMO | Source: Ambulatory Visit | Attending: Acute Care | Admitting: Acute Care

## 2020-04-10 ENCOUNTER — Other Ambulatory Visit: Payer: Self-pay

## 2020-04-10 DIAGNOSIS — Z87891 Personal history of nicotine dependence: Secondary | ICD-10-CM

## 2020-04-10 DIAGNOSIS — F17210 Nicotine dependence, cigarettes, uncomplicated: Secondary | ICD-10-CM | POA: Insufficient documentation

## 2020-04-10 DIAGNOSIS — Z122 Encounter for screening for malignant neoplasm of respiratory organs: Secondary | ICD-10-CM | POA: Insufficient documentation

## 2020-04-11 DIAGNOSIS — E785 Hyperlipidemia, unspecified: Secondary | ICD-10-CM | POA: Diagnosis not present

## 2020-04-11 DIAGNOSIS — J449 Chronic obstructive pulmonary disease, unspecified: Secondary | ICD-10-CM | POA: Diagnosis not present

## 2020-04-15 NOTE — Progress Notes (Signed)
Please call patient and let them  know their  low dose Ct was read as a Lung RADS 2: nodules that are benign in appearance and behavior with a very low likelihood of becoming a clinically active cancer due to size or lack of growth. Recommendation per radiology is for a repeat LDCT in 12 months. .Please let them  know we will order and schedule their  annual screening scan for 04/2021. Please let them  know there was notation of CAD on their  scan.  Please remind the patient  that this is a non-gated exam therefore degree or severity of disease  cannot be determined. Please have them  follow up with their PCP regarding potential risk factor modification, dietary therapy or pharmacologic therapy if clinically indicated. Pt.  is currently  currently on statin therapy. Please place order for annual  screening scan for  04/2021 and fax results to PCP. Thanks so much. Angelique Blonder, this patient is followed by cards

## 2020-04-21 ENCOUNTER — Other Ambulatory Visit: Payer: Self-pay | Admitting: *Deleted

## 2020-04-21 DIAGNOSIS — Z87891 Personal history of nicotine dependence: Secondary | ICD-10-CM

## 2020-04-30 ENCOUNTER — Other Ambulatory Visit: Payer: Self-pay | Admitting: Surgery

## 2020-05-08 DIAGNOSIS — J449 Chronic obstructive pulmonary disease, unspecified: Secondary | ICD-10-CM | POA: Diagnosis not present

## 2020-05-12 DIAGNOSIS — J449 Chronic obstructive pulmonary disease, unspecified: Secondary | ICD-10-CM | POA: Diagnosis not present

## 2020-05-12 DIAGNOSIS — M1990 Unspecified osteoarthritis, unspecified site: Secondary | ICD-10-CM | POA: Diagnosis not present

## 2020-06-02 ENCOUNTER — Other Ambulatory Visit: Payer: Self-pay | Admitting: Surgery

## 2020-06-08 DIAGNOSIS — J449 Chronic obstructive pulmonary disease, unspecified: Secondary | ICD-10-CM | POA: Diagnosis not present

## 2020-06-12 DIAGNOSIS — J449 Chronic obstructive pulmonary disease, unspecified: Secondary | ICD-10-CM | POA: Diagnosis not present

## 2020-06-12 DIAGNOSIS — F17200 Nicotine dependence, unspecified, uncomplicated: Secondary | ICD-10-CM | POA: Diagnosis not present

## 2020-07-07 DIAGNOSIS — J449 Chronic obstructive pulmonary disease, unspecified: Secondary | ICD-10-CM | POA: Diagnosis not present

## 2020-07-14 ENCOUNTER — Other Ambulatory Visit: Payer: Self-pay | Admitting: Surgery

## 2020-07-14 DIAGNOSIS — Z0001 Encounter for general adult medical examination with abnormal findings: Secondary | ICD-10-CM | POA: Diagnosis not present

## 2020-07-14 DIAGNOSIS — M1990 Unspecified osteoarthritis, unspecified site: Secondary | ICD-10-CM | POA: Diagnosis not present

## 2020-07-14 DIAGNOSIS — Z1389 Encounter for screening for other disorder: Secondary | ICD-10-CM | POA: Diagnosis not present

## 2020-07-14 DIAGNOSIS — F339 Major depressive disorder, recurrent, unspecified: Secondary | ICD-10-CM | POA: Diagnosis not present

## 2020-07-14 DIAGNOSIS — Z1331 Encounter for screening for depression: Secondary | ICD-10-CM | POA: Diagnosis not present

## 2020-07-14 DIAGNOSIS — J449 Chronic obstructive pulmonary disease, unspecified: Secondary | ICD-10-CM | POA: Diagnosis not present

## 2020-07-14 NOTE — Telephone Encounter (Signed)
Must make appt for refills 

## 2020-07-15 ENCOUNTER — Other Ambulatory Visit: Payer: Self-pay

## 2020-07-15 MED ORDER — CLOPIDOGREL BISULFATE 75 MG PO TABS: ORAL_TABLET | ORAL | 0 refills | Status: DC

## 2020-08-01 DIAGNOSIS — E46 Unspecified protein-calorie malnutrition: Secondary | ICD-10-CM | POA: Diagnosis not present

## 2020-08-01 DIAGNOSIS — Z681 Body mass index (BMI) 19 or less, adult: Secondary | ICD-10-CM | POA: Diagnosis not present

## 2020-08-06 DIAGNOSIS — J449 Chronic obstructive pulmonary disease, unspecified: Secondary | ICD-10-CM | POA: Diagnosis not present

## 2020-08-14 DIAGNOSIS — F17200 Nicotine dependence, unspecified, uncomplicated: Secondary | ICD-10-CM | POA: Diagnosis not present

## 2020-08-14 DIAGNOSIS — J449 Chronic obstructive pulmonary disease, unspecified: Secondary | ICD-10-CM | POA: Diagnosis not present

## 2020-08-18 ENCOUNTER — Other Ambulatory Visit: Payer: Self-pay | Admitting: *Deleted

## 2020-08-18 DIAGNOSIS — I739 Peripheral vascular disease, unspecified: Secondary | ICD-10-CM

## 2020-08-18 DIAGNOSIS — I70223 Atherosclerosis of native arteries of extremities with rest pain, bilateral legs: Secondary | ICD-10-CM

## 2020-09-06 DIAGNOSIS — J449 Chronic obstructive pulmonary disease, unspecified: Secondary | ICD-10-CM | POA: Diagnosis not present

## 2020-09-08 ENCOUNTER — Other Ambulatory Visit: Payer: Self-pay

## 2020-09-08 ENCOUNTER — Ambulatory Visit (INDEPENDENT_AMBULATORY_CARE_PROVIDER_SITE_OTHER)
Admission: RE | Admit: 2020-09-08 | Discharge: 2020-09-08 | Disposition: A | Discharge status: Home / Self Care | Payer: Medicare HMO | Source: Ambulatory Visit | Attending: Surgery | Admitting: Surgery

## 2020-09-08 ENCOUNTER — Ambulatory Visit (INDEPENDENT_AMBULATORY_CARE_PROVIDER_SITE_OTHER): Payer: Medicare HMO | Admitting: Physician Assistant

## 2020-09-08 ENCOUNTER — Ambulatory Visit (HOSPITAL_COMMUNITY)
Admission: RE | Admit: 2020-09-08 | Discharge: 2020-09-08 | Disposition: A | Discharge status: Home / Self Care | Payer: Medicare HMO | Source: Ambulatory Visit | Attending: Surgery | Admitting: Surgery

## 2020-09-08 VITALS — BP 143/73 | HR 60 | Temp 98.0°F | Resp 20 | Ht 61.0 in | Wt 87.1 lb

## 2020-09-08 DIAGNOSIS — I739 Peripheral vascular disease, unspecified: Secondary | ICD-10-CM | POA: Diagnosis not present

## 2020-09-08 DIAGNOSIS — I70223 Atherosclerosis of native arteries of extremities with rest pain, bilateral legs: Secondary | ICD-10-CM | POA: Diagnosis not present

## 2020-09-08 NOTE — Progress Notes (Signed)
Peripheral Arterial Disease Follow-Up   VASCULAR SURGERY ASSESSMENT & PLAN:   Lauren King is a 70 y.o. female who presents for follow-up of right leg rest pain.  On October 23, 2019 she underwent abdominal aortogram with mechanical thrombectomy of the right external iliac and common femoral arteries.  She underwent stent placement to the right common femoral, external iliac and common iliac arteries.  Her history is also significant for left femoral to tibioperoneal trunk bypass graft with non-reversed ipsilateral translocated saphenous vein and concomitant left iliofemoral endarterectomy and popliteal endarterectomy on December 22, 2018.  She underwent angioplasty of the left tibioperoneal trunk and peroneal artery as well as stent placement to the left external iliac artery on Sep 11, 2019 by Dr. Myra Gianotti.  Stable peripheral arterial disease.  Her right ankle-brachial index has improved from 0.26 to 0.54 today.  Her left ABIs are minimally decreased.  I do not think her nighttime foot and ankle cramping is true rest pain.    Negative history for diabetes mellitus or hypertension.  She has history of hyperlipidemia. She quit smoking greater than 1 year ago. Continue the following medications: Plavix, aspirin and statin.  Follow-up in 6 months with ABIs, left lower extremity bypass duplex and arterial duplex of iliacs.  I encouraged her to call us should she develop lower extremity pain with exercise or nonhealing wounds. I also encouraged her to discuss her foot and ankle cramps with her primary care physician.  I advised against narcotic medication for this pain.  SUBJECTIVE:   She complains of nighttime foot and ankle cramping.  She describes this as feeling as though the tissues of her feet "ball up".  She denies claudication.  PHYSICAL EXAM:   Vitals:   09/08/20 1058  Weight: 87 lb 1.6 oz (39.5 kg)  Height: 5\' 1"  (1.549 m)    General appearance: Well-developed, well-nourished in no  apparent distress Neurologic: Alert and oriented x 4. Cardiovascular: Heart rate and rhythm are regular.   Abdomen: No palpable pulsatile mass. Extremities: Skin intact.  Both feet are warm and well perfused.  Motor function and sensation intact Pulse exam: 2+ bilateral brachial, radial, femoral and left popliteal pulses.  She has dopplerable dorsalis pedis, posterior tibial as well as peroneal signals bilaterally.  The signals are monophasic.   NON-INVASIVE VASCULAR STUDIES   09/08/2020 Right ABIs appear increased compared to prior study on 10/22/2019. Left  ABIs and TBIs appear decreased compared to prior study on 10/22/2019.    Summary:  Right: Resting right ankle-brachial index indicates moderate right lower  extremity arterial disease.   Left: Resting left ankle-brachial index indicates moderate left lower  extremity arterial disease.   LE arterial duplex: Summary:  Right: Apparent occlusion was noted in the distal posterior tibial artery.  Patent stent with no evidence of stenosis in the common femoral vein  artery. Elevated velocities indicating a >50% stenosis just distal to CFA  stent. Near to total occlusion noted  in the mid to distal superficial femoral artery with reconstitution of  flow via collaterals distally.   Left: Patent left femoral-tibio-peroneal trunk bypass graft without  evidence of stenosis. The tibio-peroneal trunk appears occluded just  distal to the bypass graft distal anastomosis. Collaterals are noted with  reconstitution of flow distally.    PROBLEM LIST:    The patient's past medical history, past surgical history, family history, social history, allergy list and medication list are reviewed.   CURRENT MEDS:    Current Outpatient Medications:  .  albuterol (PROVENTIL) (2.5 MG/3ML) 0.083% nebulizer solution, Take 2.5 mg by nebulization every 6 (six) hours as needed for wheezing or shortness of breath. , Disp: , Rfl:  .  albuterol (VENTOLIN  HFA) 108 (90 Base) MCG/ACT inhaler, Inhale 2 puffs into the lungs every 6 (six) hours as needed for wheezing or shortness of breath. , Disp: , Rfl:  .  aspirin EC 81 MG tablet, Take 81 mg by mouth daily., Disp: , Rfl:  .  clopidogrel (PLAVIX) 75 MG tablet, TAKE 1 TABLET EVERY DAY (NEED MD APPOINTMENT WITH [facility]), Disp: 30 tablet, Rfl: 0 .  ibuprofen (ADVIL) 800 MG tablet, Take 800 mg by mouth every 8 (eight) hours as needed (back pain.). , Disp: , Rfl:  .  mirtazapine (REMERON) 30 MG tablet, Take 1 tablet (30 mg total) by mouth at bedtime. (Patient taking differently: Take 15-30 mg by mouth at bedtime as needed (sleep).), Disp: 90 tablet, Rfl: 1 .  oxyCODONE-acetaminophen (PERCOCET) 5-325 MG tablet, Take 1 tablet by mouth every 4 (four) hours as needed for severe pain., Disp: 20 tablet, Rfl: 0 .  OXYGEN, Inhale 2 L into the lungs daily as needed (low oxygen). , Disp: , Rfl:  .  rosuvastatin (CRESTOR) 5 MG tablet, Take 1 tablet (5 mg total) by mouth daily., Disp: 30 tablet, Rfl: 3 .  traMADol (ULTRAM) 50 MG tablet, Take 50 mg by mouth 2 (two) times daily as needed for moderate pain. , Disp: , Rfl:  .  TRELEGY ELLIPTA 100-62.5-25 MCG/INH AEPB, Inhale 1 puff into the lungs daily., Disp: , Rfl:    REVIEW OF SYSTEMS:   [X]  denotes positive finding, [ ]  denotes negative finding Cardiac  Comments:  Chest pain or chest pressure:    Shortness of breath upon exertion:    Short of breath when lying flat:    Irregular heart rhythm:        Vascular    Pain in calf, thigh, or hip brought on by ambulation:    Pain in feet at night that wakes you up from your sleep:  x   Blood clot in your veins:    Leg swelling:         Pulmonary    Oxygen at home:    Productive cough:   Chronic cough  Wheezing:         Neurologic    Sudden weakness in arms or legs:     Sudden numbness in arms or legs:     Sudden onset of difficulty speaking or slurred speech:    Temporary loss of vision  in one eye:     Problems with dizziness:         Gastrointestinal    Blood in stool:     Vomited blood:         Genitourinary    Burning when urinating:     Blood in urine:        Psychiatric    Major depression:         Hematologic    Bleeding problems:    Problems with blood clotting too easily:        Skin    Rashes or ulcers:        Constitutional    Fever or chills:     , PA-C  Office: [phone] 09/08/2020

## 2020-09-11 ENCOUNTER — Other Ambulatory Visit: Payer: Self-pay

## 2020-09-11 DIAGNOSIS — I739 Peripheral vascular disease, unspecified: Secondary | ICD-10-CM

## 2020-09-12 DIAGNOSIS — F330 Major depressive disorder, recurrent, mild: Secondary | ICD-10-CM | POA: Diagnosis not present

## 2020-09-12 DIAGNOSIS — Z87891 Personal history of nicotine dependence: Secondary | ICD-10-CM | POA: Diagnosis not present

## 2020-09-12 DIAGNOSIS — D692 Other nonthrombocytopenic purpura: Secondary | ICD-10-CM | POA: Diagnosis not present

## 2020-09-12 DIAGNOSIS — Z95820 Peripheral vascular angioplasty status with implants and grafts: Secondary | ICD-10-CM | POA: Diagnosis not present

## 2020-09-12 DIAGNOSIS — I70203 Unspecified atherosclerosis of native arteries of extremities, bilateral legs: Secondary | ICD-10-CM | POA: Diagnosis not present

## 2020-09-12 DIAGNOSIS — R630 Anorexia: Secondary | ICD-10-CM | POA: Diagnosis not present

## 2020-09-12 DIAGNOSIS — E43 Unspecified severe protein-calorie malnutrition: Secondary | ICD-10-CM | POA: Diagnosis not present

## 2020-09-12 DIAGNOSIS — J449 Chronic obstructive pulmonary disease, unspecified: Secondary | ICD-10-CM | POA: Diagnosis not present

## 2020-09-12 DIAGNOSIS — Z681 Body mass index (BMI) 19 or less, adult: Secondary | ICD-10-CM | POA: Diagnosis not present

## 2020-09-13 DIAGNOSIS — M1990 Unspecified osteoarthritis, unspecified site: Secondary | ICD-10-CM | POA: Diagnosis not present

## 2020-09-13 DIAGNOSIS — J449 Chronic obstructive pulmonary disease, unspecified: Secondary | ICD-10-CM | POA: Diagnosis not present

## 2020-09-18 ENCOUNTER — Other Ambulatory Visit: Payer: Self-pay | Admitting: Surgery

## 2020-10-06 DIAGNOSIS — J449 Chronic obstructive pulmonary disease, unspecified: Secondary | ICD-10-CM | POA: Diagnosis not present

## 2020-10-14 DIAGNOSIS — J449 Chronic obstructive pulmonary disease, unspecified: Secondary | ICD-10-CM | POA: Diagnosis not present

## 2020-10-14 DIAGNOSIS — F17210 Nicotine dependence, cigarettes, uncomplicated: Secondary | ICD-10-CM | POA: Diagnosis not present

## 2020-10-23 ENCOUNTER — Other Ambulatory Visit: Payer: Self-pay | Admitting: Vascular Surgery

## 2020-10-23 ENCOUNTER — Other Ambulatory Visit: Payer: Self-pay

## 2020-10-23 MED ORDER — CLOPIDOGREL BISULFATE 75 MG PO TABS: 75.0000 mg | ORAL_TABLET | Freq: Every day | ORAL | 6 refills | Status: DC

## 2020-11-06 DIAGNOSIS — J449 Chronic obstructive pulmonary disease, unspecified: Secondary | ICD-10-CM | POA: Diagnosis not present

## 2020-11-13 DIAGNOSIS — F17200 Nicotine dependence, unspecified, uncomplicated: Secondary | ICD-10-CM | POA: Diagnosis not present

## 2020-11-13 DIAGNOSIS — J449 Chronic obstructive pulmonary disease, unspecified: Secondary | ICD-10-CM | POA: Diagnosis not present

## 2020-12-06 DIAGNOSIS — J449 Chronic obstructive pulmonary disease, unspecified: Secondary | ICD-10-CM | POA: Diagnosis not present

## 2020-12-14 DIAGNOSIS — J449 Chronic obstructive pulmonary disease, unspecified: Secondary | ICD-10-CM | POA: Diagnosis not present

## 2020-12-14 DIAGNOSIS — M1990 Unspecified osteoarthritis, unspecified site: Secondary | ICD-10-CM | POA: Diagnosis not present

## 2021-01-06 DIAGNOSIS — J449 Chronic obstructive pulmonary disease, unspecified: Secondary | ICD-10-CM | POA: Diagnosis not present

## 2021-01-28 DIAGNOSIS — M1990 Unspecified osteoarthritis, unspecified site: Secondary | ICD-10-CM | POA: Diagnosis not present

## 2021-01-28 DIAGNOSIS — J449 Chronic obstructive pulmonary disease, unspecified: Secondary | ICD-10-CM | POA: Diagnosis not present

## 2021-01-28 DIAGNOSIS — Z23 Encounter for immunization: Secondary | ICD-10-CM | POA: Diagnosis not present

## 2021-01-28 DIAGNOSIS — F339 Major depressive disorder, recurrent, unspecified: Secondary | ICD-10-CM | POA: Diagnosis not present

## 2021-01-28 DIAGNOSIS — E46 Unspecified protein-calorie malnutrition: Secondary | ICD-10-CM | POA: Diagnosis not present

## 2021-02-06 DIAGNOSIS — J449 Chronic obstructive pulmonary disease, unspecified: Secondary | ICD-10-CM | POA: Diagnosis not present

## 2021-02-15 DIAGNOSIS — U071 COVID-19: Secondary | ICD-10-CM | POA: Diagnosis not present

## 2021-02-15 DIAGNOSIS — M791 Myalgia, unspecified site: Secondary | ICD-10-CM | POA: Diagnosis not present

## 2021-02-15 DIAGNOSIS — I10 Essential (primary) hypertension: Secondary | ICD-10-CM | POA: Diagnosis not present

## 2021-02-15 DIAGNOSIS — Z20822 Contact with and (suspected) exposure to covid-19: Secondary | ICD-10-CM | POA: Diagnosis not present

## 2021-02-27 DIAGNOSIS — J449 Chronic obstructive pulmonary disease, unspecified: Secondary | ICD-10-CM | POA: Diagnosis not present

## 2021-02-27 DIAGNOSIS — F17200 Nicotine dependence, unspecified, uncomplicated: Secondary | ICD-10-CM | POA: Diagnosis not present

## 2021-03-05 IMAGING — CT CT CHEST LUNG CANCER SCREENING LOW DOSE W/O CM
1 series · 10 of 10 positions shown, 13 images · non-contrast
Comparison: 08/10/2017

CLINICAL DATA: 68-year-old female 53 pack-year history of smoking.
Lung cancer screening.

EXAM:
CT CHEST WITHOUT CONTRAST LOW-DOSE FOR LUNG CANCER SCREENING
TECHNIQUE: Multidetector CT imaging of the chest was performed following the
standard protocol without IV contrast.

[ct lung segmentation data · axial · 0.53mm/px · z∈[-300,-300]mm · 10 of 321 frames shown]
[frame 1/321  mediastinal]
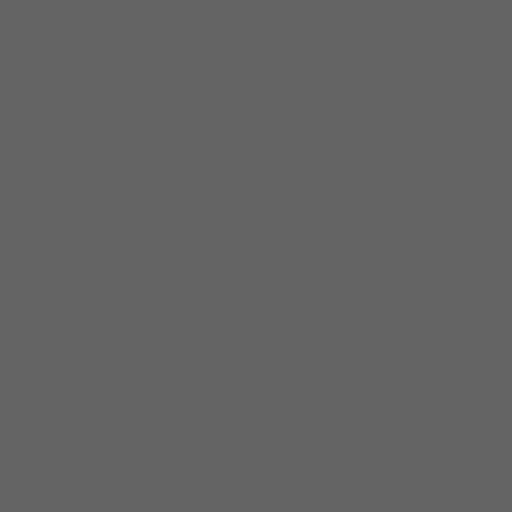
[frame 1/321  lung]
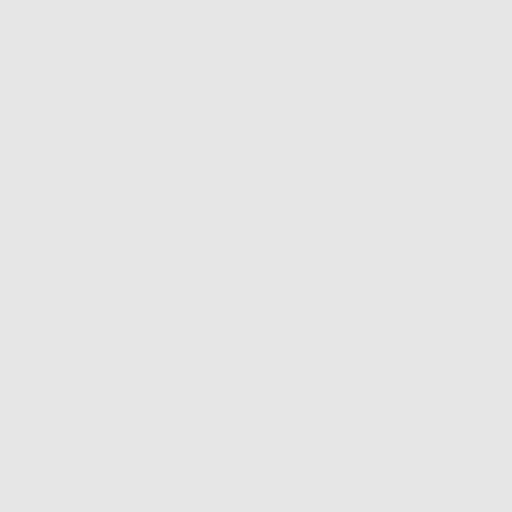
[frame 36/321  lung]
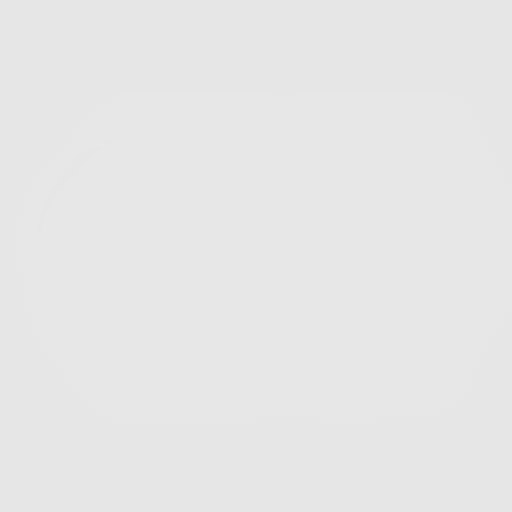
[frame 72/321  lung]
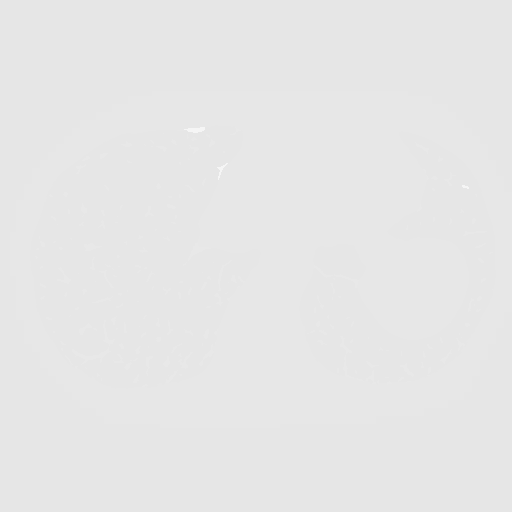
[frame 107/321  lung]
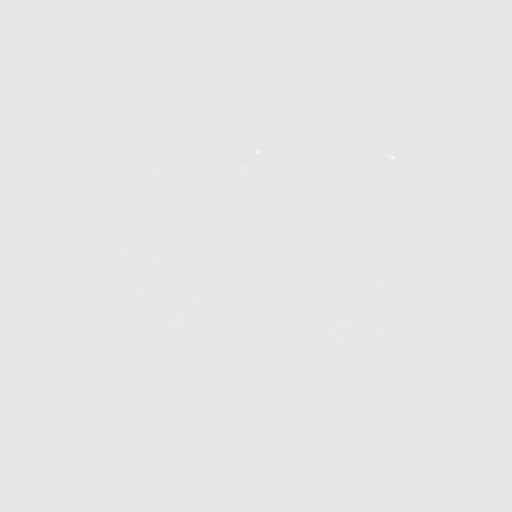
[frame 143/321  mediastinal]
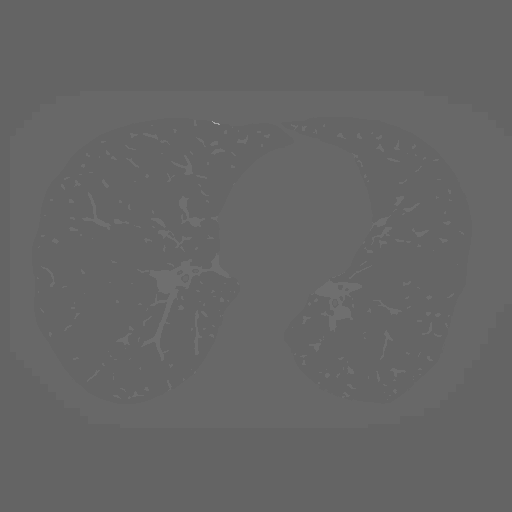
[frame 143/321  lung]
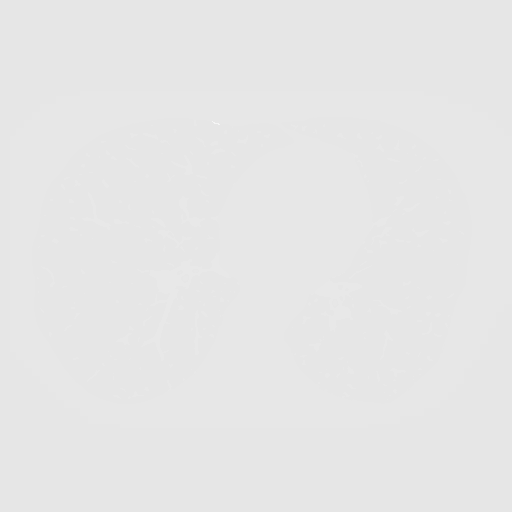
[frame 178/321  lung]
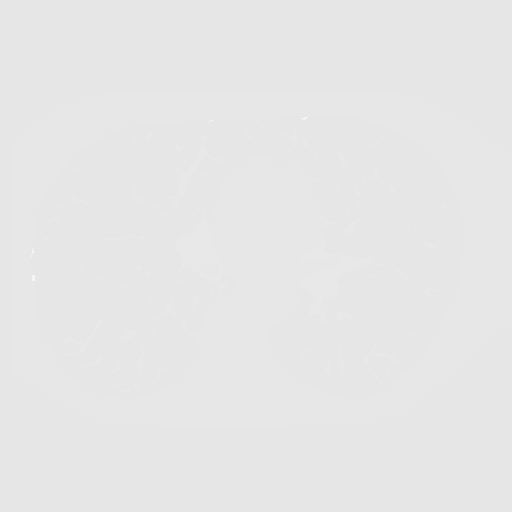
[frame 214/321  lung]
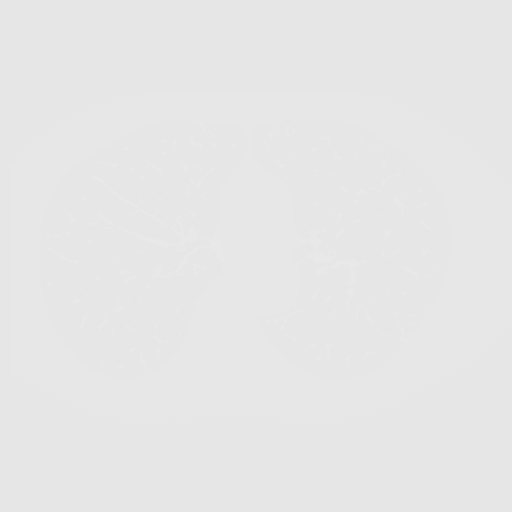
[frame 249/321  lung]
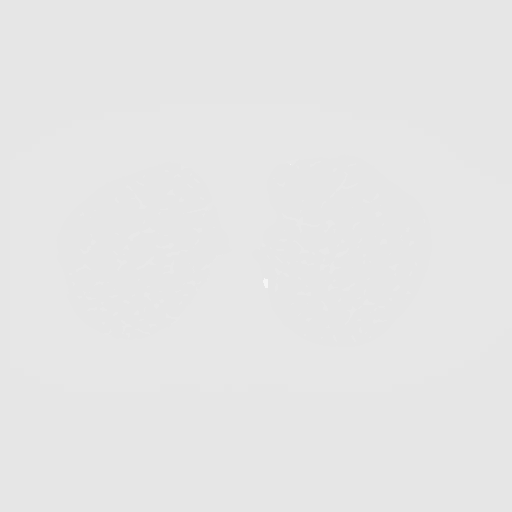
[frame 285/321  mediastinal]
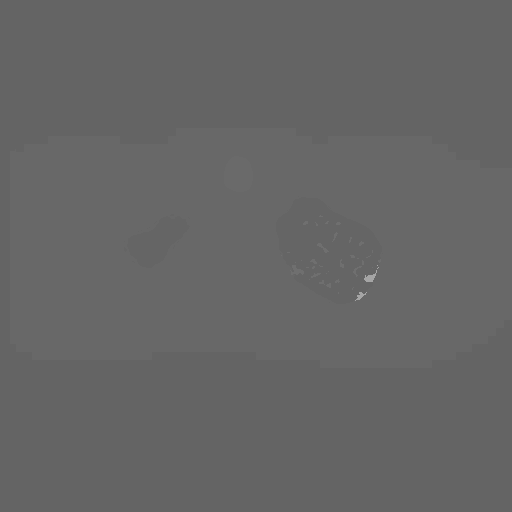
[frame 285/321  lung]
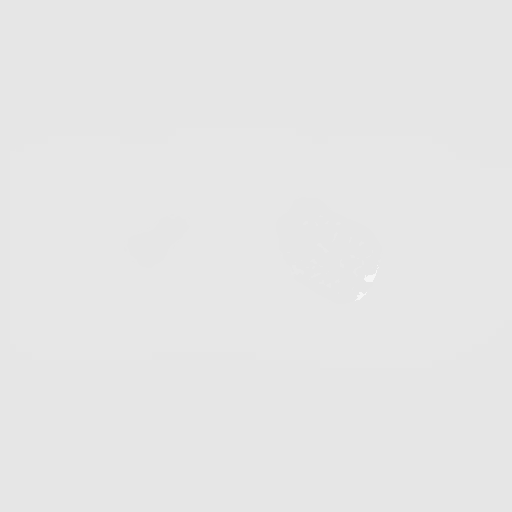
[frame 321/321  lung]
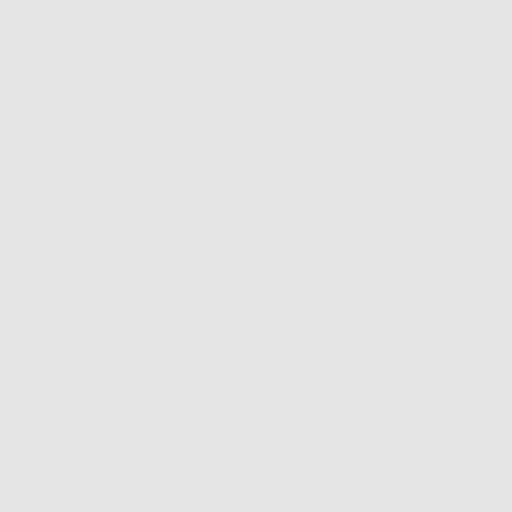

[10 of 10 positions shown; findings below may reference images not displayed]

FINDINGS: Cardiovascular: The heart size is normal. No substantial pericardial
effusion. Coronary artery calcification is evident. Atherosclerotic
calcification is noted in the wall of the thoracic aorta.

Mediastinum/Nodes: No mediastinal lymphadenopathy. Calcified nodal
tissue seen in the mediastinum and hilar regions. The esophagus has
normal imaging features. No evidence for gross hilar lymphadenopathy
although assessment is limited by the lack of intravenous contrast
on today's study. There is no axillary lymphadenopathy.

Lungs/Pleura: Centrilobular and paraseptal emphysema evident. Period
subpleural scarring noted both lung apices, right greater than left.
Tiny pulmonary nodules identified on the previous exam are stable.
No new suspicious pulmonary nodule or mass. No focal airspace
consolidation. No pulmonary edema or pleural effusion.

Upper Abdomen: Unremarkable.

Musculoskeletal: Stable tiny sclerotic focus midthoracic vertebral
body consistent with bone island.
IMPRESSION: 1. Lung-RADS 2, benign appearance or behavior. Continue annual
screening with low-dose chest CT without contrast in 12 months.
2.  Emphysema. (C0ZFM-LLV.4)
3.  Aortic Atherosclerois (C0ZFM-170.0)

## 2021-03-08 DIAGNOSIS — J449 Chronic obstructive pulmonary disease, unspecified: Secondary | ICD-10-CM | POA: Diagnosis not present

## 2021-03-16 ENCOUNTER — Ambulatory Visit (INDEPENDENT_AMBULATORY_CARE_PROVIDER_SITE_OTHER): Payer: Medicare HMO | Admitting: Physician Assistant

## 2021-03-16 ENCOUNTER — Ambulatory Visit (INDEPENDENT_AMBULATORY_CARE_PROVIDER_SITE_OTHER)
Admission: RE | Admit: 2021-03-16 | Discharge: 2021-03-16 | Disposition: A | Discharge status: Home / Self Care | Payer: Medicare HMO | Source: Ambulatory Visit | Attending: Vascular Surgery | Admitting: Vascular Surgery

## 2021-03-16 ENCOUNTER — Ambulatory Visit (HOSPITAL_COMMUNITY)
Admission: RE | Admit: 2021-03-16 | Discharge: 2021-03-16 | Disposition: A | Discharge status: Home / Self Care | Payer: Medicare HMO | Source: Ambulatory Visit | Attending: Vascular Surgery | Admitting: Vascular Surgery

## 2021-03-16 ENCOUNTER — Other Ambulatory Visit: Payer: Self-pay

## 2021-03-16 VITALS — BP 140/81 | HR 73 | Temp 98.3°F | Resp 20 | Ht 61.0 in | Wt 86.3 lb

## 2021-03-16 DIAGNOSIS — I739 Peripheral vascular disease, unspecified: Secondary | ICD-10-CM

## 2021-03-16 NOTE — Progress Notes (Addendum)
Office Note     CC:  follow up Requesting Provider:  Avon Gully, MD  HPI: Lauren King is a 70 y.o. (12/13/1950) female who presents for follow up of peripheral artery disease. She has had multiple lower extremity interventions. Most recently underwent abdominal aortogram with mechanical thrombectomy of the right external iliac and common femoral arteries.  She underwent stent placement to the right common femoral, external iliac and common iliac arteries on 10/23/19 by Dr. Myra Gianotti. This was done due to rest pain in her right leg. Prior to this she underwent angioplasty of the left tibioperoneal trunk and peroneal artery as well as stent placement to the left external iliac artery on Sep 11, 2019 by Dr. Myra Gianotti. She also has history of left femoral to tibioperoneal trunk bypass graft with non- reversed ipsilateral translocated saphenous vein and concomitant left iliofemoral endarterectomy and popliteal endarterectomy on 12/22/18.   She reports that since her last visit in May of this year she has been overall doing well. She continues to have cramping in the arch of her feet and ankles that occurs mostly at night time. This does not happen every night. The right leg seems to be worse than the left. She says when this occurs she gets up and walks around and it goes away. She very intermittently will get cramping in her legs on ambulation, but it is not all the time. She has no pain at rest. She does report occasional pins and needles and burning sensation in her feet as well. She has no tissue loss. She says she has been walking more since her last visit. She takes her Plavix, Aspirin and statin.   The pt is on a statin for cholesterol management.  The pt is on a daily aspirin.   Other AC:  Plavix The pt is  not on medication for hypertension.   The pt is not diabetic.   Tobacco hx:  Former  Past Medical History:  Diagnosis Date   Anxiety    Chronic lower back pain    COPD (chronic  obstructive pulmonary disease) (HCC)    Depression    GERD (gastroesophageal reflux disease)    Obstructive sleep apnea    CPAP    Past Surgical History:  Procedure Laterality Date   ABDOMINAL AORTOGRAM W/LOWER EXTREMITY N/A 09/11/2019   Procedure: ABDOMINAL AORTOGRAM W/LOWER EXTREMITY;  Surgeon: Nada Libman, MD;  Location: [facility];  Service: Cardiovascular;  Laterality: N/A;   ABDOMINAL AORTOGRAM W/LOWER EXTREMITY N/A 10/23/2019   Procedure: ABDOMINAL AORTOGRAM W/LOWER EXTREMITY;  Surgeon: Nada Libman, MD;  Location: [facility];  Service: Cardiovascular;  Laterality: N/A;   ABDOMINAL HYSTERECTOMY     CATARACT EXTRACTION     COLONOSCOPY  01/20/2005   BBC:[id number] colon polyp/Diminutive polyps at 30 cm ablated/Normal rectum. hyperplastic polyp   COLONOSCOPY N/A 03/25/2015   HWT:UUEKCM /left-sided diverticula   ENDARTERECTOMY FEMORAL Left 12/22/2018   Procedure: Endarterectomy Left Femoral and Politeal Arteries;  Surgeon: Nada Libman, MD;  Location: [facility];  Service: Vascular;  Laterality: Left;   ESOPHAGOGASTRODUODENOSCOPY  11/30/2004   RMR:A small patch of salmon-colored epithelium (salmon-like lesion in the distal esophagus)/Antral pre-pyloric erosions of uncertain significance, otherwise, normal. solitary duodenal bulbar AVM.No Barrett's.   ESOPHAGOGASTRODUODENOSCOPY N/A 03/25/2015   KLK:JZPHXTA reflux/schatzkis ring s/p dilation/HH/short segment barrett   ESOPHAGOGASTRODUODENOSCOPY (EGD) WITH ESOPHAGEAL DILATION N/A 09/22/2012   Rourk: erosive RE, biopsy negative for Barrett's. Gastritis.    FEMORAL-TIBIAL BYPASS GRAFT Left 12/22/2018  Procedure: BYPASS GRAFT FEMORAL-TIBIAL ARTERY LEFT LEG USING NONREVERSED LEFT GREAT SAPHENOUS VEIN;  Surgeon: Nada Libman, MD;  Location: [facility];  Service: Vascular;  Laterality: Left;   LOWER EXTREMITY ANGIOGRAPHY N/A 12/27/2017   Procedure: LOWER EXTREMITY ANGIOGRAPHY;  Surgeon: Nada Libman, MD;  Location: [facility];  Service: Cardiovascular;  Laterality: N/A;   LOWER EXTREMITY ANGIOGRAPHY N/A 11/21/2018   Procedure: LOWER EXTREMITY ANGIOGRAPHY;  Surgeon: Nada Libman, MD;  Location: [facility];  Service: Cardiovascular;  Laterality: N/A;   MALONEY DILATION N/A 03/25/2015   Procedure: Elease Hashimoto DILATION;  Surgeon: Corbin Ade, MD;  Location: [facility];  Service: Endoscopy;  Laterality: N/A;   PERIPHERAL VASCULAR INTERVENTION Left 09/11/2019   Procedure: PERIPHERAL VASCULAR INTERVENTION;  Surgeon: Nada Libman, MD;  Location: [facility];  Service: Cardiovascular;  Laterality: Left;   PERIPHERAL VASCULAR INTERVENTION Right 10/23/2019   Procedure: PERIPHERAL VASCULAR INTERVENTION;  Surgeon: Nada Libman, MD;  Location: [facility];  Service: Cardiovascular;  Laterality: Right;  external iliac   SKIN CANCER DESTRUCTION     TUBAL LIGATION     with incidental appendectomy   VEIN HARVEST Left 12/22/2018   Procedure: Vein Harvest Left Great Saphenous;  Surgeon: Nada Libman, MD;  Location: [facility] OR;  Service: Vascular;  Laterality: Left;    Social History   Socioeconomic History   Marital status: Divorced    Spouse name: Not on file   Number of children: Not on file   Years of education: Not on file   Highest education level: Not on file  Occupational History   Not on file  Tobacco Use   Smoking status: Former    Packs/day: 0.40    Years: 40.00    Pack years: 16.00    Types: Cigarettes   Smokeless tobacco: Never   Tobacco comments:    5 a day  Vaping Use   Vaping Use: Never used  Substance and Sexual Activity   Alcohol use: No   Drug use: No   Sexual activity: Not on file  Other Topics Concern   Not on file  Social History Narrative   Not on file   Social Determinants of Health   Financial Resource Strain: Not on file  Food Insecurity: Not on file  Transportation Needs: Not on file  Physical Activity: Not on file  Stress: Not on file   Social Connections: Not on file  Intimate Partner Violence: Not on file    Family History  Problem Relation Age of Onset   Lung cancer Sister    Lung cancer Brother    Colon cancer Neg Hx     Current Outpatient Medications  Medication Sig Dispense Refill   albuterol (PROVENTIL) (2.5 MG/3ML) 0.083% nebulizer solution Take 2.5 mg by nebulization every 6 (six) hours as needed for wheezing or shortness of breath.      albuterol (VENTOLIN HFA) 108 (90 Base) MCG/ACT inhaler Inhale 2 puffs into the lungs every 6 (six) hours as needed for wheezing or shortness of breath.      aspirin EC 81 MG tablet Take 81 mg by mouth daily.     clopidogrel (PLAVIX) 75 MG tablet Take 1 tablet (75 mg total) by mouth daily. 30 tablet 6   ibuprofen (ADVIL) 800 MG tablet Take 800 mg by mouth every 8 (eight) hours as needed (back pain.).      mirtazapine (REMERON) 30 MG tablet Take 1 tablet (30  mg total) by mouth at bedtime. (Patient taking differently: Take 15-30 mg by mouth at bedtime as needed (sleep).) 90 tablet 1   OXYGEN Inhale 2 L into the lungs daily as needed (low oxygen).      rosuvastatin (CRESTOR) 5 MG tablet Take 1 tablet (5 mg total) by mouth daily. 30 tablet 3   traMADol (ULTRAM) 50 MG tablet Take 50 mg by mouth 2 (two) times daily as needed for moderate pain.      TRELEGY ELLIPTA 100-62.5-25 MCG/INH AEPB Inhale 1 puff into the lungs daily.     No current facility-administered medications for this visit.    No Known Allergies   REVIEW OF SYSTEMS:  [X]  denotes positive finding, [ ]  denotes negative finding Cardiac  Comments:  Chest pain or chest pressure:    Shortness of breath upon exertion:    Short of breath when lying flat:    Irregular heart rhythm:        Vascular    Pain in calf, thigh, or hip brought on by ambulation:    Pain in feet at night that wakes you up from your sleep:     Blood clot in your veins:    Leg swelling:         Pulmonary    Oxygen at home: X   Productive  cough:     Wheezing:         Neurologic    Sudden weakness in arms or legs:     Sudden numbness in arms or legs:     Sudden onset of difficulty speaking or slurred speech:    Temporary loss of vision in one eye:     Problems with dizziness:         Gastrointestinal    Blood in stool:     Vomited blood:         Genitourinary    Burning when urinating:     Blood in urine:        Psychiatric    Major depression:         Hematologic    Bleeding problems:    Problems with blood clotting too easily:        Skin    Rashes or ulcers:        Constitutional    Fever or chills:      PHYSICAL EXAMINATION:  Vitals:   03/16/21 1036  BP: 140/81  Pulse: 73  Resp: 20  Temp: 98.3 F (36.8 C)  TempSrc: Temporal  SpO2: 98%  Weight: 86 lb 4.8 oz (39.1 kg)  Height: 5\' 1"  (1.549 m)    General:  WDWN in NAD; vital signs documented above Gait: Normal HENT: WNL, normocephalic Pulmonary: normal non-labored breathing , without wheezing Cardiac: regular HR, without  Murmurs without carotid bruit Abdomen: soft, NT, no masses. Audible bruit over LLQ Vascular Exam/Pulses:  Right Left  Radial 2+ (normal) 2+ (normal)  Femoral 2+ (normal) 2+ (normal)  Popliteal 2+ (normal) 2+ (normal)  DP absent absent  PT absent absent   Extremities: without ischemic changes, without Gangrene , without cellulitis; without open wounds; feet are warm and well perfused Musculoskeletal: no muscle wasting or atrophy  Neurologic: A&O X 3;  No focal weakness or paresthesias are detected Psychiatric:  The pt has Normal affect.   Non-Invasive Vascular Imaging:  03/16/21 +-------+-----------+-----------+------------+------------+  ABI/TBIToday's ABIToday's TBIPrevious ABIPrevious TBI  +-------+-----------+-----------+------------+------------+  Right  0.63       0.46  0.54        0             +-------+-----------+-----------+------------+------------+  Left   0.57       0.33        0.56        0             +-------+-----------+-----------+------------+------------+   VAS [place] lower extremity bypass graft duplex: Summary:  Left: Patent left femoral-tibio-peroneal trunk bypass graft without evidence of stenosis. The tibio-peroneal trunk appears occluded just distal to the bypass graft distal anastomosis. Collaterals are noted with reconstitution of flow distally.   VAS US Aorta/IVC/Iliacs: Summary:  Stenosis: Patent right iliac/femoral stent with elevated velocities in the stent outflow suggesting 50-99% stenosis   Patent left external iliac stent with elevated velocities in the stent inflow suggesting 50-99% stenosis.    ASSESSMENT/PLAN:: 70 y.o. female here for follow up for peripheral artery disease. She continues to have cramping in her feet but she is not having any claudication symptoms, rest pain or tissue loss. I have encouraged her to continue walking as tolerated. - Aortoiliac duplex shows some continued elevated velocities in both iliac stents. These are unchanged from prior studies. - Duplex today shows patent LLE fem- TPT bypass. The TPT is occluded but this is unchanged from prior studies.  - ABI's are essentially unchanged and TBI's are improved bilaterally from her prior study - continue Aspirin, statin and Plavix - We will have her return in 6 months with LLE bypass graft duplex, ABI;'s and Aortoiliac duplex - She knows to call for earlier follow up if she has any new or concerning symptoms   Graceann Congress, PA-C [facility] [phone]  Clinic MD:  Colin Benton

## 2021-03-17 ENCOUNTER — Other Ambulatory Visit: Payer: Self-pay

## 2021-03-17 DIAGNOSIS — I739 Peripheral vascular disease, unspecified: Secondary | ICD-10-CM

## 2021-03-30 DIAGNOSIS — J449 Chronic obstructive pulmonary disease, unspecified: Secondary | ICD-10-CM | POA: Diagnosis not present

## 2021-03-30 DIAGNOSIS — F17210 Nicotine dependence, cigarettes, uncomplicated: Secondary | ICD-10-CM | POA: Diagnosis not present

## 2021-04-08 DIAGNOSIS — J449 Chronic obstructive pulmonary disease, unspecified: Secondary | ICD-10-CM | POA: Diagnosis not present

## 2021-04-29 DIAGNOSIS — M1990 Unspecified osteoarthritis, unspecified site: Secondary | ICD-10-CM | POA: Diagnosis not present

## 2021-04-29 DIAGNOSIS — J449 Chronic obstructive pulmonary disease, unspecified: Secondary | ICD-10-CM | POA: Diagnosis not present

## 2021-04-30 DIAGNOSIS — H00022 Hordeolum internum right lower eyelid: Secondary | ICD-10-CM | POA: Diagnosis not present

## 2021-05-08 DIAGNOSIS — J449 Chronic obstructive pulmonary disease, unspecified: Secondary | ICD-10-CM | POA: Diagnosis not present

## 2021-05-30 DIAGNOSIS — J449 Chronic obstructive pulmonary disease, unspecified: Secondary | ICD-10-CM | POA: Diagnosis not present

## 2021-05-30 DIAGNOSIS — F17210 Nicotine dependence, cigarettes, uncomplicated: Secondary | ICD-10-CM | POA: Diagnosis not present

## 2021-06-08 DIAGNOSIS — J449 Chronic obstructive pulmonary disease, unspecified: Secondary | ICD-10-CM | POA: Diagnosis not present

## 2021-06-30 DIAGNOSIS — M1990 Unspecified osteoarthritis, unspecified site: Secondary | ICD-10-CM | POA: Diagnosis not present

## 2021-06-30 DIAGNOSIS — F17200 Nicotine dependence, unspecified, uncomplicated: Secondary | ICD-10-CM | POA: Diagnosis not present

## 2021-07-07 DIAGNOSIS — J449 Chronic obstructive pulmonary disease, unspecified: Secondary | ICD-10-CM | POA: Diagnosis not present

## 2021-07-13 DIAGNOSIS — I10 Essential (primary) hypertension: Secondary | ICD-10-CM | POA: Diagnosis not present

## 2021-07-13 DIAGNOSIS — E43 Unspecified severe protein-calorie malnutrition: Secondary | ICD-10-CM | POA: Diagnosis not present

## 2021-07-13 DIAGNOSIS — J441 Chronic obstructive pulmonary disease with (acute) exacerbation: Secondary | ICD-10-CM | POA: Diagnosis not present

## 2021-07-13 DIAGNOSIS — M1990 Unspecified osteoarthritis, unspecified site: Secondary | ICD-10-CM | POA: Diagnosis not present

## 2021-07-15 ENCOUNTER — Other Ambulatory Visit (HOSPITAL_COMMUNITY): Payer: Self-pay | Admitting: Gerontology

## 2021-07-15 ENCOUNTER — Other Ambulatory Visit (HOSPITAL_COMMUNITY)
Admission: RE | Admit: 2021-07-15 | Discharge: 2021-07-15 | Disposition: A | Discharge status: Home / Self Care | Payer: Medicare HMO | Source: Ambulatory Visit | Attending: Internal Medicine | Admitting: Internal Medicine

## 2021-07-15 ENCOUNTER — Other Ambulatory Visit: Payer: Self-pay

## 2021-07-15 ENCOUNTER — Ambulatory Visit (HOSPITAL_COMMUNITY)
Admission: RE | Admit: 2021-07-15 | Discharge: 2021-07-15 | Disposition: A | Discharge status: Home / Self Care | Payer: Medicare HMO | Source: Ambulatory Visit | Attending: Gerontology | Admitting: Gerontology

## 2021-07-15 DIAGNOSIS — E785 Hyperlipidemia, unspecified: Secondary | ICD-10-CM | POA: Insufficient documentation

## 2021-07-15 DIAGNOSIS — Z0001 Encounter for general adult medical examination with abnormal findings: Secondary | ICD-10-CM | POA: Insufficient documentation

## 2021-07-15 DIAGNOSIS — E43 Unspecified severe protein-calorie malnutrition: Secondary | ICD-10-CM | POA: Insufficient documentation

## 2021-07-15 DIAGNOSIS — Z87891 Personal history of nicotine dependence: Secondary | ICD-10-CM | POA: Insufficient documentation

## 2021-07-15 DIAGNOSIS — R059 Cough, unspecified: Secondary | ICD-10-CM

## 2021-07-15 DIAGNOSIS — J439 Emphysema, unspecified: Secondary | ICD-10-CM | POA: Insufficient documentation

## 2021-07-15 DIAGNOSIS — I10 Essential (primary) hypertension: Secondary | ICD-10-CM | POA: Insufficient documentation

## 2021-07-15 LAB — BASIC METABOLIC PANEL
Anion gap: 10 (ref 5–15)
BUN: 18 mg/dL (ref 8–23)
CO2: 24 mmol/L (ref 22–32)
Calcium: 9.4 mg/dL (ref 8.9–10.3)
Chloride: 105 mmol/L (ref 98–111)
Creatinine, Ser: 0.78 mg/dL (ref 0.44–1.00)
GFR, Estimated: >60 mL/min (ref >60)
Glucose, Bld: 133 mg/dL — ABNORMAL HIGH (ref 70–99)
Potassium: 3.8 mmol/L (ref 3.5–5.1)
Sodium: 139 mmol/L (ref 135–145)

## 2021-07-15 LAB — CBC WITH DIFFERENTIAL/PLATELET
Abs Immature Granulocytes: 0.08 10*3/uL — ABNORMAL HIGH (ref 0.00–0.07)
Basophils Absolute: 0.1 10*3/uL (ref 0.0–0.1)
Basophils Relative: 1 %
Eosinophils Absolute: 0.1 10*3/uL (ref 0.0–0.5)
Eosinophils Relative: 1 %
HCT: 46.4 % — ABNORMAL HIGH (ref 36.0–46.0)
Hemoglobin: 14.9 g/dL (ref 12.0–15.0)
Immature Granulocytes: 1 %
Lymphocytes Relative: 18 %
Lymphs Abs: 1.7 10*3/uL (ref 0.7–4.0)
MCH: 31.2 pg (ref 26.0–34.0)
MCHC: 32.1 g/dL (ref 30.0–36.0)
MCV: 97.1 fL (ref 80.0–100.0)
Monocytes Absolute: 0.4 10*3/uL (ref 0.1–1.0)
Monocytes Relative: 4 %
Neutro Abs: 7 10*3/uL (ref 1.7–7.7)
Neutrophils Relative %: 75 %
Platelets: 321 10*3/uL (ref 150–400)
RBC: 4.78 MIL/uL (ref 3.87–5.11)
RDW: 13.5 % (ref 11.5–15.5)
WBC: 9.2 10*3/uL (ref 4.0–10.5)
nRBC: 0 % (ref 0.0–0.2)

## 2021-07-15 LAB — LIPID PANEL
Cholesterol: 187 mg/dL (ref 0–200)
HDL: 73 mg/dL (ref >40)
LDL Cholesterol: 102 mg/dL — ABNORMAL HIGH (ref 0–99)
Total CHOL/HDL Ratio: 2.6 RATIO
Triglycerides: 59 mg/dL (ref <150)
VLDL: 12 mg/dL (ref 0–40)

## 2021-07-15 LAB — HEPATIC FUNCTION PANEL
ALT: 28 U/L (ref 0–44)
AST: 40 U/L (ref 15–41)
Albumin: 4.2 g/dL (ref 3.5–5.0)
Alkaline Phosphatase: 91 U/L (ref 38–126)
Bilirubin, Direct: <0.1 mg/dL (ref 0.0–0.2)
Total Bilirubin: 0.4 mg/dL (ref 0.3–1.2)
Total Protein: 7.6 g/dL (ref 6.5–8.1)

## 2021-08-06 DIAGNOSIS — J449 Chronic obstructive pulmonary disease, unspecified: Secondary | ICD-10-CM | POA: Diagnosis not present

## 2021-08-20 DIAGNOSIS — Z681 Body mass index (BMI) 19 or less, adult: Secondary | ICD-10-CM | POA: Diagnosis not present

## 2021-08-20 DIAGNOSIS — Z79899 Other long term (current) drug therapy: Secondary | ICD-10-CM | POA: Diagnosis not present

## 2021-09-03 DIAGNOSIS — Z1389 Encounter for screening for other disorder: Secondary | ICD-10-CM | POA: Diagnosis not present

## 2021-09-03 DIAGNOSIS — J449 Chronic obstructive pulmonary disease, unspecified: Secondary | ICD-10-CM | POA: Diagnosis not present

## 2021-09-03 DIAGNOSIS — Z23 Encounter for immunization: Secondary | ICD-10-CM | POA: Diagnosis not present

## 2021-09-03 DIAGNOSIS — I10 Essential (primary) hypertension: Secondary | ICD-10-CM | POA: Diagnosis not present

## 2021-09-03 DIAGNOSIS — Z1331 Encounter for screening for depression: Secondary | ICD-10-CM | POA: Diagnosis not present

## 2021-09-03 DIAGNOSIS — Z0001 Encounter for general adult medical examination with abnormal findings: Secondary | ICD-10-CM | POA: Diagnosis not present

## 2021-09-03 DIAGNOSIS — F339 Major depressive disorder, recurrent, unspecified: Secondary | ICD-10-CM | POA: Diagnosis not present

## 2021-09-06 DIAGNOSIS — J449 Chronic obstructive pulmonary disease, unspecified: Secondary | ICD-10-CM | POA: Diagnosis not present

## 2021-09-14 DIAGNOSIS — Z79899 Other long term (current) drug therapy: Secondary | ICD-10-CM | POA: Diagnosis not present

## 2021-09-14 DIAGNOSIS — Z681 Body mass index (BMI) 19 or less, adult: Secondary | ICD-10-CM | POA: Diagnosis not present

## 2021-09-18 DIAGNOSIS — M419 Scoliosis, unspecified: Secondary | ICD-10-CM | POA: Diagnosis not present

## 2021-09-18 DIAGNOSIS — M545 Low back pain, unspecified: Secondary | ICD-10-CM | POA: Diagnosis not present

## 2021-09-18 DIAGNOSIS — S39012A Strain of muscle, fascia and tendon of lower back, initial encounter: Secondary | ICD-10-CM | POA: Diagnosis not present

## 2021-09-18 DIAGNOSIS — X501XXA Overexertion from prolonged static or awkward postures, initial encounter: Secondary | ICD-10-CM | POA: Diagnosis not present

## 2021-09-18 DIAGNOSIS — M2550 Pain in unspecified joint: Secondary | ICD-10-CM | POA: Diagnosis not present

## 2021-09-24 ENCOUNTER — Ambulatory Visit: Payer: Medicare HMO

## 2021-09-24 ENCOUNTER — Encounter (HOSPITAL_COMMUNITY): Payer: Medicare HMO

## 2021-09-24 ENCOUNTER — Other Ambulatory Visit (HOSPITAL_COMMUNITY): Payer: Medicare HMO

## 2021-10-03 DIAGNOSIS — M1990 Unspecified osteoarthritis, unspecified site: Secondary | ICD-10-CM | POA: Diagnosis not present

## 2021-10-03 DIAGNOSIS — J449 Chronic obstructive pulmonary disease, unspecified: Secondary | ICD-10-CM | POA: Diagnosis not present

## 2021-10-06 DIAGNOSIS — J449 Chronic obstructive pulmonary disease, unspecified: Secondary | ICD-10-CM | POA: Diagnosis not present

## 2021-10-15 DIAGNOSIS — Z79899 Other long term (current) drug therapy: Secondary | ICD-10-CM | POA: Diagnosis not present

## 2021-10-15 DIAGNOSIS — Z681 Body mass index (BMI) 19 or less, adult: Secondary | ICD-10-CM | POA: Diagnosis not present

## 2021-10-19 DIAGNOSIS — Z79899 Other long term (current) drug therapy: Secondary | ICD-10-CM | POA: Diagnosis not present

## 2021-10-26 ENCOUNTER — Ambulatory Visit (INDEPENDENT_AMBULATORY_CARE_PROVIDER_SITE_OTHER): Payer: Medicare HMO | Admitting: Surgery

## 2021-10-26 ENCOUNTER — Ambulatory Visit (INDEPENDENT_AMBULATORY_CARE_PROVIDER_SITE_OTHER)
Admission: RE | Admit: 2021-10-26 | Discharge: 2021-10-26 | Disposition: A | Discharge status: Home / Self Care | Payer: Medicare HMO | Source: Ambulatory Visit | Attending: Physician Assistant | Admitting: Physician Assistant

## 2021-10-26 ENCOUNTER — Ambulatory Visit (HOSPITAL_COMMUNITY)
Admission: RE | Admit: 2021-10-26 | Discharge: 2021-10-26 | Disposition: A | Discharge status: Home / Self Care | Payer: Medicare HMO | Source: Ambulatory Visit | Attending: Surgery | Admitting: Surgery

## 2021-10-26 ENCOUNTER — Encounter: Payer: Self-pay | Admitting: Surgery

## 2021-10-26 VITALS — BP 112/66 | HR 58 | Temp 97.7°F | Resp 20 | Ht 61.0 in | Wt 86.3 lb

## 2021-10-26 DIAGNOSIS — I70213 Atherosclerosis of native arteries of extremities with intermittent claudication, bilateral legs: Secondary | ICD-10-CM | POA: Diagnosis not present

## 2021-10-26 DIAGNOSIS — I739 Peripheral vascular disease, unspecified: Secondary | ICD-10-CM | POA: Insufficient documentation

## 2021-10-26 NOTE — Progress Notes (Signed)
[facility] [place]  Patient name: MASHAL SLAVICK MRN: [id number] DOB: [birth year] Sex: female   REASON FOR VISIT:    Follow up  [name]:    SHREENA BAINES is a 71 y.o. female who has undergone the following procedures:  12/27/2017: Abdominal aortogram 11/21/2018: Abdominal aortogram 12/22/2018: Left femoral to tibioperoneal trunk bypass with saphenous vein (rest pain) 09/11/2019: Angioplasty left tibioperoneal trunk and peroneal artery, stent left external iliac artery rest pain 10/23/2019: Mechanical thrombectomy right external iliac and common femoral artery with stenting of the right common femoral, external iliac, and common iliac artery (rest pain)  She is back today for follow-up.  She does not report any symptoms of claudication.  She denies rest pain or nonhealing wounds.  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  She takes a statin for hypercholesterolemia.  She is on Plavix. PAST MEDICAL HISTORY:   Past Medical History:  Diagnosis Date   Anxiety    Chronic lower back pain    COPD (chronic obstructive pulmonary disease) (HCC)    Depression    GERD (gastroesophageal reflux disease)    Obstructive sleep apnea    CPAP     FAMILY HISTORY:   Family History  Problem Relation Age of Onset   Lung cancer Sister    Lung cancer Brother    Colon cancer Neg Hx     SOCIAL HISTORY:   Social History   Tobacco Use   Smoking status: Former    Packs/day: 0.40    Years: 40.00    Total pack years: 16.00    Types: Cigarettes   Smokeless tobacco: Never   Tobacco comments:    5 a day  Substance Use Topics   Alcohol use: No     ALLERGIES:   No Known Allergies   CURRENT MEDICATIONS:   Current Outpatient Medications  Medication Sig Dispense Refill   albuterol (PROVENTIL) (2.5 MG/3ML) 0.083% nebulizer solution Take 2.5 mg by nebulization every 6 (six) hours as  needed for wheezing or shortness of breath.      albuterol (VENTOLIN HFA) 108 (90 Base) MCG/ACT inhaler Inhale 2 puffs into the lungs every 6 (six) hours as needed for wheezing or shortness of breath.      aspirin EC 81 MG tablet Take 81 mg by mouth daily.     clopidogrel (PLAVIX) 75 MG tablet Take 1 tablet (75 mg total) by mouth daily. 30 tablet 6   ibuprofen (ADVIL) 800 MG tablet Take 800 mg by mouth every 8 (eight) hours as needed (back pain.).      losartan-hydrochlorothiazide (HYZAAR) 50-12.5 MG tablet Take 1 tablet by mouth daily.     mirtazapine (REMERON) 30 MG tablet Take 1 tablet (30 mg total) by mouth at bedtime. (Patient taking differently: Take 15-30 mg by mouth at bedtime as needed (sleep).) 90 tablet 1   oxyCODONE (OXY IR/ROXICODONE) 5 MG immediate release tablet SMARTSIG:1 Tablet(s) By Mouth 6 Times Daily PRN     OXYGEN Inhale 2 L into the lungs daily as needed (low oxygen).      rosuvastatin (CRESTOR) 5 MG tablet Take 1 tablet (5 mg total) by mouth daily. 30 tablet 3   traMADol (ULTRAM) 50 MG tablet Take 50 mg by mouth 2 (two) times daily as needed for moderate pain.      TRELEGY ELLIPTA 100-62.5-25 MCG/INH AEPB Inhale 1 puff into the lungs daily.     No current facility-administered medications for  this visit.    REVIEW OF SYSTEMS:   [X]  denotes positive finding, [ ]  denotes negative finding Cardiac  Comments:  Chest pain or chest pressure:    Shortness of breath upon exertion:    Short of breath when lying flat:    Irregular heart rhythm:        Vascular    Pain in calf, thigh, or hip brought on by ambulation:    Pain in feet at night that wakes you up from your sleep:     Blood clot in your veins:    Leg swelling:         Pulmonary    Oxygen at home:    Productive cough:     Wheezing:         Neurologic    Sudden weakness in arms or legs:     Sudden numbness in arms or legs:     Sudden onset of difficulty speaking or slurred speech:    Temporary loss of  vision in one eye:     Problems with dizziness:         Gastrointestinal    Blood in stool:     Vomited blood:         Genitourinary    Burning when urinating:     Blood in urine:        Psychiatric    Major depression:         Hematologic    Bleeding problems:    Problems with blood clotting too easily:        Skin    Rashes or ulcers:        Constitutional    Fever or chills:      PHYSICAL EXAM:   Vitals:   10/26/21 1107  BP: 112/66  Pulse: (!) 58  Resp: 20  Temp: 97.7 F (36.5 C)  SpO2: 94%  Weight: 86 lb 4.8 oz (39.1 kg)  Height: 5\' 1"  (1.549 m)    GENERAL: The patient is a well-nourished female, in no acute distress. The vital signs are documented above. CARDIAC: There is a regular rate and rhythm.  VASCULAR: Nonpalpable pedal pulses PULMONARY: Non-labored respirations NEUROLOGIC: No focal weakness or paresthesias are detected. SKIN: There are no ulcers or rashes noted. PSYCHIATRIC: The patient has a normal affect.  STUDIES:   I have reviewed the following studies: +-------+-----------+-----------+------------+------------+  ABI/TBIToday's ABIToday's TBIPrevious ABIPrevious TBI  +-------+-----------+-----------+------------+------------+  Right  .58        .39        .63         .46           +-------+-----------+-----------+------------+------------+  Left   .59        .27        .57         .33           +-------+-----------+-----------+------------+------------+ Right toe pressure: 55 Left toe pressure: 38  Bypass duplex: Left: No significant change as compared to previous study.   The left femoral-to-TPT bypass graft is widely patent without evidence of  stenosis. Occluded TPT just distal to the distal anastomosis with  reconstitution of flow futher distally via collaterals, consisitent with  prior exam.   Aortoiliac: The right common iliac, external iliac and common femoral artery stent is  patent with elevated  velocities noted post stent segment; essentially  stable velocities suggesting 50-99% stenosis when compared to the prior  exam.  3 91 cm/s velocity distal to  right iliac stent MEDICAL ISSUES:   PAD: The patient remains asymptomatic.  Her ABIs are essentially unchanged.  Ultrasound findings today show that the tibioperoneal trunk is occluded distal to the anastomosis.  I have previously been able to recanalize her posterior tibial artery however this is reoccluded.  She is not symptomatic from this and so I would not recommend further intervention unless she develops rest pain or ulcers.  She does have a stenosis in the right common femoral artery below her stents.  Her superficial femoral artery is known to be occluded.  She is asymptomatic from this.  Velocities were 391 cm/s.  I am going to bring her back in 6 months for repeat imaging.  If she develops symptoms, or if the velocity profile significantly increases, I would consider scheduling her for angiography.    Charlena Cross, MD, FACS [facility] [phone] Pager [phone]

## 2021-10-28 ENCOUNTER — Other Ambulatory Visit: Payer: Self-pay

## 2021-10-28 DIAGNOSIS — I739 Peripheral vascular disease, unspecified: Secondary | ICD-10-CM

## 2021-10-28 DIAGNOSIS — I70213 Atherosclerosis of native arteries of extremities with intermittent claudication, bilateral legs: Secondary | ICD-10-CM

## 2021-11-03 DIAGNOSIS — M1990 Unspecified osteoarthritis, unspecified site: Secondary | ICD-10-CM | POA: Diagnosis not present

## 2021-11-03 DIAGNOSIS — J449 Chronic obstructive pulmonary disease, unspecified: Secondary | ICD-10-CM | POA: Diagnosis not present

## 2021-11-06 DIAGNOSIS — J449 Chronic obstructive pulmonary disease, unspecified: Secondary | ICD-10-CM | POA: Diagnosis not present

## 2021-11-13 DIAGNOSIS — Z79899 Other long term (current) drug therapy: Secondary | ICD-10-CM | POA: Diagnosis not present

## 2021-11-13 DIAGNOSIS — Z681 Body mass index (BMI) 19 or less, adult: Secondary | ICD-10-CM | POA: Diagnosis not present

## 2021-11-16 DIAGNOSIS — Z79899 Other long term (current) drug therapy: Secondary | ICD-10-CM | POA: Diagnosis not present

## 2021-12-03 DIAGNOSIS — E785 Hyperlipidemia, unspecified: Secondary | ICD-10-CM | POA: Diagnosis not present

## 2021-12-03 DIAGNOSIS — I10 Essential (primary) hypertension: Secondary | ICD-10-CM | POA: Diagnosis not present

## 2021-12-06 DIAGNOSIS — J449 Chronic obstructive pulmonary disease, unspecified: Secondary | ICD-10-CM | POA: Diagnosis not present

## 2021-12-10 DIAGNOSIS — Z79899 Other long term (current) drug therapy: Secondary | ICD-10-CM | POA: Diagnosis not present

## 2021-12-10 DIAGNOSIS — Z681 Body mass index (BMI) 19 or less, adult: Secondary | ICD-10-CM | POA: Diagnosis not present

## 2021-12-22 ENCOUNTER — Telehealth: Payer: Self-pay | Admitting: Surgery

## 2021-12-22 NOTE — Telephone Encounter (Signed)
PT called back and I have her scheduled for Monday. Did offer sooner appointment. Patient declined and opted for the Monday appointmentm

## 2021-12-22 NOTE — Telephone Encounter (Signed)
-----   Message from Victorino Sparrow, MD sent at 12/22/2021 11:51 AM EDT ----- Regarding: RE: urgent appointment Thank you for your help ----- Message ----- From: Leone Haven Sent: 12/22/2021  10:29 AM EDT To: Nada Libman, MD; Roxy Horseman; # Subject: FW: urgent appointment                         Spoke with this patient, she said she is no longer feeling pain and wants to wait on scheduling a follow-up. She stated she had no transportation and if she feel any discomfort or further pain she will call our office.  Byrd Hesselbach ----- Message ----- From: Victorino Sparrow, MD Sent: 12/20/2021  12:38 PM EDT To: Osborne Oman, RN; [facility] Subject: urgent appointment                             Needs urgent appt for rest pain. Earliest available

## 2021-12-25 DIAGNOSIS — E46 Unspecified protein-calorie malnutrition: Secondary | ICD-10-CM | POA: Diagnosis not present

## 2021-12-25 DIAGNOSIS — F32A Depression, unspecified: Secondary | ICD-10-CM | POA: Diagnosis not present

## 2021-12-25 DIAGNOSIS — Z681 Body mass index (BMI) 19 or less, adult: Secondary | ICD-10-CM | POA: Diagnosis not present

## 2021-12-25 DIAGNOSIS — R410 Disorientation, unspecified: Secondary | ICD-10-CM | POA: Diagnosis not present

## 2021-12-25 DIAGNOSIS — R079 Chest pain, unspecified: Secondary | ICD-10-CM | POA: Diagnosis not present

## 2021-12-25 DIAGNOSIS — J439 Emphysema, unspecified: Secondary | ICD-10-CM | POA: Diagnosis not present

## 2021-12-25 DIAGNOSIS — R443 Hallucinations, unspecified: Secondary | ICD-10-CM | POA: Diagnosis not present

## 2021-12-25 DIAGNOSIS — E871 Hypo-osmolality and hyponatremia: Secondary | ICD-10-CM | POA: Diagnosis not present

## 2021-12-25 DIAGNOSIS — F4310 Post-traumatic stress disorder, unspecified: Secondary | ICD-10-CM | POA: Diagnosis not present

## 2021-12-25 DIAGNOSIS — G4700 Insomnia, unspecified: Secondary | ICD-10-CM | POA: Diagnosis not present

## 2021-12-25 DIAGNOSIS — Z79899 Other long term (current) drug therapy: Secondary | ICD-10-CM | POA: Diagnosis not present

## 2021-12-25 DIAGNOSIS — I739 Peripheral vascular disease, unspecified: Secondary | ICD-10-CM | POA: Diagnosis not present

## 2021-12-25 DIAGNOSIS — G8929 Other chronic pain: Secondary | ICD-10-CM | POA: Diagnosis not present

## 2021-12-25 DIAGNOSIS — R519 Headache, unspecified: Secondary | ICD-10-CM | POA: Diagnosis not present

## 2021-12-25 DIAGNOSIS — R9431 Abnormal electrocardiogram [ECG] [EKG]: Secondary | ICD-10-CM | POA: Diagnosis not present

## 2021-12-25 DIAGNOSIS — I10 Essential (primary) hypertension: Secondary | ICD-10-CM | POA: Diagnosis not present

## 2021-12-25 DIAGNOSIS — F17210 Nicotine dependence, cigarettes, uncomplicated: Secondary | ICD-10-CM | POA: Diagnosis not present

## 2021-12-25 DIAGNOSIS — R451 Restlessness and agitation: Secondary | ICD-10-CM | POA: Diagnosis not present

## 2021-12-25 DIAGNOSIS — F419 Anxiety disorder, unspecified: Secondary | ICD-10-CM | POA: Diagnosis not present

## 2021-12-26 DIAGNOSIS — F32A Depression, unspecified: Secondary | ICD-10-CM | POA: Diagnosis not present

## 2021-12-26 DIAGNOSIS — E871 Hypo-osmolality and hyponatremia: Secondary | ICD-10-CM | POA: Diagnosis not present

## 2021-12-26 DIAGNOSIS — F17210 Nicotine dependence, cigarettes, uncomplicated: Secondary | ICD-10-CM | POA: Diagnosis not present

## 2021-12-26 DIAGNOSIS — I10 Essential (primary) hypertension: Secondary | ICD-10-CM | POA: Diagnosis not present

## 2021-12-26 DIAGNOSIS — F419 Anxiety disorder, unspecified: Secondary | ICD-10-CM | POA: Diagnosis not present

## 2021-12-28 ENCOUNTER — Encounter: Payer: Self-pay | Admitting: Surgery

## 2021-12-28 ENCOUNTER — Other Ambulatory Visit: Payer: Self-pay

## 2021-12-28 ENCOUNTER — Ambulatory Visit (INDEPENDENT_AMBULATORY_CARE_PROVIDER_SITE_OTHER): Payer: Medicare HMO | Admitting: Surgery

## 2021-12-28 VITALS — BP 154/82 | HR 65 | Temp 98.4°F | Resp 20 | Ht 61.0 in | Wt 86.6 lb

## 2021-12-28 DIAGNOSIS — I70213 Atherosclerosis of native arteries of extremities with intermittent claudication, bilateral legs: Secondary | ICD-10-CM | POA: Diagnosis not present

## 2021-12-28 NOTE — Progress Notes (Signed)
[facility] [place]  Patient name: Lauren King MRN: [id number] DOB: May 05, 1951 Sex: female   REASON FOR VISIT:    Urgent add-on  [name]:   Lauren King is a 71 y.o. female who has undergone the following procedures:   12/27/2017: Abdominal aortogram 11/21/2018: Abdominal aortogram 12/22/2018: Left femoral to tibioperoneal trunk bypass with saphenous vein (rest pain) 09/11/2019: Angioplasty left tibioperoneal trunk and peroneal artery, stent left external iliac artery rest pain 10/23/2019: Mechanical thrombectomy right external iliac and common femoral artery with stenting of the right common femoral, external iliac, and common iliac artery (rest pain)   She called the office last weekend complaining of severe leg pain.  She had difficulty getting here because of transportation.  Since that time, she was diagnosed with low sodium.  She also had a nervous breakdown.  Both of these situations are improved.  Her rest pain is no longer present however she does have significant claudication at less than 100 feet.  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  She takes a statin for hypercholesterolemia.  She is on Plavix.  PAST MEDICAL HISTORY:   Past Medical History:  Diagnosis Date   Anxiety    Chronic lower back pain    COPD (chronic obstructive pulmonary disease) (HCC)    Depression    GERD (gastroesophageal reflux disease)    Obstructive sleep apnea    CPAP     FAMILY HISTORY:   Family History  Problem Relation Age of Onset   Lung cancer Sister    Lung cancer Brother    Colon cancer Neg Hx     SOCIAL HISTORY:   Social History   Tobacco Use   Smoking status: Some Days    Packs/day: 0.40    Years: 40.00    Total pack years: 16.00    Types: Cigarettes   Smokeless tobacco: Never   Tobacco comments:    5 a day  Substance Use Topics   Alcohol use: No      ALLERGIES:   No Known Allergies   CURRENT MEDICATIONS:   Current Outpatient Medications  Medication Sig Dispense Refill   albuterol (PROVENTIL) (2.5 MG/3ML) 0.083% nebulizer solution Take 2.5 mg by nebulization every 6 (six) hours as needed for wheezing or shortness of breath.      albuterol (VENTOLIN HFA) 108 (90 Base) MCG/ACT inhaler Inhale 2 puffs into the lungs every 6 (six) hours as needed for wheezing or shortness of breath.      aspirin EC 81 MG tablet Take 81 mg by mouth daily.     clopidogrel (PLAVIX) 75 MG tablet Take 1 tablet (75 mg total) by mouth daily. 30 tablet 6   ibuprofen (ADVIL) 800 MG tablet Take 800 mg by mouth every 8 (eight) hours as needed (back pain.).      losartan-hydrochlorothiazide (HYZAAR) 50-12.5 MG tablet Take 1 tablet by mouth daily.     mirtazapine (REMERON) 30 MG tablet Take 1 tablet (30 mg total) by mouth at bedtime. (Patient taking differently: Take 15-30 mg by mouth at bedtime as needed (sleep).) 90 tablet 1   oxyCODONE (OXY IR/ROXICODONE) 5 MG immediate release tablet SMARTSIG:1 Tablet(s) By Mouth 6 Times Daily PRN     OXYGEN Inhale 2 L into the lungs daily as needed (low oxygen).      rosuvastatin (CRESTOR) 5 MG tablet Take 1 tablet (5 mg total) by mouth daily. 30 tablet 3   traMADol (ULTRAM)  50 MG tablet Take 50 mg by mouth 2 (two) times daily as needed for moderate pain.      TRELEGY ELLIPTA 100-62.5-25 MCG/INH AEPB Inhale 1 puff into the lungs daily.     No current facility-administered medications for this visit.    REVIEW OF SYSTEMS:   [X]  denotes positive finding, [ ]  denotes negative finding Cardiac  Comments:  Chest pain or chest pressure:    Shortness of breath upon exertion:    Short of breath when lying flat:    Irregular heart rhythm:        Vascular    Pain in calf, thigh, or hip brought on by ambulation: x   Pain in feet at night that wakes you up from your sleep:     Blood clot in your veins:    Leg swelling:          Pulmonary    Oxygen at home:    Productive cough:     Wheezing:         Neurologic    Sudden weakness in arms or legs:     Sudden numbness in arms or legs:     Sudden onset of difficulty speaking or slurred speech:    Temporary loss of vision in one eye:     Problems with dizziness:         Gastrointestinal    Blood in stool:     Vomited blood:         Genitourinary    Burning when urinating:     Blood in urine:        Psychiatric    Major depression:         Hematologic    Bleeding problems:    Problems with blood clotting too easily:        Skin    Rashes or ulcers:        Constitutional    Fever or chills:      PHYSICAL EXAM:   Vitals:   12/28/21 0904  BP: (!) 154/82  Pulse: 65  Resp: 20  Temp: 98.4 F (36.9 C)  SpO2: 97%  Weight: 86 lb 9.6 oz (39.3 kg)  Height: 5\' 1"  (1.549 m)    GENERAL: The patient is a well-nourished female, in no acute distress. The vital signs are documented above. CARDIAC: There is a regular rate and rhythm.  VASCULAR: Nonpalpable pedal pulses PULMONARY: Non-labored respirations ABDOMEN: Soft and non-tender with normal pitched bowel sounds.  MUSCULOSKELETAL: There are no major deformities or cyanosis. NEUROLOGIC: No focal weakness or paresthesias are detected. SKIN: There are no ulcers or rashes noted. PSYCHIATRIC: The patient has a normal affect.  STUDIES:   None  MEDICAL ISSUES:   Severe claudication: I discussed with the patient that her prior ultrasounds do show recurrent stenoses within her interventions.  Because she is having issues and had a recent deterioration, I think we need to proceed with angiography.  This would be through a right femoral approach with bilateral runoff.  I will plan on treating her iliac stenosis and then contemplate trying to recanalize her tibioperoneal trunk which is known to be occluded below her bypass.  This will be scheduled on the first Tuesday of September.  All questions were  answered.    , MD, FACS [facility] [phone] Pager [phone]

## 2021-12-31 DIAGNOSIS — F29 Unspecified psychosis not due to a substance or known physiological condition: Secondary | ICD-10-CM | POA: Diagnosis not present

## 2022-01-01 ENCOUNTER — Encounter (HOSPITAL_COMMUNITY): Payer: Self-pay

## 2022-01-01 ENCOUNTER — Emergency Department (HOSPITAL_COMMUNITY)
Admission: EM | Admit: 2022-01-01 | Discharge: 2022-01-05 | Disposition: A | Discharge status: Home / Self Care | Payer: Medicare HMO | Attending: Emergency Medicine | Admitting: Emergency Medicine

## 2022-01-01 DIAGNOSIS — Z5321 Procedure and treatment not carried out due to patient leaving prior to being seen by health care provider: Secondary | ICD-10-CM | POA: Diagnosis not present

## 2022-01-01 DIAGNOSIS — E871 Hypo-osmolality and hyponatremia: Secondary | ICD-10-CM | POA: Diagnosis present

## 2022-01-01 DIAGNOSIS — Z7982 Long term (current) use of aspirin: Secondary | ICD-10-CM | POA: Diagnosis not present

## 2022-01-01 DIAGNOSIS — F22 Delusional disorders: Secondary | ICD-10-CM | POA: Insufficient documentation

## 2022-01-01 DIAGNOSIS — F419 Anxiety disorder, unspecified: Secondary | ICD-10-CM | POA: Diagnosis not present

## 2022-01-01 DIAGNOSIS — F1299 Cannabis use, unspecified with unspecified cannabis-induced disorder: Secondary | ICD-10-CM | POA: Diagnosis not present

## 2022-01-01 DIAGNOSIS — J441 Chronic obstructive pulmonary disease with (acute) exacerbation: Secondary | ICD-10-CM

## 2022-01-01 DIAGNOSIS — R059 Cough, unspecified: Secondary | ICD-10-CM | POA: Diagnosis not present

## 2022-01-01 DIAGNOSIS — N390 Urinary tract infection, site not specified: Secondary | ICD-10-CM | POA: Diagnosis present

## 2022-01-01 DIAGNOSIS — F17210 Nicotine dependence, cigarettes, uncomplicated: Secondary | ICD-10-CM | POA: Diagnosis not present

## 2022-01-01 DIAGNOSIS — Z720 Tobacco use: Secondary | ICD-10-CM | POA: Diagnosis not present

## 2022-01-01 DIAGNOSIS — F430 Acute stress reaction: Secondary | ICD-10-CM | POA: Diagnosis not present

## 2022-01-01 DIAGNOSIS — I10 Essential (primary) hypertension: Secondary | ICD-10-CM | POA: Diagnosis not present

## 2022-01-01 DIAGNOSIS — Z79899 Other long term (current) drug therapy: Secondary | ICD-10-CM | POA: Insufficient documentation

## 2022-01-01 DIAGNOSIS — J439 Emphysema, unspecified: Secondary | ICD-10-CM | POA: Diagnosis not present

## 2022-01-01 DIAGNOSIS — J449 Chronic obstructive pulmonary disease, unspecified: Secondary | ICD-10-CM
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
## 2022-01-01 NOTE — ED Notes (Signed)
Security to wand pt  

## 2022-01-01 NOTE — ED Triage Notes (Signed)
Pt to ED via POV with daughter c/o mental health problem. Hx schizophrenia, bipolar, depression, anxiety . Pt requesting help at this time. Reports seen recently for the same. Pt paranoid, and thinks people are out to get her. Tearful in triage

## 2022-01-02 DIAGNOSIS — F22 Delusional disorders: Secondary | ICD-10-CM | POA: Diagnosis not present

## 2022-01-02 LAB — CBC WITH DIFFERENTIAL/PLATELET
Abs Immature Granulocytes: 0.03 10*3/uL (ref 0.00–0.07)
Basophils Absolute: 0.1 10*3/uL (ref 0.0–0.1)
Basophils Relative: 1 %
Eosinophils Absolute: 0.2 10*3/uL (ref 0.0–0.5)
Eosinophils Relative: 2 %
HCT: 39.9 % (ref 36.0–46.0)
Hemoglobin: 13.8 g/dL (ref 12.0–15.0)
Immature Granulocytes: 0 %
Lymphocytes Relative: 30 %
Lymphs Abs: 3.1 10*3/uL (ref 0.7–4.0)
MCH: 31.7 pg (ref 26.0–34.0)
MCHC: 34.6 g/dL (ref 30.0–36.0)
MCV: 91.5 fL (ref 80.0–100.0)
Monocytes Absolute: 0.8 10*3/uL (ref 0.1–1.0)
Monocytes Relative: 8 %
Neutro Abs: 6.1 10*3/uL (ref 1.7–7.7)
Neutrophils Relative %: 59 %
Platelets: 428 10*3/uL — ABNORMAL HIGH (ref 150–400)
RBC: 4.36 MIL/uL (ref 3.87–5.11)
RDW: 13.2 % (ref 11.5–15.5)
WBC: 10.2 10*3/uL (ref 4.0–10.5)
nRBC: 0 % (ref 0.0–0.2)

## 2022-01-02 LAB — ETHANOL: Alcohol, Ethyl (B): <10 mg/dL (ref <10)

## 2022-01-02 LAB — COMPREHENSIVE METABOLIC PANEL
ALT: 28 U/L (ref 0–44)
AST: 40 U/L (ref 15–41)
Albumin: 4.1 g/dL (ref 3.5–5.0)
Alkaline Phosphatase: 75 U/L (ref 38–126)
Anion gap: 13 (ref 5–15)
BUN: 21 mg/dL (ref 8–23)
CO2: 21 mmol/L — ABNORMAL LOW (ref 22–32)
Calcium: 9.1 mg/dL (ref 8.9–10.3)
Chloride: 96 mmol/L — ABNORMAL LOW (ref 98–111)
Creatinine, Ser: 0.85 mg/dL (ref 0.44–1.00)
GFR, Estimated: >60 mL/min (ref >60)
Glucose, Bld: 92 mg/dL (ref 70–99)
Potassium: 3.9 mmol/L (ref 3.5–5.1)
Sodium: 130 mmol/L — ABNORMAL LOW (ref 135–145)
Total Bilirubin: 0.7 mg/dL (ref 0.3–1.2)
Total Protein: 7.4 g/dL (ref 6.5–8.1)

## 2022-01-02 LAB — RAPID URINE DRUG SCREEN, HOSP PERFORMED
Amphetamines: NOT DETECTED
Barbiturates: POSITIVE — AB
Benzodiazepines: NOT DETECTED
Cocaine: NOT DETECTED
Opiates: NOT DETECTED
Tetrahydrocannabinol: POSITIVE — AB

## 2022-01-02 MED ORDER — ASPIRIN 81 MG PO TBEC
81.0000 mg | DELAYED_RELEASE_TABLET | Freq: Every day | ORAL | Status: DC
  Administered 2022-01-03 – 2022-01-05 (×3): 81 mg via ORAL
  Filled 2022-01-02 (×3): qty 1

## 2022-01-02 MED ORDER — LORAZEPAM 1 MG PO TABS
1.0000 mg | ORAL_TABLET | Freq: Once | ORAL | Status: AC
Start: 2022-01-02 — End: 2022-01-02
  Administered 2022-01-02: 1 mg via ORAL
  Filled 2022-01-02: qty 1

## 2022-01-02 MED ORDER — MIRTAZAPINE 15 MG PO TABS
15.0000 mg | ORAL_TABLET | Freq: Every day | ORAL | Status: DC
  Administered 2022-01-02 – 2022-01-04 (×3): 15 mg via ORAL
  Filled 2022-01-02 (×3): qty 1

## 2022-01-02 MED ORDER — NICOTINE 14 MG/24HR TD PT24
14.0000 mg | MEDICATED_PATCH | Freq: Once | TRANSDERMAL | Status: AC
  Administered 2022-01-02: 14 mg via TRANSDERMAL
  Filled 2022-01-02: qty 1

## 2022-01-02 MED ORDER — OXYCODONE HCL 5 MG PO TABS
5.0000 mg | ORAL_TABLET | Freq: Once | ORAL | Status: AC
  Administered 2022-01-02: 5 mg via ORAL
  Filled 2022-01-02: qty 1

## 2022-01-02 MED ORDER — OXYCODONE HCL 5 MG PO TABS
5.0000 mg | ORAL_TABLET | Freq: Four times a day (QID) | ORAL | Status: DC | PRN
  Administered 2022-01-02 – 2022-01-05 (×7): 5 mg via ORAL
  Filled 2022-01-02 (×7): qty 1

## 2022-01-02 MED ORDER — ALBUTEROL SULFATE HFA 108 (90 BASE) MCG/ACT IN AERS
2.0000 | INHALATION_SPRAY | Freq: Once | RESPIRATORY_TRACT | Status: AC
  Administered 2022-01-02 – 2022-01-03 (×2): 2 via RESPIRATORY_TRACT
  Filled 2022-01-02: qty 6.7

## 2022-01-02 MED ORDER — LOSARTAN POTASSIUM 25 MG PO TABS
50.0000 mg | ORAL_TABLET | Freq: Every day | ORAL | Status: DC
  Administered 2022-01-03 – 2022-01-05 (×3): 50 mg via ORAL
  Filled 2022-01-02 (×3): qty 2

## 2022-01-02 MED ORDER — IPRATROPIUM BROMIDE HFA 17 MCG/ACT IN AERS
2.0000 | INHALATION_SPRAY | Freq: Once | RESPIRATORY_TRACT | Status: DC
Start: 2022-01-02 — End: 2022-01-02
  Filled 2022-01-02: qty 12.9

## 2022-01-02 MED ORDER — TRAMADOL HCL 50 MG PO TABS
50.0000 mg | ORAL_TABLET | Freq: Once | ORAL | Status: AC
  Administered 2022-01-02: 50 mg via ORAL
  Filled 2022-01-02: qty 1

## 2022-01-02 MED ORDER — ACETAMINOPHEN 500 MG PO TABS
1000.0000 mg | ORAL_TABLET | Freq: Once | ORAL | Status: AC
  Administered 2022-01-02: 1000 mg via ORAL
  Filled 2022-01-02: qty 2

## 2022-01-02 MED ORDER — CLOPIDOGREL BISULFATE 75 MG PO TABS
75.0000 mg | ORAL_TABLET | Freq: Every day | ORAL | Status: DC
  Administered 2022-01-03 – 2022-01-05 (×3): 75 mg via ORAL
  Filled 2022-01-02 (×3): qty 1

## 2022-01-02 MED ORDER — IPRATROPIUM BROMIDE 0.02 % IN SOLN
2.5000 mL | Freq: Once | RESPIRATORY_TRACT | Status: DC
Start: 2022-01-02 — End: 2022-01-02
  Filled 2022-01-02: qty 2.5

## 2022-01-02 MED ORDER — THIAMINE HCL 100 MG PO TABS
100.0000 mg | ORAL_TABLET | Freq: Every day | ORAL | Status: DC
  Administered 2022-01-03 – 2022-01-05 (×3): 100 mg via ORAL
  Filled 2022-01-02 (×7): qty 1

## 2022-01-02 NOTE — ED Notes (Signed)
Pt in a room for TTS at this time.
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
## 2022-01-02 NOTE — ED Notes (Signed)
Pt sitter to nurses station stating pt is wanting to leave. This nurse went to speak to pt and advised she wants to leave. She has not thing to do and needs something to do to use her hands. Provided pt with pillow cases and wash cloths to fold. Pt declined snack or drink. Sitter remains at bedside.

## 2022-01-02 NOTE — ED Notes (Signed)
Pt declined nicotine patch and stated she just wants to get out of here and go home to crochet a blanket and watch tv

## 2022-01-02 NOTE — ED Notes (Signed)
Lunch provided to pt at this time.
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
## 2022-01-02 NOTE — ED Notes (Signed)
Pt ambulated to the bathroom unassisted with a steady gait.

## 2022-01-02 NOTE — ED Notes (Signed)
Pt sitting calmly coloring

## 2022-01-02 NOTE — ED Notes (Signed)
Pt on phone speaking to family member

## 2022-01-03 DIAGNOSIS — F22 Delusional disorders: Secondary | ICD-10-CM

## 2022-01-03 DIAGNOSIS — E871 Hypo-osmolality and hyponatremia: Secondary | ICD-10-CM | POA: Diagnosis present

## 2022-01-03 DIAGNOSIS — N390 Urinary tract infection, site not specified: Secondary | ICD-10-CM | POA: Diagnosis present

## 2022-01-03 LAB — URINALYSIS, COMPLETE (UACMP) WITH MICROSCOPIC
Bilirubin Urine: NEGATIVE
Glucose, UA: NEGATIVE mg/dL
Ketones, ur: 20 mg/dL — AB
Nitrite: NEGATIVE
Protein, ur: NEGATIVE mg/dL
Specific Gravity, Urine: 1.008 (ref 1.005–1.030)
WBC, UA: >50 WBC/hpf — ABNORMAL HIGH (ref 0–5)
pH: 6 (ref 5.0–8.0)

## 2022-01-03 LAB — CBC WITH DIFFERENTIAL/PLATELET
Abs Immature Granulocytes: 0.04 10*3/uL (ref 0.00–0.07)
Basophils Absolute: 0.1 10*3/uL (ref 0.0–0.1)
Basophils Relative: 1 %
Eosinophils Absolute: 0.3 10*3/uL (ref 0.0–0.5)
Eosinophils Relative: 3 %
HCT: 40.4 % (ref 36.0–46.0)
Hemoglobin: 14 g/dL (ref 12.0–15.0)
Immature Granulocytes: 0 %
Lymphocytes Relative: 30 %
Lymphs Abs: 3.2 10*3/uL (ref 0.7–4.0)
MCH: 31.5 pg (ref 26.0–34.0)
MCHC: 34.7 g/dL (ref 30.0–36.0)
MCV: 91 fL (ref 80.0–100.0)
Monocytes Absolute: 0.9 10*3/uL (ref 0.1–1.0)
Monocytes Relative: 9 %
Neutro Abs: 6 10*3/uL (ref 1.7–7.7)
Neutrophils Relative %: 57 %
Platelets: 359 10*3/uL (ref 150–400)
RBC: 4.44 MIL/uL (ref 3.87–5.11)
RDW: 13.1 % (ref 11.5–15.5)
WBC: 10.4 10*3/uL (ref 4.0–10.5)
nRBC: 0 % (ref 0.0–0.2)

## 2022-01-03 LAB — COMPREHENSIVE METABOLIC PANEL
ALT: 31 U/L (ref 0–44)
AST: 45 U/L — ABNORMAL HIGH (ref 15–41)
Albumin: 4.4 g/dL (ref 3.5–5.0)
Alkaline Phosphatase: 85 U/L (ref 38–126)
Anion gap: 13 (ref 5–15)
BUN: 16 mg/dL (ref 8–23)
CO2: 23 mmol/L (ref 22–32)
Calcium: 9.5 mg/dL (ref 8.9–10.3)
Chloride: 96 mmol/L — ABNORMAL LOW (ref 98–111)
Creatinine, Ser: 0.59 mg/dL (ref 0.44–1.00)
GFR, Estimated: >60 mL/min (ref >60)
Glucose, Bld: 101 mg/dL — ABNORMAL HIGH (ref 70–99)
Potassium: 3.9 mmol/L (ref 3.5–5.1)
Sodium: 132 mmol/L — ABNORMAL LOW (ref 135–145)
Total Bilirubin: 0.9 mg/dL (ref 0.3–1.2)
Total Protein: 7.7 g/dL (ref 6.5–8.1)

## 2022-01-03 MED ORDER — CEPHALEXIN 500 MG PO CAPS
500.0000 mg | ORAL_CAPSULE | Freq: Three times a day (TID) | ORAL | Status: DC
  Administered 2022-01-03 – 2022-01-05 (×6): 500 mg via ORAL
  Filled 2022-01-03 (×6): qty 1

## 2022-01-03 MED ORDER — ALBUTEROL SULFATE HFA 108 (90 BASE) MCG/ACT IN AERS
2.0000 | INHALATION_SPRAY | RESPIRATORY_TRACT | Status: DC | PRN
  Administered 2022-01-03 – 2022-01-05 (×3): 2 via RESPIRATORY_TRACT
  Filled 2022-01-03: qty 6.7
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
## 2022-01-03 NOTE — ED Notes (Signed)
Pt c/o back pain at this time.

## 2022-01-03 NOTE — ED Notes (Addendum)
Pt has been cooperative today but still endorsing anxiety and inability to "keep still" (slept <1hr overnight), constant repetitive and pressured speech and preoccupation with belief that her family is conspiring against her and lying to keep her under IVC. She was told during TTS that she was not being discharged but will stay here pending further obs/testing/contact with family. Her understanding of this is that she WILL be d/c and her daughter will be IVC'd instead.

## 2022-01-03 NOTE — ED Provider Notes (Signed)
Emergency Medicine Observation Re-evaluation Note  Lauren King is a 71 y.o. female, seen on rounds today.  Pt initially presented to the ED for complaints of Mental Health Problem Currently, the patient is calm.  Pt was seen by psych yesterday.  Her meds were restarted.  She feels better.  Physical Exam  BP (!) 154/72 (BP Location: Left Arm)   Pulse 78   Temp 97.9 F (36.6 C)   Resp 20   Ht 5\' 1"  (1.549 m)   Wt 39.9 kg   SpO2 98%   BMI 16.63 kg/m  Physical Exam General: awake and alert Cardiac: rr Lungs: clear Psych: calm  ED Course / MDM  EKG:   I have reviewed the labs performed to date as well as medications administered while in observation.  Recent changes in the last 24 hours include psych assessment with rec to restart meds.    Plan  Current plan is for reassess today.  DAJAI WAHLERT is not under involuntary commitment.     [name], MD 01/03/22 [phone]

## 2022-01-04 DIAGNOSIS — F22 Delusional disorders: Secondary | ICD-10-CM | POA: Diagnosis not present

## 2022-01-04 LAB — COMPREHENSIVE METABOLIC PANEL
ALT: 28 U/L (ref 0–44)
AST: 39 U/L (ref 15–41)
Albumin: 3.7 g/dL (ref 3.5–5.0)
Alkaline Phosphatase: 73 U/L (ref 38–126)
Anion gap: 7 (ref 5–15)
BUN: 14 mg/dL (ref 8–23)
CO2: 26 mmol/L (ref 22–32)
Calcium: 9 mg/dL (ref 8.9–10.3)
Chloride: 102 mmol/L (ref 98–111)
Creatinine, Ser: 0.58 mg/dL (ref 0.44–1.00)
GFR, Estimated: >60 mL/min (ref >60)
Glucose, Bld: 148 mg/dL — ABNORMAL HIGH (ref 70–99)
Potassium: 4 mmol/L (ref 3.5–5.1)
Sodium: 135 mmol/L (ref 135–145)
Total Bilirubin: 0.5 mg/dL (ref 0.3–1.2)
Total Protein: 6.6 g/dL (ref 6.5–8.1)

## 2022-01-04 LAB — URINE CULTURE: Culture: NO GROWTH

## 2022-01-04 NOTE — ED Notes (Addendum)
Pt was accepted to [facility] 01/05/22; [facility]  Pt meets inpatient criteria per Alan Mulder, NP  Attending Physician will be Dr. Roselyn Reef   Report can be called to: Adult unit: [phone]  Pt can arrive after 9:00am  Care Team notified: Regan Lemming, RN
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
## 2022-01-04 NOTE — ED Notes (Signed)
Pt resting at this time.

## 2022-01-04 NOTE — ED Notes (Signed)
Pt care taken, pt resting, has her grandson at bedside.

## 2022-01-04 NOTE — Progress Notes (Addendum)
Pt under review at [facility] Team notified: Regan Lemming, RN  Kelton Pillar, LCSWA 01/04/2022 @ 8:53 PM

## 2022-01-04 NOTE — Progress Notes (Signed)
Inpatient Behavioral Health Placement  Pt meets inpatient criteria per Alan Mulder, NP.  There are no available beds at [facility]  and no appropriate beds at [facility]. Referral was sent to the following facilities;  Destination Service Provider Address Phone Fax  [facility]  [address] [place] [address] [phone]  [facility] [address]  [address] [place] [address] [phone]  [facility]  [address] [place], [place] [address] [phone]  [facility]  [address] [place] [address] [phone]  [facility]  [address] [place], [place] [address] [phone]  [facility]  [address] [place] [address] [phone]  [facility]  [address], [place] [address] [phone]  [facility]  [address] [place] [address] [phone]  [facility]  [address], [place] [address] [phone]  [facility]  [address] [place] [address] [phone]  [facility]  [address] [place] [address] [phone]  [facility]  [address], [place] [address] [phone]  [facility]  [address], [place] [address] [phone]  [facility]  [address] [place] [address] [phone]  [facility]  [address] [place] [address] [phone]  [facility]  [address], [place] [address] [phone]     Situation ongoing,  CSW will  follow up.   Maryjean Ka, MSW, [facility] 01/04/2022  @ 8:04 PM

## 2022-01-05 DIAGNOSIS — E785 Hyperlipidemia, unspecified: Secondary | ICD-10-CM | POA: Diagnosis not present

## 2022-01-05 DIAGNOSIS — G4730 Sleep apnea, unspecified: Secondary | ICD-10-CM | POA: Diagnosis not present

## 2022-01-05 DIAGNOSIS — I10 Essential (primary) hypertension: Secondary | ICD-10-CM | POA: Diagnosis not present

## 2022-01-05 DIAGNOSIS — J449 Chronic obstructive pulmonary disease, unspecified: Secondary | ICD-10-CM | POA: Diagnosis not present

## 2022-01-05 DIAGNOSIS — J441 Chronic obstructive pulmonary disease with (acute) exacerbation: Secondary | ICD-10-CM

## 2022-01-05 DIAGNOSIS — Z7982 Long term (current) use of aspirin: Secondary | ICD-10-CM | POA: Diagnosis not present

## 2022-01-05 DIAGNOSIS — Z91148 Patient's other noncompliance with medication regimen for other reason: Secondary | ICD-10-CM | POA: Diagnosis not present

## 2022-01-05 DIAGNOSIS — F4310 Post-traumatic stress disorder, unspecified: Secondary | ICD-10-CM | POA: Diagnosis not present

## 2022-01-05 DIAGNOSIS — Z79899 Other long term (current) drug therapy: Secondary | ICD-10-CM | POA: Diagnosis not present

## 2022-01-05 DIAGNOSIS — F22 Delusional disorders: Secondary | ICD-10-CM | POA: Diagnosis not present

## 2022-01-05 DIAGNOSIS — F209 Schizophrenia, unspecified: Secondary | ICD-10-CM | POA: Diagnosis not present

## 2022-01-05 DIAGNOSIS — F250 Schizoaffective disorder, bipolar type: Secondary | ICD-10-CM | POA: Diagnosis not present

## 2022-01-05 DIAGNOSIS — K219 Gastro-esophageal reflux disease without esophagitis: Secondary | ICD-10-CM | POA: Diagnosis not present
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
## 2022-01-05 NOTE — ED Provider Notes (Signed)
Emergency Medicine Observation Re-evaluation Note  Lauren King is a 71 y.o. female, seen on rounds today.  Pt initially presented to the ED for complaints of Mental Health Problem Currently, the patient is upset about finding that she is going to old [place].  Physical Exam  BP (!) 121/56 (BP Location: Left Arm)   Pulse 74   Temp 97.6 F (36.4 C)   Resp 18   Ht 5\' 1"  (1.549 m)   Wt 39.9 kg   SpO2 98%   BMI 16.63 kg/m  Physical Exam General: Tearful Lungs: Normal effort Psych: Delusional  ED Course / MDM  EKG:   I have reviewed the labs performed to date as well as medications administered while in observation. No recent changes in the last 24 hours.  Plan  Current plan is for transfer and admission to [facility] [place], MD 01/05/22 [phone]

## 2022-01-12 ENCOUNTER — Ambulatory Visit (HOSPITAL_COMMUNITY)
Admission: RE | Admit: 2022-01-12 | Discharge: 2022-01-12 | Disposition: A | Discharge status: Home / Self Care | Payer: Medicare HMO | Source: Ambulatory Visit | Attending: Surgery | Admitting: Surgery

## 2022-01-12 ENCOUNTER — Telehealth: Payer: Self-pay

## 2022-01-12 ENCOUNTER — Encounter (HOSPITAL_COMMUNITY)
Admission: RE | Disposition: A | Discharge status: Home / Self Care | Payer: Self-pay | Source: Ambulatory Visit | Attending: Surgery

## 2022-01-12 DIAGNOSIS — I70213 Atherosclerosis of native arteries of extremities with intermittent claudication, bilateral legs: Secondary | ICD-10-CM | POA: Diagnosis not present

## 2022-01-12 DIAGNOSIS — Z539 Procedure and treatment not carried out, unspecified reason: Secondary | ICD-10-CM | POA: Insufficient documentation

## 2022-01-12 SURGERY — ABDOMINAL AORTOGRAM W/LOWER EXTREMITY
Anesthesia: LOCAL

## 2022-01-12 MED ORDER — SODIUM CHLORIDE 0.9 % IV SOLN: INTRAVENOUS | Status: DC

## 2022-01-12 NOTE — Progress Notes (Signed)
Pt was late due to flat tire, when arrived late she stated she was just discharged yesterday from the hospital with cough, pt requested to reschedule

## 2022-01-12 NOTE — Telephone Encounter (Signed)
        Patient  visited [place] on 8/29     Telephone encounter attempt : 1ST    Unable to Leave Message   Lenard Forth [facility] Guide, [facility], Care Management  [phone] [address] [place], [place], [place] [address] Phone: [phone] Email: [email or web address]

## 2022-01-14 ENCOUNTER — Other Ambulatory Visit: Payer: Self-pay

## 2022-01-14 DIAGNOSIS — I70213 Atherosclerosis of native arteries of extremities with intermittent claudication, bilateral legs: Secondary | ICD-10-CM

## 2022-01-16 DIAGNOSIS — I10 Essential (primary) hypertension: Secondary | ICD-10-CM | POA: Diagnosis not present

## 2022-01-16 DIAGNOSIS — J439 Emphysema, unspecified: Secondary | ICD-10-CM | POA: Diagnosis not present

## 2022-01-16 DIAGNOSIS — U071 COVID-19: Secondary | ICD-10-CM | POA: Diagnosis not present

## 2022-01-16 DIAGNOSIS — Z859 Personal history of malignant neoplasm, unspecified: Secondary | ICD-10-CM | POA: Diagnosis not present

## 2022-01-16 DIAGNOSIS — J441 Chronic obstructive pulmonary disease with (acute) exacerbation: Secondary | ICD-10-CM | POA: Diagnosis not present

## 2022-01-16 DIAGNOSIS — F419 Anxiety disorder, unspecified: Secondary | ICD-10-CM | POA: Diagnosis not present

## 2022-01-16 DIAGNOSIS — F17210 Nicotine dependence, cigarettes, uncomplicated: Secondary | ICD-10-CM | POA: Diagnosis not present

## 2022-01-16 DIAGNOSIS — Z7902 Long term (current) use of antithrombotics/antiplatelets: Secondary | ICD-10-CM | POA: Diagnosis not present

## 2022-01-16 DIAGNOSIS — R059 Cough, unspecified: Secondary | ICD-10-CM | POA: Diagnosis not present

## 2022-01-16 DIAGNOSIS — Z79899 Other long term (current) drug therapy: Secondary | ICD-10-CM | POA: Diagnosis not present

## 2022-01-16 DIAGNOSIS — Z720 Tobacco use: Secondary | ICD-10-CM | POA: Diagnosis not present

## 2022-01-18 ENCOUNTER — Telehealth: Payer: Self-pay

## 2022-01-18 DIAGNOSIS — F339 Major depressive disorder, recurrent, unspecified: Secondary | ICD-10-CM | POA: Diagnosis not present

## 2022-01-18 DIAGNOSIS — F17200 Nicotine dependence, unspecified, uncomplicated: Secondary | ICD-10-CM | POA: Diagnosis not present

## 2022-01-18 DIAGNOSIS — M1990 Unspecified osteoarthritis, unspecified site: Secondary | ICD-10-CM | POA: Diagnosis not present

## 2022-01-18 DIAGNOSIS — J449 Chronic obstructive pulmonary disease, unspecified: Secondary | ICD-10-CM | POA: Diagnosis not present

## 2022-01-18 NOTE — Telephone Encounter (Signed)
Pt called stating that she had what looked like an "egg" on either side of her ankle on her L foot.  Reviewed pt's chart, returned call for clarification, two identifiers used. Pt stated that Dr Lauren King had seen it and she's been having to walk more d/t wheelchair issues. Pt instructed to elevate her foot above her heart as much as possible. She will go to ED if the swelling does not improve. Confirmed understanding.

## 2022-01-19 ENCOUNTER — Ambulatory Visit (HOSPITAL_COMMUNITY): Admission: RE | Admit: 2022-01-19 | Payer: Medicare HMO | Source: Home / Self Care | Admitting: Surgery

## 2022-01-19 ENCOUNTER — Encounter (HOSPITAL_COMMUNITY): Admission: RE | Payer: Self-pay | Source: Home / Self Care

## 2022-01-19 SURGERY — ABDOMINAL AORTOGRAM W/LOWER EXTREMITY
Anesthesia: LOCAL

## 2022-01-21 DIAGNOSIS — M069 Rheumatoid arthritis, unspecified: Secondary | ICD-10-CM | POA: Diagnosis not present

## 2022-02-02 ENCOUNTER — Other Ambulatory Visit: Payer: Self-pay

## 2022-02-02 ENCOUNTER — Ambulatory Visit (HOSPITAL_COMMUNITY)
Admission: RE | Admit: 2022-02-02 | Discharge: 2022-02-02 | Disposition: A | Discharge status: Home / Self Care | Payer: Medicare HMO | Attending: Surgery | Admitting: Surgery

## 2022-02-02 ENCOUNTER — Encounter (HOSPITAL_COMMUNITY)
Admission: RE | Disposition: A | Discharge status: Home / Self Care | Payer: Self-pay | Source: Home / Self Care | Attending: Surgery

## 2022-02-02 DIAGNOSIS — E7800 Pure hypercholesterolemia, unspecified: Secondary | ICD-10-CM | POA: Diagnosis not present

## 2022-02-02 DIAGNOSIS — Z7902 Long term (current) use of antithrombotics/antiplatelets: Secondary | ICD-10-CM | POA: Diagnosis not present

## 2022-02-02 DIAGNOSIS — F17210 Nicotine dependence, cigarettes, uncomplicated: Secondary | ICD-10-CM | POA: Insufficient documentation

## 2022-02-02 DIAGNOSIS — J449 Chronic obstructive pulmonary disease, unspecified: Secondary | ICD-10-CM | POA: Insufficient documentation

## 2022-02-02 DIAGNOSIS — M79604 Pain in right leg: Secondary | ICD-10-CM | POA: Diagnosis not present

## 2022-02-02 DIAGNOSIS — M79605 Pain in left leg: Secondary | ICD-10-CM | POA: Diagnosis not present

## 2022-02-02 DIAGNOSIS — I70223 Atherosclerosis of native arteries of extremities with rest pain, bilateral legs: Secondary | ICD-10-CM | POA: Diagnosis not present

## 2022-02-02 HISTORY — PX: ABDOMINAL AORTOGRAM W/LOWER EXTREMITY: CATH118223

## 2022-02-02 HISTORY — PX: PERIPHERAL VASCULAR BALLOON ANGIOPLASTY: CATH118281

## 2022-02-02 LAB — POCT I-STAT, CHEM 8
BUN: 11 mg/dL (ref 8–23)
Calcium, Ion: 1.17 mmol/L (ref 1.15–1.40)
Chloride: 97 mmol/L — ABNORMAL LOW (ref 98–111)
Creatinine, Ser: 0.5 mg/dL (ref 0.44–1.00)
Glucose, Bld: 135 mg/dL — ABNORMAL HIGH (ref 70–99)
HCT: 34 % — ABNORMAL LOW (ref 36.0–46.0)
Hemoglobin: 11.6 g/dL — ABNORMAL LOW (ref 12.0–15.0)
Potassium: 3.4 mmol/L — ABNORMAL LOW (ref 3.5–5.1)
Sodium: 134 mmol/L — ABNORMAL LOW (ref 135–145)
TCO2: 24 mmol/L (ref 22–32)

## 2022-02-02 LAB — POCT ACTIVATED CLOTTING TIME: Activated Clotting Time: 155 seconds

## 2022-02-02 SURGERY — ABDOMINAL AORTOGRAM W/LOWER EXTREMITY
Anesthesia: LOCAL

## 2022-02-02 MED ORDER — SODIUM CHLORIDE 0.9 % IV SOLN: INTRAVENOUS | Status: DC

## 2022-02-02 MED ORDER — FENTANYL CITRATE (PF) 100 MCG/2ML IJ SOLN
INTRAMUSCULAR | Status: DC | PRN
  Administered 2022-02-02: 25 ug via INTRAVENOUS
  Administered 2022-02-02: 50 ug via INTRAVENOUS

## 2022-02-02 MED ORDER — CLOPIDOGREL BISULFATE 75 MG PO TABS: 75.0000 mg | ORAL_TABLET | Freq: Every day | ORAL | Status: DC

## 2022-02-02 MED ORDER — LIDOCAINE HCL (PF) 1 % IJ SOLN
INTRAMUSCULAR | Status: AC
  Filled 2022-02-02: qty 30

## 2022-02-02 MED ORDER — ASPIRIN 81 MG PO TBEC: 81.0000 mg | DELAYED_RELEASE_TABLET | Freq: Every day | ORAL | Status: DC

## 2022-02-02 MED ORDER — MIDAZOLAM HCL 2 MG/2ML IJ SOLN
INTRAMUSCULAR | Status: AC
  Filled 2022-02-02: qty 2

## 2022-02-02 MED ORDER — SODIUM CHLORIDE 0.9 % IV SOLN: 250.0000 mL | INTRAVENOUS | Status: DC | PRN

## 2022-02-02 MED ORDER — IODIXANOL 320 MG/ML IV SOLN
INTRAVENOUS | Status: DC | PRN
  Administered 2022-02-02: 105 mL via INTRA_ARTERIAL

## 2022-02-02 MED ORDER — ACETAMINOPHEN 325 MG PO TABS: 650.0000 mg | ORAL_TABLET | ORAL | Status: DC | PRN

## 2022-02-02 MED ORDER — HYDRALAZINE HCL 20 MG/ML IJ SOLN: 5.0000 mg | INTRAMUSCULAR | Status: DC | PRN

## 2022-02-02 MED ORDER — SODIUM CHLORIDE 0.9% FLUSH: 3.0000 mL | INTRAVENOUS | Status: DC | PRN

## 2022-02-02 MED ORDER — MIDAZOLAM HCL 2 MG/2ML IJ SOLN
INTRAMUSCULAR | Status: DC | PRN
  Administered 2022-02-02 (×2): 1 mg via INTRAVENOUS

## 2022-02-02 MED ORDER — SODIUM CHLORIDE 0.9 % WEIGHT BASED INFUSION: 1.0000 mL/kg/h | INTRAVENOUS | Status: DC

## 2022-02-02 MED ORDER — MORPHINE SULFATE (PF) 2 MG/ML IV SOLN
2.0000 mg | INTRAVENOUS | Status: DC | PRN
  Administered 2022-02-02: 2 mg via INTRAVENOUS
  Filled 2022-02-02: qty 1

## 2022-02-02 MED ORDER — HEPARIN SODIUM (PORCINE) 1000 UNIT/ML IJ SOLN
INTRAMUSCULAR | Status: DC | PRN
  Administered 2022-02-02: 4000 [IU] via INTRAVENOUS

## 2022-02-02 MED ORDER — OXYCODONE HCL 5 MG PO TABS: 5.0000 mg | ORAL_TABLET | ORAL | Status: DC | PRN

## 2022-02-02 MED ORDER — LIDOCAINE HCL (PF) 1 % IJ SOLN
INTRAMUSCULAR | Status: DC | PRN
  Administered 2022-02-02: 20 mg

## 2022-02-02 MED ORDER — SODIUM CHLORIDE 0.9% FLUSH: 3.0000 mL | Freq: Two times a day (BID) | INTRAVENOUS | Status: DC

## 2022-02-02 MED ORDER — HEPARIN (PORCINE) IN NACL 1000-0.9 UT/500ML-% IV SOLN
INTRAVENOUS | Status: DC | PRN
  Administered 2022-02-02 (×2): 500 mL

## 2022-02-02 MED ORDER — HEPARIN SODIUM (PORCINE) 1000 UNIT/ML IJ SOLN
INTRAMUSCULAR | Status: AC
  Filled 2022-02-02: qty 10

## 2022-02-02 MED ORDER — LABETALOL HCL 5 MG/ML IV SOLN: 10.0000 mg | INTRAVENOUS | Status: DC | PRN

## 2022-02-02 MED ORDER — HEPARIN (PORCINE) IN NACL 1000-0.9 UT/500ML-% IV SOLN
INTRAVENOUS | Status: AC
  Filled 2022-02-02: qty 1000

## 2022-02-02 MED ORDER — ONDANSETRON HCL 4 MG/2ML IJ SOLN: 4.0000 mg | Freq: Four times a day (QID) | INTRAMUSCULAR | Status: DC | PRN

## 2022-02-02 MED ORDER — FENTANYL CITRATE (PF) 100 MCG/2ML IJ SOLN
INTRAMUSCULAR | Status: AC
  Filled 2022-02-02: qty 2

## 2022-02-02 SURGICAL SUPPLY — 21 items
BAG SNAP BAND KOVER 36X36 (MISCELLANEOUS) IMPLANT
BALLN MUSTANG 5.0X40 75 (BALLOONS) ×4
BALLN STERLING OTW 3X100X150 (BALLOONS) ×2
BALLOON MUSTANG 5.0X40 75 (BALLOONS) IMPLANT
BALLOON STERLING OTW 3X100X150 (BALLOONS) IMPLANT
CATH OMNI FLUSH 5F 65CM (CATHETERS) IMPLANT
COVER DOME SNAP 22 D (MISCELLANEOUS) IMPLANT
GUIDEWIRE ANGLED .035X150CM (WIRE) IMPLANT
KIT ENCORE 26 ADVANTAGE (KITS) IMPLANT
KIT MICROPUNCTURE NIT STIFF (SHEATH) IMPLANT
KIT PV (KITS) ×2 IMPLANT
PROTECTION STATION PRESSURIZED (MISCELLANEOUS) ×2
SET ATX SIMPLICITY (MISCELLANEOUS) IMPLANT
SHEATH DESTINATION MP 5FR 45CM (SHEATH) IMPLANT
SHEATH PINNACLE 5F 10CM (SHEATH) IMPLANT
SHEATH PINNACLE R/O II 5F 6CM (SHEATH) IMPLANT
SHEATH PROBE COVER 6X72 (BAG) IMPLANT
STATION PROTECTION PRESSURIZED (MISCELLANEOUS) IMPLANT
TRAY PV CATH (CUSTOM PROCEDURE TRAY) ×2 IMPLANT
WIRE BENTSON .035X145CM (WIRE) IMPLANT
WIRE G V18X300CM (WIRE) IMPLANT

## 2022-02-02 NOTE — Progress Notes (Signed)
Pure wick applied after peri care completed.

## 2022-02-02 NOTE — Op Note (Signed)
Patient name: Lauren King MRN: [id number] DOB: 03-13-1951 Sex: female  02/02/2022 Pre-operative Diagnosis: Bilateral leg pain Post-operative diagnosis:  Same Surgeon:  [name] Procedure Performed:  1.  Ultrasound-guided access, right femoral artery  2.  Abdominal aortogram  3.  Bilateral lower extremity runoff  4.  Angioplasty, left popliteal and peroneal artery  5.  Angioplasty, right common femoral artery  6.  Conscious sedation, 53 minutes   Indications: This is a 71 year old female with history of multiple prior interventions who has had progressively worse leg pain.  She is here for angiography.  Procedure:  The patient was identified in the holding area and taken to room 8.  The patient was then placed supine on the table and prepped and draped in the usual sterile fashion.  A time out was called.  Conscious sedation was administered with the use of IV fentanyl and Versed under continuous physician and nurse monitoring.  Heart rate, blood pressure, and oxygen saturation were continuously monitored.  Total sedation time was 53 minutes.  Ultrasound was used to evaluate the right common femoral artery.  It was patent .  A digital ultrasound image was acquired.  A micropuncture needle was used to access the right common femoral artery under ultrasound guidance.  An 018 wire was advanced without resistance and a micropuncture sheath was placed.  The 018 wire was removed and a benson wire was placed.  The micropuncture sheath was exchanged for a 5 french sheath.  An omniflush catheter was advanced over the wire to the level of L-1.  An abdominal angiogram was obtained.  Next, using the omniflush catheter and a benson wire, the aortic bifurcation was crossed and the catheter was placed into theleft external iliac artery and left runoff was obtained.  right runoff was performed via retrograde sheath injections.  Findings:   Aortogram: No significant renal artery stenosis was  visualized.  The infrarenal abdominal aorta is widely patent.  The left common and external iliac arteries are patent without significant stenosis.  The external iliac stent is widely patent.  On the right the common iliac and external iliac arteries with associated stents are patent however at the distal edge of the right external iliac stent there does appear to be a high-grade stenosis within the common femoral artery.  Right Lower Extremity: High-grade nearly occlusive stenosis within the right common femoral artery.  The profundofemoral artery is patent throughout its course.  The superficial femoral artery is occluded with reconstitution of the above-knee popliteal artery with single-vessel runoff via the peroneal artery  Left Lower Extremity: The left common femoral and profundofemoral artery are patent there is a stenosis at the origin of the profundofemoral artery.  There is a bypass graft originating from the common femoral artery down to the below-knee popliteal artery which is widely patent.  At the distal anastomosis, there is occlusion of the runoff vessels.  The bypass graft is preserved by retrograde filling of the popliteal artery.  There is delayed reconstitution of the peroneal and posterior tibial artery  Intervention: After the above images were acquired the decision made to proceed with intervention.  A 5 French 45 cm sheath was advanced into the bypass graft.  The patient is fully heparinized.  Next using a V-18 wire and a 3 x 100 Sterling balloon, I was able to advance the wire across the occluded distal anastomosis into the peroneal artery.  Primary balloon angioplasty was then performed in the below-knee popliteal artery and peroneal  artery nominal pressure for 2 minutes.  Completion imaging revealed inline flow through the peroneal artery.  Next attention was turned towards the right leg.  There did appear to be a nearly occluded common femoral artery.  I inserted a 5 x 40 Mustang  balloon and performed balloon angioplasty within the external iliac stent as well as the right common femoral artery at nominal pressure for 2 minutes.  Completion imaging showed inline flow through the common femoral artery with resolution of the stenosis.  Manual pressure was held for hemostasis.  Impression:  #1  Left femoral-popliteal bypass graft is patent however it remains patent by retrograde filling of the popliteal artery.  I was able to cross the anastomosis into the occluded peroneal artery and perform balloon angioplasty.  The patient now has single-vessel runoff via the peroneal artery.  If she continues to have issues on the left leg, consideration should be made for pedal access of the posterior tibial artery  #2  High-grade right common femoral stenosis was able to be dilated using a 5 mm balloon.  #3  The patient has a occlusion of the right superficial femoral artery with reconstitution of the above-knee popliteal artery and single-vessel runoff via the peroneal artery.  The patient continues to have difficulty of the right leg, she could be considered for angiography and attempted percutaneous revascularization.   Juleen China, M.D., [facility] [place] Office: [phone] Pager:  [phone]

## 2022-02-02 NOTE — H&P (Signed)
 [facility] [place]  Patient name: Francyne S Mclean MRN: [id number] DOB: 09/04/1950 Sex: female   REASON FOR VISIT:    Urgent add-on  [name]:   Jeannelle S Pineiro is a 71 y.o. female who has undergone the following procedures:   12/27/2017: Abdominal aortogram 11/21/2018: Abdominal aortogram 12/22/2018: Left femoral to tibioperoneal trunk bypass with saphenous vein (rest pain) 09/11/2019: Angioplasty left tibioperoneal trunk and peroneal artery, stent left external iliac artery rest pain 10/23/2019: Mechanical thrombectomy right external iliac and common femoral artery with stenting of the right common femoral, external iliac, and common iliac artery (rest pain)   She called the office last weekend complaining of severe leg pain.  She had difficulty getting here because of transportation.  Since that time, she was diagnosed with low sodium.  She also had a nervous breakdown.  Both of these situations are improved.  Her rest pain is no longer present however she does have significant claudication at less than 100 feet.  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  She takes a statin for hypercholesterolemia.  She is on Plavix.  PAST MEDICAL HISTORY:   Past Medical History:  Diagnosis Date   Anxiety    Chronic lower back pain    COPD (chronic obstructive pulmonary disease) (HCC)    Depression    GERD (gastroesophageal reflux disease)    Obstructive sleep apnea    CPAP     FAMILY HISTORY:   Family History  Problem Relation Age of Onset   Lung cancer Sister    Lung cancer Brother    Colon cancer Neg Hx     SOCIAL HISTORY:   Social History   Tobacco Use   Smoking status: Some Days    Packs/day: 0.40    Years: 40.00    Total pack years: 16.00    Types: Cigarettes   Smokeless tobacco: Never   Tobacco comments:    5 a day  Substance Use Topics   Alcohol use: No      ALLERGIES:   No Known Allergies   CURRENT MEDICATIONS:   Current Outpatient Medications  Medication Sig Dispense Refill   albuterol (PROVENTIL) (2.5 MG/3ML) 0.083% nebulizer solution Take 2.5 mg by nebulization every 6 (six) hours as needed for wheezing or shortness of breath.      albuterol (VENTOLIN HFA) 108 (90 Base) MCG/ACT inhaler Inhale 2 puffs into the lungs every 6 (six) hours as needed for wheezing or shortness of breath.      aspirin EC 81 MG tablet Take 81 mg by mouth daily.     clopidogrel (PLAVIX) 75 MG tablet Take 1 tablet (75 mg total) by mouth daily. 30 tablet 6   ibuprofen (ADVIL) 800 MG tablet Take 800 mg by mouth every 8 (eight) hours as needed (back pain.).      losartan-hydrochlorothiazide (HYZAAR) 50-12.5 MG tablet Take 1 tablet by mouth daily.     mirtazapine (REMERON) 30 MG tablet Take 1 tablet (30 mg total) by mouth at bedtime. (Patient taking differently: Take 15-30 mg by mouth at bedtime as needed (sleep).) 90 tablet 1   oxyCODONE (OXY IR/ROXICODONE) 5 MG immediate release tablet SMARTSIG:1 Tablet(s) By Mouth 6 Times Daily PRN     OXYGEN Inhale 2 L into the lungs daily as needed (low oxygen).      rosuvastatin (CRESTOR) 5 MG tablet Take 1 tablet (5 mg total) by mouth daily. 30 tablet 3   traMADol (ULTRAM)   mouth daily.       mirtazapine (REMERON) 30 MG tablet Take 1 tablet (30 mg total) by mouth at bedtime. (Patient taking differently: Take 15-30 mg by mouth at bedtime as needed (sleep).) 90 tablet 1   oxyCODONE (OXY IR/ROXICODONE) 5 MG immediate release tablet SMARTSIG:1 Tablet(s) By Mouth 6 Times Daily PRN       OXYGEN Inhale 2 L into the lungs daily as needed (low oxygen).        rosuvastatin (CRESTOR) 5 MG tablet Take 1 tablet (5 mg total) by mouth daily. 30 tablet 3   traMADol (ULTRAM) 50 MG tablet Take 50 mg by mouth 2 (two) times daily as needed for moderate pain.        TRELEGY ELLIPTA 100-62.5-25 MCG/INH AEPB Inhale 1 puff into the lungs daily.        No current facility-administered medications for this visit.      REVIEW OF SYSTEMS:    [X]  denotes positive finding, [ ]  denotes negative finding Cardiac   Comments:  Chest pain or chest pressure:      Shortness of breath upon exertion:      Short of breath when lying flat:      Irregular heart rhythm:             Vascular      Pain in calf, thigh, or hip brought on by ambulation: x    Pain  in feet at night that wakes you up from your sleep:       Blood clot in your veins:      Leg swelling:              Pulmonary      Oxygen at home:      Productive cough:       Wheezing:              Neurologic      Sudden weakness in arms or legs:       Sudden numbness in arms or legs:       Sudden onset of difficulty speaking or slurred speech:      Temporary loss of vision in one eye:       Problems with dizziness:              Gastrointestinal      Blood in stool:       Vomited blood:              Genitourinary      Burning when urinating:       Blood in urine:             Psychiatric      Major depression:              Hematologic      Bleeding problems:      Problems with blood clotting too easily:             Skin      Rashes or ulcers:             Constitutional      Fever or chills:          PHYSICAL EXAM:       Vitals:    12/28/21 0904  BP: (!) 154/82  Pulse: 65  Resp: 20  Temp: 98.4 F (36.9 C)  SpO2: 97%  Weight: 86 lb 9.6 oz (39.3 kg)  Height: 5\' 1"  (1.549 m)      GENERAL:  The patient is a well-nourished female, in no acute distress. The vital signs are documented above. CARDIAC: There is a regular rate and rhythm.  VASCULAR: Nonpalpable pedal pulses PULMONARY: Non-labored respirations ABDOMEN: Soft and non-tender with normal pitched bowel sounds.  MUSCULOSKELETAL: There are no major deformities or cyanosis. NEUROLOGIC: No focal weakness or paresthesias are detected. SKIN: There are no ulcers or rashes noted. PSYCHIATRIC: The patient has a normal affect.   STUDIES:    None   MEDICAL ISSUES:    Severe claudication: I discussed with the patient that her prior ultrasounds do show recurrent stenoses within her interventions.  Because she is having issues and had a recent deterioration, I think we need to proceed with angiography.  This would be through a right femoral approach with bilateral runoff.  I will plan on treating her iliac  stenosis and then contemplate trying to recanalize her tibioperoneal trunk which is known to be occluded below her bypass.  This will be scheduled on the first Tuesday of September.  All questions were answered.       Charlena Cross, MD, FACS [facility] [phone] Pager [phone]

## 2022-02-02 NOTE — Discharge Instructions (Signed)
Femoral Site Care This sheet gives you information about how to care for yourself after your procedure. Your health care provider may also give you more specific instructions. If you have problems or questions, contact your health care provider. What can I expect after the procedure?  After the procedure, it is common to have: Bruising that usually fades within 1-2 weeks. Tenderness at the site. Follow these instructions at home: Wound care Follow instructions from your health care provider about how to take care of your insertion site. Make sure you: Wash your hands with soap and water before you change your bandage (dressing). If soap and water are not available, use hand sanitizer. Remove your dressing as told by your health care provider. In 24 hours Do not take baths, swim, or use a hot tub until your health care provider approves. You may shower 24-48 hours after the procedure or as told by your health care provider. Gently wash the site with plain soap and water. Pat the area dry with a clean towel. Do not rub the site. This may cause bleeding. Do not apply powder or lotion to the site. Keep the site clean and dry. Check your femoral site every day for signs of infection. Check for: Redness, swelling, or pain. Fluid or blood. Warmth. Pus or a bad smell. Activity For the first 2-3 days after your procedure, or as long as directed: Avoid climbing stairs as much as possible. Do not squat. Do not lift anything that is heavier than 10 lb (4.5 kg), or the limit that you are told, until your health care provider says that it is safe. For 5 days Rest as directed. Avoid sitting for a long time without moving. Get up to take short walks every 1-2 hours. Do not drive for 24 hours if you were given a medicine to help you relax (sedative). General instructions Take over-the-counter and prescription medicines only as told by your health care provider. Keep all follow-up visits as told by  your health care provider. This is important. Contact a health care provider if you have: A fever or chills. You have redness, swelling, or pain around your insertion site. Get help right away if: The catheter insertion area swells very fast. You pass out. You suddenly start to sweat or your skin gets clammy. The catheter insertion area is bleeding, and the bleeding does not stop when you hold steady pressure on the area. The area near or just beyond the catheter insertion site becomes pale, cool, tingly, or numb. These symptoms may represent a serious problem that is an emergency. Do not wait to see if the symptoms will go away. Get medical help right away. Call your local emergency services (911 in the [place] Do not drive yourself to the hospital. Summary After the procedure, it is common to have bruising that usually fades within 1-2 weeks. Check your femoral site every day for signs of infection. Do not lift anything that is heavier than 10 lb (4.5 kg), or the limit that you are told, until your health care provider says that it is safe. This information is not intended to replace advice given to you by your health care provider. Make sure you discuss any questions you have with your health care provider. Document Revised: 05/09/2017 Document Reviewed: 05/09/2017 Elsevier Patient Education  2020 [employer] 

## 2022-02-03 ENCOUNTER — Telehealth: Payer: Self-pay | Admitting: Surgery

## 2022-02-03 ENCOUNTER — Encounter (HOSPITAL_COMMUNITY): Payer: Self-pay | Admitting: Surgery

## 2022-02-03 NOTE — Telephone Encounter (Signed)
-----   Message from Serafina Mitchell, MD sent at 02/02/2022 11:24 AM EDT ----- 02/02/2022:  Surgeon:  [name] Procedure Performed:  1.  Ultrasound-guided access, right femoral artery  2.  Abdominal aortogram  3.  Bilateral lower extremity runoff  4.  Angioplasty, left popliteal and peroneal artery  5.  Angioplasty, right common femoral artery  6.  Conscious sedation, 53 minutes   Have patient follow-up in 1 month with ABIs and bilateral lower extremity duplex

## 2022-02-08 ENCOUNTER — Telehealth: Payer: Self-pay

## 2022-02-17 DIAGNOSIS — I824Z2 Acute embolism and thrombosis of unspecified deep veins of left distal lower extremity: Secondary | ICD-10-CM | POA: Diagnosis not present

## 2022-02-17 DIAGNOSIS — Z79899 Other long term (current) drug therapy: Secondary | ICD-10-CM | POA: Diagnosis not present

## 2022-02-17 DIAGNOSIS — Z681 Body mass index (BMI) 19 or less, adult: Secondary | ICD-10-CM | POA: Diagnosis not present

## 2022-02-18 DIAGNOSIS — M7989 Other specified soft tissue disorders: Secondary | ICD-10-CM | POA: Diagnosis not present

## 2022-02-18 DIAGNOSIS — R600 Localized edema: Secondary | ICD-10-CM | POA: Diagnosis not present

## 2022-02-18 DIAGNOSIS — Z87891 Personal history of nicotine dependence: Secondary | ICD-10-CM | POA: Diagnosis not present

## 2022-02-18 DIAGNOSIS — I10 Essential (primary) hypertension: Secondary | ICD-10-CM | POA: Diagnosis not present

## 2022-02-18 DIAGNOSIS — Z7902 Long term (current) use of antithrombotics/antiplatelets: Secondary | ICD-10-CM | POA: Diagnosis not present

## 2022-02-18 DIAGNOSIS — M79605 Pain in left leg: Secondary | ICD-10-CM | POA: Diagnosis not present

## 2022-03-17 DIAGNOSIS — Z79899 Other long term (current) drug therapy: Secondary | ICD-10-CM | POA: Diagnosis not present

## 2022-03-17 DIAGNOSIS — Z681 Body mass index (BMI) 19 or less, adult: Secondary | ICD-10-CM | POA: Diagnosis not present

## 2022-03-18 ENCOUNTER — Telehealth: Payer: Self-pay

## 2022-03-18 NOTE — Telephone Encounter (Signed)
Pt called tearful with c/o pain in her R foot that has been ongoing for over a month. She did not return the office's call last month to schedule her post op. Pt has been scheduled for LE duplex with ABIs and to see MD. She is aware of these appts.

## 2022-03-19 ENCOUNTER — Ambulatory Visit (HOSPITAL_COMMUNITY)
Admission: RE | Admit: 2022-03-19 | Discharge: 2022-03-19 | Disposition: A | Discharge status: Home / Self Care | Payer: Medicare HMO | Source: Ambulatory Visit | Attending: Vascular Surgery | Admitting: Vascular Surgery

## 2022-03-19 ENCOUNTER — Other Ambulatory Visit: Payer: Self-pay | Admitting: Surgery

## 2022-03-19 ENCOUNTER — Ambulatory Visit (INDEPENDENT_AMBULATORY_CARE_PROVIDER_SITE_OTHER)
Admission: RE | Admit: 2022-03-19 | Discharge: 2022-03-19 | Disposition: A | Discharge status: Home / Self Care | Payer: Medicare HMO | Source: Ambulatory Visit | Attending: Vascular Surgery | Admitting: Vascular Surgery

## 2022-03-19 DIAGNOSIS — I739 Peripheral vascular disease, unspecified: Secondary | ICD-10-CM

## 2022-03-19 DIAGNOSIS — I70213 Atherosclerosis of native arteries of extremities with intermittent claudication, bilateral legs: Secondary | ICD-10-CM

## 2022-03-19 DIAGNOSIS — Z79899 Other long term (current) drug therapy: Secondary | ICD-10-CM | POA: Diagnosis not present

## 2022-03-22 ENCOUNTER — Ambulatory Visit (INDEPENDENT_AMBULATORY_CARE_PROVIDER_SITE_OTHER): Payer: Medicare HMO | Admitting: Surgery

## 2022-03-22 ENCOUNTER — Encounter: Payer: Self-pay | Admitting: Surgery

## 2022-03-22 ENCOUNTER — Other Ambulatory Visit: Payer: Self-pay

## 2022-03-22 VITALS — BP 133/86 | HR 103 | Temp 97.9°F | Resp 20 | Ht 62.0 in | Wt 75.9 lb

## 2022-03-22 DIAGNOSIS — I70221 Atherosclerosis of native arteries of extremities with rest pain, right leg: Secondary | ICD-10-CM

## 2022-03-22 NOTE — H&P (View-Only) (Signed)
[facility] [place]  Patient name: Lauren King MRN: [id number] DOB: Nov 04, 1950 Sex: female   REASON FOR VISIT:    Follow up  [name]:    Lauren King is a 71 y.o. female who has undergone the following procedures:   12/27/2017: Abdominal aortogram 11/21/2018: Abdominal aortogram 12/22/2018: Left femoral to tibioperoneal trunk bypass with saphenous vein (rest pain) 09/11/2019: Angioplasty left tibioperoneal trunk and peroneal artery, stent left external iliac artery rest pain 10/23/2019: Mechanical thrombectomy right external iliac and common femoral artery with stenting of the right common femoral, external iliac, and common iliac artery (rest pain)  In August, she began complaining of severe leg pain.  Due to transportation issues, she had trouble getting to the hospital, however when she did her pain was improved.  She was having claudication at 100 feet.  On 02/02/2022, she underwent angiography.  Her left femoral bypass graft was patent however it was staying open via retrograde filling of the popliteal artery.  I was able to cross the distal anastomosis into the occluded peroneal artery and perform balloon angioplasty, giving her single-vessel runoff via the peroneal artery.  She also had a high-grade right common femoral stenosis dilated with a 5 mm balloon.  The right superficial femoral artery is occluded with above-knee popliteal reconstitution.  She called the office last week complaining of continued right foot pain.  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  She takes a statin for hypercholesterolemia.  She is on Plavix.  PAST MEDICAL HISTORY:   Past Medical History:  Diagnosis Date   Anxiety    Chronic lower back pain    COPD (chronic obstructive pulmonary disease) (HCC)    Depression    GERD (gastroesophageal reflux disease)    Obstructive sleep apnea    CPAP      FAMILY HISTORY:   Family History  Problem Relation Age of Onset   Lung cancer Sister    Lung cancer Brother    Colon cancer Neg Hx     SOCIAL HISTORY:   Social History   Tobacco Use   Smoking status: Some Days    Packs/day: 0.40    Years: 40.00    Total pack years: 16.00    Types: Cigarettes   Smokeless tobacco: Never   Tobacco comments:    5 a day  Substance Use Topics   Alcohol use: No     ALLERGIES:   No Known Allergies   CURRENT MEDICATIONS:   Current Outpatient Medications  Medication Sig Dispense Refill   albuterol (VENTOLIN HFA) 108 (90 Base) MCG/ACT inhaler Inhale 1-2 puffs into the lungs every 6 (six) hours as needed for wheezing or shortness of breath.     aspirin EC 81 MG tablet Take 81 mg by mouth daily.     clopidogrel (PLAVIX) 75 MG tablet Take 1 tablet (75 mg total) by mouth daily. 30 tablet 6   hydrOXYzine (ATARAX) 25 MG tablet Take 25 mg by mouth every 8 (eight) hours as needed for anxiety.     ibuprofen (ADVIL) 800 MG tablet Take 800 mg by mouth every 8 (eight) hours as needed (back pain.).      losartan-hydrochlorothiazide (HYZAAR) 50-12.5 MG tablet Take 1 tablet by mouth daily.     OLANZapine (ZYPREXA) 2.5 MG tablet Take 1.25 mg by mouth 2 (two) times daily.     Oxycodone HCl 10 MG TABS Take 10 mg by mouth every 4 (four) hours  as needed for severe pain.     OXYGEN Inhale 2 L into the lungs daily as needed (low oxygen).      rosuvastatin (CRESTOR) 5 MG tablet Take 1 tablet (5 mg total) by mouth daily. 30 tablet 3   TRELEGY ELLIPTA 100-62.5-25 MCG/INH AEPB Inhale 1 puff into the lungs daily.     mirtazapine (REMERON) 30 MG tablet Take 1 tablet (30 mg total) by mouth at bedtime. (Patient not taking: Reported on 03/22/2022) 90 tablet 1   No current facility-administered medications for this visit.    REVIEW OF SYSTEMS:   [X]  denotes positive finding, [ ]  denotes negative finding Cardiac  Comments:  Chest pain or chest pressure:     Shortness of breath upon exertion:    Short of breath when lying flat:    Irregular heart rhythm:        Vascular    Pain in calf, thigh, or hip brought on by ambulation: x   Pain in feet at night that wakes you up from your sleep:  x   Blood clot in your veins:    Leg swelling:         Pulmonary    Oxygen at home:    Productive cough:     Wheezing:         Neurologic    Sudden weakness in arms or legs:     Sudden numbness in arms or legs:     Sudden onset of difficulty speaking or slurred speech:    Temporary loss of vision in one eye:     Problems with dizziness:         Gastrointestinal    Blood in stool:     Vomited blood:         Genitourinary    Burning when urinating:     Blood in urine:        Psychiatric    Major depression:         Hematologic    Bleeding problems:    Problems with blood clotting too easily:        Skin    Rashes or ulcers:        Constitutional    Fever or chills:      PHYSICAL EXAM:   Vitals:   03/22/22 1043  BP: 133/86  Pulse: (!) 103  Resp: 20  Temp: 97.9 F (36.6 C)  SpO2: 95%  Weight: 75 lb 14.4 oz (34.4 kg)  Height: 5\' 2"  (1.575 m)    GENERAL: The patient is a well-nourished female, in no acute distress. The vital signs are documented above. CARDIAC: There is a regular rate and rhythm.  VASCULAR: Nonpalpable pedal pulses PULMONARY: Non-labored respirations ABDOMEN: Soft and non-tender with normal pitched bowel sounds.  MUSCULOSKELETAL: There are no major deformities or cyanosis. NEUROLOGIC: Motor and sensory function are intact in both feet. SKIN: There are no ulcers or rashes noted. PSYCHIATRIC: The patient has a normal affect.  STUDIES:   I have reviewed the following: ABI/TBIToday's ABIToday's TBIPrevious ABIPrevious TBI  +-------+-----------+-----------+------------+------------+  Right 0.36       0.14       0.58        0.39          +-------+-----------+-----------+------------+------------+   Left  0.66       0.48       0.59        0.27          +-------+---  Right: Total  occlusion noted in the distal external iliac artery stent,  common femoral artery ( with colateral ) and throughout the superficial  femoral artery with collaterals visualized providing flow on distally.  MEDICAL ISSUES:   PAD: The patient has developed a right iliac occlusion since her last intervention and she is now having rest pain.  We discussed proceeding with angiography via a left femoral approach with the plans for thrombectomy and intervention.  She may need surgical intervention as well.  Because of her symptom including rest pain, this needs to be done urgently.  I am going to add her on for tomorrow.    Charlena Cross, MD, FACS [facility] [phone] Pager [phone]

## 2022-03-22 NOTE — Progress Notes (Signed)
 [facility] [place]  Patient name: Lauren King MRN: [id number] DOB: 09/16/1950 Sex: female   REASON FOR VISIT:    Follow up  [name]:    Lauren King is a 71 y.o. female who has undergone the following procedures:   12/27/2017: Abdominal aortogram 11/21/2018: Abdominal aortogram 12/22/2018: Left femoral to tibioperoneal trunk bypass with saphenous vein (rest pain) 09/11/2019: Angioplasty left tibioperoneal trunk and peroneal artery, stent left external iliac artery rest pain 10/23/2019: Mechanical thrombectomy right external iliac and common femoral artery with stenting of the right common femoral, external iliac, and common iliac artery (rest pain)  In August, she began complaining of severe leg pain.  Due to transportation issues, she had trouble getting to the hospital, however when she did her pain was improved.  She was having claudication at 100 feet.  On 02/02/2022, she underwent angiography.  Her left femoral bypass graft was patent however it was staying open via retrograde filling of the popliteal artery.  I was able to cross the distal anastomosis into the occluded peroneal artery and perform balloon angioplasty, giving her single-vessel runoff via the peroneal artery.  She also had a high-grade right common femoral stenosis dilated with a 5 mm balloon.  The right superficial femoral artery is occluded with above-knee popliteal reconstitution.  She called the office last week complaining of continued right foot pain.  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  She takes a statin for hypercholesterolemia.  She is on Plavix.  PAST MEDICAL HISTORY:   Past Medical History:  Diagnosis Date   Anxiety    Chronic lower back pain    COPD (chronic obstructive pulmonary disease) (HCC)    Depression    GERD (gastroesophageal reflux disease)    Obstructive sleep apnea    CPAP      FAMILY HISTORY:   Family History  Problem Relation Age of Onset   Lung cancer Sister    Lung cancer Brother    Colon cancer Neg Hx     SOCIAL HISTORY:   Social History   Tobacco Use   Smoking status: Some Days    Packs/day: 0.40    Years: 40.00    Total pack years: 16.00    Types: Cigarettes   Smokeless tobacco: Never   Tobacco comments:    5 a day  Substance Use Topics   Alcohol use: No     ALLERGIES:   No Known Allergies   CURRENT MEDICATIONS:   Current Outpatient Medications  Medication Sig Dispense Refill   albuterol (VENTOLIN HFA) 108 (90 Base) MCG/ACT inhaler Inhale 1-2 puffs into the lungs every 6 (six) hours as needed for wheezing or shortness of breath.     aspirin EC 81 MG tablet Take 81 mg by mouth daily.     clopidogrel (PLAVIX) 75 MG tablet Take 1 tablet (75 mg total) by mouth daily. 30 tablet 6   hydrOXYzine (ATARAX) 25 MG tablet Take 25 mg by mouth every 8 (eight) hours as needed for anxiety.     ibuprofen (ADVIL) 800 MG tablet Take 800 mg by mouth every 8 (eight) hours as needed (back pain.).      losartan-hydrochlorothiazide (HYZAAR) 50-12.5 MG tablet Take 1 tablet by mouth daily.     OLANZapine (ZYPREXA) 2.5 MG tablet Take 1.25 mg by mouth 2 (two) times daily.     Oxycodone HCl 10 MG TABS Take 10 mg by mouth every 4 (four) hours   as needed for severe pain.     OXYGEN Inhale 2 L into the lungs daily as needed (low oxygen).      rosuvastatin (CRESTOR) 5 MG tablet Take 1 tablet (5 mg total) by mouth daily. 30 tablet 3   TRELEGY ELLIPTA 100-62.5-25 MCG/INH AEPB Inhale 1 puff into the lungs daily.     mirtazapine (REMERON) 30 MG tablet Take 1 tablet (30 mg total) by mouth at bedtime. (Patient not taking: Reported on 03/22/2022) 90 tablet 1   No current facility-administered medications for this visit.    REVIEW OF SYSTEMS:   [X]  denotes positive finding, [ ]  denotes negative finding Cardiac  Comments:  Chest pain or chest pressure:     Shortness of breath upon exertion:    Short of breath when lying flat:    Irregular heart rhythm:        Vascular    Pain in calf, thigh, or hip brought on by ambulation: x   Pain in feet at night that wakes you up from your sleep:  x   Blood clot in your veins:    Leg swelling:         Pulmonary    Oxygen at home:    Productive cough:     Wheezing:         Neurologic    Sudden weakness in arms or legs:     Sudden numbness in arms or legs:     Sudden onset of difficulty speaking or slurred speech:    Temporary loss of vision in one eye:     Problems with dizziness:         Gastrointestinal    Blood in stool:     Vomited blood:         Genitourinary    Burning when urinating:     Blood in urine:        Psychiatric    Major depression:         Hematologic    Bleeding problems:    Problems with blood clotting too easily:        Skin    Rashes or ulcers:        Constitutional    Fever or chills:      PHYSICAL EXAM:   Vitals:   03/22/22 1043  BP: 133/86  Pulse: (!) 103  Resp: 20  Temp: 97.9 F (36.6 C)  SpO2: 95%  Weight: 75 lb 14.4 oz (34.4 kg)  Height: 5\' 2"  (1.575 m)    GENERAL: The patient is a well-nourished female, in no acute distress. The vital signs are documented above. CARDIAC: There is a regular rate and rhythm.  VASCULAR: Nonpalpable pedal pulses PULMONARY: Non-labored respirations ABDOMEN: Soft and non-tender with normal pitched bowel sounds.  MUSCULOSKELETAL: There are no major deformities or cyanosis. NEUROLOGIC: Motor and sensory function are intact in both feet. SKIN: There are no ulcers or rashes noted. PSYCHIATRIC: The patient has a normal affect.  STUDIES:   I have reviewed the following: ABI/TBIToday's ABIToday's TBIPrevious ABIPrevious TBI  +-------+-----------+-----------+------------+------------+  Right 0.36       0.14       0.58        0.39          +-------+-----------+-----------+------------+------------+   Left  0.66       0.48       0.59        0.27          +-------+---  Right: Total  occlusion noted in the distal external iliac artery stent,  common femoral artery ( with colateral ) and throughout the superficial  femoral artery with collaterals visualized providing flow on distally.  MEDICAL ISSUES:   PAD: The patient has developed a right iliac occlusion since her last intervention and she is now having rest pain.  We discussed proceeding with angiography via a left femoral approach with the plans for thrombectomy and intervention.  She may need surgical intervention as well.  Because of her symptom including rest pain, this needs to be done urgently.  I am going to add her on for tomorrow.    Charlena Cross, MD, FACS [facility] [phone] Pager [phone]

## 2022-03-23 ENCOUNTER — Other Ambulatory Visit: Payer: Self-pay

## 2022-03-23 ENCOUNTER — Ambulatory Visit (HOSPITAL_COMMUNITY)
Admission: RE | Disposition: A | Discharge status: Home / Self Care | Payer: Self-pay | Source: Home / Self Care | Attending: Surgery

## 2022-03-23 ENCOUNTER — Ambulatory Visit (HOSPITAL_COMMUNITY)
Admission: RE | Admit: 2022-03-23 | Discharge: 2022-03-23 | Disposition: A | Discharge status: Home / Self Care | Payer: Medicare HMO | Attending: Surgery | Admitting: Surgery

## 2022-03-23 DIAGNOSIS — E7800 Pure hypercholesterolemia, unspecified: Secondary | ICD-10-CM | POA: Diagnosis not present

## 2022-03-23 DIAGNOSIS — F17210 Nicotine dependence, cigarettes, uncomplicated: Secondary | ICD-10-CM | POA: Diagnosis not present

## 2022-03-23 DIAGNOSIS — Z79899 Other long term (current) drug therapy: Secondary | ICD-10-CM | POA: Diagnosis not present

## 2022-03-23 DIAGNOSIS — Z95828 Presence of other vascular implants and grafts: Secondary | ICD-10-CM | POA: Insufficient documentation

## 2022-03-23 DIAGNOSIS — Z7902 Long term (current) use of antithrombotics/antiplatelets: Secondary | ICD-10-CM | POA: Diagnosis not present

## 2022-03-23 DIAGNOSIS — I771 Stricture of artery: Secondary | ICD-10-CM | POA: Diagnosis not present

## 2022-03-23 DIAGNOSIS — J449 Chronic obstructive pulmonary disease, unspecified: Secondary | ICD-10-CM | POA: Insufficient documentation

## 2022-03-23 DIAGNOSIS — I70221 Atherosclerosis of native arteries of extremities with rest pain, right leg: Secondary | ICD-10-CM | POA: Insufficient documentation

## 2022-03-23 HISTORY — PX: PERIPHERAL VASCULAR THROMBECTOMY: CATH118306

## 2022-03-23 HISTORY — PX: ABDOMINAL AORTOGRAM W/LOWER EXTREMITY: CATH118223

## 2022-03-23 HISTORY — PX: PERIPHERAL VASCULAR INTERVENTION: CATH118257

## 2022-03-23 LAB — POCT I-STAT, CHEM 8
BUN: 31 mg/dL — ABNORMAL HIGH (ref 8–23)
BUN: 18 mg/dL (ref 8–23)
Calcium, Ion: 1.2 mmol/L (ref 1.15–1.40)
Calcium, Ion: 1.16 mmol/L (ref 1.15–1.40)
Chloride: 95 mmol/L — ABNORMAL LOW (ref 98–111)
Chloride: 101 mmol/L (ref 98–111)
Creatinine, Ser: 0.6 mg/dL (ref 0.44–1.00)
Creatinine, Ser: 0.4 mg/dL — ABNORMAL LOW (ref 0.44–1.00)
Glucose, Bld: 97 mg/dL (ref 70–99)
Glucose, Bld: 74 mg/dL (ref 70–99)
HCT: 40 % (ref 36.0–46.0)
HCT: 35 % — ABNORMAL LOW (ref 36.0–46.0)
Hemoglobin: 13.6 g/dL (ref 12.0–15.0)
Hemoglobin: 11.9 g/dL — ABNORMAL LOW (ref 12.0–15.0)
Potassium: 4.8 mmol/L (ref 3.5–5.1)
Potassium: 3.9 mmol/L (ref 3.5–5.1)
Sodium: 134 mmol/L — ABNORMAL LOW (ref 135–145)
Sodium: 135 mmol/L (ref 135–145)
TCO2: 29 mmol/L (ref 22–32)
TCO2: 22 mmol/L (ref 22–32)

## 2022-03-23 LAB — POCT ACTIVATED CLOTTING TIME
Activated Clotting Time: 203 seconds
Activated Clotting Time: 209 seconds
Activated Clotting Time: 227 seconds
Activated Clotting Time: 245 seconds
Activated Clotting Time: 173 seconds
Activated Clotting Time: 191 seconds

## 2022-03-23 SURGERY — ABDOMINAL AORTOGRAM W/LOWER EXTREMITY
Anesthesia: LOCAL

## 2022-03-23 MED ORDER — HEPARIN (PORCINE) IN NACL 1000-0.9 UT/500ML-% IV SOLN
INTRAVENOUS | Status: AC
  Filled 2022-03-23: qty 500

## 2022-03-23 MED ORDER — HEPARIN SODIUM (PORCINE) 1000 UNIT/ML IJ SOLN
INTRAMUSCULAR | Status: AC
  Filled 2022-03-23: qty 10

## 2022-03-23 MED ORDER — HEPARIN (PORCINE) IN NACL 1000-0.9 UT/500ML-% IV SOLN
INTRAVENOUS | Status: AC
  Filled 2022-03-23: qty 1000

## 2022-03-23 MED ORDER — FENTANYL CITRATE (PF) 100 MCG/2ML IJ SOLN
INTRAMUSCULAR | Status: DC | PRN
  Administered 2022-03-23: 25 ug via INTRAVENOUS
  Administered 2022-03-23 (×2): 50 ug via INTRAVENOUS

## 2022-03-23 MED ORDER — ASPIRIN 81 MG PO TBEC: 81.0000 mg | DELAYED_RELEASE_TABLET | Freq: Every day | ORAL | Status: DC

## 2022-03-23 MED ORDER — ACETAMINOPHEN 325 MG PO TABS
650.0000 mg | ORAL_TABLET | ORAL | Status: DC | PRN
  Administered 2022-03-23: 650 mg via ORAL
  Filled 2022-03-23: qty 2

## 2022-03-23 MED ORDER — CLOPIDOGREL BISULFATE 75 MG PO TABS
ORAL_TABLET | ORAL | Status: AC
  Filled 2022-03-23: qty 1

## 2022-03-23 MED ORDER — LIDOCAINE HCL (PF) 1 % IJ SOLN
INTRAMUSCULAR | Status: AC
  Filled 2022-03-23: qty 30

## 2022-03-23 MED ORDER — HEPARIN (PORCINE) IN NACL 1000-0.9 UT/500ML-% IV SOLN
INTRAVENOUS | Status: DC | PRN
  Administered 2022-03-23 (×4): 500 mL

## 2022-03-23 MED ORDER — FENTANYL CITRATE (PF) 100 MCG/2ML IJ SOLN
INTRAMUSCULAR | Status: AC
  Filled 2022-03-23: qty 2

## 2022-03-23 MED ORDER — MIDAZOLAM HCL 2 MG/2ML IJ SOLN
INTRAMUSCULAR | Status: DC | PRN
  Administered 2022-03-23: 2 mg via INTRAVENOUS
  Administered 2022-03-23: 1 mg via INTRAVENOUS

## 2022-03-23 MED ORDER — OXYCODONE HCL 5 MG PO TABS: 5.0000 mg | ORAL_TABLET | ORAL | Status: DC | PRN

## 2022-03-23 MED ORDER — SODIUM CHLORIDE 0.9 % IV SOLN
INTRAVENOUS | Status: DC
  Administered 2022-03-23: 500 mL via INTRAVENOUS

## 2022-03-23 MED ORDER — SODIUM CHLORIDE 0.9 % IV SOLN
INTRAVENOUS | Status: AC | PRN
  Administered 2022-03-23: 250 mL/h via INTRAVENOUS

## 2022-03-23 MED ORDER — ONDANSETRON HCL 4 MG/2ML IJ SOLN: 4.0000 mg | Freq: Four times a day (QID) | INTRAMUSCULAR | Status: DC | PRN

## 2022-03-23 MED ORDER — LIDOCAINE HCL (PF) 1 % IJ SOLN
INTRAMUSCULAR | Status: DC | PRN
  Administered 2022-03-23: 5 mL via SUBCUTANEOUS

## 2022-03-23 MED ORDER — CLOPIDOGREL BISULFATE 75 MG PO TABS: 75.0000 mg | ORAL_TABLET | Freq: Every day | ORAL | Status: DC

## 2022-03-23 MED ORDER — HEPARIN SODIUM (PORCINE) 1000 UNIT/ML IJ SOLN
INTRAMUSCULAR | Status: DC | PRN
  Administered 2022-03-23: 1500 [IU] via INTRAVENOUS
  Administered 2022-03-23: 4000 [IU] via INTRAVENOUS
  Administered 2022-03-23: 2000 [IU] via INTRAVENOUS

## 2022-03-23 MED ORDER — ASPIRIN 81 MG PO CHEW
CHEWABLE_TABLET | ORAL | Status: AC
  Filled 2022-03-23: qty 1

## 2022-03-23 MED ORDER — LABETALOL HCL 5 MG/ML IV SOLN: 10.0000 mg | INTRAVENOUS | Status: DC | PRN

## 2022-03-23 MED ORDER — MIDAZOLAM HCL 2 MG/2ML IJ SOLN
INTRAMUSCULAR | Status: AC
  Filled 2022-03-23: qty 2

## 2022-03-23 MED ORDER — IODIXANOL 320 MG/ML IV SOLN
INTRAVENOUS | Status: DC | PRN
  Administered 2022-03-23: 100 mL

## 2022-03-23 MED ORDER — MORPHINE SULFATE (PF) 2 MG/ML IV SOLN
2.0000 mg | INTRAVENOUS | Status: DC | PRN
  Administered 2022-03-23: 2 mg via INTRAVENOUS
  Filled 2022-03-23: qty 1

## 2022-03-23 MED ORDER — HEPARIN (PORCINE) IN NACL 2000-0.9 UNIT/L-% IV SOLN
INTRAVENOUS | Status: AC
  Filled 2022-03-23: qty 1000

## 2022-03-23 MED ORDER — SODIUM CHLORIDE 0.9 % WEIGHT BASED INFUSION
1.0000 mL/kg/h | INTRAVENOUS | Status: DC
  Administered 2022-03-23: 1 mL/kg/h via INTRAVENOUS

## 2022-03-23 MED ORDER — CLOPIDOGREL BISULFATE 75 MG PO TABS
ORAL_TABLET | ORAL | Status: DC | PRN
  Administered 2022-03-23: 75 mg via ORAL

## 2022-03-23 MED ORDER — ASPIRIN 81 MG PO CHEW
CHEWABLE_TABLET | ORAL | Status: DC | PRN
  Administered 2022-03-23: 81 mg via ORAL

## 2022-03-23 MED ORDER — HYDRALAZINE HCL 20 MG/ML IJ SOLN: 5.0000 mg | INTRAMUSCULAR | Status: DC | PRN

## 2022-03-23 MED ORDER — OXYCODONE HCL 5 MG PO TABS
10.0000 mg | ORAL_TABLET | Freq: Once | ORAL | Status: AC
  Administered 2022-03-23: 10 mg via ORAL
  Filled 2022-03-23: qty 2

## 2022-03-23 SURGICAL SUPPLY — 28 items
BAG SNAP BAND KOVER 36X36 (MISCELLANEOUS) IMPLANT
BALLN MUSTANG 7.0X40 135 (BALLOONS) ×2
BALLOON MUSTANG 7.0X40 135 (BALLOONS) IMPLANT
CATH ANGIO 5F BER2 65CM (CATHETERS) IMPLANT
CATH JETI 6FR 120 (CATHETERS) IMPLANT
CATH MUSTANG 4X200X135 (BALLOONS) IMPLANT
CATH OMNI FLUSH 5F 65CM (CATHETERS) IMPLANT
CATH QUICKCROSS SUPP .035X90CM (MICROCATHETER) IMPLANT
CATH SYNTRAX .018X90 (CATHETERS) IMPLANT
GLIDEWIRE ADV .035X260CM (WIRE) IMPLANT
GUIDEWIRE ANGLED .035X150CM (WIRE) IMPLANT
KIT ACCESSORY JETI (KITS) IMPLANT
KIT ENCORE 26 ADVANTAGE (KITS) IMPLANT
KIT MICROPUNCTURE NIT STIFF (SHEATH) IMPLANT
KIT PV (KITS) ×2 IMPLANT
SHEATH PINNACLE 5F 10CM (SHEATH) IMPLANT
SHEATH PINNACLE ST 6F 45CM (SHEATH) IMPLANT
STENT ELUVIA 6X100X130 (Permanent Stent) IMPLANT
STENT ELUVIA 6X120X130 (Permanent Stent) IMPLANT
STENT ELUVIA 6X150X130 (Permanent Stent) IMPLANT
STENT ELUVIA 7X60X130 (Permanent Stent) IMPLANT
STENT INNOVA 8X60X130 (Permanent Stent) IMPLANT
SYR MEDRAD MARK V 150ML (SYRINGE) IMPLANT
TRANSDUCER W/STOPCOCK (MISCELLANEOUS) ×2 IMPLANT
TRAY PV CATH (CUSTOM PROCEDURE TRAY) ×2 IMPLANT
WIRE BENTSON .035X145CM (WIRE) IMPLANT
WIRE G V18X300CM (WIRE) IMPLANT
WIRE HI TORQ VERSACORE 300 (WIRE) IMPLANT

## 2022-03-23 NOTE — Op Note (Signed)
Patient name: Lauren King MRN: [id number] DOB: 13-Nov-1950 Sex: female  03/23/2022 Pre-operative Diagnosis: Right leg rest pain Post-operative diagnosis:  Same Surgeon:  [name] Procedure Performed:  1.  Ultrasound-guided access, left femoral artery  2.  Abdominal aortogram  3.  Bilateral lower extremity runoff  4.  Mechanical thrombectomy of the right external iliac, common femoral, and superficial femoral artery using the Jeti 28F device  5.  Stent, right superficial femoral artery  6.  Stent, right external iliac artery  7.  Balloon angioplasty right common femoral artery  8.  Stent, left external iliac artery  9.  Stent, left common iliac artery  10.  Conscious sedation, 138 minutes   Indications: The patient presented to the office yesterday with rest pain in her right leg.  She had previously undergone intervention for bypass graft stenosis on the left.  She was found to have a right common femoral artery subtotal occlusion which was treated with balloon angioplasty.  She began having symptoms 1 month ago.  Ultrasound yesterday revealed common femoral occlusion.  She is here today for further evaluation and possible intervention.  Procedure:  The patient was identified in the holding area and taken to room 8.  The patient was then placed supine on the table and prepped and draped in the usual sterile fashion.  A time out was called.  Conscious sedation was administered with the use of IV fentanyl and Versed under continuous physician and nurse monitoring.  Heart rate, blood pressure, and oxygen saturation were continuously monitored.  Total sedation time was 138 minutes.  Ultrasound was used to evaluate the left common femoral artery.  It was patent .  A digital ultrasound image was acquired.  A micropuncture needle was used to access the left common femoral artery under ultrasound guidance.  An 018 wire was advanced without resistance and a micropuncture sheath was placed.   The 018 wire was removed and a benson wire was placed.  The micropuncture sheath was exchanged for a 5 french sheath.  An omniflush catheter was advanced over the wire to the level of L-1.  An abdominal angiogram was obtained.  Next, using the omniflush catheter and a benson wire, the aortic bifurcation was crossed and the catheter was placed into theright external iliac artery and right runoff was obtained.  left runoff was performed via retrograde sheath injections.  Findings:   Aortogram: No significant renal artery stenosis was visualized.  The infrarenal abdominal aorta is calcified but patent throughout its course.  The left common external iliac artery patent however there is significant luminal irregularity at the proximal edge of the left external iliac stent up into the common iliac artery.  The right common iliac artery is widely patent.  The distal right external iliac artery including the stent are occluded  Right Lower Extremity: There is reconstitution of the right profundofemoral artery.  The superficial femoral artery is occluded.  There is reconstitution of the above-knee popliteal artery with single-vessel runoff via the peroneal artery  Left Lower Extremity: The common femoral, profundofemoral and bypass graft were visualized and found to be patent however there is sluggish flow due to the sheath   Intervention: After the above images were acquired the decision was made to proceed with intervention.  A 6 French 45 cm sheath was advanced into the midportion of the right external iliac artery.  The patient was then fully heparinized.  Heparin levels monitored throughout the case and redosed appropriately.  I  then used a V-18 wire and a 018 quick cross catheter and was able to get wire access into the superficial femoral artery.  This was relatively easy to do.  Therefore I elected to use the JETI without going over a wire.  Mechanical thrombectomy was performed of the right external iliac,  common femoral and proximal superficial femoral artery.  Multiple passes were made.  Follow-up imaging shows that we did create a flow channel and reestablish patency into the common femoral artery.  Outflow was through the profunda.  I then used a 035 quick cross and a Glidewire advantage and was able to cross the total occlusion of the superficial femoral artery and get reentry into the above-knee popliteal artery which was confirmed with a contrast injection.  I initially performed balloon angioplasty of the entire occluded segment, on follow-up studies, there was a dissection at the proximal and distal ends of the occlusion which I felt needed to be treated.  Therefore overlapping 6 mm Elluvia stents were deployed and postdilated with a 4 mm balloon.  I then repeated balloon angioplasty of the common femoral artery using a 7 mm balloon.  This was also done into the external iliac artery.  There was residual stenosis within the external iliac artery after balloon angioplasty and so I treated this with a 7 x 60 Elluvia stent which was postdilated with a 7 mm balloon.  Completion imaging showed widely patent external iliac artery as well as the common femoral and superficial femoral artery with single-vessel runoff via the peroneal artery.  There was a flow-limiting dissection in the common femoral artery which I did not elect to treat.  Next, I withdrew the sheath in the left external iliac artery and performed stenting of the left common and proximal external iliac artery using an 8 x 60 INNOVA stent which was postdilated with a 7 mm balloon.  There was no residual stenosis on completion.  At this point the decision made to terminate the procedure.  Catheters and wires were removed.  The patient taken holding it for sheath pull once her coagulation profile corrects  Impression:  #1  Occluded right distal external, common femoral, and superficial femoral artery.  Mechanical thrombectomy was used to establish a  flow channel through the external iliac and common femoral artery.  Recanalization of an occluded SFA was then performed.  The SFA was stented with overlapping 6 mm Elluvia stents that were postdilated with a 4 mm balloon.  The common femoral was treated with a 7 mm balloon angioplasty.  There was residual stenosis within the pre-existing external iliac stent which was restented using a 7 x 60 Elluvia  #2  Luminal irregularity which made wire crossing very challenging at the proximal edge of the left external iliac stent.  A 8 x 60 INNOVA was deployed in the common iliac and left external iliac artery to correct this issue.   Juleen China, M.D., [facility] [place] Office: [phone] Pager:  [phone]

## 2022-03-23 NOTE — Interval H&P Note (Signed)
History and Physical Interval Note:  03/23/2022 9:42 AM  Lauren King  has presented today for surgery, with the diagnosis of rest pain.  The various methods of treatment have been discussed with the patient and family. After consideration of risks, benefits and other options for treatment, the patient has consented to  Procedure(s): ABDOMINAL AORTOGRAM W/LOWER EXTREMITY (N/A) as a surgical intervention.  The patient's history has been reviewed, patient examined, no change in status, stable for surgery.  I have reviewed the patient's chart and labs.  Questions were answered to the patient's satisfaction.     [name]

## 2022-03-23 NOTE — Progress Notes (Signed)
Site area- left  Site Prior to Removal- 0   Pressure Applied For-  25 MInutes   Bedrest Beginning at - 1655   Manual- Yes   Patient Status During Pull- Stable    Post Pull Groin Site- 0   Post Pull Instructions Given- Yes   Post Pull Pulses Present- Yes    Dressing Applied- Tegaderm and Gauze Dressing    Comments:  6 Fr long sheath removed from the left femoral artery. Patient had a small quarter size knot in the left groin prior to procedure today according to staff in the room. Pressure held proximal to the skin puncture site while ensuring a pulse just distal to the puncture site was still present. Initially slightly difficult to obtain hemostasis due to anatomy. Pressure held for 25 minutes without complication. No bruising, hematoma, or bleeding present following 25 minutes of pressure. PT pulse easily found with doppler post sheath pull. Post sheath pull instructions given.

## 2022-03-23 NOTE — Discharge Instructions (Signed)
Femoral Site Care This sheet gives you information about how to care for yourself after your procedure. Your health care provider may also give you more specific instructions. If you have problems or questions, contact your health care provider. What can I expect after the procedure?  After the procedure, it is common to have: Bruising that usually fades within 1-2 weeks. Tenderness at the site. Follow these instructions at home: Wound care Follow instructions from your health care provider about how to take care of your insertion site. Make sure you: Wash your hands with soap and water before you change your bandage (dressing). If soap and water are not available, use hand sanitizer. Remove your dressing as told by your health care provider. In 24 hours Do not take baths, swim, or use a hot tub until your health care provider approves. You may shower 24-48 hours after the procedure or as told by your health care provider. Gently wash the site with plain soap and water. Pat the area dry with a clean towel. Do not rub the site. This may cause bleeding. Do not apply powder or lotion to the site. Keep the site clean and dry. Check your femoral site every day for signs of infection. Check for: Redness, swelling, or pain. Fluid or blood. Warmth. Pus or a bad smell. Activity For the first 2-3 days after your procedure, or as long as directed: Avoid climbing stairs as much as possible. Do not squat. Do not lift anything that is heavier than 10 lb (4.5 kg), or the limit that you are told, until your health care provider says that it is safe. For 5 days Rest as directed. Avoid sitting for a long time without moving. Get up to take short walks every 1-2 hours. Do not drive for 24 hours if you were given a medicine to help you relax (sedative). General instructions Take over-the-counter and prescription medicines only as told by your health care provider. Keep all follow-up visits as told by  your health care provider. This is important. Contact a health care provider if you have: A fever or chills. You have redness, swelling, or pain around your insertion site. Get help right away if: The catheter insertion area swells very fast. You pass out. You suddenly start to sweat or your skin gets clammy. The catheter insertion area is bleeding, and the bleeding does not stop when you hold steady pressure on the area. The area near or just beyond the catheter insertion site becomes pale, cool, tingly, or numb. These symptoms may represent a serious problem that is an emergency. Do not wait to see if the symptoms will go away. Get medical help right away. Call your local emergency services (911 in the [place] Do not drive yourself to the hospital. Summary After the procedure, it is common to have bruising that usually fades within 1-2 weeks. Check your femoral site every day for signs of infection. Do not lift anything that is heavier than 10 lb (4.5 kg), or the limit that you are told, until your health care provider says that it is safe. This information is not intended to replace advice given to you by your health care provider. Make sure you discuss any questions you have with your health care provider. Document Revised: 05/09/2017 Document Reviewed: 05/09/2017 Elsevier Patient Education  2020 [employer] 

## 2022-03-24 ENCOUNTER — Encounter (HOSPITAL_COMMUNITY): Payer: Self-pay | Admitting: Surgery

## 2022-03-24 MED FILL — Lidocaine HCl Local Preservative Free (PF) Inj 1%: INTRAMUSCULAR | Qty: 30 | Status: AC

## 2022-03-26 ENCOUNTER — Telehealth: Payer: Self-pay | Admitting: Surgery

## 2022-03-26 NOTE — Telephone Encounter (Signed)
-----   Message from Nada Libman, MD sent at 03/23/2022  2:27 PM EST ----- 03/23/2022:   Surgeon:  [name] Procedure Performed:  1.  Ultrasound-guided access, left femoral artery  2.  Abdominal aortogram  3.  Bilateral lower extremity runoff  4.  Mechanical thrombectomy of the right external iliac, common femoral, and superficial femoral artery using the Jeti 62F device  5.  Stent, right superficial femoral artery  6.  Stent, right external iliac artery  7.  Balloon angioplasty right common femoral artery  8.  Stent, left external iliac artery  9.  Stent, left common iliac artery  10.  Conscious sedation, 138 minutes   Schedule patient for follow-up and 1 month with ABIs and bilateral lower extremity arterial duplex

## 2022-03-29 ENCOUNTER — Telehealth: Payer: Self-pay

## 2022-03-29 DIAGNOSIS — I70213 Atherosclerosis of native arteries of extremities with intermittent claudication, bilateral legs: Secondary | ICD-10-CM

## 2022-03-29 DIAGNOSIS — I739 Peripheral vascular disease, unspecified: Secondary | ICD-10-CM

## 2022-03-29 DIAGNOSIS — I70223 Atherosclerosis of native arteries of extremities with rest pain, bilateral legs: Secondary | ICD-10-CM

## 2022-03-29 NOTE — Telephone Encounter (Signed)
Pt called stating that she didn't think the stents held because the pain was back again.  Reviewed pt's chart, returned call for clarification, two identifiers used. Pt stated that the pain was the exact same as before the surgery. Asked if she felt it was surgical and she stated it had not changed and there was no improvement at all. No other symptoms. Pt's appts moved earlier. Confirmed understanding.

## 2022-03-29 NOTE — Telephone Encounter (Signed)
Pt called.  Returned call, no answer, unable to leave msg.

## 2022-04-05 ENCOUNTER — Ambulatory Visit (HOSPITAL_COMMUNITY)
Admission: RE | Admit: 2022-04-05 | Discharge: 2022-04-05 | Disposition: A | Discharge status: Home / Self Care | Payer: Medicare HMO | Source: Ambulatory Visit | Attending: Surgery | Admitting: Surgery

## 2022-04-05 ENCOUNTER — Ambulatory Visit (INDEPENDENT_AMBULATORY_CARE_PROVIDER_SITE_OTHER): Payer: Medicare HMO | Admitting: Surgery

## 2022-04-05 ENCOUNTER — Ambulatory Visit (INDEPENDENT_AMBULATORY_CARE_PROVIDER_SITE_OTHER)
Admission: RE | Admit: 2022-04-05 | Discharge: 2022-04-05 | Disposition: A | Discharge status: Home / Self Care | Payer: Medicare HMO | Source: Ambulatory Visit | Attending: Surgery | Admitting: Surgery

## 2022-04-05 ENCOUNTER — Encounter: Payer: Self-pay | Admitting: Surgery

## 2022-04-05 VITALS — BP 166/77 | HR 78 | Temp 98.0°F | Resp 20 | Ht 62.0 in | Wt 77.0 lb

## 2022-04-05 DIAGNOSIS — I70213 Atherosclerosis of native arteries of extremities with intermittent claudication, bilateral legs: Secondary | ICD-10-CM

## 2022-04-05 DIAGNOSIS — I739 Peripheral vascular disease, unspecified: Secondary | ICD-10-CM

## 2022-04-05 DIAGNOSIS — I70223 Atherosclerosis of native arteries of extremities with rest pain, bilateral legs: Secondary | ICD-10-CM | POA: Insufficient documentation

## 2022-04-05 MED ORDER — GABAPENTIN 300 MG PO CAPS: 300.0000 mg | ORAL_CAPSULE | Freq: Every day | ORAL | 3 refills | Status: DC

## 2022-04-05 NOTE — Progress Notes (Signed)
[facility] [place]  Patient name: Lauren King MRN: [id number] DOB: November 29, 1950 Sex: female   REASON FOR VISIT:    Follow up  [name]:     Lauren King is a 71 y.o. female who has undergone the following procedures:   12/27/2017: Abdominal aortogram 11/21/2018: Abdominal aortogram 12/22/2018: Left femoral to tibioperoneal trunk bypass with saphenous vein (rest pain) 09/11/2019: Angioplasty left tibioperoneal trunk and peroneal artery, stent left external iliac artery rest pain 10/23/2019: Mechanical thrombectomy right external iliac and common femoral artery with stenting of the right common femoral, external iliac, and common iliac artery (rest pain) 01/13/2022: Angioplasty, left popliteal and peroneal artery, angioplasty, right common femoral artery (claudication) 03/23/2022: Mechanical thrombectomy of the right external iliac, common femoral, and superficial femoral artery, stent right superficial femoral, right external iliac, stent, left external iliac, stent left common iliac (rest pain)  In August, she began complaining of severe leg pain.  Due to transportation issues, she had trouble getting to the hospital, however when she did her pain was improved.  She was having claudication at 100 feet.  On 02/02/2022, she underwent angiography.  Her left femoral bypass graft was patent however it was staying open via retrograde filling of the popliteal artery.  I was able to cross the distal anastomosis into the occluded peroneal artery and perform balloon angioplasty, giving her single-vessel runoff via the peroneal artery.  She also had a high-grade right common femoral stenosis dilated with a 5 mm balloon.  The right superficial femoral artery is occluded with above-knee popliteal reconstitution.  She was having persistent right pain and so she underwent angiography on 03/23/2022.  I performed mechanical thrombectomy  to reestablish flow through the external iliac and common femoral artery with a jet device.  She then went on to have stenting of bilateral common and external iliac arteries as well as recanalization of an occluded right superficial femoral artery with subsequent stenting.  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  She takes a statin for hypercholesterolemia.  She is on Plavix.    PAST MEDICAL HISTORY:   Past Medical History:  Diagnosis Date   Anxiety    Chronic lower back pain    COPD (chronic obstructive pulmonary disease) (HCC)    Depression    GERD (gastroesophageal reflux disease)    Obstructive sleep apnea    CPAP     FAMILY HISTORY:   Family History  Problem Relation Age of Onset   Lung cancer Sister    Lung cancer Brother    Colon cancer Neg Hx     SOCIAL HISTORY:   Social History   Tobacco Use   Smoking status: Former    Packs/day: 0.40    Years: 40.00    Total pack years: 16.00    Types: Cigarettes   Smokeless tobacco: Never   Tobacco comments:    5 a day  Substance Use Topics   Alcohol use: No     ALLERGIES:   No Known Allergies   CURRENT MEDICATIONS:   Current Outpatient Medications  Medication Sig Dispense Refill   albuterol (VENTOLIN HFA) 108 (90 Base) MCG/ACT inhaler Inhale 1-2 puffs into the lungs every 6 (six) hours as needed for wheezing or shortness of breath.     aspirin EC 81 MG tablet Take 81 mg by  mouth daily.     clopidogrel (PLAVIX) 75 MG tablet Take 1 tablet (75 mg total) by mouth daily. 30 tablet 6   hydrOXYzine (ATARAX) 25 MG tablet Take 25 mg by mouth every 8 (eight) hours as needed for anxiety.     ibuprofen (ADVIL) 800 MG tablet Take 800 mg by mouth every 8 (eight) hours as needed (back pain.).      losartan-hydrochlorothiazide (HYZAAR) 50-12.5 MG tablet Take 1 tablet by mouth daily.     mirtazapine  (REMERON) 30 MG tablet Take 1 tablet (30 mg total) by mouth at bedtime. 90 tablet 1   OLANZapine (ZYPREXA) 2.5 MG tablet Take 1.25 mg by mouth 2 (two) times daily.     Oxycodone HCl 10 MG TABS Take 10 mg by mouth every 4 (four) hours as needed for severe pain.     OXYGEN Inhale 2 L into the lungs daily as needed (low oxygen).      rosuvastatin (CRESTOR) 5 MG tablet Take 1 tablet (5 mg total) by mouth daily. 30 tablet 3   TRELEGY ELLIPTA 100-62.5-25 MCG/INH AEPB Inhale 1 puff into the lungs daily.     No current facility-administered medications for this visit.    REVIEW OF SYSTEMS:   [X]  denotes positive finding, [ ]  denotes negative finding Cardiac  Comments:  Chest pain or chest pressure:    Shortness of breath upon exertion:    Short of breath when lying flat:    Irregular heart rhythm:        Vascular    Pain in calf, thigh, or hip brought on by ambulation: x   Pain in feet at night that wakes you up from your sleep:  x   Blood clot in your veins:    Leg swelling:         Pulmonary    Oxygen at home:    Productive cough:     Wheezing:         Neurologic    Sudden weakness in arms or legs:     Sudden numbness in arms or legs:     Sudden onset of difficulty speaking or slurred speech:    Temporary loss of vision in one eye:     Problems with dizziness:         Gastrointestinal    Blood in stool:     Vomited blood:         Genitourinary    Burning when urinating:     Blood in urine:        Psychiatric    Major depression:         Hematologic    Bleeding problems:    Problems with blood clotting too easily:        Skin    Rashes or ulcers:        Constitutional    Fever or chills:      PHYSICAL EXAM:   Vitals:   04/05/22 1353  BP: (!) 166/77  Pulse: 78  Resp: 20  Temp: 98 F (36.7 C)  SpO2: 94%  Weight: 77 lb (34.9 kg)  Height: 5\' 2"  (1.575 m)    GENERAL: The patient is a well-nourished female, in no acute distress. The vital signs are  documented above. CARDIAC: There is a regular rate and rhythm.  VASCULAR: Both feet are warm with good color.  There is a palpable dorsalis pedis pulse on the right PULMONARY: Non-labored respirations MUSCULOSKELETAL: There are no major deformities or cyanosis. NEUROLOGIC:  No focal weakness or paresthesias are detected. SKIN: There are no ulcers or rashes noted. PSYCHIATRIC: The patient has a normal affect.  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  She is having persistent right foot pain.  Ultrasound today shows diminished blood flow however it was a technically difficult study.  Her foot is very warm.  She has a palpable dorsalis pedis pulse.  I wonder if she is just having neuropathic pain from reperfusion.  I am going to give her a trial of gabapentin.  I will bring her back in 3 months to repeat her studies.  She may require another arteriogram to confirm patency without stenosis of her recent intervention.  She has also recently undergone recanalization of an occluded popliteal artery distal to her left leg bypass.  This will also be evaluated in 3 months.    Charlena Cross, MD, FACS [facility] [phone] Pager [phone]

## 2022-04-07 ENCOUNTER — Other Ambulatory Visit: Payer: Self-pay

## 2022-04-07 DIAGNOSIS — I70213 Atherosclerosis of native arteries of extremities with intermittent claudication, bilateral legs: Secondary | ICD-10-CM

## 2022-04-07 DIAGNOSIS — I739 Peripheral vascular disease, unspecified: Secondary | ICD-10-CM

## 2022-04-07 DIAGNOSIS — I70223 Atherosclerosis of native arteries of extremities with rest pain, bilateral legs: Secondary | ICD-10-CM

## 2022-04-14 DIAGNOSIS — Z681 Body mass index (BMI) 19 or less, adult: Secondary | ICD-10-CM | POA: Diagnosis not present

## 2022-04-14 DIAGNOSIS — Z79899 Other long term (current) drug therapy: Secondary | ICD-10-CM | POA: Diagnosis not present

## 2022-04-26 ENCOUNTER — Encounter (HOSPITAL_COMMUNITY): Payer: Medicare HMO

## 2022-05-14 DIAGNOSIS — Z681 Body mass index (BMI) 19 or less, adult: Secondary | ICD-10-CM | POA: Diagnosis not present

## 2022-05-14 DIAGNOSIS — Z79899 Other long term (current) drug therapy: Secondary | ICD-10-CM | POA: Diagnosis not present

## 2022-05-14 NOTE — Telephone Encounter (Signed)
App scheduled.

## 2022-06-11 DIAGNOSIS — Z681 Body mass index (BMI) 19 or less, adult: Secondary | ICD-10-CM | POA: Diagnosis not present

## 2022-06-11 DIAGNOSIS — Z79899 Other long term (current) drug therapy: Secondary | ICD-10-CM | POA: Diagnosis not present

## 2022-07-05 ENCOUNTER — Encounter (HOSPITAL_COMMUNITY): Payer: Medicare HMO

## 2022-07-05 ENCOUNTER — Ambulatory Visit: Payer: Medicare HMO | Admitting: Surgery

## 2022-07-09 DIAGNOSIS — Z79899 Other long term (current) drug therapy: Secondary | ICD-10-CM | POA: Diagnosis not present

## 2022-07-09 DIAGNOSIS — Z681 Body mass index (BMI) 19 or less, adult: Secondary | ICD-10-CM | POA: Diagnosis not present

## 2022-07-19 ENCOUNTER — Ambulatory Visit (INDEPENDENT_AMBULATORY_CARE_PROVIDER_SITE_OTHER)
Admission: RE | Admit: 2022-07-19 | Discharge: 2022-07-19 | Disposition: A | Discharge status: Home / Self Care | Payer: Medicare HMO | Source: Ambulatory Visit | Attending: Surgery | Admitting: Surgery

## 2022-07-19 ENCOUNTER — Encounter: Payer: Self-pay | Admitting: Surgery

## 2022-07-19 ENCOUNTER — Ambulatory Visit (HOSPITAL_COMMUNITY)
Admission: RE | Admit: 2022-07-19 | Discharge: 2022-07-19 | Disposition: A | Discharge status: Home / Self Care | Payer: Medicare HMO | Source: Ambulatory Visit | Attending: Surgery | Admitting: Surgery

## 2022-07-19 ENCOUNTER — Ambulatory Visit (INDEPENDENT_AMBULATORY_CARE_PROVIDER_SITE_OTHER): Payer: Medicare HMO | Admitting: Surgery

## 2022-07-19 VITALS — BP 123/77 | HR 68 | Temp 98.1°F | Resp 20 | Ht 62.0 in | Wt 79.0 lb

## 2022-07-19 DIAGNOSIS — I739 Peripheral vascular disease, unspecified: Secondary | ICD-10-CM

## 2022-07-19 DIAGNOSIS — I70223 Atherosclerosis of native arteries of extremities with rest pain, bilateral legs: Secondary | ICD-10-CM

## 2022-07-19 DIAGNOSIS — I70213 Atherosclerosis of native arteries of extremities with intermittent claudication, bilateral legs: Secondary | ICD-10-CM

## 2022-07-19 LAB — VAS US ABI WITH/WO TBI
Left ABI: 0.56
Right ABI: 0.47

## 2022-07-19 MED ORDER — PREGABALIN 25 MG PO CAPS: 25.0000 mg | ORAL_CAPSULE | Freq: Two times a day (BID) | ORAL | 3 refills | Status: DC

## 2022-07-19 NOTE — Progress Notes (Signed)
[facility] [place]  Patient name: Lauren King MRN: [id number] DOB: 1950/12/18 Sex: female   REASON FOR VISIT:    Follow up  [place] ILLNESS:    Lauren King is a 71 y.o. female who has undergone the following procedures:   12/27/2017: Abdominal aortogram 11/21/2018: Abdominal aortogram 12/22/2018: Left femoral to tibioperoneal trunk bypass with saphenous vein (rest pain) 09/11/2019: Angioplasty left tibioperoneal trunk and peroneal artery, stent left external iliac artery rest pain 10/23/2019: Mechanical thrombectomy right external iliac and common femoral artery with stenting of the right common femoral, external iliac, and common iliac artery (rest pain) 02/02/2022: Angioplasty, left popliteal and peroneal artery, angioplasty, right common femoral artery (claudication) 03/23/2022: Mechanical thrombectomy of the right external iliac, common femoral, and superficial femoral artery, stent right superficial femoral, right external iliac, stent, left external iliac, stent left common iliac (rest pain)  In August 2023, she began complaining of severe left leg pain.  Due to transportation issues, she had trouble getting to the hospital, however when she did her pain was improved.  She was only having claudication at 100 feet.  On 02/02/2022, she underwent angiography.  Her left femoral bypass graft was patent however it was staying open via retrograde filling of the popliteal artery.  I was able to cross the distal anastomosis into the occluded peroneal artery and perform balloon angioplasty, giving her single-vessel runoff via the peroneal artery.  She also had a high-grade right common femoral stenosis that was dilated with a 5 mm balloon.  The right superficial femoral artery was occluded with above-knee popliteal reconstitution.  She began having persistent right leg pain and so she underwent angiography on 03/23/2022.  She  was found to have occluded her right external and common femoral arteries.  I performed mechanical thrombectomy to reestablish flow through the external iliac and common femoral artery with a Jeti device.  She then went on to have stenting of bilateral common and external iliac arteries as well as recanalization of an occluded right superficial femoral artery with subsequent stenting.    Her biggest problem today is burning under her right toe.  Her walking is limited by fatigue.  She does not walk very far before taking a break.  She does not have any open wounds  She does suffer from COPD secondary to longstanding tobacco abuse.  She takes a statin for hypercholesterolemia.  She is on Plavix.    PAST MEDICAL HISTORY:   Past Medical History:  Diagnosis Date   Anxiety    Chronic lower back pain    COPD (chronic obstructive pulmonary disease) (HCC)    Depression    GERD (gastroesophageal reflux disease)    Obstructive sleep apnea    CPAP     FAMILY HISTORY:   Family History  Problem Relation Age of Onset   Lung cancer Sister    Lung cancer Brother    Colon cancer Neg Hx     SOCIAL HISTORY:   Social History   Tobacco Use   Smoking status: Former    Packs/day: 0.40    Years: 40.00    Total pack years: 16.00    Types: Cigarettes   Smokeless tobacco: Never   Tobacco comments:    5 a day  Substance Use Topics   Alcohol use: No     ALLERGIES:   No Known Allergies   CURRENT MEDICATIONS:   Current Outpatient Medications  Medication Sig Dispense Refill   albuterol (VENTOLIN  HFA) 108 (90 Base) MCG/ACT inhaler Inhale 1-2 puffs into the lungs every 6 (six) hours as needed for wheezing or shortness of breath.     aspirin EC 81 MG tablet Take 81 mg by mouth daily.     clopidogrel (PLAVIX) 75 MG tablet Take 1 tablet (75 mg total) by mouth daily. 30 tablet 6   gabapentin (NEURONTIN) 300 MG capsule Take 1 capsule (300 mg total) by mouth daily. 60 capsule 3   hydrOXYzine  (ATARAX) 25 MG tablet Take 25 mg by mouth every 8 (eight) hours as needed for anxiety.     ibuprofen (ADVIL) 800 MG tablet Take 800 mg by mouth every 8 (eight) hours as needed (back pain.).      losartan-hydrochlorothiazide (HYZAAR) 50-12.5 MG tablet Take 1 tablet by mouth daily.     mirtazapine (REMERON) 30 MG tablet Take 1 tablet (30 mg total) by mouth at bedtime. 90 tablet 1   OLANZapine (ZYPREXA) 2.5 MG tablet Take 1.25 mg by mouth 2 (two) times daily.     Oxycodone HCl 10 MG TABS Take 10 mg by mouth every 4 (four) hours as needed for severe pain.     OXYGEN Inhale 2 L into the lungs daily as needed (low oxygen).      rosuvastatin (CRESTOR) 5 MG tablet Take 1 tablet (5 mg total) by mouth daily. 30 tablet 3   TRELEGY ELLIPTA 100-62.5-25 MCG/INH AEPB Inhale 1 puff into the lungs daily.     No current facility-administered medications for this visit.    REVIEW OF SYSTEMS:   '[X]'$  denotes positive finding, '[ ]'$  denotes negative finding Cardiac  Comments:  Chest pain or chest pressure:    Shortness of breath upon exertion:    Short of breath when lying flat:    Irregular heart rhythm:        Vascular    Pain in calf, thigh, or hip brought on by ambulation: x   Pain in feet at night that wakes you up from your sleep:  x   Blood clot in your veins:    Leg swelling:         Pulmonary    Oxygen at home:    Productive cough:     Wheezing:         Neurologic    Sudden weakness in arms or legs:     Sudden numbness in arms or legs:     Sudden onset of difficulty speaking or slurred speech:    Temporary loss of vision in one eye:     Problems with dizziness:         Gastrointestinal    Blood in stool:     Vomited blood:         Genitourinary    Burning when urinating:     Blood in urine:        Psychiatric    Major depression:         Hematologic    Bleeding problems:    Problems with blood clotting too easily:        Skin    Rashes or ulcers:        Constitutional     Fever or chills:      PHYSICAL EXAM:   There were no vitals filed for this visit.  GENERAL: The patient is a well-nourished female, in no acute distress. The vital signs are documented above. CARDIAC: There is a regular rate and rhythm.  VASCULAR: Nonpalpable pedal pulses PULMONARY:  Non-labored respirations MUSCULOSKELETAL: There are no major deformities or cyanosis. NEUROLOGIC: No focal weakness or paresthesias are detected. SKIN: There are no ulcers or rashes noted. PSYCHIATRIC: The patient has a normal affect.  STUDIES:   I have reviewed the following: +-------+-----------+-----------+------------+------------+  ABI/TBIToday's ABIToday's TBIPrevious ABIPrevious TBI  +-------+-----------+-----------+------------+------------+  Right 0.47       0          1.02        0.59          +-------+-----------+-----------+------------+------------+  Left  0.56       0          0.51        0.21          +-------+-----------+-----------+------------+------------+   Aorta: Stenosis: +------------------+-------------+--------------+  Location          Stenosis     Stent           +------------------+-------------+--------------+  Prox Aorta        <50% stenosis                +------------------+-------------+--------------+  Mid Aorta         <50% stenosis                +------------------+-------------+--------------+  Distal Aorta      <50% stenosis                +------------------+-------------+--------------+  Right Common Iliac             1-49% stenosis  +------------------+-------------+--------------+  Left Common Iliac              1-49% stenosis  +------------------+-------------+--------------+    SMA PSV > 300cm/s suggestive of >70% stenosis.  Lower extremity: Right: 50-74% stenosis noted in the deep femoral artery. Total occlusion  noted in the posterior tibial artery. Right SFA stent occluded with  reconstituted flow  distally.   Left: Total occlusion noted in the peroneal artery. Left femoral - PT  trunk bypasss is occluded with reconstituted flow distally.  MEDICAL ISSUES:   PAD: I discussed with the patient that her SFA stents on the right are occluded.  She would need surgical revascularization if she develops a wound.  I would hold off on bypass for claudication at this time.  Her biggest complaint is a burning shooting pain under her right great toe.  She did not get an adequate response with gabapentin and so I am going to start her on Lyrica today.  I have given her 1 month supply with 3 refills.  She will contact us if she is doing well with this medication and we can give her more refills.  If she is not getting any benefit, she will discontinue it after a month  Carotid: She will have a repeat ultrasound in 6 months when she returns for her ABIs.    Leia Alf, MD, FACS [facility] [phone] Pager [phone]

## 2022-08-04 DIAGNOSIS — Z681 Body mass index (BMI) 19 or less, adult: Secondary | ICD-10-CM | POA: Diagnosis not present

## 2022-08-04 DIAGNOSIS — Z79899 Other long term (current) drug therapy: Secondary | ICD-10-CM | POA: Diagnosis not present

## 2022-08-13 NOTE — Progress Notes (Signed)
PA for Roselee Nova, Ref # [id number]  PA request approved, expires 05/10/23

## 2022-08-31 DIAGNOSIS — Z681 Body mass index (BMI) 19 or less, adult: Secondary | ICD-10-CM | POA: Diagnosis not present

## 2022-08-31 DIAGNOSIS — Z79899 Other long term (current) drug therapy: Secondary | ICD-10-CM | POA: Diagnosis not present

## 2022-08-31 DIAGNOSIS — J449 Chronic obstructive pulmonary disease, unspecified: Secondary | ICD-10-CM | POA: Diagnosis not present

## 2022-09-28 DIAGNOSIS — Z79899 Other long term (current) drug therapy: Secondary | ICD-10-CM | POA: Diagnosis not present

## 2022-09-28 DIAGNOSIS — Z681 Body mass index (BMI) 19 or less, adult: Secondary | ICD-10-CM | POA: Diagnosis not present

## 2022-09-28 DIAGNOSIS — J449 Chronic obstructive pulmonary disease, unspecified: Secondary | ICD-10-CM | POA: Diagnosis not present

## 2022-10-26 DIAGNOSIS — Z681 Body mass index (BMI) 19 or less, adult: Secondary | ICD-10-CM | POA: Diagnosis not present

## 2022-10-26 DIAGNOSIS — Z79899 Other long term (current) drug therapy: Secondary | ICD-10-CM | POA: Diagnosis not present

## 2023-01-21 ENCOUNTER — Other Ambulatory Visit: Payer: Self-pay | Admitting: *Deleted

## 2023-01-21 DIAGNOSIS — I70213 Atherosclerosis of native arteries of extremities with intermittent claudication, bilateral legs: Secondary | ICD-10-CM

## 2023-01-21 DIAGNOSIS — R0989 Other specified symptoms and signs involving the circulatory and respiratory systems: Secondary | ICD-10-CM

## 2023-01-31 ENCOUNTER — Ambulatory Visit: Payer: Medicare HMO

## 2023-01-31 ENCOUNTER — Ambulatory Visit (HOSPITAL_COMMUNITY): Payer: Medicare HMO

## 2023-03-07 ENCOUNTER — Ambulatory Visit (INDEPENDENT_AMBULATORY_CARE_PROVIDER_SITE_OTHER)
Admission: RE | Admit: 2023-03-07 | Discharge: 2023-03-07 | Disposition: A | Discharge status: Home / Self Care | Payer: Medicare HMO | Source: Ambulatory Visit | Attending: Surgery | Admitting: Surgery

## 2023-03-07 ENCOUNTER — Ambulatory Visit (INDEPENDENT_AMBULATORY_CARE_PROVIDER_SITE_OTHER): Payer: Medicare HMO | Admitting: Physician Assistant

## 2023-03-07 ENCOUNTER — Ambulatory Visit (HOSPITAL_COMMUNITY)
Admission: RE | Admit: 2023-03-07 | Discharge: 2023-03-07 | Disposition: A | Discharge status: Home / Self Care | Payer: Medicare HMO | Source: Ambulatory Visit | Attending: Surgery | Admitting: Surgery

## 2023-03-07 VITALS — BP 156/82 | HR 75 | Temp 98.3°F | Ht 61.0 in | Wt 78.3 lb

## 2023-03-07 DIAGNOSIS — I70213 Atherosclerosis of native arteries of extremities with intermittent claudication, bilateral legs: Secondary | ICD-10-CM | POA: Diagnosis present

## 2023-03-07 DIAGNOSIS — R0989 Other specified symptoms and signs involving the circulatory and respiratory systems: Secondary | ICD-10-CM | POA: Diagnosis present

## 2023-03-07 DIAGNOSIS — I70221 Atherosclerosis of native arteries of extremities with rest pain, right leg: Secondary | ICD-10-CM

## 2023-03-07 DIAGNOSIS — I739 Peripheral vascular disease, unspecified: Secondary | ICD-10-CM

## 2023-03-07 LAB — VAS US ABI WITH/WO TBI
Left ABI: 0.58
Right ABI: 0

## 2023-03-07 NOTE — Progress Notes (Signed)
Office Note     CC:  follow up Requesting Provider:  Aris Everts, MD  HPI: Lauren King is a 72 y.o. (03/21/1951) female who presents for surveillance of PAD and carotid artery stenosis.  Since last office visit 6 months ago she denies any strokelike symptoms including slurring speech, changes in vision, or one-sided weakness.  As of July 2020 bilateral internal carotid arteries demonstrated 1 to 39% stenosis by duplex.  She is also complaining of rest pain in her right foot over the past 3 months.  This occurs nightly.  She also has a subtle numbness feeling in her right foot which is slowly worsening.  She has an extensive surgical history with our office.  Most recently she underwent mechanical thrombectomy of the right external iliac, common femoral, and SFA with stenting of the right external iliac and SFA due to rest pain by Dr. Myra Gianotti in November 2023.  SFA stents however are known to be occluded.  Her left leg bypass is also known to be occluded however she is without rest pain or tissue loss in the left foot currently.  She denies any tissue loss in the right foot.  She is a former smoker.  She takes an aspirin, Plavix, statin daily.  The remainder of her surgical history is as follows:  12/27/2017: Abdominal aortogram 11/21/2018: Abdominal aortogram 12/22/2018: Left femoral to tibioperoneal trunk bypass with saphenous vein (rest pain) 09/11/2019: Angioplasty left tibioperoneal trunk and peroneal artery, stent left external iliac artery rest pain 10/23/2019: Mechanical thrombectomy right external iliac and common femoral artery with stenting of the right common femoral, external iliac, and common iliac artery (rest pain) 02/02/2022: Angioplasty, left popliteal and peroneal artery, angioplasty, right common femoral artery (claudication) 03/23/2022: Mechanical thrombectomy of the right external iliac, common femoral, and superficial femoral artery, stent right superficial femoral, right  external iliac, stent, left external iliac, stent left common iliac (rest pain)   Past Medical History:  Diagnosis Date   Anxiety    Chronic lower back pain    COPD (chronic obstructive pulmonary disease) (HCC)    Depression    GERD (gastroesophageal reflux disease)    Obstructive sleep apnea    CPAP    Past Surgical History:  Procedure Laterality Date   ABDOMINAL AORTOGRAM W/LOWER EXTREMITY N/A 09/11/2019   Procedure: ABDOMINAL AORTOGRAM W/LOWER EXTREMITY;  Surgeon: Nada Libman, MD;  Location: [facility];  Service: Cardiovascular;  Laterality: N/A;   ABDOMINAL AORTOGRAM W/LOWER EXTREMITY N/A 10/23/2019   Procedure: ABDOMINAL AORTOGRAM W/LOWER EXTREMITY;  Surgeon: Nada Libman, MD;  Location: [facility];  Service: Cardiovascular;  Laterality: N/A;   ABDOMINAL AORTOGRAM W/LOWER EXTREMITY N/A 02/02/2022   Procedure: ABDOMINAL AORTOGRAM W/LOWER EXTREMITY;  Surgeon: Nada Libman, MD;  Location: [facility];  Service: Cardiovascular;  Laterality: N/A;   ABDOMINAL AORTOGRAM W/LOWER EXTREMITY N/A 03/23/2022   Procedure: ABDOMINAL AORTOGRAM W/LOWER EXTREMITY;  Surgeon: Nada Libman, MD;  Location: [facility];  Service: Cardiovascular;  Laterality: N/A;   ABDOMINAL HYSTERECTOMY     CATARACT EXTRACTION     COLONOSCOPY  01/20/2005   WGN:FAOZHYQMV colon polyp/Diminutive polyps at 30 cm ablated/Normal rectum. hyperplastic polyp   COLONOSCOPY N/A 03/25/2015   HQI:ONGEXB /left-sided diverticula   ENDARTERECTOMY FEMORAL Left 12/22/2018   Procedure: Endarterectomy Left Femoral and Politeal Arteries;  Surgeon: Nada Libman, MD;  Location: [facility];  Service: Vascular;  Laterality: Left;   ESOPHAGOGASTRODUODENOSCOPY  11/30/2004   RMR:A small patch  of salmon-colored epithelium (salmon-like lesion in the distal esophagus)/Antral pre-pyloric erosions of uncertain significance, otherwise, normal. solitary duodenal bulbar AVM.No Barrett's.    ESOPHAGOGASTRODUODENOSCOPY N/A 03/25/2015   YTK:ZSWFUXN reflux/schatzkis ring s/p dilation/HH/short segment barrett   ESOPHAGOGASTRODUODENOSCOPY (EGD) WITH ESOPHAGEAL DILATION N/A 09/22/2012   Rourk: erosive RE, biopsy negative for Barrett's. Gastritis.    FEMORAL-TIBIAL BYPASS GRAFT Left 12/22/2018   Procedure: BYPASS GRAFT FEMORAL-TIBIAL ARTERY LEFT LEG USING NONREVERSED LEFT GREAT SAPHENOUS VEIN;  Surgeon: Nada Libman, MD;  Location: [facility];  Service: Vascular;  Laterality: Left;   LOWER EXTREMITY ANGIOGRAPHY N/A 12/27/2017   Procedure: LOWER EXTREMITY ANGIOGRAPHY;  Surgeon: Nada Libman, MD;  Location: [facility];  Service: Cardiovascular;  Laterality: N/A;   LOWER EXTREMITY ANGIOGRAPHY N/A 11/21/2018   Procedure: LOWER EXTREMITY ANGIOGRAPHY;  Surgeon: Nada Libman, MD;  Location: [facility];  Service: Cardiovascular;  Laterality: N/A;   MALONEY DILATION N/A 03/25/2015   Procedure: Elease Hashimoto DILATION;  Surgeon: Corbin Ade, MD;  Location: [facility];  Service: Endoscopy;  Laterality: N/A;   PERIPHERAL VASCULAR BALLOON ANGIOPLASTY  02/02/2022   Procedure: PERIPHERAL VASCULAR BALLOON ANGIOPLASTY;  Surgeon: Nada Libman, MD;  Location: [facility];  Service: Cardiovascular;;   PERIPHERAL VASCULAR INTERVENTION Left 09/11/2019   Procedure: PERIPHERAL VASCULAR INTERVENTION;  Surgeon: Nada Libman, MD;  Location: [facility];  Service: Cardiovascular;  Laterality: Left;   PERIPHERAL VASCULAR INTERVENTION Right 10/23/2019   Procedure: PERIPHERAL VASCULAR INTERVENTION;  Surgeon: Nada Libman, MD;  Location: [facility];  Service: Cardiovascular;  Laterality: Right;  external iliac   PERIPHERAL VASCULAR INTERVENTION  03/23/2022   Procedure: PERIPHERAL VASCULAR INTERVENTION;  Surgeon: Nada Libman, MD;  Location: [facility];  Service: Cardiovascular;;   PERIPHERAL VASCULAR THROMBECTOMY  03/23/2022   Procedure: PERIPHERAL VASCULAR  THROMBECTOMY;  Surgeon: Nada Libman, MD;  Location: [facility];  Service: Cardiovascular;;   SKIN CANCER DESTRUCTION     TUBAL LIGATION     with incidental appendectomy   VEIN HARVEST Left 12/22/2018   Procedure: Vein Harvest Left Great Saphenous;  Surgeon: Nada Libman, MD;  Location: [facility] OR;  Service: Vascular;  Laterality: Left;    Social History   Socioeconomic History   Marital status: Divorced    Spouse name: Not on file   Number of children: Not on file   Years of education: Not on file   Highest education level: Not on file  Occupational History   Not on file  Tobacco Use   Smoking status: Former    Current packs/day: 0.40    Average packs/day: 0.4 packs/day for 40.0 years (16.0 ttl pk-yrs)    Types: Cigarettes   Smokeless tobacco: Never   Tobacco comments:    5 a day  Vaping Use   Vaping status: Some Days   Substances: Nicotine  Substance and Sexual Activity   Alcohol use: No   Drug use: No   Sexual activity: Not on file  Other Topics Concern   Not on file  Social History Narrative   Not on file   Social Determinants of Health   Financial Resource Strain: Not on file  Food Insecurity: Not on file  Transportation Needs: Not on file  Physical Activity: Not on file  Stress: Not on file  Social Connections: Not on file  Intimate Partner Violence: Not on file    Family History  Problem Relation Age of Onset  Lung cancer Sister    Lung cancer Brother    Colon cancer Neg Hx     Current Outpatient Medications  Medication Sig Dispense Refill   albuterol (VENTOLIN HFA) 108 (90 Base) MCG/ACT inhaler Inhale 1-2 puffs into the lungs every 6 (six) hours as needed for wheezing or shortness of breath.     aspirin EC 81 MG tablet Take 81 mg by mouth daily.     clopidogrel (PLAVIX) 75 MG tablet Take 1 tablet (75 mg total) by mouth daily. 30 tablet 6   gabapentin (NEURONTIN) 300 MG capsule Take 1 capsule (300 mg total) by mouth daily. 60 capsule 3    hydrOXYzine (ATARAX) 25 MG tablet Take 25 mg by mouth every 8 (eight) hours as needed for anxiety.     ibuprofen (ADVIL) 800 MG tablet Take 800 mg by mouth every 8 (eight) hours as needed (back pain.).      losartan-hydrochlorothiazide (HYZAAR) 50-12.5 MG tablet Take 1 tablet by mouth daily.     mirtazapine (REMERON) 30 MG tablet Take 1 tablet (30 mg total) by mouth at bedtime. (Patient not taking: Reported on 03/07/2023) 90 tablet 1   OLANZapine (ZYPREXA) 2.5 MG tablet Take 1.25 mg by mouth 2 (two) times daily.     Oxycodone HCl 10 MG TABS Take 10 mg by mouth every 4 (four) hours as needed for severe pain.     OXYGEN Inhale 2 L into the lungs daily as needed (low oxygen).      pregabalin (LYRICA) 25 MG capsule Take 1 capsule (25 mg total) by mouth 2 (two) times daily. (Patient not taking: Reported on 03/07/2023) 50 capsule 3   rosuvastatin (CRESTOR) 5 MG tablet Take 1 tablet (5 mg total) by mouth daily. 30 tablet 3   TRELEGY ELLIPTA 100-62.5-25 MCG/INH AEPB Inhale 1 puff into the lungs daily.     No current facility-administered medications for this visit.    No Known Allergies   REVIEW OF SYSTEMS:   [X]  denotes positive finding, [ ]  denotes negative finding Cardiac  Comments:  Chest pain or chest pressure:    Shortness of breath upon exertion:    Short of breath when lying flat:    Irregular heart rhythm:        Vascular    Pain in calf, thigh, or hip brought on by ambulation:    Pain in feet at night that wakes you up from your sleep:     Blood clot in your veins:    Leg swelling:         Pulmonary    Oxygen at home:    Productive cough:     Wheezing:         Neurologic    Sudden weakness in arms or legs:     Sudden numbness in arms or legs:     Sudden onset of difficulty speaking or slurred speech:    Temporary loss of vision in one eye:     Problems with dizziness:         Gastrointestinal    Blood in stool:     Vomited blood:         Genitourinary    Burning  when urinating:     Blood in urine:        Psychiatric    Major depression:         Hematologic    Bleeding problems:    Problems with blood clotting too easily:  Skin    Rashes or ulcers:        Constitutional    Fever or chills:      PHYSICAL EXAMINATION:  Vitals:   03/07/23 0948  BP: (!) 156/82  Pulse: 75  Temp: 98.3 F (36.8 C)  SpO2: 94%  Weight: 78 lb 4.8 oz (35.5 kg)  Height: 5\' 1"  (1.549 m)    General:  WDWN in NAD; vital signs documented above Gait: Not observed HENT: WNL, normocephalic Pulmonary: normal non-labored breathing , without Rales, rhonchi,  wheezing Cardiac: regular HR Abdomen: soft, NT, no masses Skin: without rashes Vascular Exam/Pulses: Soft peroneal signal right lower extremity; palpable femoral pulses bilaterally Extremities: Right foot cool to touch with low intensity rubor; motor exam symmetrical in the feet Musculoskeletal: no muscle wasting or atrophy  Neurologic: A&O X 3 Psychiatric:  The pt has Normal affect.   Non-Invasive Vascular Imaging:   ABI/TBIToday's ABIToday's TBIPrevious ABIPrevious TBI  +-------+-----------+-----------+------------+------------+  Right 0          0          0.47        0             +-------+-----------+-----------+------------+------------+  Left  0.58       0          0.56        0             +-------+-----------+-----------+------------+----------   Bilateral internal carotid arteries 1 to 39% stenosis    ASSESSMENT/PLAN:: 72 y.o. female here for follow up for surveillance of carotid artery stenosis and PAD with right leg rest pain  Subjectively she has not had any neurological symptoms since last office visit.  Carotid duplex is unchanged since study in 2020.  She has 1 to 39% stenosis of bilateral internal carotid arteries.  We will repeat carotid duplex in 2 years.  Ms. Klug has an extensive surgical history with our office.  She complains of right foot rest pain  which has been present for at least the last 3 months.  She has known occluded SFA stents.  She also has a known occluded left leg bypass however this continues to be asymptomatic.  She has palpable femoral pulses bilaterally suggesting patency of her iliac stents.  Plan will be to proceed with angiography.  Case was discussed with Dr. Myra Gianotti.  He will likely access the right common femoral artery and this will likely be a diagnostic arteriogram in preparation for right lower extremity bypass surgery.  Dr. Myra Gianotti will also evaluate right common and external iliac stenting to make sure inflow is optimized.  The patient requires advanced notice prior to procedures in order to arrange transportation with her daughter.  Angiography will be scheduled with Dr. Myra Gianotti on the next available day.   Emilie Rutter, PA-C [facility] [phone]  Clinic MD:   Myra Gianotti

## 2023-03-07 NOTE — H&P (View-Only) (Signed)
 Office Note     CC:  follow up Requesting Provider:  Aris Everts, MD  HPI: Lauren King is a 72 y.o. (03/21/1951) female who presents for surveillance of PAD and carotid artery stenosis.  Since last office visit 6 months ago she denies any strokelike symptoms including slurring speech, changes in vision, or one-sided weakness.  As of July 2020 bilateral internal carotid arteries demonstrated 1 to 39% stenosis by duplex.  She is also complaining of rest pain in her right foot over the past 3 months.  This occurs nightly.  She also has a subtle numbness feeling in her right foot which is slowly worsening.  She has an extensive surgical history with our office.  Most recently she underwent mechanical thrombectomy of the right external iliac, common femoral, and SFA with stenting of the right external iliac and SFA due to rest pain by Dr. Myra Gianotti in November 2023.  SFA stents however are known to be occluded.  Her left leg bypass is also known to be occluded however she is without rest pain or tissue loss in the left foot currently.  She denies any tissue loss in the right foot.  She is a former smoker.  She takes an aspirin, Plavix, statin daily.  The remainder of her surgical history is as follows:  12/27/2017: Abdominal aortogram 11/21/2018: Abdominal aortogram 12/22/2018: Left femoral to tibioperoneal trunk bypass with saphenous vein (rest pain) 09/11/2019: Angioplasty left tibioperoneal trunk and peroneal artery, stent left external iliac artery rest pain 10/23/2019: Mechanical thrombectomy right external iliac and common femoral artery with stenting of the right common femoral, external iliac, and common iliac artery (rest pain) 02/02/2022: Angioplasty, left popliteal and peroneal artery, angioplasty, right common femoral artery (claudication) 03/23/2022: Mechanical thrombectomy of the right external iliac, common femoral, and superficial femoral artery, stent right superficial femoral, right  external iliac, stent, left external iliac, stent left common iliac (rest pain)   Past Medical History:  Diagnosis Date   Anxiety    Chronic lower back pain    COPD (chronic obstructive pulmonary disease) (HCC)    Depression    GERD (gastroesophageal reflux disease)    Obstructive sleep apnea    CPAP    Past Surgical History:  Procedure Laterality Date   ABDOMINAL AORTOGRAM W/LOWER EXTREMITY N/A 09/11/2019   Procedure: ABDOMINAL AORTOGRAM W/LOWER EXTREMITY;  Surgeon: Nada Libman, MD;  Location: [facility];  Service: Cardiovascular;  Laterality: N/A;   ABDOMINAL AORTOGRAM W/LOWER EXTREMITY N/A 10/23/2019   Procedure: ABDOMINAL AORTOGRAM W/LOWER EXTREMITY;  Surgeon: Nada Libman, MD;  Location: [facility];  Service: Cardiovascular;  Laterality: N/A;   ABDOMINAL AORTOGRAM W/LOWER EXTREMITY N/A 02/02/2022   Procedure: ABDOMINAL AORTOGRAM W/LOWER EXTREMITY;  Surgeon: Nada Libman, MD;  Location: [facility];  Service: Cardiovascular;  Laterality: N/A;   ABDOMINAL AORTOGRAM W/LOWER EXTREMITY N/A 03/23/2022   Procedure: ABDOMINAL AORTOGRAM W/LOWER EXTREMITY;  Surgeon: Nada Libman, MD;  Location: [facility];  Service: Cardiovascular;  Laterality: N/A;   ABDOMINAL HYSTERECTOMY     CATARACT EXTRACTION     COLONOSCOPY  01/20/2005   WGN:FAOZHYQMV colon polyp/Diminutive polyps at 30 cm ablated/Normal rectum. hyperplastic polyp   COLONOSCOPY N/A 03/25/2015   HQI:ONGEXB /left-sided diverticula   ENDARTERECTOMY FEMORAL Left 12/22/2018   Procedure: Endarterectomy Left Femoral and Politeal Arteries;  Surgeon: Nada Libman, MD;  Location: [facility];  Service: Vascular;  Laterality: Left;   ESOPHAGOGASTRODUODENOSCOPY  11/30/2004   RMR:A small patch  of salmon-colored epithelium (salmon-like lesion in the distal esophagus)/Antral pre-pyloric erosions of uncertain significance, otherwise, normal. solitary duodenal bulbar AVM.No Barrett's.    ESOPHAGOGASTRODUODENOSCOPY N/A 03/25/2015   YTK:ZSWFUXN reflux/schatzkis ring s/p dilation/HH/short segment barrett   ESOPHAGOGASTRODUODENOSCOPY (EGD) WITH ESOPHAGEAL DILATION N/A 09/22/2012   Rourk: erosive RE, biopsy negative for Barrett's. Gastritis.    FEMORAL-TIBIAL BYPASS GRAFT Left 12/22/2018   Procedure: BYPASS GRAFT FEMORAL-TIBIAL ARTERY LEFT LEG USING NONREVERSED LEFT GREAT SAPHENOUS VEIN;  Surgeon: Nada Libman, MD;  Location: [facility];  Service: Vascular;  Laterality: Left;   LOWER EXTREMITY ANGIOGRAPHY N/A 12/27/2017   Procedure: LOWER EXTREMITY ANGIOGRAPHY;  Surgeon: Nada Libman, MD;  Location: [facility];  Service: Cardiovascular;  Laterality: N/A;   LOWER EXTREMITY ANGIOGRAPHY N/A 11/21/2018   Procedure: LOWER EXTREMITY ANGIOGRAPHY;  Surgeon: Nada Libman, MD;  Location: [facility];  Service: Cardiovascular;  Laterality: N/A;   MALONEY DILATION N/A 03/25/2015   Procedure: Elease Hashimoto DILATION;  Surgeon: Corbin Ade, MD;  Location: [facility];  Service: Endoscopy;  Laterality: N/A;   PERIPHERAL VASCULAR BALLOON ANGIOPLASTY  02/02/2022   Procedure: PERIPHERAL VASCULAR BALLOON ANGIOPLASTY;  Surgeon: Nada Libman, MD;  Location: [facility];  Service: Cardiovascular;;   PERIPHERAL VASCULAR INTERVENTION Left 09/11/2019   Procedure: PERIPHERAL VASCULAR INTERVENTION;  Surgeon: Nada Libman, MD;  Location: [facility];  Service: Cardiovascular;  Laterality: Left;   PERIPHERAL VASCULAR INTERVENTION Right 10/23/2019   Procedure: PERIPHERAL VASCULAR INTERVENTION;  Surgeon: Nada Libman, MD;  Location: [facility];  Service: Cardiovascular;  Laterality: Right;  external iliac   PERIPHERAL VASCULAR INTERVENTION  03/23/2022   Procedure: PERIPHERAL VASCULAR INTERVENTION;  Surgeon: Nada Libman, MD;  Location: [facility];  Service: Cardiovascular;;   PERIPHERAL VASCULAR THROMBECTOMY  03/23/2022   Procedure: PERIPHERAL VASCULAR  THROMBECTOMY;  Surgeon: Nada Libman, MD;  Location: [facility];  Service: Cardiovascular;;   SKIN CANCER DESTRUCTION     TUBAL LIGATION     with incidental appendectomy   VEIN HARVEST Left 12/22/2018   Procedure: Vein Harvest Left Great Saphenous;  Surgeon: Nada Libman, MD;  Location: [facility] OR;  Service: Vascular;  Laterality: Left;    Social History   Socioeconomic History   Marital status: Divorced    Spouse name: Not on file   Number of children: Not on file   Years of education: Not on file   Highest education level: Not on file  Occupational History   Not on file  Tobacco Use   Smoking status: Former    Current packs/day: 0.40    Average packs/day: 0.4 packs/day for 40.0 years (16.0 ttl pk-yrs)    Types: Cigarettes   Smokeless tobacco: Never   Tobacco comments:    5 a day  Vaping Use   Vaping status: Some Days   Substances: Nicotine  Substance and Sexual Activity   Alcohol use: No   Drug use: No   Sexual activity: Not on file  Other Topics Concern   Not on file  Social History Narrative   Not on file   Social Determinants of Health   Financial Resource Strain: Not on file  Food Insecurity: Not on file  Transportation Needs: Not on file  Physical Activity: Not on file  Stress: Not on file  Social Connections: Not on file  Intimate Partner Violence: Not on file    Family History  Problem Relation Age of Onset  Lung cancer Sister    Lung cancer Brother    Colon cancer Neg Hx     Current Outpatient Medications  Medication Sig Dispense Refill   albuterol (VENTOLIN HFA) 108 (90 Base) MCG/ACT inhaler Inhale 1-2 puffs into the lungs every 6 (six) hours as needed for wheezing or shortness of breath.     aspirin EC 81 MG tablet Take 81 mg by mouth daily.     clopidogrel (PLAVIX) 75 MG tablet Take 1 tablet (75 mg total) by mouth daily. 30 tablet 6   gabapentin (NEURONTIN) 300 MG capsule Take 1 capsule (300 mg total) by mouth daily. 60 capsule 3    hydrOXYzine (ATARAX) 25 MG tablet Take 25 mg by mouth every 8 (eight) hours as needed for anxiety.     ibuprofen (ADVIL) 800 MG tablet Take 800 mg by mouth every 8 (eight) hours as needed (back pain.).      losartan-hydrochlorothiazide (HYZAAR) 50-12.5 MG tablet Take 1 tablet by mouth daily.     mirtazapine (REMERON) 30 MG tablet Take 1 tablet (30 mg total) by mouth at bedtime. (Patient not taking: Reported on 03/07/2023) 90 tablet 1   OLANZapine (ZYPREXA) 2.5 MG tablet Take 1.25 mg by mouth 2 (two) times daily.     Oxycodone HCl 10 MG TABS Take 10 mg by mouth every 4 (four) hours as needed for severe pain.     OXYGEN Inhale 2 L into the lungs daily as needed (low oxygen).      pregabalin (LYRICA) 25 MG capsule Take 1 capsule (25 mg total) by mouth 2 (two) times daily. (Patient not taking: Reported on 03/07/2023) 50 capsule 3   rosuvastatin (CRESTOR) 5 MG tablet Take 1 tablet (5 mg total) by mouth daily. 30 tablet 3   TRELEGY ELLIPTA 100-62.5-25 MCG/INH AEPB Inhale 1 puff into the lungs daily.     No current facility-administered medications for this visit.    No Known Allergies   REVIEW OF SYSTEMS:   [X]  denotes positive finding, [ ]  denotes negative finding Cardiac  Comments:  Chest pain or chest pressure:    Shortness of breath upon exertion:    Short of breath when lying flat:    Irregular heart rhythm:        Vascular    Pain in calf, thigh, or hip brought on by ambulation:    Pain in feet at night that wakes you up from your sleep:     Blood clot in your veins:    Leg swelling:         Pulmonary    Oxygen at home:    Productive cough:     Wheezing:         Neurologic    Sudden weakness in arms or legs:     Sudden numbness in arms or legs:     Sudden onset of difficulty speaking or slurred speech:    Temporary loss of vision in one eye:     Problems with dizziness:         Gastrointestinal    Blood in stool:     Vomited blood:         Genitourinary    Burning  when urinating:     Blood in urine:        Psychiatric    Major depression:         Hematologic    Bleeding problems:    Problems with blood clotting too easily:  Skin    Rashes or ulcers:        Constitutional    Fever or chills:      PHYSICAL EXAMINATION:  Vitals:   03/07/23 0948  BP: (!) 156/82  Pulse: 75  Temp: 98.3 F (36.8 C)  SpO2: 94%  Weight: 78 lb 4.8 oz (35.5 kg)  Height: 5\' 1"  (1.549 m)    General:  WDWN in NAD; vital signs documented above Gait: Not observed HENT: WNL, normocephalic Pulmonary: normal non-labored breathing , without Rales, rhonchi,  wheezing Cardiac: regular HR Abdomen: soft, NT, no masses Skin: without rashes Vascular Exam/Pulses: Soft peroneal signal right lower extremity; palpable femoral pulses bilaterally Extremities: Right foot cool to touch with low intensity rubor; motor exam symmetrical in the feet Musculoskeletal: no muscle wasting or atrophy  Neurologic: A&O X 3 Psychiatric:  The pt has Normal affect.   Non-Invasive Vascular Imaging:   ABI/TBIToday's ABIToday's TBIPrevious ABIPrevious TBI  +-------+-----------+-----------+------------+------------+  Right 0          0          0.47        0             +-------+-----------+-----------+------------+------------+  Left  0.58       0          0.56        0             +-------+-----------+-----------+------------+----------   Bilateral internal carotid arteries 1 to 39% stenosis    ASSESSMENT/PLAN:: 72 y.o. female here for follow up for surveillance of carotid artery stenosis and PAD with right leg rest pain  Subjectively she has not had any neurological symptoms since last office visit.  Carotid duplex is unchanged since study in 2020.  She has 1 to 39% stenosis of bilateral internal carotid arteries.  We will repeat carotid duplex in 2 years.  Ms. Klug has an extensive surgical history with our office.  She complains of right foot rest pain  which has been present for at least the last 3 months.  She has known occluded SFA stents.  She also has a known occluded left leg bypass however this continues to be asymptomatic.  She has palpable femoral pulses bilaterally suggesting patency of her iliac stents.  Plan will be to proceed with angiography.  Case was discussed with Dr. Myra Gianotti.  He will likely access the right common femoral artery and this will likely be a diagnostic arteriogram in preparation for right lower extremity bypass surgery.  Dr. Myra Gianotti will also evaluate right common and external iliac stenting to make sure inflow is optimized.  The patient requires advanced notice prior to procedures in order to arrange transportation with her daughter.  Angiography will be scheduled with Dr. Myra Gianotti on the next available day.   Emilie Rutter, PA-C [facility] [phone]  Clinic MD:   Myra Gianotti

## 2023-03-14 ENCOUNTER — Telehealth: Payer: Self-pay

## 2023-03-14 ENCOUNTER — Other Ambulatory Visit: Payer: Self-pay

## 2023-03-14 DIAGNOSIS — I70221 Atherosclerosis of native arteries of extremities with rest pain, right leg: Secondary | ICD-10-CM

## 2023-03-14 DIAGNOSIS — I739 Peripheral vascular disease, unspecified: Secondary | ICD-10-CM

## 2023-03-14 NOTE — Telephone Encounter (Signed)
Attempted to reach pt to schedule her AGM. One of her lines had a full mailbox; however, I left VM on different line, asking that she return our call.

## 2023-03-29 ENCOUNTER — Ambulatory Visit (HOSPITAL_COMMUNITY)
Admission: RE | Admit: 2023-03-29 | Discharge: 2023-03-29 | Disposition: A | Discharge status: Home / Self Care | Payer: Medicare HMO | Attending: Surgery | Admitting: Surgery

## 2023-03-29 ENCOUNTER — Encounter (HOSPITAL_COMMUNITY): Payer: Self-pay | Admitting: Surgery

## 2023-03-29 ENCOUNTER — Other Ambulatory Visit: Payer: Self-pay

## 2023-03-29 ENCOUNTER — Encounter (HOSPITAL_COMMUNITY)
Admission: RE | Disposition: A | Discharge status: Home / Self Care | Payer: Self-pay | Source: Home / Self Care | Attending: Surgery

## 2023-03-29 DIAGNOSIS — Z7902 Long term (current) use of antithrombotics/antiplatelets: Secondary | ICD-10-CM | POA: Diagnosis not present

## 2023-03-29 DIAGNOSIS — Z9889 Other specified postprocedural states: Secondary | ICD-10-CM | POA: Diagnosis not present

## 2023-03-29 DIAGNOSIS — Z79899 Other long term (current) drug therapy: Secondary | ICD-10-CM | POA: Insufficient documentation

## 2023-03-29 DIAGNOSIS — I70221 Atherosclerosis of native arteries of extremities with rest pain, right leg: Secondary | ICD-10-CM | POA: Diagnosis present

## 2023-03-29 DIAGNOSIS — I708 Atherosclerosis of other arteries: Secondary | ICD-10-CM

## 2023-03-29 DIAGNOSIS — I739 Peripheral vascular disease, unspecified: Secondary | ICD-10-CM

## 2023-03-29 DIAGNOSIS — Z7982 Long term (current) use of aspirin: Secondary | ICD-10-CM | POA: Insufficient documentation

## 2023-03-29 DIAGNOSIS — Z95828 Presence of other vascular implants and grafts: Secondary | ICD-10-CM | POA: Insufficient documentation

## 2023-03-29 DIAGNOSIS — I70202 Unspecified atherosclerosis of native arteries of extremities, left leg: Secondary | ICD-10-CM | POA: Diagnosis not present

## 2023-03-29 DIAGNOSIS — Z87891 Personal history of nicotine dependence: Secondary | ICD-10-CM | POA: Diagnosis not present

## 2023-03-29 DIAGNOSIS — I6523 Occlusion and stenosis of bilateral carotid arteries: Secondary | ICD-10-CM | POA: Insufficient documentation

## 2023-03-29 HISTORY — PX: ABDOMINAL AORTOGRAM W/LOWER EXTREMITY: CATH118223

## 2023-03-29 LAB — POCT I-STAT, CHEM 8
BUN: 29 mg/dL — ABNORMAL HIGH (ref 8–23)
Calcium, Ion: 1.21 mmol/L (ref 1.15–1.40)
Chloride: 102 mmol/L (ref 98–111)
Creatinine, Ser: 0.8 mg/dL (ref 0.44–1.00)
Glucose, Bld: 131 mg/dL — ABNORMAL HIGH (ref 70–99)
HCT: 43 % (ref 36.0–46.0)
Hemoglobin: 14.6 g/dL (ref 12.0–15.0)
Potassium: 4.1 mmol/L (ref 3.5–5.1)
Sodium: 138 mmol/L (ref 135–145)
TCO2: 26 mmol/L (ref 22–32)

## 2023-03-29 SURGERY — ABDOMINAL AORTOGRAM W/LOWER EXTREMITY
Anesthesia: LOCAL

## 2023-03-29 MED ORDER — MIDAZOLAM HCL 2 MG/2ML IJ SOLN
INTRAMUSCULAR | Status: AC
  Filled 2023-03-29: qty 2

## 2023-03-29 MED ORDER — LIDOCAINE HCL (PF) 1 % IJ SOLN
INTRAMUSCULAR | Status: AC
  Filled 2023-03-29: qty 30

## 2023-03-29 MED ORDER — ACETAMINOPHEN 325 MG PO TABS: 650.0000 mg | ORAL_TABLET | ORAL | Status: DC | PRN

## 2023-03-29 MED ORDER — LIDOCAINE HCL (PF) 1 % IJ SOLN
INTRAMUSCULAR | Status: DC | PRN
  Administered 2023-03-29: 15 mL via INTRADERMAL

## 2023-03-29 MED ORDER — SODIUM CHLORIDE 0.9 % IV SOLN: INTRAVENOUS | Status: DC

## 2023-03-29 MED ORDER — SODIUM CHLORIDE 0.9 % IV SOLN: 250.0000 mL | INTRAVENOUS | Status: DC | PRN

## 2023-03-29 MED ORDER — LABETALOL HCL 5 MG/ML IV SOLN: 10.0000 mg | INTRAVENOUS | Status: DC | PRN

## 2023-03-29 MED ORDER — MORPHINE SULFATE (PF) 2 MG/ML IV SOLN: 2.0000 mg | INTRAVENOUS | Status: DC | PRN

## 2023-03-29 MED ORDER — HEPARIN (PORCINE) IN NACL 1000-0.9 UT/500ML-% IV SOLN
INTRAVENOUS | Status: DC | PRN
  Administered 2023-03-29 (×2): 500 mL

## 2023-03-29 MED ORDER — FENTANYL CITRATE (PF) 100 MCG/2ML IJ SOLN
INTRAMUSCULAR | Status: DC | PRN
  Administered 2023-03-29: 50 ug via INTRAVENOUS

## 2023-03-29 MED ORDER — ONDANSETRON HCL 4 MG/2ML IJ SOLN: 4.0000 mg | Freq: Four times a day (QID) | INTRAMUSCULAR | Status: DC | PRN

## 2023-03-29 MED ORDER — OXYCODONE HCL 5 MG PO TABS: 5.0000 mg | ORAL_TABLET | ORAL | Status: DC | PRN

## 2023-03-29 MED ORDER — FENTANYL CITRATE (PF) 100 MCG/2ML IJ SOLN
INTRAMUSCULAR | Status: AC
  Filled 2023-03-29: qty 2

## 2023-03-29 MED ORDER — SODIUM CHLORIDE 0.9% FLUSH: 3.0000 mL | Freq: Two times a day (BID) | INTRAVENOUS | Status: DC

## 2023-03-29 MED ORDER — MIDAZOLAM HCL 2 MG/2ML IJ SOLN
INTRAMUSCULAR | Status: DC | PRN
  Administered 2023-03-29: 1 mg via INTRAVENOUS

## 2023-03-29 MED ORDER — HYDRALAZINE HCL 20 MG/ML IJ SOLN: 5.0000 mg | INTRAMUSCULAR | Status: DC | PRN

## 2023-03-29 MED ORDER — SODIUM CHLORIDE 0.9 % WEIGHT BASED INFUSION
1.0000 mL/kg/h | INTRAVENOUS | Status: DC
Start: 2023-03-29 — End: 2023-03-29

## 2023-03-29 MED ORDER — IODIXANOL 320 MG/ML IV SOLN
INTRAVENOUS | Status: DC | PRN
  Administered 2023-03-29: 90 mL via INTRA_ARTERIAL

## 2023-03-29 MED ORDER — SODIUM CHLORIDE 0.9% FLUSH: 3.0000 mL | INTRAVENOUS | Status: DC | PRN

## 2023-03-29 SURGICAL SUPPLY — 10 items
CATH OMNI FLUSH 5F 65CM (CATHETERS) IMPLANT
COVER DOME SNAP 22 D (MISCELLANEOUS) IMPLANT
KIT MICROPUNCTURE NIT STIFF (SHEATH) IMPLANT
KIT SYRINGE INJ CVI SPIKEX1 (MISCELLANEOUS) IMPLANT
SET ATX-X65L (MISCELLANEOUS) IMPLANT
SHEATH PINNACLE 5F 10CM (SHEATH) IMPLANT
SHEATH PROBE COVER 6X72 (BAG) IMPLANT
TRANSDUCER W/STOPCOCK (MISCELLANEOUS) ×1 IMPLANT
TRAY PV CATH (CUSTOM PROCEDURE TRAY) ×1 IMPLANT
WIRE STARTER BENTSON 035X150 (WIRE) IMPLANT

## 2023-03-29 NOTE — Progress Notes (Signed)
Site area: left groin Site Prior to Removal:  Level 0 Pressure Applied For: 25 minutes Manual:   yes Patient Status During Pull:  stable Post Pull Site:  Level 0 Post Pull Instructions Given:  yes Post Pull Pulses Present: left pt dopplered pre and post sheath pull Dressing Applied:  gauze and tegaderm Bedrest begins @ 0915 Comments:

## 2023-03-29 NOTE — Op Note (Signed)
    Patient name: Lauren King MRN: [id number] DOB: 1951-05-01 Sex: female  03/29/2023 Pre-operative Diagnosis: Right leg rest pain Post-operative diagnosis:  Same Surgeon:  [name] Procedure Performed:  1.  Ultrasound-guided access, left femoral artery  2.  Abdominal aortogram with bifemoral runoff  3.  Bilateral lower extremity angiogram  4.  Selective injection with catheter in right external iliac artery from left femoral artery  5.  Conscious sedation, 28 minutes  Indications: This is a 72 year old female with multiple prior interventions who comes in today with a 35-month history of rest pain in the right leg  Procedure:  The patient was identified in the holding area and taken to room 8.  The patient was then placed supine on the table and prepped and draped in the usual sterile fashion.  A time out was called.  Conscious sedation was administered with the use of IV fentanyl and Versed under continuous physician and nurse monitoring.  Heart rate, blood pressure, and oxygen saturation were continuously monitored.  Total sedation time was 28 minutes.  Ultrasound was used to evaluate the left common femoral artery.  It was patent .  A digital ultrasound image was acquired.  A micropuncture needle was used to access the left common femoral artery under ultrasound guidance.  An 018 wire was advanced without resistance and a micropuncture sheath was placed.  The 018 wire was removed and a benson wire was placed.  The micropuncture sheath was exchanged for a 5 french sheath.  An omniflush catheter was advanced over the wire to the level of L-1.  An abdominal angiogram was obtained.  Next, using the omniflush catheter and a benson wire, the aortic bifurcation was crossed and the catheter was placed into theright external iliac artery and right runoff was obtained.  left runoff was performed via retrograde sheath injections.  Findings:   Aortogram: No significant renal artery stenosis was  visualized.  The infrarenal abdominal aorta is small in caliber but widely patent.  Ostial bilateral common iliac stenosis greater than 50%.  The remaining bilateral common and external iliac stents are widely patent.  There is luminal narrowing in both common femoral arteries greater than 50%  Right Lower Extremity: Greater than 50% stenosis within the right common femoral artery.  The profundofemoral artery is patent without stenosis.  The superficial femoral artery is occluded.  There is reconstitution of the peroneal artery which is the single-vessel runoff  Left Lower Extremity: Stenosis greater than 50% within the left common femoral artery.  The profundofemoral artery is patent without stenosis.  The superficial femoral artery is occluded.  There is reconstitution of the posterior tibial artery which is the single-vessel runoff  Intervention: None  Impression:  #1  Greater than 70% bilateral common iliac stenosis  #2  Bilateral superficial femoral artery occlusion  #3  Bilateral common femoral artery stenosis greater than 50%  #4  Reconstitution of the right peroneal artery as the single-vessel runoff  #5  Reconstitution of the left posterior tibial artery as the single-vessel runoff  #6:  Patient will be considered for bilateral femoral endarterectomy withAFX stenting to treat the common iliac stenosis followed by a right femoral peroneal bypass   V. [name], M.D., [facility] [place] Office: [phone] Pager:  [phone]

## 2023-03-29 NOTE — Interval H&P Note (Signed)
History and Physical Interval Note:  03/29/2023 7:34 AM  Lauren King  has presented today for surgery, with the diagnosis of ischemia with rest pain.  The various methods of treatment have been discussed with the patient and family. After consideration of risks, benefits and other options for treatment, the patient has consented to  Procedure(s): ABDOMINAL AORTOGRAM W/LOWER EXTREMITY (N/A) as a surgical intervention.  The patient's history has been reviewed, patient examined, no change in status, stable for surgery.  I have reviewed the patient's chart and labs.  Questions were answered to the patient's satisfaction.     [name]

## 2023-03-29 NOTE — Discharge Instructions (Signed)
 Femoral Site Care The following information offers guidance on how to care for yourself after your procedure. Your health care provider may also give you more specific instructions. If you have problems or questions, contact your health care provider. What can I expect after the procedure? After the procedure, it is common to have bruising and tenderness at the incision site. This usually fades within 1-2 weeks. Follow these instructions at home: Incision site care  Follow instructions from your health care provider about how to take care of your incision site. Make sure you: Wash your hands with soap and water for at least 20 seconds before and after you change your bandage (dressing). If soap and water are not available, use hand sanitizer. Remove your dressing in 24 hours. Leave stitches (sutures), skin glue, or adhesive strips in place. These skin closures may need to stay in place for 2 weeks or longer. If adhesive strip edges start to loosen and curl up, you may trim the loose edges. Do not remove adhesive strips completely unless your health care provider tells you to do that. Do not take baths, swim, or use a hot tub for at least 1 week. You may shower 24 hours after the procedure or as told by your health care provider. Gently wash the incision site with plain soap and water. Pat the area dry with a clean towel. Do not rub the site. This may cause bleeding. Do not apply powder or lotion to the site. Keep the site clean and dry. Check your femoral site every day for signs of infection. Check for: Redness, swelling, or pain. Fluid or blood. Warmth. Pus or a bad smell. Activity If you were given a sedative during the procedure, it can affect you for several hours. Do not drive or operate machinery until your health care provider says that it is safe. Rest as told by your health care provider. Avoid sitting for a long time without moving. Get up to take short walks every 1-2 hours. This  is important to improve blood flow and breathing. Ask for help if you feel weak or unsteady. Return to your normal activities as told by your health care provider. Ask your health care provider what activities are safe for you and when you can return to work. Avoid activities that take a lot of effort for the first 2-3 days after your procedure, or as long as directed. Do not lift anything that is heavier than 10 lb (4.5 kg), or the limit that you are told, until your health care provider says that it is safe. General instructions Take over-the-counter and prescription medicines only as told by your health care provider. If you will be going home right after the procedure, plan to have a responsible adult care for you for the time you are told. This is important. Keep all follow-up visits. This is important. Contact a health care provider if: You have a fever or chills. You have any of these signs of infection at your incision site: Redness, swelling, or pain. Fluid or blood. Warmth. Pus or a bad smell. Get help right away if: The incision area swells very fast. The incision area is bleeding, and the bleeding does not stop when you hold steady pressure on the area. Your leg or foot becomes pale, cool, tingly, or numb. These symptoms may represent a serious problem that is an emergency. Do not wait to see if the symptoms will go away. Get medical help right away. Call your local emergency  services (911 in the [place] Do not drive yourself to the hospital. Summary After the procedure, it is common to have bruising and tenderness that fade within 1-2 weeks. Check your femoral site every day for signs of infection. Do not lift anything that is heavier than 10 lb (4.5 kg), or the limit that you are told, until your health care provider says that it is safe. Get help right away if the incision area swells very fast, you have bleeding at the incision area that does not stop, or your leg or foot  becomes pale, cool, or numb. This information is not intended to replace advice given to you by your health care provider. Make sure you discuss any questions you have with your health care provider. Document Revised: 01/14/2021 Document Reviewed: 06/15/2020 Elsevier Patient Education  2024 [employer].

## 2023-03-30 ENCOUNTER — Other Ambulatory Visit: Payer: Self-pay

## 2023-03-30 DIAGNOSIS — I70221 Atherosclerosis of native arteries of extremities with rest pain, right leg: Secondary | ICD-10-CM

## 2023-03-30 DIAGNOSIS — I739 Peripheral vascular disease, unspecified: Secondary | ICD-10-CM

## 2023-05-06 ENCOUNTER — Telehealth: Payer: Self-pay

## 2023-05-06 NOTE — Telephone Encounter (Signed)
Pt called to ask about getting labs drawn. She has been advised to wait for the hospital to call her to schedule this. She is concerned that she gets five days notice for transportation. I have left a VM for PAT to let them know this. Pt is c/o pain in her RLE that is coming and going. She has been advised to call us if this persists or she experiences any color change, coolness. Pt verbalized understanding.

## 2023-05-18 NOTE — Pre-Procedure Instructions (Addendum)
 Surgical Instructions   Your procedure is scheduled on May 27, 2023. Report to [facility] Main Entrance A at 5:30 A.M., then check in with the Admitting office. Any questions or running late day of surgery: call [phone]  Questions prior to your surgery date: call [phone], Monday-Friday, 8am-4pm. If you experience any cold or flu symptoms such as cough, fever, chills, shortness of breath, etc. between now and your scheduled surgery, please notify us  at the above number.     Remember:  Do not eat or drink after midnight the night before your surgery    Take these medicines the morning of surgery with A SIP OF WATER : aspirin  EC    May take these medicines IF NEEDED: albuterol  (VENTOLIN  HFA) inhaler - please bring inhaler with you morning of surgery Oxycodone  HCl  TRELEGY ELLIPTA inhaler   STOP taking your clopidogrel  (PLAVIX ) five days prior to surgery. Your last dose will be January 11th.   One week prior to surgery, STOP taking any Aleve, Naproxen, Ibuprofen, Motrin, Advil, Goody's, BC's, all herbal medications, fish oil, and non-prescription vitamins.                     Do NOT Smoke (Tobacco/Vaping) for 24 hours prior to your procedure.  If you use a CPAP at night, you may bring your mask/headgear for your overnight stay.   You will be asked to remove any contacts, glasses, piercing's, hearing aid's, dentures/partials prior to surgery. Please bring cases for these items if needed.    Patients discharged the day of surgery will not be allowed to drive home, and someone needs to stay with them for 24 hours.  SURGICAL WAITING ROOM VISITATION Patients may have no more than 2 support people in the waiting area - these visitors may rotate.   Pre-op nurse will coordinate an appropriate time for 1 ADULT support person, who may not rotate, to accompany patient in pre-op.  Children under the age of 50 must have an adult with them who is not the patient and must remain  in the main waiting area with an adult.  If the patient needs to stay at the hospital during part of their recovery, the visitor guidelines for inpatient rooms apply.  Please refer to the [facility] website for the visitor guidelines for any additional information.   If you received a COVID test during your pre-op visit  it is requested that you wear a mask when out in public, stay away from anyone that may not be feeling well and notify your surgeon if you develop symptoms. If you have been in contact with anyone that has tested positive in the last 10 days please notify you surgeon.      Pre-operative CHG Bathing Instructions   You can play a key role in reducing the risk of infection after surgery. Your skin needs to be as free of germs as possible. You can reduce the number of germs on your skin by washing with CHG (chlorhexidine  gluconate) soap before surgery. CHG is an antiseptic soap that kills germs and continues to kill germs even after washing.   DO NOT use if you have an allergy to chlorhexidine /CHG or antibacterial soaps. If your skin becomes reddened or irritated, stop using the CHG and notify one of our RNs at [phone].              TAKE A SHOWER THE NIGHT BEFORE SURGERY AND THE DAY OF SURGERY    Please keep  in mind the following:  DO NOT shave, including legs and underarms, 48 hours prior to surgery.   You may shave your face before/day of surgery.  Place clean sheets on your bed the night before surgery Use a clean washcloth (not used since being washed) for each shower. DO NOT sleep with pet's night before surgery.  CHG Shower Instructions:  Wash your face and private area with normal soap. If you choose to wash your hair, wash first with your normal shampoo.  After you use shampoo/soap, rinse your hair and body thoroughly to remove shampoo/soap residue.  Turn the water  OFF and apply half the bottle of CHG soap to a CLEAN washcloth.  Apply CHG soap ONLY FROM YOUR  NECK DOWN TO YOUR TOES (washing for 3-5 minutes)  DO NOT use CHG soap on face, private areas, open wounds, or sores.  Pay special attention to the area where your surgery is being performed.  If you are having back surgery, having someone wash your back for you may be helpful. Wait 2 minutes after CHG soap is applied, then you may rinse off the CHG soap.  Pat dry with a clean towel  Put on clean pajamas    Additional instructions for the day of surgery: DO NOT APPLY any lotions, deodorants, cologne, or perfumes.   Do not wear jewelry or makeup Do not wear nail polish, gel polish, artificial nails, or any other type of covering on natural nails (fingers and toes) Do not bring valuables to the hospital. [facility] is not responsible for valuables/personal belongings. Put on clean/comfortable clothes.  Please brush your teeth.  Ask your nurse before applying any prescription medications to the skin.

## 2023-05-19 ENCOUNTER — Encounter (HOSPITAL_COMMUNITY)
Admission: RE | Admit: 2023-05-19 | Discharge: 2023-05-19 | Disposition: A | Discharge status: Home / Self Care | Payer: Medicare HMO | Source: Ambulatory Visit | Attending: Surgery | Admitting: Surgery

## 2023-05-19 ENCOUNTER — Other Ambulatory Visit: Payer: Self-pay

## 2023-05-19 ENCOUNTER — Encounter (HOSPITAL_COMMUNITY): Payer: Self-pay

## 2023-05-19 VITALS — BP 141/95 | HR 77 | Temp 98.2°F | Resp 18 | Ht 61.0 in | Wt 77.2 lb

## 2023-05-19 DIAGNOSIS — I739 Peripheral vascular disease, unspecified: Secondary | ICD-10-CM

## 2023-05-19 DIAGNOSIS — Z01812 Encounter for preprocedural laboratory examination: Secondary | ICD-10-CM | POA: Insufficient documentation

## 2023-05-19 DIAGNOSIS — Z01818 Encounter for other preprocedural examination: Secondary | ICD-10-CM

## 2023-05-19 DIAGNOSIS — I70221 Atherosclerosis of native arteries of extremities with rest pain, right leg: Secondary | ICD-10-CM | POA: Insufficient documentation

## 2023-05-19 HISTORY — DX: Peripheral vascular disease, unspecified: I73.9

## 2023-05-19 HISTORY — DX: Essential (primary) hypertension: I10

## 2023-05-19 LAB — CBC
HCT: 41.1 % (ref 36.0–46.0)
Hemoglobin: 13.3 g/dL (ref 12.0–15.0)
MCH: 30.2 pg (ref 26.0–34.0)
MCHC: 32.4 g/dL (ref 30.0–36.0)
MCV: 93.2 fL (ref 80.0–100.0)
Platelets: 403 10*3/uL — ABNORMAL HIGH (ref 150–400)
RBC: 4.41 MIL/uL (ref 3.87–5.11)
RDW: 14.2 % (ref 11.5–15.5)
WBC: 12 10*3/uL — ABNORMAL HIGH (ref 4.0–10.5)
nRBC: 0 % (ref 0.0–0.2)

## 2023-05-19 LAB — COMPREHENSIVE METABOLIC PANEL
ALT: 27 U/L (ref 0–44)
AST: 31 U/L (ref 15–41)
Albumin: 3.7 g/dL (ref 3.5–5.0)
Alkaline Phosphatase: 88 U/L (ref 38–126)
Anion gap: 11 (ref 5–15)
BUN: 11 mg/dL (ref 8–23)
CO2: 24 mmol/L (ref 22–32)
Calcium: 9.3 mg/dL (ref 8.9–10.3)
Chloride: 99 mmol/L (ref 98–111)
Creatinine, Ser: 0.62 mg/dL (ref 0.44–1.00)
GFR, Estimated: >60 mL/min (ref >60)
Glucose, Bld: 90 mg/dL (ref 70–99)
Potassium: 4.8 mmol/L (ref 3.5–5.1)
Sodium: 134 mmol/L — ABNORMAL LOW (ref 135–145)
Total Bilirubin: 0.5 mg/dL (ref 0.0–1.2)
Total Protein: 7.1 g/dL (ref 6.5–8.1)

## 2023-05-19 LAB — URINALYSIS, ROUTINE W REFLEX MICROSCOPIC
Bilirubin Urine: NEGATIVE
Glucose, UA: NEGATIVE mg/dL
Ketones, ur: NEGATIVE mg/dL
Leukocytes,Ua: NEGATIVE
Nitrite: NEGATIVE
Protein, ur: NEGATIVE mg/dL
Specific Gravity, Urine: 1.009 (ref 1.005–1.030)
pH: 6 (ref 5.0–8.0)

## 2023-05-19 LAB — SURGICAL PCR SCREEN
MRSA, PCR: DETECTED — AB
Staphylococcus aureus: DETECTED — AB

## 2023-05-19 LAB — PROTIME-INR
INR: 1 (ref 0.8–1.2)
Prothrombin Time: 13.8 s (ref 11.4–15.2)

## 2023-05-19 LAB — APTT: aPTT: 27 s (ref 24–36)

## 2023-05-19 NOTE — Progress Notes (Signed)
 PCP - Dr. Taft Mina Cardiologist - denies  PPM/ICD - denies   Chest x-ray - 01/16/22 EKG - 03/29/23 Stress Test - 12/20/18 ECHO - 09/22/10 Cardiac Cath - denies  Sleep Study - OSA+ CPAP - denies, pt states someone stole her CPAP and she hasn't gotten another one  DM- denies  Last dose of GLP1 agonist-  n/a   Blood Thinner Instructions: hold Plavix  5 days. Last dose 1/11 Aspirin  Instructions: continue on DOS  ERAS Protcol - no, NPO   COVID TEST- n/a   Anesthesia review: no  Patient denies shortness of breath, fever, cough and chest pain at PAT appointment   All instructions explained to the patient, with a verbal understanding of the material. Patient agrees to go over the instructions while at home for a better understanding.  The opportunity to ask questions was provided.

## 2023-05-19 NOTE — Pre-Procedure Instructions (Signed)
 Surgical Instructions   Your procedure is scheduled on May 27, 2023. Report to [facility] Main Entrance A at 5:30 A.M., then check in with the Admitting office. Any questions or running late day of surgery: call [phone]  Questions prior to your surgery date: call [phone], Monday-Friday, 8am-4pm. If you experience any cold or flu symptoms such as cough, fever, chills, shortness of breath, etc. between now and your scheduled surgery, please notify us  at the above number.     Remember:  Do not eat or drink after midnight the night before your surgery    Take these medicines the morning of surgery with A SIP OF WATER : aspirin  EC    May take these medicines IF NEEDED: albuterol  (VENTOLIN  HFA) inhaler - please bring inhaler with you morning of surgery Oxycodone  HCl  TRELEGY ELLIPTA inhaler   STOP taking your clopidogrel  (PLAVIX ) five days prior to surgery. Your last dose will be January 11th.   One week prior to surgery, STOP taking any Aleve, Naproxen, Ibuprofen, Motrin, Advil, Goody's, BC's, all herbal medications, fish oil, and non-prescription vitamins.                     Do NOT Smoke (Tobacco/Vaping) for 24 hours prior to your procedure.  If you use a CPAP at night, you may bring your mask/headgear for your overnight stay.   You will be asked to remove any contacts, glasses, piercing's, hearing aid's, dentures/partials prior to surgery. Please bring cases for these items if needed.    Patients discharged the day of surgery will not be allowed to drive home, and someone needs to stay with them for 24 hours.  SURGICAL WAITING ROOM VISITATION Patients may have no more than 2 support people in the waiting area - these visitors may rotate.   Pre-op nurse will coordinate an appropriate time for 1 ADULT support person, who may not rotate, to accompany patient in pre-op.  Children under the age of 50 must have an adult with them who is not the patient and must remain  in the main waiting area with an adult.  If the patient needs to stay at the hospital during part of their recovery, the visitor guidelines for inpatient rooms apply.  Please refer to the [facility] website for the visitor guidelines for any additional information.   If you received a COVID test during your pre-op visit  it is requested that you wear a mask when out in public, stay away from anyone that may not be feeling well and notify your surgeon if you develop symptoms. If you have been in contact with anyone that has tested positive in the last 10 days please notify you surgeon.      Pre-operative CHG Bathing Instructions   You can play a key role in reducing the risk of infection after surgery. Your skin needs to be as free of germs as possible. You can reduce the number of germs on your skin by washing with CHG (chlorhexidine  gluconate) soap before surgery. CHG is an antiseptic soap that kills germs and continues to kill germs even after washing.   DO NOT use if you have an allergy to chlorhexidine /CHG or antibacterial soaps. If your skin becomes reddened or irritated, stop using the CHG and notify one of our RNs at [phone].              TAKE A SHOWER THE NIGHT BEFORE SURGERY AND THE DAY OF SURGERY    Please keep  in mind the following:  DO NOT shave, including legs and underarms, 48 hours prior to surgery.   You may shave your face before/day of surgery.  Place clean sheets on your bed the night before surgery Use a clean washcloth (not used since being washed) for each shower. DO NOT sleep with pet's night before surgery.  CHG Shower Instructions:  Wash your face and private area with normal soap. If you choose to wash your hair, wash first with your normal shampoo.  After you use shampoo/soap, rinse your hair and body thoroughly to remove shampoo/soap residue.  Turn the water  OFF and apply half the bottle of CHG soap to a CLEAN washcloth.  Apply CHG soap ONLY FROM YOUR  NECK DOWN TO YOUR TOES (washing for 3-5 minutes)  DO NOT use CHG soap on face, private areas, open wounds, or sores.  Pay special attention to the area where your surgery is being performed.  If you are having back surgery, having someone wash your back for you may be helpful. Wait 2 minutes after CHG soap is applied, then you may rinse off the CHG soap.  Pat dry with a clean towel  Put on clean pajamas    Additional instructions for the day of surgery: DO NOT APPLY any lotions, deodorants, cologne, or perfumes.   Do not wear jewelry or makeup Do not wear nail polish, gel polish, artificial nails, or any other type of covering on natural nails (fingers and toes) Do not bring valuables to the hospital. [facility] is not responsible for valuables/personal belongings. Put on clean/comfortable clothes.  Please brush your teeth.  Ask your nurse before applying any prescription medications to the skin.

## 2023-05-20 NOTE — Progress Notes (Signed)
 Cherene Julian, RN, notified of positive PCR results

## 2023-05-20 NOTE — Progress Notes (Signed)
 PAT made [place] aware pt was MRSA and MSSA positive at her PAT appt. MD aware and plans to proceed with surgery as scheduled.

## 2023-05-24 ENCOUNTER — Other Ambulatory Visit: Payer: Self-pay

## 2023-05-24 ENCOUNTER — Other Ambulatory Visit: Payer: Self-pay | Admitting: Physician Assistant

## 2023-05-24 MED ORDER — MUPIROCIN 2 % EX OINT: 1.0000 | TOPICAL_OINTMENT | Freq: Two times a day (BID) | CUTANEOUS | 0 refills | Status: DC

## 2023-05-24 NOTE — Progress Notes (Signed)
 Ordering Mupirocin ointment in nares for 5 days per Dr. Myra Gianotti, as pt was MRSA positive at her PAT appt. Pt is aware. No questions/concerns.

## 2023-05-26 NOTE — Anesthesia Preprocedure Evaluation (Addendum)
Anesthesia Evaluation  Patient identified by MRN, date of birth, ID band Patient awake    Reviewed: Allergy & Precautions, NPO status , Patient's Chart, lab work & pertinent test results  Airway Mallampati: II  TM Distance: >3 FB Neck ROM: Full    Dental  (+) Dental Advisory Given, Edentulous Upper, Missing   Pulmonary sleep apnea , COPD, former smoker   Pulmonary exam normal breath sounds clear to auscultation       Cardiovascular hypertension, + Peripheral Vascular Disease  Normal cardiovascular exam Rhythm:Regular Rate:Normal   Critical limb ischemia of right lower extremity   Neuro/Psych  PSYCHIATRIC DISORDERS Anxiety Depression    negative neurological ROS     GI/Hepatic Neg liver ROS, hiatal hernia, PUD,GERD  ,,  Endo/Other  negative endocrine ROS    Renal/GU negative Renal ROS     Musculoskeletal negative musculoskeletal ROS (+)    Abdominal   Peds  Hematology negative hematology ROS (+)   Anesthesia Other Findings   Reproductive/Obstetrics                              Anesthesia Physical Anesthesia Plan  ASA: 4  Anesthesia Plan: General   Post-op Pain Management:    Induction: Intravenous  PONV Risk Score and Plan: 3 and Ondansetron, Dexamethasone and Treatment may vary due to age or medical condition  Airway Management Planned: Oral ETT  Additional Equipment: Arterial line  Intra-op Plan:   Post-operative Plan: Extubation in OR  Informed Consent: I have reviewed the patients History and Physical, chart, labs and discussed the procedure including the risks, benefits and alternatives for the proposed anesthesia with the patient or authorized representative who has indicated his/her understanding and acceptance.     Dental advisory given  Plan Discussed with: CRNA  Anesthesia Plan Comments:          Anesthesia Quick Evaluation

## 2023-05-27 ENCOUNTER — Other Ambulatory Visit: Payer: Self-pay

## 2023-05-27 ENCOUNTER — Inpatient Hospital Stay (HOSPITAL_COMMUNITY)
Admission: RE | Admit: 2023-05-27 | Discharge: 2023-05-30 | DRG: 271 | Disposition: A | Discharge status: Home / Self Care | Payer: Medicare HMO | Attending: Surgery | Admitting: Surgery

## 2023-05-27 ENCOUNTER — Inpatient Hospital Stay (HOSPITAL_COMMUNITY): Payer: Medicare HMO

## 2023-05-27 ENCOUNTER — Encounter (HOSPITAL_COMMUNITY)
Admission: RE | Disposition: A | Discharge status: Home / Self Care | Payer: Self-pay | Source: Home / Self Care | Attending: Surgery

## 2023-05-27 ENCOUNTER — Inpatient Hospital Stay (HOSPITAL_COMMUNITY): Payer: Medicare HMO | Admitting: Anesthesiology

## 2023-05-27 ENCOUNTER — Encounter (HOSPITAL_COMMUNITY): Payer: Self-pay | Admitting: Surgery

## 2023-05-27 DIAGNOSIS — T82598A Other mechanical complication of other cardiac and vascular devices and implants, initial encounter: Secondary | ICD-10-CM | POA: Diagnosis present

## 2023-05-27 DIAGNOSIS — J449 Chronic obstructive pulmonary disease, unspecified: Secondary | ICD-10-CM

## 2023-05-27 DIAGNOSIS — I70221 Atherosclerosis of native arteries of extremities with rest pain, right leg: Secondary | ICD-10-CM

## 2023-05-27 DIAGNOSIS — I10 Essential (primary) hypertension: Secondary | ICD-10-CM | POA: Diagnosis present

## 2023-05-27 DIAGNOSIS — Z95828 Presence of other vascular implants and grafts: Secondary | ICD-10-CM | POA: Diagnosis not present

## 2023-05-27 DIAGNOSIS — I7409 Other arterial embolism and thrombosis of abdominal aorta: Principal | ICD-10-CM | POA: Diagnosis present

## 2023-05-27 DIAGNOSIS — E871 Hypo-osmolality and hyponatremia: Secondary | ICD-10-CM | POA: Diagnosis not present

## 2023-05-27 DIAGNOSIS — Z87891 Personal history of nicotine dependence: Secondary | ICD-10-CM

## 2023-05-27 DIAGNOSIS — T85858A Stenosis due to other internal prosthetic devices, implants and grafts, initial encounter: Secondary | ICD-10-CM | POA: Diagnosis not present

## 2023-05-27 DIAGNOSIS — I743 Embolism and thrombosis of arteries of the lower extremities: Secondary | ICD-10-CM | POA: Diagnosis present

## 2023-05-27 DIAGNOSIS — Y832 Surgical operation with anastomosis, bypass or graft as the cause of abnormal reaction of the patient, or of later complication, without mention of misadventure at the time of the procedure: Secondary | ICD-10-CM | POA: Diagnosis present

## 2023-05-27 DIAGNOSIS — D62 Acute posthemorrhagic anemia: Secondary | ICD-10-CM | POA: Diagnosis not present

## 2023-05-27 HISTORY — PX: ABDOMINAL AORTIC ENDOVASCULAR STENT GRAFT: SHX5707

## 2023-05-27 HISTORY — PX: BYPASS GRAFT FEMORAL-PERONEAL: SHX5762

## 2023-05-27 HISTORY — PX: ENDARTERECTOMY FEMORAL: SHX5804

## 2023-05-27 HISTORY — PX: THROMBECTOMY FEMORAL ARTERY: SHX6406

## 2023-05-27 LAB — POCT I-STAT 7, (LYTES, BLD GAS, ICA,H+H)
Acid-base deficit: 4 mmol/L — ABNORMAL HIGH (ref 0.0–2.0)
Acid-base deficit: 3 mmol/L — ABNORMAL HIGH (ref 0.0–2.0)
Acid-base deficit: 4 mmol/L — ABNORMAL HIGH (ref 0.0–2.0)
Acid-base deficit: 4 mmol/L — ABNORMAL HIGH (ref 0.0–2.0)
Acid-base deficit: 5 mmol/L — ABNORMAL HIGH (ref 0.0–2.0)
Acid-base deficit: 4 mmol/L — ABNORMAL HIGH (ref 0.0–2.0)
Bicarbonate: 23.1 mmol/L (ref 20.0–28.0)
Bicarbonate: 22.7 mmol/L (ref 20.0–28.0)
Bicarbonate: 23 mmol/L (ref 20.0–28.0)
Bicarbonate: 22.1 mmol/L (ref 20.0–28.0)
Bicarbonate: 21.2 mmol/L (ref 20.0–28.0)
Bicarbonate: 21.8 mmol/L (ref 20.0–28.0)
Calcium, Ion: 1.18 mmol/L (ref 1.15–1.40)
Calcium, Ion: 1.19 mmol/L (ref 1.15–1.40)
Calcium, Ion: 1.21 mmol/L (ref 1.15–1.40)
Calcium, Ion: 1.17 mmol/L (ref 1.15–1.40)
Calcium, Ion: 1.18 mmol/L (ref 1.15–1.40)
Calcium, Ion: 1.15 mmol/L (ref 1.15–1.40)
HCT: 33 % — ABNORMAL LOW (ref 36.0–46.0)
HCT: 30 % — ABNORMAL LOW (ref 36.0–46.0)
HCT: 26 % — ABNORMAL LOW (ref 36.0–46.0)
HCT: 24 % — ABNORMAL LOW (ref 36.0–46.0)
HCT: 24 % — ABNORMAL LOW (ref 36.0–46.0)
HCT: 27 % — ABNORMAL LOW (ref 36.0–46.0)
Hemoglobin: 11.2 g/dL — ABNORMAL LOW (ref 12.0–15.0)
Hemoglobin: 10.2 g/dL — ABNORMAL LOW (ref 12.0–15.0)
Hemoglobin: 8.8 g/dL — ABNORMAL LOW (ref 12.0–15.0)
Hemoglobin: 8.2 g/dL — ABNORMAL LOW (ref 12.0–15.0)
Hemoglobin: 8.2 g/dL — ABNORMAL LOW (ref 12.0–15.0)
Hemoglobin: 9.2 g/dL — ABNORMAL LOW (ref 12.0–15.0)
O2 Saturation: 100 %
O2 Saturation: 100 %
O2 Saturation: 99 %
O2 Saturation: 100 %
O2 Saturation: 99 %
O2 Saturation: 100 %
Potassium: 3.7 mmol/L (ref 3.5–5.1)
Potassium: 3.8 mmol/L (ref 3.5–5.1)
Potassium: 3.9 mmol/L (ref 3.5–5.1)
Potassium: 3.9 mmol/L (ref 3.5–5.1)
Potassium: 3.9 mmol/L (ref 3.5–5.1)
Potassium: 4.1 mmol/L (ref 3.5–5.1)
Sodium: 138 mmol/L (ref 135–145)
Sodium: 138 mmol/L (ref 135–145)
Sodium: 140 mmol/L (ref 135–145)
Sodium: 140 mmol/L (ref 135–145)
Sodium: 139 mmol/L (ref 135–145)
Sodium: 140 mmol/L (ref 135–145)
TCO2: 25 mmol/L (ref 22–32)
TCO2: 24 mmol/L (ref 22–32)
TCO2: 25 mmol/L (ref 22–32)
TCO2: 23 mmol/L (ref 22–32)
TCO2: 22 mmol/L (ref 22–32)
TCO2: 23 mmol/L (ref 22–32)
pCO2 arterial: 48.7 mm[Hg] — ABNORMAL HIGH (ref 32–48)
pCO2 arterial: 43.4 mm[Hg] (ref 32–48)
pCO2 arterial: 52.2 mm[Hg] — ABNORMAL HIGH (ref 32–48)
pCO2 arterial: 43.8 mm[Hg] (ref 32–48)
pCO2 arterial: 41.4 mm[Hg] (ref 32–48)
pCO2 arterial: 43.5 mm[Hg] (ref 32–48)
pH, Arterial: 7.284 — ABNORMAL LOW (ref 7.35–7.45)
pH, Arterial: 7.327 — ABNORMAL LOW (ref 7.35–7.45)
pH, Arterial: 7.252 — ABNORMAL LOW (ref 7.35–7.45)
pH, Arterial: 7.311 — ABNORMAL LOW (ref 7.35–7.45)
pH, Arterial: 7.317 — ABNORMAL LOW (ref 7.35–7.45)
pH, Arterial: 7.308 — ABNORMAL LOW (ref 7.35–7.45)
pO2, Arterial: 272 mm[Hg] — ABNORMAL HIGH (ref 83–108)
pO2, Arterial: 209 mm[Hg] — ABNORMAL HIGH (ref 83–108)
pO2, Arterial: 179 mm[Hg] — ABNORMAL HIGH (ref 83–108)
pO2, Arterial: 195 mm[Hg] — ABNORMAL HIGH (ref 83–108)
pO2, Arterial: 174 mm[Hg] — ABNORMAL HIGH (ref 83–108)
pO2, Arterial: 185 mm[Hg] — ABNORMAL HIGH (ref 83–108)

## 2023-05-27 LAB — PREPARE RBC (CROSSMATCH)

## 2023-05-27 LAB — POCT ACTIVATED CLOTTING TIME
Activated Clotting Time: 193 s
Activated Clotting Time: 210 s
Activated Clotting Time: 228 s
Activated Clotting Time: 274 s
Activated Clotting Time: 216 s
Activated Clotting Time: 239 s
Activated Clotting Time: 227 s
Activated Clotting Time: 204 s
Activated Clotting Time: 228 s
Activated Clotting Time: 256 s

## 2023-05-27 SURGERY — ENDARTERECTOMY, FEMORAL
Anesthesia: General | Site: Leg Upper | Laterality: Right

## 2023-05-27 MED ORDER — ALBUMIN HUMAN 5 % IV SOLN: INTRAVENOUS | Status: DC | PRN

## 2023-05-27 MED ORDER — POTASSIUM CHLORIDE CRYS ER 20 MEQ PO TBCR: 20.0000 meq | EXTENDED_RELEASE_TABLET | Freq: Every day | ORAL | Status: DC | PRN

## 2023-05-27 MED ORDER — SODIUM CHLORIDE (PF) 0.9 % IJ SOLN: INTRAVENOUS | Status: DC | PRN

## 2023-05-27 MED ORDER — ACETAMINOPHEN 650 MG RE SUPP: 325.0000 mg | RECTAL | Status: DC | PRN

## 2023-05-27 MED ORDER — FENTANYL CITRATE (PF) 250 MCG/5ML IJ SOLN
INTRAMUSCULAR | Status: DC | PRN
  Administered 2023-05-27 (×3): 25 ug via INTRAVENOUS
  Administered 2023-05-27: 100 ug via INTRAVENOUS
  Administered 2023-05-27 (×3): 25 ug via INTRAVENOUS

## 2023-05-27 MED ORDER — CLOPIDOGREL BISULFATE 75 MG PO TABS
75.0000 mg | ORAL_TABLET | Freq: Every day | ORAL | Status: DC
  Administered 2023-05-28 – 2023-05-30 (×3): 75 mg via ORAL
  Filled 2023-05-27 (×3): qty 1

## 2023-05-27 MED ORDER — SODIUM CHLORIDE 0.9 % IV SOLN
INTRAVENOUS | Status: AC
Start: 2023-05-27 — End: 2023-05-28

## 2023-05-27 MED ORDER — ALUM & MAG HYDROXIDE-SIMETH 200-200-20 MG/5ML PO SUSP
15.0000 mL | ORAL | Status: DC | PRN
Start: 2023-05-27 — End: 2023-05-30

## 2023-05-27 MED ORDER — DEXAMETHASONE SODIUM PHOSPHATE 10 MG/ML IJ SOLN
INTRAMUSCULAR | Status: DC | PRN
  Administered 2023-05-27: 4 mg via INTRAVENOUS

## 2023-05-27 MED ORDER — PROPOFOL 10 MG/ML IV BOLUS
INTRAVENOUS | Status: AC
  Filled 2023-05-27: qty 20

## 2023-05-27 MED ORDER — FENTANYL CITRATE (PF) 100 MCG/2ML IJ SOLN
INTRAMUSCULAR | Status: AC
  Filled 2023-05-27: qty 2

## 2023-05-27 MED ORDER — LABETALOL HCL 5 MG/ML IV SOLN: 10.0000 mg | INTRAVENOUS | Status: DC | PRN

## 2023-05-27 MED ORDER — PHENYLEPHRINE 80 MCG/ML (10ML) SYRINGE FOR IV PUSH (FOR BLOOD PRESSURE SUPPORT)
PREFILLED_SYRINGE | INTRAVENOUS | Status: AC
  Filled 2023-05-27: qty 10

## 2023-05-27 MED ORDER — 0.9 % SODIUM CHLORIDE (POUR BTL) OPTIME
TOPICAL | Status: DC | PRN
  Administered 2023-05-27: 1000 mL

## 2023-05-27 MED ORDER — DEXAMETHASONE SODIUM PHOSPHATE 10 MG/ML IJ SOLN
INTRAMUSCULAR | Status: AC
  Filled 2023-05-27: qty 1

## 2023-05-27 MED ORDER — LACTATED RINGERS IV SOLN: INTRAVENOUS | Status: DC | PRN

## 2023-05-27 MED ORDER — PHENYLEPHRINE 80 MCG/ML (10ML) SYRINGE FOR IV PUSH (FOR BLOOD PRESSURE SUPPORT)
PREFILLED_SYRINGE | INTRAVENOUS | Status: DC | PRN
  Administered 2023-05-27 (×2): 40 ug via INTRAVENOUS
  Administered 2023-05-27: 160 ug via INTRAVENOUS
  Administered 2023-05-27: 40 ug via INTRAVENOUS

## 2023-05-27 MED ORDER — MAGNESIUM SULFATE 2 GM/50ML IV SOLN: 2.0000 g | Freq: Every day | INTRAVENOUS | Status: DC | PRN

## 2023-05-27 MED ORDER — LIDOCAINE 2% (20 MG/ML) 5 ML SYRINGE
INTRAMUSCULAR | Status: AC
  Filled 2023-05-27: qty 5

## 2023-05-27 MED ORDER — CEFAZOLIN SODIUM-DEXTROSE 2-4 GM/100ML-% IV SOLN
2.0000 g | INTRAVENOUS | Status: AC
  Administered 2023-05-27 (×2): 2 g via INTRAVENOUS
  Filled 2023-05-27: qty 100

## 2023-05-27 MED ORDER — GUAIFENESIN-DM 100-10 MG/5ML PO SYRP
15.0000 mL | ORAL_SOLUTION | ORAL | Status: DC | PRN
  Administered 2023-05-28 – 2023-05-29 (×4): 15 mL via ORAL
  Filled 2023-05-27 (×4): qty 15

## 2023-05-27 MED ORDER — VANCOMYCIN HCL 750 MG/150ML IV SOLN
750.0000 mg | Freq: Once | INTRAVENOUS | Status: AC
  Administered 2023-05-27: 750 mg via INTRAVENOUS
  Filled 2023-05-27: qty 150

## 2023-05-27 MED ORDER — HEPARIN 6000 UNIT IRRIGATION SOLUTION
Status: DC | PRN
  Administered 2023-05-27: 1

## 2023-05-27 MED ORDER — LOSARTAN POTASSIUM-HCTZ 50-12.5 MG PO TABS: 1.0000 | ORAL_TABLET | Freq: Every day | ORAL | Status: DC

## 2023-05-27 MED ORDER — HEPARIN SODIUM (PORCINE) 1000 UNIT/ML IJ SOLN
INTRAMUSCULAR | Status: DC | PRN
  Administered 2023-05-27 (×3): 2000 [IU] via INTRAVENOUS
  Administered 2023-05-27: 3500 [IU] via INTRAVENOUS
  Administered 2023-05-27: 2000 [IU] via INTRAVENOUS
  Administered 2023-05-27: 5000 [IU] via INTRAVENOUS

## 2023-05-27 MED ORDER — ACETAMINOPHEN 325 MG PO TABS: 325.0000 mg | ORAL_TABLET | ORAL | Status: DC | PRN

## 2023-05-27 MED ORDER — CHLORHEXIDINE GLUCONATE CLOTH 2 % EX PADS: 6.0000 | MEDICATED_PAD | Freq: Once | CUTANEOUS | Status: DC

## 2023-05-27 MED ORDER — ROCURONIUM BROMIDE 10 MG/ML (PF) SYRINGE
PREFILLED_SYRINGE | INTRAVENOUS | Status: DC | PRN
  Administered 2023-05-27: 30 mg via INTRAVENOUS
  Administered 2023-05-27: 50 mg via INTRAVENOUS
  Administered 2023-05-27: 10 mg via INTRAVENOUS
  Administered 2023-05-27: 20 mg via INTRAVENOUS
  Administered 2023-05-27 (×3): 10 mg via INTRAVENOUS

## 2023-05-27 MED ORDER — ALBUTEROL SULFATE (2.5 MG/3ML) 0.083% IN NEBU: 3.0000 mL | INHALATION_SOLUTION | Freq: Four times a day (QID) | RESPIRATORY_TRACT | Status: DC | PRN

## 2023-05-27 MED ORDER — ONDANSETRON HCL 4 MG/2ML IJ SOLN
4.0000 mg | Freq: Four times a day (QID) | INTRAMUSCULAR | Status: DC | PRN
Start: 2023-05-27 — End: 2023-05-30

## 2023-05-27 MED ORDER — HEPARIN SODIUM (PORCINE) 1000 UNIT/ML IJ SOLN
INTRAMUSCULAR | Status: AC
  Filled 2023-05-27: qty 10

## 2023-05-27 MED ORDER — PROPOFOL 10 MG/ML IV BOLUS
INTRAVENOUS | Status: DC | PRN
  Administered 2023-05-27: 50 mg via INTRAVENOUS

## 2023-05-27 MED ORDER — LIDOCAINE 2% (20 MG/ML) 5 ML SYRINGE
INTRAMUSCULAR | Status: DC | PRN
  Administered 2023-05-27: 40 mg via INTRAVENOUS

## 2023-05-27 MED ORDER — OXYCODONE HCL 5 MG PO TABS
10.0000 mg | ORAL_TABLET | ORAL | Status: DC | PRN
  Administered 2023-05-27 – 2023-05-30 (×11): 10 mg via ORAL
  Filled 2023-05-27 (×10): qty 2

## 2023-05-27 MED ORDER — HEPARIN 6000 UNIT IRRIGATION SOLUTION
Status: AC
  Filled 2023-05-27: qty 1000

## 2023-05-27 MED ORDER — PROTAMINE SULFATE 10 MG/ML IV SOLN
INTRAVENOUS | Status: DC | PRN
Start: 2023-05-27 — End: 2023-05-27
  Administered 2023-05-27: 20 mg via INTRAVENOUS
  Administered 2023-05-27: 10 mg via INTRAVENOUS
  Administered 2023-05-27: 20 mg via INTRAVENOUS

## 2023-05-27 MED ORDER — CHLORHEXIDINE GLUCONATE 0.12 % MT SOLN
15.0000 mL | Freq: Once | OROMUCOSAL | Status: AC
  Administered 2023-05-27: 15 mL via OROMUCOSAL
  Filled 2023-05-27: qty 15

## 2023-05-27 MED ORDER — FENTANYL CITRATE (PF) 100 MCG/2ML IJ SOLN
25.0000 ug | INTRAMUSCULAR | Status: AC | PRN
  Administered 2023-05-27 (×6): 25 ug via INTRAVENOUS

## 2023-05-27 MED ORDER — SUGAMMADEX SODIUM 200 MG/2ML IV SOLN
INTRAVENOUS | Status: DC | PRN
  Administered 2023-05-27: 150 mg via INTRAVENOUS

## 2023-05-27 MED ORDER — ASPIRIN 81 MG PO TBEC
81.0000 mg | DELAYED_RELEASE_TABLET | Freq: Every day | ORAL | Status: DC
  Administered 2023-05-27 – 2023-05-30 (×4): 81 mg via ORAL
  Filled 2023-05-27 (×4): qty 1

## 2023-05-27 MED ORDER — EPHEDRINE SULFATE-NACL 50-0.9 MG/10ML-% IV SOSY
PREFILLED_SYRINGE | INTRAVENOUS | Status: DC | PRN
  Administered 2023-05-27: 5 mg via INTRAVENOUS

## 2023-05-27 MED ORDER — FENTANYL CITRATE (PF) 250 MCG/5ML IJ SOLN
INTRAMUSCULAR | Status: AC
  Filled 2023-05-27: qty 5

## 2023-05-27 MED ORDER — PHENYLEPHRINE HCL-NACL 20-0.9 MG/250ML-% IV SOLN
INTRAVENOUS | Status: DC | PRN
  Administered 2023-05-27: 25 ug/min via INTRAVENOUS

## 2023-05-27 MED ORDER — OXYCODONE HCL 5 MG PO TABS
ORAL_TABLET | ORAL | Status: AC
  Filled 2023-05-27: qty 2

## 2023-05-27 MED ORDER — MIRTAZAPINE 15 MG PO TABS
15.0000 mg | ORAL_TABLET | Freq: Every day | ORAL | Status: DC
  Administered 2023-05-27 – 2023-05-29 (×3): 15 mg via ORAL
  Filled 2023-05-27 (×3): qty 1

## 2023-05-27 MED ORDER — ORAL CARE MOUTH RINSE: 15.0000 mL | Freq: Once | OROMUCOSAL | Status: AC

## 2023-05-27 MED ORDER — SODIUM CHLORIDE 0.9% IV SOLUTION: Freq: Once | INTRAVENOUS | Status: DC

## 2023-05-27 MED ORDER — MUPIROCIN 2 % EX OINT
1.0000 | TOPICAL_OINTMENT | Freq: Two times a day (BID) | CUTANEOUS | Status: DC
Start: 2023-05-27 — End: 2023-05-30
  Administered 2023-05-27 – 2023-05-30 (×6): 1 via TOPICAL
  Filled 2023-05-27 (×2): qty 22

## 2023-05-27 MED ORDER — CEFAZOLIN SODIUM-DEXTROSE 2-4 GM/100ML-% IV SOLN
2.0000 g | Freq: Three times a day (TID) | INTRAVENOUS | Status: AC
  Administered 2023-05-27 – 2023-05-28 (×2): 2 g via INTRAVENOUS
  Filled 2023-05-27 (×2): qty 100

## 2023-05-27 MED ORDER — GLYCOPYRROLATE PF 0.2 MG/ML IJ SOSY
PREFILLED_SYRINGE | INTRAMUSCULAR | Status: DC | PRN
  Administered 2023-05-27: .1 mg via INTRAVENOUS

## 2023-05-27 MED ORDER — DOCUSATE SODIUM 100 MG PO CAPS
100.0000 mg | ORAL_CAPSULE | Freq: Every day | ORAL | Status: DC
  Administered 2023-05-28 – 2023-05-30 (×3): 100 mg via ORAL
  Filled 2023-05-27 (×3): qty 1

## 2023-05-27 MED ORDER — HYDROCHLOROTHIAZIDE 12.5 MG PO TABS
12.5000 mg | ORAL_TABLET | Freq: Every day | ORAL | Status: DC
Start: 2023-05-29 — End: 2023-05-29

## 2023-05-27 MED ORDER — ROCURONIUM BROMIDE 10 MG/ML (PF) SYRINGE
PREFILLED_SYRINGE | INTRAVENOUS | Status: AC
  Filled 2023-05-27: qty 10

## 2023-05-27 MED ORDER — IODIXANOL 320 MG/ML IV SOLN
INTRAVENOUS | Status: DC | PRN
  Administered 2023-05-27: 14 mg

## 2023-05-27 MED ORDER — MORPHINE SULFATE (PF) 2 MG/ML IV SOLN
2.0000 mg | INTRAVENOUS | Status: DC | PRN
  Administered 2023-05-28 – 2023-05-30 (×3): 2 mg via INTRAVENOUS
  Filled 2023-05-27 (×3): qty 1

## 2023-05-27 MED ORDER — ONDANSETRON HCL 4 MG/2ML IJ SOLN
INTRAMUSCULAR | Status: AC
  Filled 2023-05-27: qty 2

## 2023-05-27 MED ORDER — LOSARTAN POTASSIUM 50 MG PO TABS: 50.0000 mg | ORAL_TABLET | Freq: Every day | ORAL | Status: DC

## 2023-05-27 MED ORDER — DROPERIDOL 2.5 MG/ML IJ SOLN: 0.6250 mg | Freq: Once | INTRAMUSCULAR | Status: DC | PRN

## 2023-05-27 MED ORDER — HYDRALAZINE HCL 20 MG/ML IJ SOLN: 5.0000 mg | INTRAMUSCULAR | Status: DC | PRN

## 2023-05-27 MED ORDER — SODIUM CHLORIDE 0.9 % IV SOLN: INTRAVENOUS | Status: DC | PRN

## 2023-05-27 MED ORDER — SODIUM CHLORIDE 0.9 % IV SOLN: INTRAVENOUS | Status: DC

## 2023-05-27 MED ORDER — PHENOL 1.4 % MT LIQD: 1.0000 | OROMUCOSAL | Status: DC | PRN

## 2023-05-27 MED ORDER — SURGIFLO WITH THROMBIN (HEMOSTATIC MATRIX KIT) OPTIME
TOPICAL | Status: DC | PRN
  Administered 2023-05-27 (×2): 1 via TOPICAL

## 2023-05-27 MED ORDER — METOPROLOL TARTRATE 5 MG/5ML IV SOLN: 2.0000 mg | INTRAVENOUS | Status: DC | PRN

## 2023-05-27 MED ORDER — ROSUVASTATIN CALCIUM 5 MG PO TABS
5.0000 mg | ORAL_TABLET | Freq: Every day | ORAL | Status: DC
  Administered 2023-05-27: 5 mg via ORAL
  Filled 2023-05-27: qty 1

## 2023-05-27 MED ORDER — SODIUM CHLORIDE 0.9 % IV SOLN: 500.0000 mL | Freq: Once | INTRAVENOUS | Status: DC | PRN

## 2023-05-27 MED ORDER — PANTOPRAZOLE SODIUM 40 MG PO TBEC
40.0000 mg | DELAYED_RELEASE_TABLET | Freq: Every day | ORAL | Status: DC
  Administered 2023-05-27 – 2023-05-30 (×4): 40 mg via ORAL
  Filled 2023-05-27 (×4): qty 1

## 2023-05-27 MED ORDER — HEPARIN SODIUM (PORCINE) 5000 UNIT/ML IJ SOLN
5000.0000 [IU] | Freq: Three times a day (TID) | INTRAMUSCULAR | Status: DC
  Administered 2023-05-28 – 2023-05-30 (×7): 5000 [IU] via SUBCUTANEOUS
  Filled 2023-05-27 (×7): qty 1

## 2023-05-27 SURGICAL SUPPLY — 92 items
BAG COUNTER SPONGE SURGICOUNT (BAG) ×4 IMPLANT
BAG ISOLATION DRAPE 18X18 (DRAPES) IMPLANT
BALLN MUSTANG 9X40X75 (BALLOONS) ×4 IMPLANT
BALLN MUSTANG 9X80X75 (BALLOONS) ×4 IMPLANT
BALLOON MUSTANG 9X40X75 (BALLOONS) IMPLANT
BALLOON MUSTANG 9X80X75 (BALLOONS) IMPLANT
BANDAGE ESMARK 6X9 LF (GAUZE/BANDAGES/DRESSINGS) IMPLANT
BLADE CLIPPER SURG (BLADE) ×4 IMPLANT
BNDG ELASTIC 4X5.8 VLCR STR LF (GAUZE/BANDAGES/DRESSINGS) IMPLANT
BNDG ESMARK 6X9 LF (GAUZE/BANDAGES/DRESSINGS) ×8 IMPLANT
CANISTER SUCT 3000ML PPV (MISCELLANEOUS) ×4 IMPLANT
CANNULA VESSEL 3MM 2 BLNT TIP (CANNULA) IMPLANT
CATH BEACON 5.038 65CM KMP-01 (CATHETERS) ×4 IMPLANT
CATH EMB 4FR 40 (CATHETERS) IMPLANT
CATH EMB 5FR 80CM (CATHETERS) IMPLANT
CATH OMNI FLUSH .035X70CM (CATHETERS) ×4 IMPLANT
CLIP TI MEDIUM 24 (CLIP) ×4 IMPLANT
CLIP TI WIDE RED SMALL 24 (CLIP) ×4 IMPLANT
CLIP TI WIDE RED SMALL 6 (CLIP) IMPLANT
COVER BACK TABLE 80X110 HD (DRAPES) IMPLANT
CUFF TOURN SGL QUICK 18 NS (TOURNIQUET CUFF) IMPLANT
CUFF TOURN SGL QUICK 42 (TOURNIQUET CUFF) IMPLANT
CUFF TRNQT CYL 24X4X16.5-23 (TOURNIQUET CUFF) IMPLANT
CUFF TRNQT CYL 34X4.125X (TOURNIQUET CUFF) IMPLANT
DERMABOND ADVANCED .7 DNX12 (GAUZE/BANDAGES/DRESSINGS) ×4 IMPLANT
DEVICE ENSNARE 12MMX20MM (VASCULAR PRODUCTS) IMPLANT
DEVICE TORQUE H2O (MISCELLANEOUS) IMPLANT
DRAIN CHANNEL 15F RND FF W/TCR (WOUND CARE) IMPLANT
DRAPE INCISE 23X17 STRL (DRAPES) IMPLANT
DRAPE INCISE IOBAN 23X17 STRL (DRAPES) ×4 IMPLANT
ELECT REM PT RETURN 9FT ADLT (ELECTROSURGICAL) ×8 IMPLANT
ELECTRODE REM PT RTRN 9FT ADLT (ELECTROSURGICAL) ×8 IMPLANT
EVACUATOR SILICONE 100CC (DRAIN) IMPLANT
GLOVE BIOGEL PI IND STRL 8 (GLOVE) IMPLANT
GLOVE SURG SS PI 7.5 STRL IVOR (GLOVE) ×12 IMPLANT
GOWN STRL REUS W/ TWL LRG LVL3 (GOWN DISPOSABLE) ×8 IMPLANT
GOWN STRL REUS W/ TWL XL LVL3 (GOWN DISPOSABLE) ×8 IMPLANT
GRAFT BALLN CATH 65CM (BALLOONS) ×4 IMPLANT
GRAFT PROPATEN W/RING 6X80X60 (Vascular Products) IMPLANT
GRAFT SKIN WND MICRO 38 (Tissue) IMPLANT
GUIDEWIRE ANGLED .035X150CM (WIRE) IMPLANT
HEMOSTAT SNOW SURGICEL 2X4 (HEMOSTASIS) IMPLANT
KIT BASIN OR (CUSTOM PROCEDURE TRAY) ×4 IMPLANT
KIT ENCORE 26 ADVANTAGE (KITS) IMPLANT
KIT TURNOVER KIT B (KITS) ×4 IMPLANT
LOOP VESSEL MAXI 1X406 RED (MISCELLANEOUS) IMPLANT
MARKER GRAFT CORONARY BYPASS (MISCELLANEOUS) IMPLANT
NS IRRIG 1000ML POUR BTL (IV SOLUTION) ×8 IMPLANT
PACK PERIPHERAL VASCULAR (CUSTOM PROCEDURE TRAY) ×4 IMPLANT
PAD ARMBOARD 7.5X6 YLW CONV (MISCELLANEOUS) ×8 IMPLANT
PAD CAST 4YDX4 CTTN HI CHSV (CAST SUPPLIES) IMPLANT
PATCH VASC XENOSURE 1X6 (Vascular Products) IMPLANT
PROTECTION STATION PRESSURIZED (MISCELLANEOUS) ×4 IMPLANT
SET COLLECT BLD 21X3/4 12 (NEEDLE) IMPLANT
SET MICROPUNCTURE 5F STIFF (MISCELLANEOUS) ×4 IMPLANT
SET WALTER ACTIVATION W/DRAPE (SET/KITS/TRAYS/PACK) IMPLANT
SHEATH AFX SYS S1745 (SHEATH) IMPLANT
SHEATH BRITE TIP 7FRX11 (SHEATH) IMPLANT
SHEATH BRITE TIP 8FR 23CM (SHEATH) ×4 IMPLANT
SHEATH PINNACLE 8F 10CM (SHEATH) ×4 IMPLANT
SPONGE INTESTINAL PEANUT (DISPOSABLE) ×4 IMPLANT
SPONGE T-LAP 18X18 ~~LOC~~+RFID (SPONGE) IMPLANT
STATION PROTECTION PRESSURIZED (MISCELLANEOUS) IMPLANT
STENT GRAFT AFX 22X40/13X14 (Endovascular Graft) IMPLANT
STOPCOCK 4 WAY LG BORE MALE ST (IV SETS) IMPLANT
STOPCOCK MORSE 400PSI 3WAY (MISCELLANEOUS) ×4 IMPLANT
SURGIFLO W/THROMBIN 8M KIT (HEMOSTASIS) IMPLANT
SUT ETHILON 3 0 PS 1 (SUTURE) IMPLANT
SUT PROLENE 5 0 C 1 24 (SUTURE) ×4 IMPLANT
SUT PROLENE 6 0 BV (SUTURE) ×4 IMPLANT
SUT PROLENE 7 0 BV 1 (SUTURE) IMPLANT
SUT SILK 2 0 SH (SUTURE) ×4 IMPLANT
SUT SILK 2-0 18XBRD TIE 12 (SUTURE) IMPLANT
SUT SILK 3-0 18XBRD TIE 12 (SUTURE) IMPLANT
SUT VIC AB 2-0 CT1 TAPERPNT 27 (SUTURE) ×8 IMPLANT
SUT VIC AB 3-0 SH 27X BRD (SUTURE) ×8 IMPLANT
SUT VIC AB 3-0 X1 27 (SUTURE) ×4 IMPLANT
SUT VIC AB 4-0 PS2 18 (SUTURE) IMPLANT
SUT VICRYL 4-0 PS2 18IN ABS (SUTURE) IMPLANT
SYR 10ML LL (SYRINGE) IMPLANT
SYR 20ML LL LF (SYRINGE) ×4 IMPLANT
SYR 3ML LL SCALE MARK (SYRINGE) IMPLANT
TAPE UMBILICAL 1/8X30 (MISCELLANEOUS) IMPLANT
TOWEL GREEN STERILE (TOWEL DISPOSABLE) ×4 IMPLANT
TRAY FOLEY MTR SLVR 16FR STAT (SET/KITS/TRAYS/PACK) ×4 IMPLANT
TUBING EXTENTION W/L.L. (IV SETS) IMPLANT
TUBING HIGH PRESSURE 120CM (CONNECTOR) ×4 IMPLANT
UNDERPAD 30X36 HEAVY ABSORB (UNDERPADS AND DIAPERS) ×4 IMPLANT
WATER STERILE IRR 1000ML POUR (IV SOLUTION) ×4 IMPLANT
WIRE AMPLATZ SS-J .035X180CM (WIRE) ×8 IMPLANT
WIRE AMPLATZ SSTIFF .035X260CM (WIRE) IMPLANT
WIRE BENTSON .035X145CM (WIRE) ×8 IMPLANT

## 2023-05-27 NOTE — H&P (Signed)
   Patient name: Lauren King MRN: [id number] DOB: 1950/10/16 Sex: female    HISTORY OF PRESENT ILLNESS:   Lauren King is a 73 y.o. female with rest pain who is here for revascularization  CURRENT MEDICATIONS:    Current Facility-Administered Medications  Medication Dose Route Frequency Provider Last Rate Last Admin   0.9 %  sodium chloride infusion   Intravenous Continuous Nada Libman, MD       ceFAZolin (ANCEF) IVPB 2g/100 mL premix  2 g Intravenous 30 min Pre-Op Nada Libman, MD       Chlorhexidine Gluconate Cloth 2 % PADS 6 each  6 each Topical Once Nada Libman, MD       And   Chlorhexidine Gluconate Cloth 2 % PADS 6 each  6 each Topical Once Nada Libman, MD       vancomycin (VANCOCIN) IVPB 1000 mg/200 mL premix  1,000 mg Intravenous Once Nada Libman, MD       Facility-Administered Medications Ordered in Other Encounters  Medication Dose Route Frequency Provider Last Rate Last Admin   lactated ringers infusion   Intravenous Continuous PRN Garfield Cornea, CRNA   New Bag at 05/27/23 [phone]    REVIEW OF SYSTEMS:   [X]  denotes positive finding, [ ]  denotes negative finding Cardiac  Comments:  Chest pain or chest pressure:    Shortness of breath upon exertion:    Short of breath when lying flat:    Irregular heart rhythm:    Constitutional    Fever or chills:      PHYSICAL EXAM:   Vitals:   05/27/23 0558  BP: (!) 156/76  Pulse: 84  Resp: 18  Temp: 97.9 F (36.6 C)  TempSrc: Oral  SpO2: 93%  Weight: 34.9 kg  Height: 5\' 1"  (1.549 m)    GENERAL: The patient is a well-nourished female, in no acute distress. The vital signs are documented above. CARDIOVASCULAR: There is a regular rate and rhythm. PULMONARY: Non-labored respirations   STUDIES:      MEDICAL ISSUES:   Right leg rest pain.  Discussed complexity of the surgery and that without it, she is at risk for amputation,  She wants to  proceed   Charlena Cross, MD, FACS [facility] [phone] Pager [phone]

## 2023-05-27 NOTE — Op Note (Signed)
Patient name: Lauren King MRN: [id number] DOB: 1950/07/21 Sex: female  05/27/2023 Pre-operative Diagnosis: Right leg rest pain Post-operative diagnosis:  Same Surgeon:  [name] Assistants:  Aggie Moats, PA Procedure:   #1: Endovascular aortobiiliac stent (CPT [phone]) using Endologix AFX 22 40-13-40   #2: Right femoral endarterectomy with bovine pericardial patch angioplasty   #3: Right iliofemoral and profundofemoral thrombectomy   #4: Left femoral exposure with left femoral endarterectomy and bovine pericardial patch angioplasty   #5: Angioplasty, left and right external iliac artery   #6: Right femoral to below-knee popliteal artery bypass graft with ipsilateral translocated nonreversed saphenous vein   #7: Placement of Kerecis skin substitute to bilateral femoral incisions Anesthesia:  General Blood Loss:  400 cc Specimens:  none  Findings: Open exposure of bilateral femoral arteries was performed.  This was a redo in the left groin.  There was subacute thrombus in the right external iliac, common femoral and profundofemoral artery which was completely occlusive.  Thrombectomy was performed, followed by femoral endarterectomy and patch angioplasty.  I then s with a bovine pericardial patch tented the aorta iliac system with a Endologix AFX device.  Next bilateral external iliac arteries were angioplastied with a 9 mm balloon.  Femoral endarterectomy and patch angioplasty with bovine pericardial patch was performed on the left femoral artery.  I then performed a right femoral to below-knee popliteal bypass graft with ipsilateral saphenous vein.  The vein distended to about 3 mm.  The below-knee popliteal artery was about a 2 mm artery.  Indications: This is a 73 year old female who has undergone multiple prior interventions for limb salvage.  She has developed rest pain in her right leg.  Angiography revealed ostial bilateral iliac stenosis above her previously placed VBX stents.   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  She is having rest pain in the right leg.  She comes in today for intervention.  Procedure:  The patient was identified in the holding area and taken to [facility] OR ROOM 16  The patient was then placed supine on the table. general anesthesia was administered.  The patient was prepped and draped in the usual sterile fashion.  A time out was called and antibiotics were administered.  A PA was necessary to expedite the procedure and assist with technical details.  He help with exposure by providing suction and retraction.  He help with wire exchanges and stent deployment.  He help with the vein harvest and all of the anastomoses by following the suture.  He help with wound closure.  I initially began by making an oblique incision in the right groin.  Cautery was used to divide the subcutaneous tissue down the femoral sheath which was opened sharply.  I exposed the femoral artery from the inguinal ligament down to the bifurcation.  I then dissected out the profundofemoral artery to his primary branch.  Next, I opened the previous longitudinal left femoral incision with a 10 blade.  I dissected out the left femoral artery.  There was a fair amount of scar tissue.  The femoral artery was exposed the inguinal ligament down to the bifurcation.  The profundofemoral artery was exposed down to his primary bifurcation.  The patient was fully heparinized.  Next, a micropuncture needle was inserted into the right femoral artery.  I did not get any blood return.  I then elected to use a #11 blade to open the  femoral artery.  There was no flow within the artery.  I extended the arteriotomy up in the proximal common femoral artery making sure to avoid the previously placed femoral stent.  Common femoral artery had subacute thrombus.  I then opened the artery down onto the profundofemoral artery where there  was thrombus in its proximal portion.  Going out onto the profunda for about a centimeter I was able to get backbleeding.  The profunda was occluded with a baby Gregory clamp.  I then used a #4 Fogarty balloon and passed this up into the external iliac artery and performed thrombectomy where a large amount of subacute thrombus was evacuated.  2 passes were performed and adequate inflow was obtained.  I then inflated a Fogarty balloon in the right extrailiac artery for proximal control.  A Madelaine Etienne elevator was used to perform endarterectomy removing the posterior plaque.  I got a good distal endpoint in the profundofemoral artery.  A bovine pericardial patch was selected and patch angioplasty was performed with a running 5-0 Prolene.  Once this was completed blood flow was reestablished to the right leg.  I then cannulated the patch with a micropuncture needle.  A 018 wire was inserted followed by micropuncture sheath.  Over a Bentson wire an 8 [place] sheath was placed.  I then cannulated the left femoral artery with a micropuncture needle and advanced a wire into the aorta.  A micropuncture sheath was placed and ultimately exchanged out for an 8 [place] sheath.  Using a Berenstein 2 catheter, a stiff wire was advanced up the right side.  I then placed a dilator followed by the 17 [place] Endologix sheath.  The stent was then inserted.  This was a Endologix 22 40-13-40.  Next, a snare was advanced up the sheath in the left groin into the aorta.  I snared the wire and brought it out the sheath.  The device was then on sheath into the aorta.  It was partially deployed and then pulled down and seated appropriately on the aortic bifurcation and then fully deployed.  I advanced 9 mm Mustang balloons up each side and molded the graft into the aorta with kissing balloons and then the common iliac graft was molded with 9 mm balloon.  I then performed balloon angioplasty of the previously placed external iliac stents  bilaterally with a 9 mm balloon.  A completion arteriogram was then performed which showed resolution of the stenosis within the distal aorta and proximal common iliac arteries.  The external iliac arteries were widely patent.  The left femoral sheath was removed and the arteriotomy closed with a 5-0 Prolene.  Also on the right.  Attention was turned towards the left groin.  I opened the artery with an 11 blade and Potts scissors.  I used a Fogarty balloon for proximal control and the baby Gregory clamp on the profundofemoral artery.  A Madelaine Etienne elevator was used to perform endarterectomy of the common femoral and profundofemoral artery.  Bovine pericardial patch angioplasty was then performed with running 5-0 Prolene.  Prior to completion the appropriate flushing maneuvers were performed and the anastomosis was completed.  There were excellent signals in the common femoral and profundofemoral artery.  Attention was then turned towards the right leg.  I made a medial below-knee incision.  I identified the saphenous vein within this incision and fully mobilized it, ligating side branches between silk ties.  I then took down the fascia and entered the popliteal space and  identified the below-knee popliteal artery which was a soft artery but small measuring about 2 to 3 mm.  I then continued to expose the saphenous vein through skip incisions throughout the lower leg.  Once the vein was fully exposed it was ligated distally with a 2-0 silk tie.  The saphenofemoral junction was oversewn with 2 layers of 5-0 Prolene.  Next, a subsartorial tunnel was created between groin incision and the below-knee incision.  The vein was spared on the back table.  It distended to about 5 mm.  Next, I opened the previous bovine patch and inserted a Fogarty balloon proximally for proximal control so as not to disrupt the external iliac stent.  A  baby Earl Lites clamp was placed on the profundofemoral artery.  The vein was placed in  nonreversed fashion and spatulated to fit the size the arteriotomy.  A running anastomosis was then created with 6-0 Prolene.  Once this was completed the clamps were released.  I then used a valvulotome to lyse the valves.  3 passes were made.  There was excellent flow through the graft which was marked for orientation.  The graft was then sutured to the tunneler and brought through the tunnel making sure to maintain proper orientation.  A tourniquet was placed on the upper thigh and the leg was exsanguinated with an Esmarch.  The fracture was then inflated.  I then used an 11 blade to open the below-knee popliteal artery which was extended with Potts scissors.  The leg was straightened and the vein was cut the appropriate length.  The vein was spatulated to fit the size the arteriotomy.  A running anastomosis was then created with 6-0 Prolene.  Prior to completion, the tourniquet was let down and the anastomosis was completed after appropriate flushing.  There was a graft dependent peroneal Doppler signal which was the only runoff.  I was satisfied with these results.  50 mg of protamine was administered.  The wounds were irrigated and hemostasis was achieved.  The below-knee incision was closed by reapproximating the fascia with 2-0 Vicryl.  The subcutaneous tissue was then closed with an additional layer of Vicryl and a subcuticular closure.  The vein har.  Vest incisions were closed with 2 layers of Vicryl.  I then closed the femoral sheath bilaterally with 2-0 Vicryl.  There was minimal subcutaneous tissue.  I was worried about graft coverage and wound healing and so I placed Kerecis powder into each groin incision and then closed the subcutaneous tissue over top of this followed by subcuticular closure.  Dermabond was placed on the incisions.  The patient tolerated the procedure well.  There were no immediate complications.   Disposition: To PACU stable   V. [name], M.D., [facility] [place] Office: [phone] Pager:  [phone]

## 2023-05-27 NOTE — Anesthesia Procedure Notes (Signed)
Procedure Name: Intubation Date/Time: 05/27/2023 7:58 AM  Performed by: Garfield Cornea, CRNAPre-anesthesia Checklist: Patient identified, Emergency Drugs available, Suction available and Patient being monitored Patient Re-evaluated:Patient Re-evaluated prior to induction Oxygen Delivery Method: Circle System Utilized Preoxygenation: Pre-oxygenation with 100% oxygen Induction Type: IV induction Ventilation: Mask ventilation without difficulty and Oral airway inserted - appropriate to patient size Laryngoscope Size: Mac and 3 Grade View: Grade I Tube type: Oral Tube size: 7.0 mm Number of attempts: 1 Airway Equipment and Method: Stylet and Oral airway Placement Confirmation: ETT inserted through vocal cords under direct vision, positive ETCO2 and breath sounds checked- equal and bilateral Secured at: 22 cm Tube secured with: Tape Dental Injury: Teeth and Oropharynx as per pre-operative assessment

## 2023-05-27 NOTE — Progress Notes (Signed)
Dr. Myra Gianotti made aware that patient did not take Aspirin 81 mg this morning. Ok per Dr. Myra Gianotti.

## 2023-05-27 NOTE — Anesthesia Procedure Notes (Signed)
Arterial Line Insertion Start/End1/17/2025 7:00 AM Performed by: Garfield Cornea, CRNA  Patient location: Pre-op. Preanesthetic checklist: patient identified, IV checked, site marked, risks and benefits discussed, surgical consent, monitors and equipment checked, pre-op evaluation, timeout performed and anesthesia consent Lidocaine 1% used for infiltration Right, radial was placed Catheter size: 20 G Hand hygiene performed  and maximum sterile barriers used   Attempts: 2 Procedure performed without using ultrasound guided technique. Following insertion, dressing applied and Biopatch. Post procedure assessment: normal and unchanged  Patient tolerated the procedure well with no immediate complications.

## 2023-05-27 NOTE — Transfer of Care (Signed)
Immediate Anesthesia Transfer of Care Note  Patient: Lauren King  Procedure(s) Performed: RIGHT FEMORAL ENDARTERECTOMY WITH XENSURE BOVINE PERICARDIUM PATCH AND REDO LEFT FEMORAL ENDARTERECTOMY WITH XENSURE BOVINE PERICARDIUM PATCH (Bilateral: Groin) BYPASS GRAFT RIGHT FEMORAL TO BELOW THE KNEE POPITEAL ARTERY USING NONREVERSED RIGHT GREATER SAPHENOUS VEIN (Right: Leg Upper) AORTIC STENTING USING ENDOLOGIX GRAFT APPLICATION OF SKIN SUBSTITUTE USING KERECIS SURICAL FISH SKIN GRAFT TO BILATERAL GROIN (Bilateral: Groin)  Patient Location: PACU  Anesthesia Type:General  Level of Consciousness: awake, drowsy, patient cooperative, and responds to stimulation  Airway & Oxygen Therapy: Patient Spontanous Breathing and Patient connected to face mask oxygen  Post-op Assessment: Report given to RN and Post -op Vital signs reviewed and stable  Post vital signs: Reviewed and stable  Last Vitals:  Vitals Value Taken Time  BP 143/85 05/27/23 1530  Temp    Pulse 108 05/27/23 1534  Resp 26 05/27/23 1534  SpO2 93 % 05/27/23 1534  Vitals shown include unfiled device data.  Last Pain:  Vitals:   05/27/23 0618  TempSrc:   PainSc: 8       Patients Stated Pain Goal: 0 (05/27/23 0618)  Complications: No notable events documented.

## 2023-05-28 LAB — CBC
HCT: 22.9 % — ABNORMAL LOW (ref 36.0–46.0)
Hemoglobin: 7.6 g/dL — ABNORMAL LOW (ref 12.0–15.0)
MCH: 29.2 pg (ref 26.0–34.0)
MCHC: 33.2 g/dL (ref 30.0–36.0)
MCV: 88.1 fL (ref 80.0–100.0)
Platelets: 187 10*3/uL (ref 150–400)
RBC: 2.6 MIL/uL — ABNORMAL LOW (ref 3.87–5.11)
RDW: 15.8 % — ABNORMAL HIGH (ref 11.5–15.5)
WBC: 8.2 10*3/uL (ref 4.0–10.5)
nRBC: 0 % (ref 0.0–0.2)

## 2023-05-28 LAB — BASIC METABOLIC PANEL
Anion gap: 6 (ref 5–15)
BUN: 10 mg/dL (ref 8–23)
CO2: 22 mmol/L (ref 22–32)
Calcium: 7.8 mg/dL — ABNORMAL LOW (ref 8.9–10.3)
Chloride: 105 mmol/L (ref 98–111)
Creatinine, Ser: 0.56 mg/dL (ref 0.44–1.00)
GFR, Estimated: >60 mL/min (ref >60)
Glucose, Bld: 118 mg/dL — ABNORMAL HIGH (ref 70–99)
Potassium: 4 mmol/L (ref 3.5–5.1)
Sodium: 133 mmol/L — ABNORMAL LOW (ref 135–145)

## 2023-05-28 LAB — PREPARE RBC (CROSSMATCH)

## 2023-05-28 LAB — LIPID PANEL
Cholesterol: 55 mg/dL (ref 0–200)
HDL: 27 mg/dL — ABNORMAL LOW (ref >40)
LDL Cholesterol: 16 mg/dL (ref 0–99)
Total CHOL/HDL Ratio: 2 {ratio}
Triglycerides: 61 mg/dL (ref <150)
VLDL: 12 mg/dL (ref 0–40)

## 2023-05-28 MED ORDER — ROSUVASTATIN CALCIUM 20 MG PO TABS
20.0000 mg | ORAL_TABLET | Freq: Every day | ORAL | Status: DC
  Administered 2023-05-28 – 2023-05-30 (×3): 20 mg via ORAL
  Filled 2023-05-28 (×3): qty 1

## 2023-05-28 MED ORDER — SODIUM CHLORIDE 0.9% IV SOLUTION: Freq: Once | INTRAVENOUS | Status: AC

## 2023-05-28 NOTE — Evaluation (Signed)
Occupational Therapy Evaluation Patient Details Name: Lauren King MRN: [id number] DOB: July 07, 1950 Today's Date: 05/28/2023   History of Present Illness Pt is a 73 y/o female with pain in R LE at rest admitted 1/17 for revascularization, s/p multiple areas with angioplasty, stenting and endarterectomies plus R femoral to below knee popliteal art. BPGing.  PMHx:  COPD, HTN, PVD, OSA, anxiety/depression.   Clinical Impression   At baseline, pt is Independent to Mod I with ADLs, most IADLs, and functional mobility/transfers without an AD. At baseline, pt receives assistance from family for IADLs as needed. Pt now presents with decreased activity tolerance, pain affecting functional level, decreased balance, and decreased safety and independence with functional tasks. Pt currently demonstrates ability to complete UB ADLs Independent to Contact guard assist, LB ADLs with Set up to Max assist, and functional step-pivot transfers with a RW with Min assist. Pt participated well in session and is motivated to return to PLOF. OT expects pt to make excellent progress toward goals once pain is better controled. Pt will benefit from acute skilled OT services to address deficits outlined below and increase safety and independence with functional tasks. Pt will also benefit from use of BSC/3-in-1 in the home for energy conservation. Post acute discharge, no post-acute skilled OT needs are anticipated at this time.       If plan is discharge home, recommend the following: A little help with walking and/or transfers;A little help with bathing/dressing/bathroom;Assistance with cooking/housework;Assist for transportation;Help with stairs or ramp for entrance    Functional Status Assessment  Patient has had a recent decline in their functional status and demonstrates the ability to make significant improvements in function in a reasonable and predictable amount of time.  Equipment Recommendations  BSC/3in1     Recommendations for Other Services       Precautions / Restrictions Precautions Precautions: Fall Restrictions Weight Bearing Restrictions Per Provider Order: No      Mobility Bed Mobility Overal bed mobility: Needs Assistance Bed Mobility: Supine to Sit, Sit to Supine     Supine to sit: Mod assist Sit to supine: Mod assist   General bed mobility comments: Mod at R LE, minimal for trunk assist    Transfers Overall transfer level: Needs assistance Equipment used: Rolling walker (2 wheels) Transfers: Sit to/from Stand, Bed to chair/wheelchair/BSC Sit to Stand: Min assist     Step pivot transfers: Min assist     General transfer comment: cues for hand placement ascent/descent      Balance Overall balance assessment: Needs assistance Sitting-balance support: No upper extremity supported, Feet supported Sitting balance-Leahy Scale: Good     Standing balance support: Single extremity supported, Bilateral upper extremity supported, During functional activity Standing balance-Leahy Scale: Poor Standing balance comment: reliant on AD to maintain balance in standing                           ADL either performed or assessed with clinical judgement   ADL Overall ADL's : Needs assistance/impaired Eating/Feeding: Independent;Sitting   Grooming: Set up;Sitting   Upper Body Bathing: Set up;Sitting   Lower Body Bathing: Maximal assistance;Cueing for compensatory techniques;Sitting/lateral leans Lower Body Bathing Details (indicate cue type and reason): Max to wash/dry feet; otherwise Supervision/Set up in sitting/lateral lean Upper Body Dressing : Contact guard assist;Sitting Upper Body Dressing Details (indicate cue type and reason): assist for managing lines Lower Body Dressing: Maximal assistance;Sitting/lateral leans;Sit to/from stand Lower Body Dressing Details (indicate cue  type and reason): Pt requiring Max assist to donn/doff socks and thread feet into  pants due to pain and Min assist once pants at level of calves Toilet Transfer: Minimal assistance;BSC/3in1;Rolling walker (2 wheels) (step-pivot transfer)   Toileting- Clothing Manipulation and Hygiene: Set up;Sitting/lateral lean;Minimal assistance;Sit to/from stand Toileting - Clothing Manipulation Details (indicate cue type and reason): Set up for toileting hygeine in sitting and Min assist for clothing management in sit/stand       General ADL Comments: Pt functional level limited by decreased activity tolerance and R LE pain this session.     Vision Baseline Vision/History: 1 Wears glasses (readers) Ability to See in Adequate Light: 0 Adequate Patient Visual Report: No change from baseline       Perception         Praxis         Pertinent Vitals/Pain Pain Assessment Pain Assessment: Faces Faces Pain Scale: Hurts whole lot Pain Location: R leg/knee/incisions Pain Descriptors / Indicators: Burning, Discomfort, Grimacing, Guarding Pain Intervention(s): Limited activity within patient's tolerance, Monitored during session, Repositioned, Patient requesting pain meds-RN notified, RN gave pain meds during session     Extremity/Trunk Assessment Upper Extremity Assessment Upper Extremity Assessment: Right hand dominant;Overall WFL for tasks assessed (gross B UE strength 4/5)   Lower Extremity Assessment Lower Extremity Assessment: Defer to PT evaluation   Cervical / Trunk Assessment Cervical / Trunk Assessment: Normal   Communication Communication Communication: No apparent difficulties Cueing Techniques: Verbal cues   Cognition Arousal: Alert Behavior During Therapy: WFL for tasks assessed/performed Overall Cognitive Status: Within Functional Limits for tasks assessed                                 General Comments: AAOx4 and pleasant throughout session. Demonstrates good safty anwareness and good insight into deficits with tasks assessed.      General Comments  VSS on RA throughout session. RN and NT present during a portion of session.    Exercises     Shoulder Instructions      Home Living Family/patient expects to be discharged to:: Private residence Living Arrangements: Children;Other relatives (daughter and 41 yo granddaughter) Available Help at Discharge: Family;Available 24 hours/day;Available PRN/intermittently Type of Home: Apartment Home Access: Elevator;Stairs to enter Entrance Stairs-Number of Steps: flight (stairs are close to apt, elevator is a far walk) Entrance Stairs-Rails: Right;Left Home Layout: One level     Bathroom Shower/Tub: Chief Strategy Officer: Handicapped height Bathroom Accessibility: Yes How Accessible: Accessible via walker Home Equipment: Rolling Walker (2 wheels);Grab bars - tub/shower;Wheelchair - manual (Was given a bari w/c for some reason.)          Prior Functioning/Environment Prior Level of Function : Independent/Modified Independent             Mobility Comments: Independent without an AD ADLs Comments: Independent with ADLs and IADLs; has a driver's license but typically doesn't drive; family assist with transportation and IADLs PRN        OT Problem List: Decreased activity tolerance;Impaired balance (sitting and/or standing);Pain      OT Treatment/Interventions: Self-care/ADL training;Energy conservation;DME and/or AE instruction;Therapeutic activities;Balance training;Patient/family education    OT Goals(Current goals can be found in the care plan section) Acute Rehab OT Goals Patient Stated Goal: to have less pain and return home OT Goal Formulation: With patient Time For Goal Achievement: 06/11/23 Potential to Achieve Goals: Good ADL Goals Pt Will  Perform Grooming: with supervision;standing Pt Will Perform Lower Body Bathing: with contact guard assist;sit to/from stand;sitting/lateral leans Pt Will Perform Lower Body Dressing: with contact  guard assist;sit to/from stand;sitting/lateral leans Pt Will Transfer to Toilet: with supervision;ambulating;bedside commode (with least restrictive AD) Pt Will Perform Toileting - Clothing Manipulation and hygiene: with supervision;sit to/from stand Additional ADL Goal #1: Patient will demonstrate ability to Independently state 3 energy conservation strategies to increase safety and independence with functional tasks.  OT Frequency: Min 1X/week    Co-evaluation              AM-PAC OT "6 Clicks" Daily Activity     Outcome Measure Help from another person eating meals?: None Help from another person taking care of personal grooming?: A Little Help from another person toileting, which includes using toliet, bedpan, or urinal?: A Little Help from another person bathing (including washing, rinsing, drying)?: A Lot Help from another person to put on and taking off regular upper body clothing?: A Little Help from another person to put on and taking off regular lower body clothing?: A Lot 6 Click Score: 17   End of Session Equipment Utilized During Treatment: Gait belt;Rolling walker (2 wheels) Nurse Communication: Mobility status;Other (comment);Patient requests pain meds (Pt requests cough medication)  Activity Tolerance: Patient tolerated treatment well;Patient limited by pain Patient left: in bed;with call bell/phone within reach;with bed alarm set  OT Visit Diagnosis: Pain;Other (comment);Unsteadiness on feet (R26.81) (decreased activity tolerance)                Time: 1610-9604 OT Time Calculation (min): 26 min Charges:  OT General Charges $OT Visit: 1 Visit OT Evaluation $OT Eval Low Complexity: 1 Low  Aaryn Parrilla "Kyle" M., OTR/L, MA Acute Rehab [phone]  Lendon Colonel 05/28/2023, 6:08 PM

## 2023-05-28 NOTE — Progress Notes (Signed)
PHARMACIST LIPID MONITORING   Lauren King is a 73 y.o. female admitted on 05/27/2023 with pain in R LE admitted for revascularization.  Pharmacy has been consulted to optimize lipid-lowering therapy with the indication of secondary prevention for clinical ASCVD.  Recent Labs:  Lipid Panel (last 6 months):   Lab Results  Component Value Date   CHOL 55 05/28/2023   TRIG 61 05/28/2023   HDL 27 (L) 05/28/2023   CHOLHDL 2.0 05/28/2023   VLDL 12 05/28/2023   LDLCALC 16 05/28/2023    Hepatic function panel (last 6 months):   Lab Results  Component Value Date   AST 31 05/19/2023   ALT 27 05/19/2023   ALKPHOS 88 05/19/2023   BILITOT 0.5 05/19/2023    SCr (since admission):   Serum creatinine: 0.56 mg/dL 47/42/59 5638 Estimated creatinine clearance: 34 mL/min  Current therapy and lipid therapy tolerance Current lipid-lowering therapy: rosuvastatin 5mg  Previous lipid-lowering therapies (if applicable): N/A Documented or reported allergies or intolerances to lipid-lowering therapies (if applicable): N/A  Assessment:   Patient is a 73 year old female currently taking rosuvastatin 5mg  once daily at bed time for lipid lowering therapy. She notes that she receives this from [facility] mail order and has no problems obtaining or affording the medication. She is amenable to increasing her dose to 20mg  once daily to reach high intensity statin therapy.   Plan:    1.Statin intensity (high intensity recommended for all patients regardless of the LDL):  Add or increase statin to high intensity., increase to rosuvastatin 20mg  once daily   2.Add ezetimibe (if any one of the following):   Not indicated at this time.  3.Refer to lipid clinic:   No  4.Follow-up with:  Primary care provider - Aris Everts, MD  5.Follow-up labs after discharge:  Since LDL at goal, no need to remeasure - current LDL at 16.    Blane Ohara, PharmD, BCPS PGY2 Pharmacy Resident

## 2023-05-28 NOTE — Progress Notes (Signed)
Admitted from PACU accompanied by RN,alert and oriented. V/S checked attached to cardiac monitoring and CCMD notified.Will continue to monitor .

## 2023-05-28 NOTE — Evaluation (Signed)
Physical Therapy Evaluation Patient Details Name: Lauren King MRN: [id number] DOB: 1950-11-21 Today's Date: 05/28/2023  History of Present Illness  Pt is a 73 y/o female with pain in R LE at rest admitted 1/17 for revascularization, s/p multiple areas with angioplasty, stenting and endarterectomies plus R femoral to below knee popliteal art. BPGing.  PMHx:  COPD, HTN, PVD, OSA, anxiety/depression.  Clinical Impression  Pt admitted with/for revascularization to ultimately decrease rest pain R LE.  Presently, pt needing mod assist for bed mobility and CG assist for transfers and gait.  Pt currently limited functionally due to the problems listed below.  (see problems list.)  Pt will benefit from PT to maximize function and safety to be able to get home safely with available assist .         If plan is discharge home, recommend the following: A little help with walking and/or transfers;A little help with bathing/dressing/bathroom;Assistance with cooking/housework;Assist for transportation;Help with stairs or ramp for entrance   Can travel by private vehicle        Equipment Recommendations Other (comment) (needs a more appropriate sized w/c--cushion\)  Recommendations for Other Services       Functional Status Assessment Patient has had a recent decline in their functional status and demonstrates the ability to make significant improvements in function in a reasonable and predictable amount of time.     Precautions / Restrictions Precautions Precautions: Fall      Mobility  Bed Mobility Overal bed mobility: Needs Assistance Bed Mobility: Supine to Sit, Sit to Supine     Supine to sit: Mod assist Sit to supine: Mod assist   General bed mobility comments: Mod at R LE, minimal for trunk assist    Transfers Overall transfer level: Needs assistance   Transfers: Sit to/from Stand Sit to Stand: Min assist           General transfer comment: cues for hand placement  ascent/descent    Ambulation/Gait Ambulation/Gait assistance: Contact guard assist Gait Distance (Feet): 35 Feet Assistive device: Rolling walker (2 wheels) Gait Pattern/deviations: Step-to pattern, Decreased stride length, Decreased step length - right, Decreased step length - left   Gait velocity interpretation: <1.31 ft/sec, indicative of household ambulator   General Gait Details: antalgic step to pattern,  mildly unsteady, but safe without hands on assist  Stairs            Wheelchair Mobility     Tilt Bed    Modified Rankin (Stroke Patients Only)       Balance Overall balance assessment: Needs assistance Sitting-balance support: No upper extremity supported, Feet supported Sitting balance-Leahy Scale: Good     Standing balance support: Single extremity supported, Bilateral upper extremity supported, During functional activity Standing balance-Leahy Scale: Poor Standing balance comment: reliant on AD and UE's.                             Pertinent Vitals/Pain Pain Assessment Pain Assessment: Faces Faces Pain Scale: Hurts whole lot Pain Location: R leg/knee/incisions Pain Descriptors / Indicators: Burning, Discomfort, Grimacing, Guarding Pain Intervention(s): Limited activity within patient's tolerance, Monitored during session, Premedicated before session    Home Living Family/patient expects to be discharged to:: Private residence Living Arrangements: Children;Other relatives (dtr and grand dtr will be with her.) Available Help at Discharge: Family;Available 24 hours/day;Available PRN/intermittently Type of Home: Apartment Home Access: Elevator;Stairs to enter Entrance Stairs-Rails: Right;Left Entrance Stairs-Number of Steps: flight (stairs are  close to apt, elevator is a far walk)   Home Layout: One level Home Equipment: Agricultural consultant (2 wheels);Grab bars - tub/shower (Was given a bari w/c for some reason.)      Prior Function Prior  Level of Function : Independent/Modified Independent                     Extremity/Trunk Assessment   Upper Extremity Assessment Upper Extremity Assessment: Defer to OT evaluation    Lower Extremity Assessment Lower Extremity Assessment: RLE deficits/detail;LLE deficits/detail RLE Deficits / Details: painful and resultant weakness, needing assist to move to EOB RLE Coordination: decreased fine motor LLE Deficits / Details: WFL    Cervical / Trunk Assessment Cervical / Trunk Assessment: Normal  Communication   Communication Communication: No apparent difficulties Cueing Techniques: Verbal cues  Cognition Arousal: Alert Behavior During Therapy: WFL for tasks assessed/performed Overall Cognitive Status: Within Functional Limits for tasks assessed (NT formally)                                          General Comments General comments (skin integrity, edema, etc.): vss with lower Hgb    Exercises     Assessment/Plan    PT Assessment Patient needs continued PT services  PT Problem List Decreased activity tolerance;Decreased mobility;Decreased coordination;Decreased knowledge of use of DME;Decreased skin integrity       PT Treatment Interventions DME instruction;Gait training;Stair training;Functional mobility training;Therapeutic activities;Patient/family education    PT Goals (Current goals can be found in the Care Plan section)  Acute Rehab PT Goals Patient Stated Goal: independent at home,  less leg pain PT Goal Formulation: With patient Time For Goal Achievement: 06/04/23 Potential to Achieve Goals: Good    Frequency Min 1X/week     Co-evaluation               AM-PAC PT "6 Clicks" Mobility  Outcome Measure Help needed turning from your back to your side while in a flat bed without using bedrails?: A Lot Help needed moving from lying on your back to sitting on the side of a flat bed without using bedrails?: A Lot Help needed  moving to and from a bed to a chair (including a wheelchair)?: A Little Help needed standing up from a chair using your arms (e.g., wheelchair or bedside chair)?: A Little Help needed to walk in hospital room?: A Little Help needed climbing 3-5 steps with a railing? : A Lot 6 Click Score: 15    End of Session   Activity Tolerance: Patient tolerated treatment well;Patient limited by pain Patient left: in bed;with call bell/phone within reach;with nursing/sitter in room;with family/visitor present Nurse Communication: Mobility status PT Visit Diagnosis: Other abnormalities of gait and mobility (R26.89);Difficulty in walking, not elsewhere classified (R26.2);Pain Pain - Right/Left: Right Pain - part of body: Leg;Knee    Time: 4098-1191 PT Time Calculation (min) (ACUTE ONLY): 24 min   Charges:   PT Evaluation $PT Eval Moderate Complexity: 1 Mod PT Treatments $Gait Training: 8-22 mins PT General Charges $$ ACUTE PT VISIT: 1 Visit         05/28/2023  Jacinto Halim., PT Acute Rehabilitation Services [phone]  (office)  Eliseo Gum Hearl Heikes 05/28/2023, 12:11 PM

## 2023-05-28 NOTE — Progress Notes (Addendum)
  Progress Note    05/28/2023 8:16 AM 1 Day Post-Op  Subjective:  says she still hurts  Afebrile HR 90's-110's  90's-120's systolic 96% RA  Vitals:   05/28/23 0600 05/28/23 0800  BP: (!) 127/58 123/60  Pulse: 100 93  Resp: 20 19  Temp:  98 F (36.7 C)  SpO2: 94% 95%    Physical Exam: General:  no distress Cardiac:  regular Lungs:  non labored Incisions:  bilateral groin incisions look good as well as right lower leg Extremities:  + DP and PT doppler flow bilaterally   CBC    Component Value Date/Time   WBC 8.2 05/28/2023 0500   RBC 2.60 (L) 05/28/2023 0500   HGB 7.6 (L) 05/28/2023 0500   HGB 12.3 08/28/2012 0000   HCT 22.9 (L) 05/28/2023 0500   HCT 36 08/28/2012 0000   PLT 187 05/28/2023 0500   MCV 88.1 05/28/2023 0500   MCV 92.4 08/28/2012 0000   MCH 29.2 05/28/2023 0500   MCHC 33.2 05/28/2023 0500   RDW 15.8 (H) 05/28/2023 0500   LYMPHSABS 3.2 01/03/2022 0905   MONOABS 0.9 01/03/2022 0905   EOSABS 0.3 01/03/2022 0905   BASOSABS 0.1 01/03/2022 0905    BMET    Component Value Date/Time   NA 133 (L) 05/28/2023 0500   K 4.0 05/28/2023 0500   CL 105 05/28/2023 0500   CO2 22 05/28/2023 0500   GLUCOSE 118 (H) 05/28/2023 0500   BUN 10 05/28/2023 0500   CREATININE 0.56 05/28/2023 0500   CREATININE 0.62 08/28/2012 0000   CALCIUM 7.8 (L) 05/28/2023 0500   CALCIUM 8.5 08/28/2012 0000   GFRNONAA >60 05/28/2023 0500   GFRAA >60 12/23/2018 0332    INR    Component Value Date/Time   INR 1.0 05/19/2023 1353     Intake/Output Summary (Last 24 hours) at 05/28/2023 0816 Last data filed at 05/28/2023 2202 Gross per 24 hour  Intake 3515 ml  Output 3500 ml  Net 15 ml      Assessment/Plan:  73 y.o. female is s/p:  EVAR, bilateral femoral endarterectomy,  right iliofemoral and profundofemoral thrombectomy, angioplasty bilateral EIA and right femoral to BK bypass with ipsilateral non reversed GSV for right leg pain 1 Day Post-Op   -pt with + doppler  flow bilateral feet and incisions look fine -acute surgical blood loss anemia-will transfuse one unit -check labs in am -OOB to chair today -DVT prophylaxis:  sq heparin   Doreatha Massed, PA-C [facility] [phone] 05/28/2023 8:16 AM  I have seen and evaluated the patient. I agree with the PA note as documented above.  Postop day 1 status post Endologix aortic graft with bilateral femoral endarterectomies and right femoropopliteal bypass.  All of her incisions look good.  Brisk Doppler signals in both feet.  Will give 1 unit PRBCs for hemoglobin of 7.6.  Cephus Shelling, MD [facility] [place] Office: [phone]

## 2023-05-29 LAB — BASIC METABOLIC PANEL
Anion gap: 7 (ref 5–15)
BUN: 6 mg/dL — ABNORMAL LOW (ref 8–23)
CO2: 22 mmol/L (ref 22–32)
Calcium: 7.9 mg/dL — ABNORMAL LOW (ref 8.9–10.3)
Chloride: 100 mmol/L (ref 98–111)
Creatinine, Ser: 0.55 mg/dL (ref 0.44–1.00)
GFR, Estimated: >60 mL/min (ref >60)
Glucose, Bld: 103 mg/dL — ABNORMAL HIGH (ref 70–99)
Potassium: 3.5 mmol/L (ref 3.5–5.1)
Sodium: 129 mmol/L — ABNORMAL LOW (ref 135–145)

## 2023-05-29 LAB — CBC
HCT: 27.2 % — ABNORMAL LOW (ref 36.0–46.0)
Hemoglobin: 9.3 g/dL — ABNORMAL LOW (ref 12.0–15.0)
MCH: 29.8 pg (ref 26.0–34.0)
MCHC: 34.2 g/dL (ref 30.0–36.0)
MCV: 87.2 fL (ref 80.0–100.0)
Platelets: 190 10*3/uL (ref 150–400)
RBC: 3.12 MIL/uL — ABNORMAL LOW (ref 3.87–5.11)
RDW: 15.5 % (ref 11.5–15.5)
WBC: 13.2 10*3/uL — ABNORMAL HIGH (ref 4.0–10.5)
nRBC: 0 % (ref 0.0–0.2)

## 2023-05-29 MED ORDER — SODIUM CHLORIDE 0.9 % IV SOLN: INTRAVENOUS | Status: DC

## 2023-05-29 NOTE — Plan of Care (Signed)

## 2023-05-29 NOTE — Progress Notes (Addendum)
  Progress Note    05/29/2023 6:40 AM 2 Days Post-Op  Subjective:  says she's sore.  She did get out of bed yesterday.  Walked to the door.  Denies any nausea/vomiting or overall feeling bad.  Afebrile HR 80's-100 NSR 100's-120's systolic 93% RA  Vitals:   05/29/23 0006 05/29/23 0335  BP: 117/64 121/62  Pulse: 92 (!) 102  Resp: 16 19  Temp: 97.8 F (36.6 C) 97.8 F (36.6 C)  SpO2: 93% 93%    Physical Exam: General:  no distress Cardiac:  regular Lungs:  non labored Incisions:  bilateral groins and right leg incisions all look good Extremities:  brisk doppler flow bilateral feet  CBC    Component Value Date/Time   WBC 13.2 (H) 05/29/2023 0312   RBC 3.12 (L) 05/29/2023 0312   HGB 9.3 (L) 05/29/2023 0312   HGB 12.3 08/28/2012 0000   HCT 27.2 (L) 05/29/2023 0312   HCT 36 08/28/2012 0000   PLT 190 05/29/2023 0312   MCV 87.2 05/29/2023 0312   MCV 92.4 08/28/2012 0000   MCH 29.8 05/29/2023 0312   MCHC 34.2 05/29/2023 0312   RDW 15.5 05/29/2023 0312   LYMPHSABS 3.2 01/03/2022 0905   MONOABS 0.9 01/03/2022 0905   EOSABS 0.3 01/03/2022 0905   BASOSABS 0.1 01/03/2022 0905    BMET    Component Value Date/Time   NA 129 (L) 05/29/2023 0312   K 3.5 05/29/2023 0312   CL 100 05/29/2023 0312   CO2 22 05/29/2023 0312   GLUCOSE 103 (H) 05/29/2023 0312   BUN 6 (L) 05/29/2023 0312   CREATININE 0.55 05/29/2023 0312   CREATININE 0.62 08/28/2012 0000   CALCIUM 7.9 (L) 05/29/2023 0312   CALCIUM 8.5 08/28/2012 0000   GFRNONAA >60 05/29/2023 0312   GFRAA >60 12/23/2018 0332    INR    Component Value Date/Time   INR 1.0 05/19/2023 1353     Intake/Output Summary (Last 24 hours) at 05/29/2023 0640 Last data filed at 05/29/2023 0450 Gross per 24 hour  Intake 809.75 ml  Output 250 ml  Net 559.75 ml      Assessment/Plan:  73 y.o. female is s/p:  EVAR, bilateral femoral endarterectomy, right iliofemoral and profundofemoral thrombectomy, angioplasty bilateral EIA and  right femoral to BK bypass with ipsilateral non reversed GSV for right leg pain   2 Days Post-Op   -pt doing well with + doppler flow bilateral feet and incisions look good -hyponatremia on labs today.  She is asymptomatic. will dc ARB/diuretic at this time.  Repeat labs in am.  -continue mobilizing out of bed -acute surgical blood loss anemia-received total 2 units PRBC and hgb now 9.3-up from 7.6 yesterday. -PT/OT - no follow up recommended -DVT prophylaxis:  sq heparin -continue asa/statin/plavix   Doreatha Massed, PA-C [facility] [phone] 05/29/2023 6:40 AM  I have seen and evaluated the patient. I agree with the PA note as documented above.  Brisk Doppler signals in both feet.  All of her incisions including bilateral femoral incisions look good.  Did respond to 1 unit PRBCs yesterday with hemoglobin now 9.3.  PT OT etc.  Aspirin Plavix statin.  Cephus Shelling, MD [facility] [place] Office: [phone]

## 2023-05-30 ENCOUNTER — Encounter (HOSPITAL_COMMUNITY): Payer: Self-pay | Admitting: Surgery

## 2023-05-30 LAB — TYPE AND SCREEN
ABO/RH(D): O NEG
Antibody Screen: NEGATIVE
Unit division: 0
Unit division: 0

## 2023-05-30 LAB — BPAM RBC
Blood Product Expiration Date: 202501222359
Blood Product Expiration Date: 202502142359
ISSUE DATE / TIME: 202501181212
ISSUE DATE / TIME: 202501171318
Unit Type and Rh: 9500
Unit Type and Rh: 9500

## 2023-05-30 LAB — CBC
HCT: 27.3 % — ABNORMAL LOW (ref 36.0–46.0)
Hemoglobin: 9.2 g/dL — ABNORMAL LOW (ref 12.0–15.0)
MCH: 29.7 pg (ref 26.0–34.0)
MCHC: 33.7 g/dL (ref 30.0–36.0)
MCV: 88.1 fL (ref 80.0–100.0)
Platelets: 209 10*3/uL (ref 150–400)
RBC: 3.1 MIL/uL — ABNORMAL LOW (ref 3.87–5.11)
RDW: 15 % (ref 11.5–15.5)
WBC: 11 10*3/uL — ABNORMAL HIGH (ref 4.0–10.5)
nRBC: 0 % (ref 0.0–0.2)

## 2023-05-30 LAB — BASIC METABOLIC PANEL
Anion gap: 7 (ref 5–15)
BUN: 6 mg/dL — ABNORMAL LOW (ref 8–23)
CO2: 23 mmol/L (ref 22–32)
Calcium: 7.8 mg/dL — ABNORMAL LOW (ref 8.9–10.3)
Chloride: 103 mmol/L (ref 98–111)
Creatinine, Ser: 0.51 mg/dL (ref 0.44–1.00)
GFR, Estimated: >60 mL/min (ref >60)
Glucose, Bld: 112 mg/dL — ABNORMAL HIGH (ref 70–99)
Potassium: 4.1 mmol/L (ref 3.5–5.1)
Sodium: 133 mmol/L — ABNORMAL LOW (ref 135–145)

## 2023-05-30 MED ORDER — HYDROCHLOROTHIAZIDE 12.5 MG PO TABS
12.5000 mg | ORAL_TABLET | Freq: Every day | ORAL | Status: DC
Start: 2023-05-30 — End: 2023-05-30
  Administered 2023-05-30: 12.5 mg via ORAL
  Filled 2023-05-30: qty 1

## 2023-05-30 MED ORDER — LOSARTAN POTASSIUM-HCTZ 50-12.5 MG PO TABS: 1.0000 | ORAL_TABLET | Freq: Every day | ORAL | Status: DC

## 2023-05-30 MED ORDER — LOSARTAN POTASSIUM 50 MG PO TABS
50.0000 mg | ORAL_TABLET | Freq: Every day | ORAL | Status: DC
  Administered 2023-05-30: 50 mg via ORAL
  Filled 2023-05-30: qty 1

## 2023-05-30 NOTE — Progress Notes (Addendum)
  Progress Note    05/30/2023 6:35 AM 3 Days Post-Op  Subjective:  says she feels better.  Has walked.  Ready to go home.  Says she has help at home.  She is getting feeling back in her right foot and it feels better.  Says she needs a smaller wheelchair  Afebrile HR 80's-90's NSR 110's-130's systolic 94% RA  Vitals:   05/30/23 0000 05/30/23 0442  BP: 117/62 127/72  Pulse: 95 84  Resp: 20 16  Temp: 98.8 F (37.1 C) 98.3 F (36.8 C)  SpO2: 96% 97%    Physical Exam: General:  no distress Cardiac:  regular Lungs:  non labored Incisions:  bilateral groins and left leg incisions look good Extremities:  brisk doppler flow right DP/PT and left PT   CBC    Component Value Date/Time   WBC 11.0 (H) 05/30/2023 0323   RBC 3.10 (L) 05/30/2023 0323   HGB 9.2 (L) 05/30/2023 0323   HGB 12.3 08/28/2012 0000   HCT 27.3 (L) 05/30/2023 0323   HCT 36 08/28/2012 0000   PLT 209 05/30/2023 0323   MCV 88.1 05/30/2023 0323   MCV 92.4 08/28/2012 0000   MCH 29.7 05/30/2023 0323   MCHC 33.7 05/30/2023 0323   RDW 15.0 05/30/2023 0323   LYMPHSABS 3.2 01/03/2022 0905   MONOABS 0.9 01/03/2022 0905   EOSABS 0.3 01/03/2022 0905   BASOSABS 0.1 01/03/2022 0905    BMET    Component Value Date/Time   NA 133 (L) 05/30/2023 0323   K 4.1 05/30/2023 0323   CL 103 05/30/2023 0323   CO2 23 05/30/2023 0323   GLUCOSE 112 (H) 05/30/2023 0323   BUN 6 (L) 05/30/2023 0323   CREATININE 0.51 05/30/2023 0323   CREATININE 0.62 08/28/2012 0000   CALCIUM 7.8 (L) 05/30/2023 0323   CALCIUM 8.5 08/28/2012 0000   GFRNONAA >60 05/30/2023 0323   GFRAA >60 12/23/2018 0332    INR    Component Value Date/Time   INR 1.0 05/19/2023 1353     Intake/Output Summary (Last 24 hours) at 05/30/2023 1191 Last data filed at 05/30/2023 4782 Gross per 24 hour  Intake 780 ml  Output 1200 ml  Net -420 ml      Assessment/Plan:  73 y.o. female is s/p:  EVAR, bilateral femoral endarterectomy, right iliofemoral  and profundofemoral thrombectomy, angioplasty bilateral EIA and right femoral to BK bypass with ipsilateral non reversed GSV for right leg pain   3 Days Post-Op   -pt continues to have brisk doppler flow bilateral feet and incisions look good.  -hyponatremia improved with saline infusion -restart ARB/HCTZ -acute surgical blood loss anemia is stable.   -DVT prophylaxis:  sq heparin -continue asa/statin/plavix -possibly home later today -states she needs smaller wheelchair-will ask TOC for assistance. -discussed importance of smoking cessation -PDMP reviewed-no narcotics at discharge   Doreatha Massed, PA-C [facility] [phone] 05/30/2023 6:35 AM

## 2023-05-30 NOTE — Discharge Instructions (Signed)

## 2023-05-30 NOTE — Discharge Summary (Signed)
Discharge Summary     Lauren King 24-Sep-1950 73 y.o. female  [id number]  Admission Date: 05/27/2023  Discharge Date: 05/31/2023  Physician: No att. providers found  Admission Diagnosis: Critical limb ischemia of right lower extremity (HCC) [I70.221] Aortoiliac occlusive disease (HCC) [I74.09]  HPI:   This is a 73 y.o. female who presents for surveillance of PAD and carotid artery stenosis.  Since last office visit 6 months ago she denies any strokelike symptoms including slurring speech, changes in vision, or one-sided weakness.  As of July 2020 bilateral internal carotid arteries demonstrated 1 to 39% stenosis by duplex.   She is also complaining of rest pain in her right foot over the past 3 months.  This occurs nightly.  She also has a subtle numbness feeling in her right foot which is slowly worsening.  She has an extensive surgical history with our office.  Most recently she underwent mechanical thrombectomy of the right external iliac, common femoral, and SFA with stenting of the right external iliac and SFA due to rest pain by Dr. Myra Gianotti in November 2023.  SFA stents however are known to be occluded.  Her left leg bypass is also known to be occluded however she is without rest pain or tissue loss in the left foot currently.  She denies any tissue loss in the right foot.  She is a former smoker.  She takes an aspirin, Plavix, statin daily.  The remainder of her surgical history is as follows:   12/27/2017: Abdominal aortogram 11/21/2018: Abdominal aortogram 12/22/2018: Left femoral to tibioperoneal trunk bypass with saphenous vein (rest pain) 09/11/2019: Angioplasty left tibioperoneal trunk and peroneal artery, stent left external iliac artery rest pain 10/23/2019: Mechanical thrombectomy right external iliac and common femoral artery with stenting of the right common femoral, external iliac, and common iliac artery (rest pain) 02/02/2022: Angioplasty, left popliteal and peroneal  artery, angioplasty, right common femoral artery (claudication) 03/23/2022: Mechanical thrombectomy of the right external iliac, common femoral, and superficial femoral artery, stent right superficial femoral, right external iliac, stent, left external iliac, stent left common iliac (rest pain)  Hospital Course:  The patient was admitted to the hospital and taken to the operating room on 05/27/2023 and underwent:  #1: Endovascular aortobiiliac stent (CPT [phone]) using Endologix AFX 22 40-13-40 #2: Right femoral endarterectomy with bovine pericardial patch angioplasty #3: Right iliofemoral and profundofemoral thrombectomy #4: Left femoral exposure with left femoral endarterectomy and bovine pericardial patch angioplasty #5: Angioplasty, left and right external iliac artery #6: Right femoral to below-knee popliteal artery bypass graft with ipsilateral translocated nonreversed saphenous vein #7: Placement of Kerecis skin substitute to bilateral femoral incisions    Findings: Open exposure of bilateral femoral arteries was performed. This was a redo in the left groin. There was subacute thrombus in the right external iliac, common femoral and profundofemoral artery which was completely occlusive. Thrombectomy was performed, followed by femoral endarterectomy and patch angioplasty. I then s with a bovine pericardial patch tented the aorta iliac system with a Endologix AFX device. Next bilateral external iliac arteries were angioplastied with a 9 mm balloon. Femoral endarterectomy and patch angioplasty with bovine pericardial patch was performed on the left femoral artery. I then performed a right femoral to below-knee popliteal bypass graft with ipsilateral saphenous vein. The vein distended to about 3 mm. The below-knee popliteal artery was about a 2 mm artery.   The pt tolerated the procedure well and was transported to the PACU in good condition.   By  POD 1, All of her incisions look good.  Brisk  Doppler signals in both feet.  Will give 1 unit PRBCs for hemoglobin of 7.6.   POD 2, Brisk Doppler signals in both feet. All of her incisions including bilateral femoral incisions look good. Did respond to 1 unit PRBCs yesterday with hemoglobin now 9.3. PT OT etc. Aspirin Plavix statin.  She did have hyponatremia and was started on IVF and this did improve.  POD 3, pt doing well.  Her right foot pain was improved.  Her incisions healing well.  TOC consulted for new WC.   She is discharged home.   CBC    Component Value Date/Time   WBC 11.0 (H) 05/30/2023 0323   RBC 3.10 (L) 05/30/2023 0323   HGB 9.2 (L) 05/30/2023 0323   HGB 12.3 08/28/2012 0000   HCT 27.3 (L) 05/30/2023 0323   HCT 36 08/28/2012 0000   PLT 209 05/30/2023 0323   MCV 88.1 05/30/2023 0323   MCV 92.4 08/28/2012 0000   MCH 29.7 05/30/2023 0323   MCHC 33.7 05/30/2023 0323   RDW 15.0 05/30/2023 0323   LYMPHSABS 3.2 01/03/2022 0905   MONOABS 0.9 01/03/2022 0905   EOSABS 0.3 01/03/2022 0905   BASOSABS 0.1 01/03/2022 0905    BMET    Component Value Date/Time   NA 133 (L) 05/30/2023 0323   K 4.1 05/30/2023 0323   CL 103 05/30/2023 0323   CO2 23 05/30/2023 0323   GLUCOSE 112 (H) 05/30/2023 0323   BUN 6 (L) 05/30/2023 0323   CREATININE 0.51 05/30/2023 0323   CREATININE 0.62 08/28/2012 0000   CALCIUM 7.8 (L) 05/30/2023 0323   CALCIUM 8.5 08/28/2012 0000   GFRNONAA >60 05/30/2023 0323   GFRAA >60 12/23/2018 0332     Discharge Instructions     Discharge patient   Complete by: As directed    Discharge disposition: 01-Home or Self Care   Discharge patient date: 05/30/2023       Discharge Diagnosis:  Critical limb ischemia of right lower extremity (HCC) [I70.221] Aortoiliac occlusive disease (HCC) [I74.09]  Secondary Diagnosis: Patient Active Problem List   Diagnosis Date Noted   Critical limb ischemia of right lower extremity (HCC) 05/27/2023   Aortoiliac occlusive disease (HCC) 05/27/2023   COPD  exacerbation (HCC) 01/05/2022   Delusions (HCC) 01/03/2022   Hyponatremia 01/03/2022   Urinary tract infection in elderly patient 01/03/2022   PAD (peripheral artery disease) (HCC) 12/22/2018   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/11/2018   Major depressive disorder, single episode 03/22/2017   Diverticulosis of colon without hemorrhage    Reflux esophagitis    Schatzki's ring    Gastric ulceration    Hiatal hernia    Encounter for screening colonoscopy 02/06/2015   Constipation 01/02/2015   Esophageal dysphagia 09/21/2012   GERD (gastroesophageal reflux disease) 09/21/2012   Loss of weight 09/21/2012   Past Medical History:  Diagnosis Date   Anxiety    Chronic lower back pain    COPD (chronic obstructive pulmonary disease) (HCC)    Depression    GERD (gastroesophageal reflux disease)    Hypertension    Obstructive sleep apnea    CPAP   Peripheral vascular disease (HCC)      Allergies as of 05/30/2023   No Known Allergies      Medication List     STOP taking these medications    mupirocin ointment 2 % Commonly known as: [employer]  TAKE these medications    albuterol 108 (90 Base) MCG/ACT inhaler Commonly known as: VENTOLIN HFA Inhale 1-2 puffs into the lungs every 6 (six) hours as needed for wheezing or shortness of breath.   aspirin EC 81 MG tablet Take 81 mg by mouth daily.   clopidogrel 75 MG tablet Commonly known as: PLAVIX Take 1 tablet (75 mg total) by mouth daily.   losartan-hydrochlorothiazide 50-12.5 MG tablet Commonly known as: HYZAAR Take 1 tablet by mouth daily.   mirtazapine 15 MG tablet Commonly known as: REMERON Take 15 mg by mouth at bedtime. What changed: Another medication with the same name was removed. Continue taking this medication, and follow the directions you see here.   Oxycodone HCl 10 MG Tabs Take 10 mg by mouth every 4 (four) hours as needed for severe pain.   pregabalin 25 MG  capsule Commonly known as: Lyrica Take 1 capsule (25 mg total) by mouth 2 (two) times daily.   rosuvastatin 5 MG tablet Commonly known as: Crestor Take 1 tablet (5 mg total) by mouth daily. What changed: when to take this   Trelegy Ellipta 100-62.5-25 MCG/INH Aepb Generic drug: Fluticasone-Umeclidin-Vilant Inhale 1 puff into the lungs daily as needed (Shortness of breath).        Discharge Instructions: [facility] Discharge instructions Lower Extremity Bypass Surgery  Please refer to the following instruction for your post-procedure care. Your surgeon or physician assistant will discuss any changes with you.  Activity  You are encouraged to walk as much as you can. You can slowly return to normal activities during the month after your surgery. Avoid strenuous activity and heavy lifting until your doctor tells you it's OK. Avoid activities such as vacuuming or swinging a golf club. Do not drive until your doctor give the OK and you are no longer taking prescription pain medications. It is also normal to have difficulty with sleep habits, eating and bowel movement after surgery. These will go away with time.  Bathing/Showering  You may shower after you go home. Do not soak in a bathtub, hot tub, or swim until the incision heals completely.  Incision Care  Clean your incision with mild soap and water. Shower every day. Pat the area dry with a clean towel. You do not need a bandage unless otherwise instructed. Do not apply any ointments or creams to your incision. If you have open wounds you will be instructed how to care for them or a visiting nurse may be arranged for you. If you have staples or sutures along your incision they will be removed at your post-op appointment. You may have skin glue on your incision. Do not peel it off. It will come off on its own in about one week.  Wash the groin wound with soap and water daily and pat dry. (No tub  bath-only shower)  Then put a dry gauze or washcloth in the groin to keep this area dry to help prevent wound infection.  Do this daily and as needed.  Do not use Vaseline or neosporin on your incisions.  Only use soap and water on your incisions and then protect and keep dry.  Diet  Resume your normal diet. There are no special food restrictions following this procedure. A low fat/ low cholesterol diet is recommended for all patients with vascular disease. In order to heal from your surgery, it is CRITICAL to get adequate nutrition. Your body requires vitamins, minerals, and protein. Vegetables are the  best source of vitamins and minerals. Vegetables also provide the perfect balance of protein. Processed food has little nutritional value, so try to avoid this.  Medications  Resume taking all your medications unless your doctor or Physician Assistant tells you not to. If your incision is causing pain, you may take over-the-counter pain relievers such as acetaminophen (Tylenol). If you were prescribed a stronger pain medication, please aware these medication can cause nausea and constipation. Prevent nausea by taking the medication with a snack or meal. Avoid constipation by drinking plenty of fluids and eating foods with high amount of fiber, such as fruits, vegetables, and grains. Take Colace 100 mg (an over-the-counter stool softener) twice a day as needed for constipation.  Do not take Tylenol if you are taking prescription pain medications.  Follow Up  Our office will schedule a follow up appointment 2-3 weeks following discharge.  Please call us immediately for any of the following conditions  Severe or worsening pain in your legs or feet while at rest or while walking Increase pain, redness, warmth, or drainage (pus) from your incision site(s) Fever of 101 degree or higher The swelling in your leg with the bypass suddenly worsens and becomes more painful than when you were in the  hospital If you have been instructed to feel your graft pulse then you should do so every day. If you can no longer feel this pulse, call the office immediately. Not all patients are given this instruction.  Leg swelling is common after leg bypass surgery.  The swelling should improve over a few months following surgery. To improve the swelling, you may elevate your legs above the level of your heart while you are sitting or resting. Your surgeon or physician assistant may ask you to apply an ACE wrap or wear compression (TED) stockings to help to reduce swelling.  Reduce your risk of vascular disease  Stop smoking. If you would like help call [facility] at 1-800-QUIT-NOW ((918)842-9472) or [place] at 386-705-9278.  Manage your cholesterol Maintain a desired weight Control your diabetes weight Control your diabetes Keep your blood pressure down  If you have any questions, please call the office at [phone]   Prescriptions given: PDMP reviewed-no narcotics given at discharge  Disposition: home  Patient's condition: is Good  Follow up: 1. Dr. Myra Gianotti in 4 weeks with CTA   Doreatha Massed, PA-C [facility] [phone] 05/31/2023  9:00 AM  - For VQI Registry use ---   Post-op:  Wound infection: No  Graft infection: No  Transfusion: Yes    If yes, 2 units given New Arrhythmia: No Ipsilateral amputation: No, [ ]  Minor, [ ]  BKA, [ ]  AKA Discharge patency: [x ] Primary, [ ]  Primary assisted, [ ]  Secondary, [ ]  Occluded Patency judged by: [x ] Dopper only, [ ]  Palpable graft pulse, []  Palpable distal pulse, [ ]  ABI inc. > 0.15, [ ]  Duplex Discharge ABI: R not done, L  D/C Ambulatory Status: Ambulatory  Complications: MI: No, [ ]  Troponin only, [ ]  EKG or Clinical CHF: No Resp failure:No, [ ]  Pneumonia, [ ]  Ventilator Chg in renal function: No, [ ]  Inc. Cr > 0.5, [ ]  Temp. Dialysis,  [ ]  Permanent dialysis Stroke: No, [ ]  Minor, [ ]   Major Return to OR: No  Reason for return to OR: [ ]  Bleeding, [ ]  Infection, [ ]  Thrombosis, [ ]  Revision  Discharge medications: Statin use:  yes ASA use:  yes Plavix use:  yes Beta blocker use: no CCB use:  No ACEI use:   no ARB use:  yes Coumadin use: no

## 2023-05-30 NOTE — Progress Notes (Signed)
Physical Therapy Treatment Patient Details Name: Lauren King MRN: [id number] DOB: June 16, 1950 Today's Date: 05/30/2023   History of Present Illness Pt is a 73 y/o female with pain in R LE at rest admitted 1/17 for revascularization, s/p multiple areas with angioplasty, stenting and endarterectomies plus R femoral to below knee popliteal art. BPGing.  PMHx:  COPD, HTN, PVD, OSA, anxiety/depression.    PT Comments  Patient able to mobilize with S to CGA into hallway with RW.  She reports pain improved and already has wheelchair ordered to assist with home entry (to access elevator).  States daughter able to assist and has gotten a rollator for when out.  Patient expecting RW for home as well.  Will follow up with RNCM.     If plan is discharge home, recommend the following: A little help with walking and/or transfers;A little help with bathing/dressing/bathroom;Assistance with cooking/housework;Assist for transportation;Help with stairs or ramp for entrance   Can travel by private vehicle        Equipment Recommendations  None recommended by PT    Recommendations for Other Services       Precautions / Restrictions Precautions Precautions: Fall     Mobility  Bed Mobility Overal bed mobility: Modified Independent             General bed mobility comments: slow with moving R leg but able to get up unaided, admitted MD found her going to bathroom on her own this am    Transfers Overall transfer level: Needs assistance Equipment used: Rolling walker (2 wheels) Transfers: Sit to/from Stand Sit to Stand: Supervision           General transfer comment: no physical help, initially minimal weight on R foot    Ambulation/Gait Ambulation/Gait assistance: Supervision, Contact guard assist Gait Distance (Feet): 65 Feet Assistive device: Rolling walker (2 wheels) Gait Pattern/deviations: Step-through pattern, Decreased stride length, Antalgic       General Gait Details:  slow pace, though moving fluidly into hallway; progressing increased weight bearing on R with cues   Stairs             Wheelchair Mobility     Tilt Bed    Modified Rankin (Stroke Patients Only)       Balance Overall balance assessment: Needs assistance   Sitting balance-Leahy Scale: Good     Standing balance support: Bilateral upper extremity supported Standing balance-Leahy Scale: Poor                              Cognition Arousal: Alert Behavior During Therapy: WFL for tasks assessed/performed Overall Cognitive Status: Within Functional Limits for tasks assessed                                          Exercises      General Comments General comments (skin integrity, edema, etc.): Discussed shower chair for home, feels her daughters mother in law may have one they can use; reinforced not to submerge incisions in water. ok for shower.  Noted order for wheelchair and pt states plans initially to use the elevator for accessing apartment, further to get to elevator but can access with wheelchair, agreeable to practice stairs, but felt best to save energy as planned to d/c home today.  Patient without questions/concerns, reports her daughter is a Lawyer and  they buried her daughter's spouse last week which is why she is eager to get home.      Pertinent Vitals/Pain Pain Assessment Pain Assessment: Faces Faces Pain Scale: Hurts little more Pain Location: R leg/knee/incisions Pain Descriptors / Indicators: Tender, Sore Pain Intervention(s): Monitored during session, Premedicated before session    Home Living                          Prior Function            PT Goals (current goals can now be found in the care plan section) Progress towards PT goals: Progressing toward goals    Frequency    Min 1X/week      PT Plan      Co-evaluation              AM-PAC PT "6 Clicks" Mobility   Outcome Measure  Help  needed turning from your back to your side while in a flat bed without using bedrails?: None Help needed moving from lying on your back to sitting on the side of a flat bed without using bedrails?: None Help needed moving to and from a bed to a chair (including a wheelchair)?: A Little Help needed standing up from a chair using your arms (e.g., wheelchair or bedside chair)?: A Little Help needed to walk in hospital room?: A Little Help needed climbing 3-5 steps with a railing? : A Little 6 Click Score: 20    End of Session   Activity Tolerance: Patient tolerated treatment well Patient left: in bed;with call bell/phone within reach (seated on EOB)   PT Visit Diagnosis: Other abnormalities of gait and mobility (R26.89);Difficulty in walking, not elsewhere classified (R26.2);Pain Pain - Right/Left: Right Pain - part of body: Leg;Knee     Time: 1235-1250 PT Time Calculation (min) (ACUTE ONLY): 15 min  Charges:    $Gait Training: 8-22 mins PT General Charges $$ ACUTE PT VISIT: 1 Visit                     Sheran Lawless, PT Acute Rehabilitation Services Office:[phone] 05/30/2023    [name] 05/30/2023, 1:33 PM

## 2023-05-30 NOTE — TOC CM/SW Note (Signed)
   Durable Medical Equipment (From admission, onward)        Start     Ordered  05/30/23 1153  For home use only DME lightweight manual wheelchair with seat cushion  Once      Comments: Patient suffers from COPD, PVD which impairs their ability to perform daily activities like bathing, dressing, and toileting in the home.  A walker will not resolve  issue with performing activities of daily living. A wheelchair will allow patient to safely perform daily activities. Patient is not able to propel themselves in the home using a standard weight wheelchair due to endurance and general weakness. Patient can self propel in the lightweight wheelchair. Length of need Lifetime. Accessories: elevating leg rests (ELRs), wheel locks, extensions and anti-tippers. Back cushion  05/30/23 1153

## 2023-05-30 NOTE — TOC Transition Note (Signed)
Transition of Care [facility]) - Discharge Note Donn Pierini RN, BSN Transitions of Care Unit 4E- RN Case Manager See Treatment Team for direct phone #   Patient Details  Name: Lauren King MRN: [id number] Date of Birth: 1951/04/09  [facility] CM/SW Contact:  Darrold Span, RN Phone Number: 05/30/2023, 2:19 PM   Clinical Narrative:    Pt for likely d/c home this afternoon, referral received regarding pt needing smaller wheelchair for home.   CM in to speak with pt at bedside. Per pt she has a larger wheelchair at home that she "inherited" and it is too large for her- she states she would like a smaller one more her size. Will request order from attending service. Pt voiced her daughter will transport home.   No HH needs noted per therapy notes.   Call made to [employer] as pt has [facility] and they contract all DME needs with [employer]- liaison to process order and see if they have the correct size pt needs as she is 5'1" and 77 pounds. If correct size in stock they will deliver to bedside prior to discharge, if they have to order then w/c will be shipped to home.   No further needs noted.    Final next level of care: Home/Self Care Barriers to Discharge: No Barriers Identified   Patient Goals and CMS Choice Patient states their goals for this hospitalization and ongoing recovery are:: return home CMS Medicare.gov Compare Post Acute Care list provided to:: Patient Choice offered to / list presented to : Patient      Discharge Placement               Home        Discharge Plan and Services Additional resources added to the After Visit Summary for     Discharge Planning Services: CM Consult Post Acute Care Choice: Durable Medical Equipment          DME Arranged: Wheelchair manual DME Agency: [employer] Date DME Agency Contacted: 05/30/23 Time DME Agency Contacted: 1230 Representative spoke with at DME Agency: Ian Malkin HH Arranged: NA HH Agency: NA         Social Drivers of Health (SDOH) Interventions SDOH Screenings   Food Insecurity: No Food Insecurity (05/28/2023)  Housing: Unknown (05/28/2023)  Transportation Needs: No Transportation Needs (05/28/2023)  Utilities: Not At Risk (05/28/2023)  Social Connections: Patient Declined (05/28/2023)  Tobacco Use: Medium Risk (05/27/2023)     Readmission Risk Interventions    05/30/2023    2:19 PM  Readmission Risk Prevention Plan  Post Dischage Appt Complete  Medication Screening Complete  Transportation Screening Complete

## 2023-05-31 NOTE — Anesthesia Postprocedure Evaluation (Signed)
Anesthesia Post Note  Patient: Lauren King  Procedure(s) Performed: RIGHT FEMORAL ENDARTERECTOMY WITH XENSURE BOVINE PERICARDIUM PATCH AND REDO LEFT FEMORAL ENDARTERECTOMY WITH XENSURE BOVINE PERICARDIUM PATCH (Bilateral: Groin) BYPASS GRAFT RIGHT FEMORAL TO BELOW THE KNEE POPITEAL ARTERY USING NONREVERSED RIGHT GREATER SAPHENOUS VEIN (Right: Leg Upper) AORTIC STENTING USING ENDOLOGIX GRAFT APPLICATION OF SKIN SUBSTITUTE USING KERECIS SURICAL FISH SKIN GRAFT TO BILATERAL GROIN (Bilateral: Groin) THROMBECTOMY OF ILIOFEMORAL ARTERY (Right: Groin)     Patient location during evaluation: PACU Anesthesia Type: General Level of consciousness: awake and alert Pain management: pain level controlled Vital Signs Assessment: post-procedure vital signs reviewed and stable Respiratory status: spontaneous breathing, nonlabored ventilation, respiratory function stable and patient connected to nasal cannula oxygen Cardiovascular status: blood pressure returned to baseline and stable Postop Assessment: no apparent nausea or vomiting Anesthetic complications: no   No notable events documented.  Last Vitals:  Vitals:   05/30/23 0800 05/30/23 1152  BP: 125/66 122/71  Pulse: 94 96  Resp: 15 16  Temp: 36.4 C 36.4 C  SpO2: 97%     Last Pain:  Vitals:   05/30/23 1200  TempSrc:   PainSc: 0-No pain                 [place]

## 2023-06-02 ENCOUNTER — Encounter (HOSPITAL_COMMUNITY): Payer: Self-pay | Admitting: Emergency Medicine

## 2023-06-02 ENCOUNTER — Telehealth: Payer: Self-pay

## 2023-06-02 ENCOUNTER — Observation Stay (HOSPITAL_COMMUNITY): Payer: Medicare HMO

## 2023-06-02 ENCOUNTER — Emergency Department (HOSPITAL_COMMUNITY): Payer: Medicare HMO

## 2023-06-02 ENCOUNTER — Other Ambulatory Visit: Payer: Self-pay

## 2023-06-02 ENCOUNTER — Observation Stay (HOSPITAL_COMMUNITY)
Admission: EM | Admit: 2023-06-02 | Discharge: 2023-06-03 | Disposition: A | Discharge status: Home / Self Care | Payer: Medicare HMO | Attending: Internal Medicine | Admitting: Internal Medicine

## 2023-06-02 DIAGNOSIS — G459 Transient cerebral ischemic attack, unspecified: Principal | ICD-10-CM | POA: Diagnosis present

## 2023-06-02 DIAGNOSIS — Z9889 Other specified postprocedural states: Secondary | ICD-10-CM | POA: Insufficient documentation

## 2023-06-02 DIAGNOSIS — R4781 Slurred speech: Secondary | ICD-10-CM | POA: Diagnosis present

## 2023-06-02 DIAGNOSIS — E785 Hyperlipidemia, unspecified: Secondary | ICD-10-CM

## 2023-06-02 DIAGNOSIS — R7989 Other specified abnormal findings of blood chemistry: Secondary | ICD-10-CM

## 2023-06-02 DIAGNOSIS — Z8679 Personal history of other diseases of the circulatory system: Secondary | ICD-10-CM | POA: Diagnosis not present

## 2023-06-02 DIAGNOSIS — I10 Essential (primary) hypertension: Secondary | ICD-10-CM

## 2023-06-02 DIAGNOSIS — J449 Chronic obstructive pulmonary disease, unspecified: Secondary | ICD-10-CM | POA: Insufficient documentation

## 2023-06-02 DIAGNOSIS — Z79899 Other long term (current) drug therapy: Secondary | ICD-10-CM | POA: Diagnosis not present

## 2023-06-02 DIAGNOSIS — Z7902 Long term (current) use of antithrombotics/antiplatelets: Secondary | ICD-10-CM | POA: Insufficient documentation

## 2023-06-02 DIAGNOSIS — E46 Unspecified protein-calorie malnutrition: Secondary | ICD-10-CM | POA: Diagnosis not present

## 2023-06-02 DIAGNOSIS — E871 Hypo-osmolality and hyponatremia: Secondary | ICD-10-CM | POA: Diagnosis not present

## 2023-06-02 DIAGNOSIS — Z7982 Long term (current) use of aspirin: Secondary | ICD-10-CM | POA: Insufficient documentation

## 2023-06-02 DIAGNOSIS — Z87891 Personal history of nicotine dependence: Secondary | ICD-10-CM | POA: Insufficient documentation

## 2023-06-02 DIAGNOSIS — J439 Emphysema, unspecified: Secondary | ICD-10-CM

## 2023-06-02 DIAGNOSIS — I739 Peripheral vascular disease, unspecified: Secondary | ICD-10-CM | POA: Diagnosis present

## 2023-06-02 DIAGNOSIS — G4733 Obstructive sleep apnea (adult) (pediatric): Secondary | ICD-10-CM

## 2023-06-02 LAB — URINALYSIS, ROUTINE W REFLEX MICROSCOPIC
Bacteria, UA: NONE SEEN
Bilirubin Urine: NEGATIVE
Glucose, UA: NEGATIVE mg/dL
Hgb urine dipstick: NEGATIVE
Ketones, ur: NEGATIVE mg/dL
Nitrite: NEGATIVE
Protein, ur: NEGATIVE mg/dL
Specific Gravity, Urine: 1.014 (ref 1.005–1.030)
pH: 5 (ref 5.0–8.0)

## 2023-06-02 LAB — DIFFERENTIAL
Abs Immature Granulocytes: 0.16 10*3/uL — ABNORMAL HIGH (ref 0.00–0.07)
Basophils Absolute: 0.1 10*3/uL (ref 0.0–0.1)
Basophils Relative: 1 %
Eosinophils Absolute: 0.1 10*3/uL (ref 0.0–0.5)
Eosinophils Relative: 0 %
Immature Granulocytes: 1 %
Lymphocytes Relative: 8 %
Lymphs Abs: 1.1 10*3/uL (ref 0.7–4.0)
Monocytes Absolute: 1.1 10*3/uL — ABNORMAL HIGH (ref 0.1–1.0)
Monocytes Relative: 9 %
Neutro Abs: 10.2 10*3/uL — ABNORMAL HIGH (ref 1.7–7.7)
Neutrophils Relative %: 81 %

## 2023-06-02 LAB — COMPREHENSIVE METABOLIC PANEL
ALT: 148 U/L — ABNORMAL HIGH (ref 0–44)
AST: 103 U/L — ABNORMAL HIGH (ref 15–41)
Albumin: 3.2 g/dL — ABNORMAL LOW (ref 3.5–5.0)
Alkaline Phosphatase: 78 U/L (ref 38–126)
Anion gap: 12 (ref 5–15)
BUN: 24 mg/dL — ABNORMAL HIGH (ref 8–23)
CO2: 21 mmol/L — ABNORMAL LOW (ref 22–32)
Calcium: 9 mg/dL (ref 8.9–10.3)
Chloride: 96 mmol/L — ABNORMAL LOW (ref 98–111)
Creatinine, Ser: 0.93 mg/dL (ref 0.44–1.00)
GFR, Estimated: >60 mL/min (ref >60)
Glucose, Bld: 156 mg/dL — ABNORMAL HIGH (ref 70–99)
Potassium: 4.2 mmol/L (ref 3.5–5.1)
Sodium: 129 mmol/L — ABNORMAL LOW (ref 135–145)
Total Bilirubin: 0.6 mg/dL (ref 0.0–1.2)
Total Protein: 6.5 g/dL (ref 6.5–8.1)

## 2023-06-02 LAB — HEMOGLOBIN A1C
Hgb A1c MFr Bld: 5.8 % — ABNORMAL HIGH (ref 4.8–5.6)
Mean Plasma Glucose: 119.76 mg/dL

## 2023-06-02 LAB — RAPID URINE DRUG SCREEN, HOSP PERFORMED
Amphetamines: NOT DETECTED
Barbiturates: NOT DETECTED
Benzodiazepines: NOT DETECTED
Cocaine: NOT DETECTED
Opiates: POSITIVE — AB
Tetrahydrocannabinol: NOT DETECTED

## 2023-06-02 LAB — ETHANOL: Alcohol, Ethyl (B): <10 mg/dL (ref <10)

## 2023-06-02 LAB — CBC
HCT: 32.2 % — ABNORMAL LOW (ref 36.0–46.0)
Hemoglobin: 10.3 g/dL — ABNORMAL LOW (ref 12.0–15.0)
MCH: 29.4 pg (ref 26.0–34.0)
MCHC: 32 g/dL (ref 30.0–36.0)
MCV: 92 fL (ref 80.0–100.0)
Platelets: 392 10*3/uL (ref 150–400)
RBC: 3.5 MIL/uL — ABNORMAL LOW (ref 3.87–5.11)
RDW: 14.1 % (ref 11.5–15.5)
WBC: 12.6 10*3/uL — ABNORMAL HIGH (ref 4.0–10.5)
nRBC: 0 % (ref 0.0–0.2)

## 2023-06-02 LAB — PROTIME-INR
INR: 1 (ref 0.8–1.2)
Prothrombin Time: 13.7 s (ref 11.4–15.2)

## 2023-06-02 LAB — APTT: aPTT: 25 s (ref 24–36)

## 2023-06-02 MED ORDER — ROSUVASTATIN CALCIUM 5 MG PO TABS
5.0000 mg | ORAL_TABLET | Freq: Every day | ORAL | Status: DC
  Administered 2023-06-02: 5 mg via ORAL
  Filled 2023-06-02: qty 1

## 2023-06-02 MED ORDER — ENOXAPARIN SODIUM 40 MG/0.4ML IJ SOSY: 40.0000 mg | PREFILLED_SYRINGE | INTRAMUSCULAR | Status: DC

## 2023-06-02 MED ORDER — FLUTICASONE FUROATE-VILANTEROL 100-25 MCG/ACT IN AEPB
1.0000 | INHALATION_SPRAY | Freq: Every day | RESPIRATORY_TRACT | Status: DC
  Filled 2023-06-02: qty 28

## 2023-06-02 MED ORDER — SENNOSIDES-DOCUSATE SODIUM 8.6-50 MG PO TABS: 1.0000 | ORAL_TABLET | Freq: Every evening | ORAL | Status: DC | PRN

## 2023-06-02 MED ORDER — ACETAMINOPHEN 650 MG RE SUPP: 650.0000 mg | RECTAL | Status: DC | PRN

## 2023-06-02 MED ORDER — STROKE: EARLY STAGES OF RECOVERY BOOK: Freq: Once | Status: DC

## 2023-06-02 MED ORDER — IOHEXOL 350 MG/ML SOLN
75.0000 mL | Freq: Once | INTRAVENOUS | Status: AC | PRN
  Administered 2023-06-02: 75 mL via INTRAVENOUS

## 2023-06-02 MED ORDER — ACETAMINOPHEN 325 MG PO TABS
650.0000 mg | ORAL_TABLET | ORAL | Status: DC | PRN
Start: 2023-06-02 — End: 2023-06-03
  Administered 2023-06-03: 650 mg via ORAL
  Filled 2023-06-02: qty 2

## 2023-06-02 MED ORDER — ACETAMINOPHEN 160 MG/5ML PO SOLN: 650.0000 mg | ORAL | Status: DC | PRN

## 2023-06-02 MED ORDER — UMECLIDINIUM BROMIDE 62.5 MCG/ACT IN AEPB
1.0000 | INHALATION_SPRAY | Freq: Every day | RESPIRATORY_TRACT | Status: DC
  Filled 2023-06-02: qty 7

## 2023-06-02 MED ORDER — METOPROLOL TARTRATE 5 MG/5ML IV SOLN: 5.0000 mg | INTRAVENOUS | Status: DC | PRN

## 2023-06-02 NOTE — ED Triage Notes (Signed)
Pt arrives with reports of drainage of her right leg wound. Pt was found to have slurred speech, lip numbness, and tongue numbness. LKW 1515.

## 2023-06-02 NOTE — ED Notes (Signed)
Patient returned from CT

## 2023-06-02 NOTE — ED Provider Notes (Signed)
[place] EMERGENCY DEPARTMENT AT [facility] Provider Note  CSN: [id number] Arrival date & time: 06/02/23 1802  Chief Complaint(s) Code Stroke  HPI Lauren King is a 73 y.o. female history of COPD, peripheral vascular disease, presenting to the emergency department with slurred speech.  Patient initially came to the emergency department for wound check.  She had a femoral endarterectomy of both legs a few days ago, presented to the emergency department with some drainage from her left femoral site, reports clear drainage.  Denies any pain in her legs.  Has been doing well otherwise since the procedure.  Apparently around 330, patient developed some slurred speech and numbness to her tongue.  Code stroke was called and patient was evaluated by neurology.  She denies any numbness or tingling elsewhere, pain in her arms or legs or weakness in her arms or legs, facial droop, trouble swallowing, any other new symptoms.  No chest pain or back pain.   Past Medical History Past Medical History:  Diagnosis Date   Anxiety    Chronic lower back pain    COPD (chronic obstructive pulmonary disease) (HCC)    Depression    GERD (gastroesophageal reflux disease)    Hypertension    Obstructive sleep apnea    CPAP   Peripheral vascular disease (HCC)    Patient Active Problem List   Diagnosis Date Noted   TIA (transient ischemic attack) 06/02/2023   HTN (hypertension) 06/02/2023   HLD (hyperlipidemia) 06/02/2023   OSA (obstructive sleep apnea) 06/02/2023   Critical limb ischemia of right lower extremity (HCC) 05/27/2023   Aortoiliac occlusive disease (HCC) 05/27/2023   COPD (chronic obstructive pulmonary disease) (HCC) 01/05/2022   Delusions (HCC) 01/03/2022   Hyponatremia 01/03/2022   Urinary tract infection in elderly patient 01/03/2022   PAD (peripheral artery disease) (HCC) 12/22/2018   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/11/2018   Major  depressive disorder, single episode 03/22/2017   Diverticulosis of colon without hemorrhage    Reflux esophagitis    Schatzki's ring    Gastric ulceration    Hiatal hernia    Encounter for screening colonoscopy 02/06/2015   Constipation 01/02/2015   Esophageal dysphagia 09/21/2012   GERD (gastroesophageal reflux disease) 09/21/2012   Loss of weight 09/21/2012   Home Medication(s) Prior to Admission medications   Medication Sig Start Date End Date Taking? Authorizing Provider  albuterol (VENTOLIN HFA) 108 (90 Base) MCG/ACT inhaler Inhale 1-2 puffs into the lungs every 6 (six) hours as needed for wheezing or shortness of breath.    [provider]  aspirin EC 81 MG tablet Take 81 mg by mouth daily.    [provider]  clopidogrel (PLAVIX) 75 MG tablet Take 1 tablet (75 mg total) by mouth daily. 10/23/20   Chuck Hint, MD  losartan-hydrochlorothiazide (HYZAAR) 50-12.5 MG tablet Take 1 tablet by mouth daily. 09/29/21   [provider]  mirtazapine (REMERON) 15 MG tablet Take 15 mg by mouth at bedtime. 04/04/23   [provider]  Oxycodone HCl 10 MG TABS Take 10 mg by mouth every 4 (four) hours as needed for severe pain. 10/15/21   [provider]  pregabalin (LYRICA) 25 MG capsule Take 1 capsule (25 mg total) by mouth 2 (two) times daily. Patient not taking: Reported on 03/07/2023 07/19/22   Nada Libman, MD  rosuvastatin (CRESTOR) 5 MG tablet Take 1 tablet (5 mg total) by mouth daily. Patient taking differently: Take 5  mg by mouth at bedtime. 11/13/18   Nada Libman, MD  TRELEGY ELLIPTA 100-62.5-25 MCG/INH AEPB Inhale 1 puff into the lungs daily as needed (Shortness of breath).    [provider]                                                                                                                                    Past Surgical History Past Surgical History:  Procedure Laterality Date   ABDOMINAL AORTIC  ENDOVASCULAR STENT GRAFT N/A 05/27/2023   Procedure: AORTIC STENTING USING ENDOLOGIX GRAFT;  Surgeon: Nada Libman, MD;  Location: [facility] OR;  Service: Vascular;  Laterality: N/A;   ABDOMINAL AORTOGRAM W/LOWER EXTREMITY N/A 09/11/2019   Procedure: ABDOMINAL AORTOGRAM W/LOWER EXTREMITY;  Surgeon: Nada Libman, MD;  Location: [facility] IN[facility];  Service: Cardiovascular;  Laterality: N/A;   ABDOMINAL AORTOGRAM W/LOWER EXTREMITY N/A 10/23/2019   Procedure: ABDOMINAL AORTOGRAM W/LOWER EXTREMITY;  Surgeon: Nada Libman, MD;  Location: [facility];  Service: Cardiovascular;  Laterality: N/A;   ABDOMINAL AORTOGRAM W/LOWER EXTREMITY N/A 02/02/2022   Procedure: ABDOMINAL AORTOGRAM W/LOWER EXTREMITY;  Surgeon: Nada Libman, MD;  Location: [facility];  Service: Cardiovascular;  Laterality: N/A;   ABDOMINAL AORTOGRAM W/LOWER EXTREMITY N/A 03/23/2022   Procedure: ABDOMINAL AORTOGRAM W/LOWER EXTREMITY;  Surgeon: Nada Libman, MD;  Location: [facility];  Service: Cardiovascular;  Laterality: N/A;   ABDOMINAL AORTOGRAM W/LOWER EXTREMITY N/A 03/29/2023   Procedure: ABDOMINAL AORTOGRAM W/LOWER EXTREMITY;  Surgeon: Nada Libman, MD;  Location: [facility];  Service: Cardiovascular;  Laterality: N/A;   ABDOMINAL HYSTERECTOMY     BYPASS GRAFT FEMORAL-PERONEAL Right 05/27/2023   Procedure: BYPASS GRAFT RIGHT FEMORAL TO BELOW THE KNEE POPITEAL ARTERY USING NONREVERSED RIGHT GREATER SAPHENOUS VEIN;  Surgeon: Nada Libman, MD;  Location: [facility] OR;  Service: Vascular;  Laterality: Right;   CATARACT EXTRACTION     COLONOSCOPY  01/20/2005   VHQ:IONGEXBMW colon polyp/Diminutive polyps at 30 cm ablated/Normal rectum. hyperplastic polyp   COLONOSCOPY N/A 03/25/2015   UXL:KGMWNU /left-sided diverticula   ENDARTERECTOMY FEMORAL Left 12/22/2018   Procedure: Endarterectomy Left Femoral and Politeal Arteries;  Surgeon: Nada Libman, MD;  Location: [facility] OR;  Service: Vascular;   Laterality: Left;   ENDARTERECTOMY FEMORAL Bilateral 05/27/2023   Procedure: RIGHT FEMORAL ENDARTERECTOMY WITH XENSURE BOVINE PERICARDIUM PATCH AND REDO LEFT FEMORAL ENDARTERECTOMY WITH XENSURE BOVINE PERICARDIUM PATCH;  Surgeon: Nada Libman, MD;  Location: [facility] OR;  Service: Vascular;  Laterality: Bilateral;   ESOPHAGOGASTRODUODENOSCOPY  11/30/2004   RMR:A small patch of salmon-colored epithelium (salmon-like lesion in the distal esophagus)/Antral pre-pyloric erosions of uncertain significance, otherwise, normal. solitary duodenal bulbar AVM.No Barrett's.   ESOPHAGOGASTRODUODENOSCOPY N/A 03/25/2015   UVO:ZDGUYQI reflux/schatzkis ring s/p dilation/HH/short segment barrett   ESOPHAGOGASTRODUODENOSCOPY (EGD) WITH ESOPHAGEAL DILATION N/A 09/22/2012   Rourk: erosive RE, biopsy negative for Barrett's. Gastritis.  FEMORAL-TIBIAL BYPASS GRAFT Left 12/22/2018   Procedure: BYPASS GRAFT FEMORAL-TIBIAL ARTERY LEFT LEG USING NONREVERSED LEFT GREAT SAPHENOUS VEIN;  Surgeon: Nada Libman, MD;  Location: [facility] OR;  Service: Vascular;  Laterality: Left;   LOWER EXTREMITY ANGIOGRAPHY N/A 12/27/2017   Procedure: LOWER EXTREMITY ANGIOGRAPHY;  Surgeon: Nada Libman, MD;  [facility]: [facility];  Service: Cardiovascular;  Laterality: N/A;   LOWER EXTREMITY ANGIOGRAPHY N/A 11/21/2018   Procedure: LOWER EXTREMITY ANGIOGRAPHY;  Surgeon: Nada Libman, MD;  [facility]: [facility];  Service: Cardiovascular;  Laterality: N/A;   MALONEY DILATION N/A 03/25/2015   Procedure: Elease Hashimoto DILATION;  Surgeon: Corbin Ade, MD;  [facility];  Service: Endoscopy;  Laterality: N/A;   PERIPHERAL VASCULAR BALLOON ANGIOPLASTY  02/02/2022   Procedure: PERIPHERAL VASCULAR BALLOON ANGIOPLASTY;  Surgeon: Nada Libman, MD;  Location: [facility];  Service: Cardiovascular;;   PERIPHERAL VASCULAR INTERVENTION Left 09/11/2019   Procedure: PERIPHERAL VASCULAR INTERVENTION;  Surgeon: Nada Libman, MD;   [facility]: [facility];  Service: Cardiovascular;  Laterality: Left;   PERIPHERAL VASCULAR INTERVENTION Right 10/23/2019   Procedure: PERIPHERAL VASCULAR INTERVENTION;  Surgeon: Nada Libman, MD;  Location: [facility];  Service: Cardiovascular;  Laterality: Right;  external iliac   PERIPHERAL VASCULAR INTERVENTION  03/23/2022   Procedure: PERIPHERAL VASCULAR INTERVENTION;  Surgeon: Nada Libman, MD;  Location: [facility];  Service: Cardiovascular;;   PERIPHERAL VASCULAR THROMBECTOMY  03/23/2022   Procedure: PERIPHERAL VASCULAR THROMBECTOMY;  Surgeon: Nada Libman, MD;  Location: [facility];  Service: Cardiovascular;;   SKIN CANCER DESTRUCTION     THROMBECTOMY FEMORAL ARTERY Right 05/27/2023   Procedure: THROMBECTOMY OF ILIOFEMORAL ARTERY;  Surgeon: Nada Libman, MD;  Location: [facility] OR;  Service: Vascular;  Laterality: Right;   TUBAL LIGATION     with incidental appendectomy   VEIN HARVEST Left 12/22/2018   Procedure: Vein Harvest Left Great Saphenous;  Surgeon: Nada Libman, MD;  Location: [facility] OR;  Service: Vascular;  Laterality: Left;   Family History Family History  Problem Relation Age of Onset   Lung cancer Sister    Lung cancer Brother    Colon cancer Neg Hx     Social History Social History   Tobacco Use   Smoking status: Former    Average packs/day: 0.4 packs/day for 40.0 years (16.0 ttl pk-yrs)    Types: Cigarettes    Start date: 05/11/2022    Quit date: 1972    Years since quitting: 53.0   Smokeless tobacco: Never  Vaping Use   Vaping status: Some Days   Substances: Nicotine  Substance Use Topics   Alcohol use: Yes    Comment: maybe 1 drink every couple of months   Allergies Patient has no known allergies.  Review of Systems Review of Systems  All other systems reviewed and are negative.   Physical Exam Vital Signs  I have reviewed the triage vital signs BP 131/61   Pulse 98   Temp 98 F (36.7 C)   Resp 17    SpO2 93%  Physical Exam Vitals and nursing note reviewed.  Constitutional:      General: She is not in acute distress.    Appearance: She is well-developed.  HENT:     Head: Normocephalic and atraumatic.     Mouth/Throat:     Mouth: Mucous membranes are moist.  Eyes:     Pupils: Pupils are equal,  round, and reactive to light.  Cardiovascular:     Rate and Rhythm: Normal rate and regular rhythm.     Heart sounds: No murmur heard. Pulmonary:     Effort: Pulmonary effort is normal. No respiratory distress.     Breath sounds: Normal breath sounds.  Abdominal:     General: Abdomen is flat.     Palpations: Abdomen is soft.     Tenderness: There is no abdominal tenderness.  Musculoskeletal:        General: No tenderness.     Right lower leg: No edema.     Left lower leg: No edema.     Comments: Bilateral femoral incisions with no surrounding erythema, warmth, drainage, bleeding.  Mild surrounding tenderness bilaterally  Skin:    General: Skin is warm and dry.  Neurological:     Mental Status: She is alert.     Comments: Cranial nerves II through XII intact with questionable dysarthria, strength 5 out of 5 in the bilateral upper and lower extremities  Psychiatric:        Mood and Affect: Mood normal.        Behavior: Behavior normal.     ED Results and Treatments Labs (all labs ordered are listed, but only abnormal results are displayed) Labs Reviewed  CBC - Abnormal; Notable for the following components:      Result Value   WBC 12.6 (*)    RBC 3.50 (*)    Hemoglobin 10.3 (*)    HCT 32.2 (*)    All other components within normal limits  DIFFERENTIAL - Abnormal; Notable for the following components:   Neutro Abs 10.2 (*)    Monocytes Absolute 1.1 (*)    Abs Immature Granulocytes 0.16 (*)    All other components within normal limits  COMPREHENSIVE METABOLIC PANEL - Abnormal; Notable for the following components:   Sodium 129 (*)    Chloride 96 (*)    CO2 21 (*)     Glucose, Bld 156 (*)    BUN 24 (*)    Albumin 3.2 (*)    AST 103 (*)    ALT 148 (*)    All other components within normal limits  ETHANOL  PROTIME-INR  APTT  RAPID URINE DRUG SCREEN, HOSP PERFORMED  URINALYSIS, ROUTINE W REFLEX MICROSCOPIC  LIPID PANEL  HEMOGLOBIN A1C  CBC WITH DIFFERENTIAL/PLATELET  COMPREHENSIVE METABOLIC PANEL  I-STAT CHEM 8, ED                                                                                                                          Radiology CT HEAD CODE STROKE WO CONTRAST Result Date: 06/02/2023 CLINICAL DATA:  Code stroke. Neuro deficit, acute, stroke suspected. Slurred speech. EXAM: CT HEAD WITHOUT CONTRAST TECHNIQUE: Contiguous axial images were obtained from the base of the skull through the vertex without intravenous contrast. RADIATION DOSE REDUCTION: This exam was performed according to the departmental dose-optimization program which includes automated exposure control,  adjustment of the mA and/or kV according to patient size and/or use of iterative reconstruction technique. COMPARISON:  CT head without contrast 12/25/2009 FINDINGS: Brain: No acute infarct, hemorrhage, or mass lesion is present. A benign appearing cystic structure just superior to the left lateral ventricle is new since the prior exam. It measures 12 x 12 x 16 mm. No other focal lesions are present. White matter is otherwise within normal limits. Deep brain nuclei are within normal limits. The ventricles are of normal size. No significant extraaxial fluid collection is present. The brainstem and cerebellum are within normal limits. Midline structures are within normal limits. Vascular: Minimal atherosclerotic calcifications are present within the cavernous internal carotid arteries bilaterally. No hyperdense vessel is present. Skull: Calvarium is intact. No focal lytic or blastic lesions are present. No significant extracranial soft tissue lesion is present. Sinuses/Orbits: The  paranasal sinuses and mastoid air cells are clear. Bilateral lens replacements are noted. Globes and orbits are otherwise unremarkable. ASPECTS [facility] Stroke Program Early CT Score) - Ganglionic level infarction (caudate, lentiform nuclei, internal capsule, insula, M1-M3 cortex): 7/7 - Supraganglionic infarction (M4-M6 cortex): 3/3 Total score (0-10 with 10 being normal): 10/10 IMPRESSION: 1. No acute intracranial abnormality or significant interval change. 2. Aspects is 10/10. 3. Benign appearing cystic structure just superior to the left lateral ventricle is new since the prior exam. 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 Recommend non emergent MRI of the brain without and with contrast for further evaluation. The above was relayed via text pager to Dr. Rollene Fare on 06/02/2023 at 18:43 . Electronically Signed   By: Marin Roberts M.D.   On: 06/02/2023 18:43    Pertinent labs & imaging results that were available during my care of the patient were reviewed by me and considered in my medical decision making (see MDM for details).  Medications Ordered in ED Medications   stroke: early stages of recovery book (has no administration in time range)  acetaminophen (TYLENOL) tablet 650 mg (has no administration in time range)    Or  acetaminophen (TYLENOL) 160 MG/5ML solution 650 mg (has no administration in time range)    Or  acetaminophen (TYLENOL) suppository 650 mg (has no administration in time range)  senna-docusate (Senokot-S) tablet 1 tablet (has no administration in time range)  rosuvastatin (CRESTOR) tablet 5 mg (has no administration in time range)  fluticasone furoate-vilanterol (BREO ELLIPTA) 100-25 MCG/ACT 1 puff (has no administration in time range)    And  umeclidinium bromide (INCRUSE ELLIPTA) 62.5 MCG/ACT 1 puff (has no administration in time range)  metoprolol tartrate (LOPRESSOR) injection 5 mg (has no administration in time range)                                                                                                                                      Procedures .Critical Care  Performed by: Lonell Grandchild, MD Authorized by: Lonell Grandchild,  MD   Critical care provider statement:    Critical care time (minutes):  30   Critical care was necessary to treat or prevent imminent or life-threatening deterioration of the following conditions:  CNS failure or compromise   Critical care was time spent personally by me on the following activities:  Development of treatment plan with patient or surrogate, discussions with consultants, evaluation of patient's response to treatment, examination of patient, ordering and review of laboratory studies, ordering and review of radiographic studies, ordering and performing treatments and interventions, pulse oximetry, re-evaluation of patient's condition and review of old charts   (including critical care time)  Medical Decision Making / ED Course   MDM:  73 year old evaluated for drainage as well as episode of slurred speech.  Patient overall well-appearing, does have some slight dysarthria on exam.  Code stroke was activated.  Patient was evaluate by neurology.  TNK not given due to minor and resolving symptoms per neurology recommendation.  Patient did not meet LVO criteria.  Neurology recommends patient be admitted for TIA/stroke workup.  No evidence of intracranial bleeding.  Patient also evaluated for apparent drainage from leg.  On my evaluation, I did not appreciate any drainage, she has appropriate postoperative tenderness to both femoral incisional sites.  Her legs are warm and well-perfused, capillary refill slightly diminished but intact bilaterally.  May need vascular consultation but do not think she has any acute limb ischemia needing emergent consult.  I do not think she has an infection in her postoperative incision sites.  Discussed with Dr. Joneen Roach who will admit the patient for further  workup.      Additional history obtained: -Additional history obtained from family -External records from outside source obtained and reviewed including: Chart review including previous notes, labs, imaging, consultation notes including recent op note    Lab Tests: -I ordered, reviewed, and interpreted labs.   The pertinent results include:   Labs Reviewed  CBC - Abnormal; Notable for the following components:      Result Value   WBC 12.6 (*)    RBC 3.50 (*)    Hemoglobin 10.3 (*)    HCT 32.2 (*)    All other components within normal limits  DIFFERENTIAL - Abnormal; Notable for the following components:   Neutro Abs 10.2 (*)    Monocytes Absolute 1.1 (*)    Abs Immature Granulocytes 0.16 (*)    All other components within normal limits  COMPREHENSIVE METABOLIC PANEL - Abnormal; Notable for the following components:   Sodium 129 (*)    Chloride 96 (*)    CO2 21 (*)    Glucose, Bld 156 (*)    BUN 24 (*)    Albumin 3.2 (*)    AST 103 (*)    ALT 148 (*)    All other components within normal limits  ETHANOL  PROTIME-INR  APTT  RAPID URINE DRUG SCREEN, HOSP PERFORMED  URINALYSIS, ROUTINE W REFLEX MICROSCOPIC  LIPID PANEL  HEMOGLOBIN A1C  CBC WITH DIFFERENTIAL/PLATELET  COMPREHENSIVE METABOLIC PANEL  I-STAT CHEM 8, ED    Notable for mild leukocytosis   EKG   EKG Interpretation Date/Time:  Thursday June 02 2023 20:38:31 EST Ventricular Rate:  77 PR Interval:  171 QRS Duration:  86 QT Interval:  407 QTC Calculation: 461 R Axis:   87  Text Interpretation: Sinus rhythm Borderline right axis deviation Confirmed by Alvino Blood ([id number]) on 06/02/2023 8:40:12 PM         Imaging Studies  ordered: I ordered imaging studies including CT head On my interpretation imaging demonstrates no acute bleeding  I independently visualized and interpreted imaging. I agree with the radiologist interpretation   Medicines ordered and prescription drug management: Meds  ordered this encounter  Medications    stroke: early stages of recovery book   DISCONTD:  stroke: early stages of recovery book   OR Linked Order Group    acetaminophen (TYLENOL) tablet 650 mg    acetaminophen (TYLENOL) 160 MG/5ML solution 650 mg    acetaminophen (TYLENOL) suppository 650 mg   senna-docusate (Senokot-S) tablet 1 tablet   DISCONTD: enoxaparin (LOVENOX) injection 40 mg   rosuvastatin (CRESTOR) tablet 5 mg   AND Linked Order Group    fluticasone furoate-vilanterol (BREO ELLIPTA) 100-25 MCG/ACT 1 puff    umeclidinium bromide (INCRUSE ELLIPTA) 62.5 MCG/ACT 1 puff   metoprolol tartrate (LOPRESSOR) injection 5 mg    -I have reviewed the patients home medicines and have made adjustments as needed   Consultations Obtained: I requested consultation with the neurologist,  and discussed lab and imaging findings as well as pertinent plan - they recommend: admit for TIA workup   Cardiac Monitoring: The patient was maintained on a cardiac monitor.  I personally viewed and interpreted the cardiac monitored which showed an underlying rhythm of: NSR  Social Determinants of Health:  Diagnosis or treatment significantly limited by social determinants of health: former smoker   Reevaluation: After the interventions noted above, I reevaluated the patient and found that their symptoms have improved  Co morbidities that complicate the patient evaluation  Past Medical History:  Diagnosis Date   Anxiety    Chronic lower back pain    COPD (chronic obstructive pulmonary disease) (HCC)    Depression    GERD (gastroesophageal reflux disease)    Hypertension    Obstructive sleep apnea    CPAP   Peripheral vascular disease (HCC)       Dispostion: Disposition decision including need for hospitalization was considered, and patient admitted to the hospital.    Final Clinical Impression(s) / ED Diagnoses Final diagnoses:  TIA (transient ischemic attack)     This chart was  dictated using voice recognition software.  Despite best efforts to proofread,  errors can occur which can change the documentation meaning.    Lonell Grandchild, MD 06/02/23 2041

## 2023-06-02 NOTE — Code Documentation (Signed)
Lauren King is a 73 yr old female with recent vascular surgery on rt leg arriving to [facility] via POV on 06/02/2023. Pt is coming from home where she had a witnessed onset on dysarthria today at 1515. Code stroke activated in triage. Pt on aspirin and plavix per chart.     Pt met by stroke team in CT scan. NIHSS 1. Pt has drift in her rt leg, which is swollen and oozing. No dysarthria noted. The following imaging was obtained: CT. Per Dr. Iver Nestle, CT is negative for hemorrhage.     Pt back to ED room 19 where her workup will continue. She will need q 2 hr VS and NIHSS. NPO until stroke swallow screen passed. Pt is ineligible for TNK as no stroke symptoms. Pt not candidate for NIR as no stroke symptoms. Bedside handoff with ED RN complete.

## 2023-06-02 NOTE — ED Notes (Signed)
Patient transported to CT 

## 2023-06-02 NOTE — ED Notes (Signed)
Patient transported to MRI 

## 2023-06-02 NOTE — Consult Note (Signed)
NEUROLOGY CONSULT NOTE   Date of service: June 02, 2023 Patient Name: Lauren King MRN:  [id number] DOB:  06/21/1950 Chief Complaint: "Slurred speech" Requesting Provider: Lonell Grandchild, MD  History of Present Illness  Lauren King is a 73 y.o. female with hx of critical limb ischemia of the right lower extremity s/p vascular procedure (aortobiiliac stent 05/27/2023, right femoral endarterectomy with patch angioplasty, right iliofemoral and profunda femoral thrombectomy, left femoral endarterectomy with patch, angioplasty of the bilateral external iliac arteries, right fem to below-knee popliteal artery bypass graft) on dual antiplatelet therapy, hypertension, hyperlipidemia, COPD, former smoking,  She reportedly had sudden onset slurred speech and unilateral tingling of the face and tongue at 3:15 PM.  Symptoms were improving by the time of her arrival by private vehicle to the ED.    LKW: 3:15 PM Modified rankin score: 2-Slight disability-UNABLE to perform all activities but does not need assistance IV Thrombolysis: No, recent vascular surgery and too mild to treat EVT: No, exam not consistent with LVO   NIHSS components Score: Comment  1a Level of Conscious 0[x]  1[]  2[]  3[]      1b LOC Questions 0[x]  1[]  2[]       1c LOC Commands 0[x]  1[]  2[]       2 Best Gaze 0[x]  1[]  2[]       3 Visual 0[x]  1[]  2[]  3[]      4 Facial Palsy 0[x]  1[]  2[]  3[]      5a Motor Arm - left 0[x]  1[]  2[]  3[]  4[]  UN[]    5b Motor Arm - Right 0[x]  1[]  2[]  3[]  4[]  UN[]    6a Motor Leg - Left 0[x]  1[]  2[]  3[]  4[]  UN[]    6b Motor Leg - Right 0[]  1[x]  2[]  3[]  4[]  UN[]    7 Limb Ataxia 0[x]  1[]  2[]  3[]  UN[]     8 Sensory 0[x]  1[]  2[]  UN[]      9 Best Language 0[x]  1[]  2[]  3[]      10 Dysarthria 0[]  1[x]  2[]  UN[]      11 Extinct. and Inattention 0[x]  1[]  2[]       TOTAL:       ROS  Comprehensive ROS performed and pertinent positives documented in HPI as well as: Some cough with increased yellow sputum and  constipation    Past History   Past Medical History:  Diagnosis Date   Anxiety    Chronic lower back pain    COPD (chronic obstructive pulmonary disease) (HCC)    Depression    GERD (gastroesophageal reflux disease)    Hypertension    Obstructive sleep apnea    CPAP   Peripheral vascular disease (HCC)     Past Surgical History:  Procedure Laterality Date   ABDOMINAL AORTIC ENDOVASCULAR STENT GRAFT N/A 05/27/2023   Procedure: AORTIC STENTING USING ENDOLOGIX GRAFT;  Surgeon: Nada Libman, MD;  Location: [facility] OR;  Service: Vascular;  Laterality: N/A;   ABDOMINAL AORTOGRAM W/LOWER EXTREMITY N/A 09/11/2019   Procedure: ABDOMINAL AORTOGRAM W/LOWER EXTREMITY;  Surgeon: Nada Libman, MD;  Location: [facility];  Service: Cardiovascular;  Laterality: N/A;   ABDOMINAL AORTOGRAM W/LOWER EXTREMITY N/A 10/23/2019   Procedure: ABDOMINAL AORTOGRAM W/LOWER EXTREMITY;  Surgeon: Nada Libman, MD;  Location: [facility];  Service: Cardiovascular;  Laterality: N/A;   ABDOMINAL AORTOGRAM W/LOWER EXTREMITY N/A 02/02/2022   Procedure: ABDOMINAL AORTOGRAM W/LOWER EXTREMITY;  Surgeon: Nada Libman, MD;  Location: [facility];  Service: Cardiovascular;  Laterality: N/A;   ABDOMINAL AORTOGRAM W/LOWER EXTREMITY N/A 03/23/2022  Procedure: ABDOMINAL AORTOGRAM W/LOWER EXTREMITY;  Surgeon: Nada Libman, MD;  Location: [facility];  Service: Cardiovascular;  Laterality: N/A;   ABDOMINAL AORTOGRAM W/LOWER EXTREMITY N/A 03/29/2023   Procedure: ABDOMINAL AORTOGRAM W/LOWER EXTREMITY;  Surgeon: Nada Libman, MD;  Location: [facility];  Service: Cardiovascular;  Laterality: N/A;   ABDOMINAL HYSTERECTOMY     BYPASS GRAFT FEMORAL-PERONEAL Right 05/27/2023   Procedure: BYPASS GRAFT RIGHT FEMORAL TO BELOW THE KNEE POPITEAL ARTERY USING NONREVERSED RIGHT GREATER SAPHENOUS VEIN;  Surgeon: Nada Libman, MD;  Location: [facility];  Service: Vascular;  Laterality: Right;    CATARACT EXTRACTION     COLONOSCOPY  01/20/2005   ZOX:[id number] colon polyp/Diminutive polyps at 30 cm ablated/Normal rectum. hyperplastic polyp   COLONOSCOPY N/A 03/25/2015   WJX:BJYNWG /left-sided diverticula   ENDARTERECTOMY FEMORAL Left 12/22/2018   Procedure: Endarterectomy Left Femoral and Politeal Arteries;  Surgeon: Nada Libman, MD;  Location: [facility] OR;  Service: Vascular;  Laterality: Left;   ENDARTERECTOMY FEMORAL Bilateral 05/27/2023   Procedure: RIGHT FEMORAL ENDARTERECTOMY WITH XENSURE BOVINE PERICARDIUM PATCH AND REDO LEFT FEMORAL ENDARTERECTOMY WITH XENSURE BOVINE PERICARDIUM PATCH;  Surgeon: Nada Libman, MD;  Location: [facility];  Service: Vascular;  Laterality: Bilateral;   ESOPHAGOGASTRODUODENOSCOPY  11/30/2004   RMR:A small patch of salmon-colored epithelium (salmon-like lesion in the distal esophagus)/Antral pre-pyloric erosions of uncertain significance, otherwise, normal. solitary duodenal bulbar AVM.No Barrett's.   ESOPHAGOGASTRODUODENOSCOPY N/A 03/25/2015   NFA:OZHYQMV reflux/schatzkis ring s/p dilation/HH/short segment barrett   ESOPHAGOGASTRODUODENOSCOPY (EGD) WITH ESOPHAGEAL DILATION N/A 09/22/2012   Rourk: erosive RE, biopsy negative for Barrett's. Gastritis.    FEMORAL-TIBIAL BYPASS GRAFT Left 12/22/2018   Procedure: BYPASS GRAFT FEMORAL-TIBIAL ARTERY LEFT LEG USING NONREVERSED LEFT GREAT SAPHENOUS VEIN;  Surgeon: Nada Libman, MD;  Location: [facility];  Service: Vascular;  Laterality: Left;   LOWER EXTREMITY ANGIOGRAPHY N/A 12/27/2017   Procedure: LOWER EXTREMITY ANGIOGRAPHY;  Surgeon: Nada Libman, MD;  [facility]: [facility];  Service: Cardiovascular;  Laterality: N/A;   LOWER EXTREMITY ANGIOGRAPHY N/A 11/21/2018   Procedure: LOWER EXTREMITY ANGIOGRAPHY;  Surgeon: Nada Libman, MD;  [facility]: [facility];  Service: Cardiovascular;  Laterality: N/A;   MALONEY DILATION N/A 03/25/2015   Procedure: Elease Hashimoto DILATION;  Surgeon: Corbin Ade, MD;   Location: [facility];  Service: Endoscopy;  Laterality: N/A;   PERIPHERAL VASCULAR BALLOON ANGIOPLASTY  02/02/2022   Procedure: PERIPHERAL VASCULAR BALLOON ANGIOPLASTY;  Surgeon: Nada Libman, MD;  Location: [facility];  Service: Cardiovascular;;   PERIPHERAL VASCULAR INTERVENTION Left 09/11/2019   Procedure: PERIPHERAL VASCULAR INTERVENTION;  Surgeon: Nada Libman, MD;  Location: [facility];  Service: Cardiovascular;  Laterality: Left;   PERIPHERAL VASCULAR INTERVENTION Right 10/23/2019   Procedure: PERIPHERAL VASCULAR INTERVENTION;  Surgeon: Nada Libman, MD;  Location: [facility];  Service: Cardiovascular;  Laterality: Right;  external iliac   PERIPHERAL VASCULAR INTERVENTION  03/23/2022   Procedure: PERIPHERAL VASCULAR INTERVENTION;  Surgeon: Nada Libman, MD;  Location: [facility];  Service: Cardiovascular;;   PERIPHERAL VASCULAR THROMBECTOMY  03/23/2022   Procedure: PERIPHERAL VASCULAR THROMBECTOMY;  Surgeon: Nada Libman, MD;  Location: [facility];  Service: Cardiovascular;;   SKIN CANCER DESTRUCTION     THROMBECTOMY FEMORAL ARTERY Right 05/27/2023   Procedure: THROMBECTOMY OF ILIOFEMORAL ARTERY;  Surgeon: Nada Libman, MD;  Location: [facility] OR;  Service: Vascular;  Laterality: Right;   TUBAL LIGATION  with incidental appendectomy   VEIN HARVEST Left 12/22/2018   Procedure: Vein Harvest Left Great Saphenous;  Surgeon: Nada Libman, MD;  Location: [facility] OR;  Service: Vascular;  Laterality: Left;    Family History: Family History  Problem Relation Age of Onset   Lung cancer Sister    Lung cancer Brother    Colon cancer Neg Hx     Social History  reports that she quit smoking about 53 years ago. Her smoking use included cigarettes. She started smoking about 12 months ago. She has a 16 pack-year smoking history. She has never used smokeless tobacco. She reports current alcohol use.  Drug: Marijuana.  No Known  Allergies  Medications  No current facility-administered medications for this encounter.  Current Outpatient Medications:    albuterol (VENTOLIN HFA) 108 (90 Base) MCG/ACT inhaler, Inhale 1-2 puffs into the lungs every 6 (six) hours as needed for wheezing or shortness of breath., Disp: , Rfl:    aspirin EC 81 MG tablet, Take 81 mg by mouth daily., Disp: , Rfl:    clopidogrel (PLAVIX) 75 MG tablet, Take 1 tablet (75 mg total) by mouth daily., Disp: 30 tablet, Rfl: 6   losartan-hydrochlorothiazide (HYZAAR) 50-12.5 MG tablet, Take 1 tablet by mouth daily., Disp: , Rfl:    mirtazapine (REMERON) 15 MG tablet, Take 15 mg by mouth at bedtime., Disp: , Rfl:    Oxycodone HCl 10 MG TABS, Take 10 mg by mouth every 4 (four) hours as needed for severe pain., Disp: , Rfl:    pregabalin (LYRICA) 25 MG capsule, Take 1 capsule (25 mg total) by mouth 2 (two) times daily. (Patient not taking: Reported on 03/07/2023), Disp: 50 capsule, Rfl: 3   rosuvastatin (CRESTOR) 5 MG tablet, Take 1 tablet (5 mg total) by mouth daily. (Patient taking differently: Take 5 mg by mouth at bedtime.), Disp: 30 tablet, Rfl: 3   TRELEGY ELLIPTA 100-62.5-25 MCG/INH AEPB, Inhale 1 puff into the lungs daily as needed (Shortness of breath)., Disp: , Rfl:   Vitals   Vitals:   06/02/23 1817  BP: (!) 118/92  Pulse: 98  Resp: (!) 22  Temp: 98 F (36.7 C)  SpO2: 92%    There is no height or weight on file to calculate BMI.  Physical Exam   Constitutional: Appears thin, frail but energetic and well Psych: Affect cheerful and cooperative Eyes: No scleral injection.  HENT: No OP obstruction.  Moderately poor dentition with some missing teeth Head: Normocephalic.  Cardiovascular: Normal rate and regular rhythm.  Respiratory: Effort normal, non-labored breathing.  GI: Soft.  No distension. There is no tenderness.  Skin: Healing right lower extremity wound with some swelling of the right lower extremity  Neurologic Examination     Neuro: Mental Status: Patient is awake, alert, oriented to person, place, month, year, and situation. Patient is able to give a clear and coherent history. No signs of aphasia or neglect Cranial Nerves: II: Visual Fields are full. Pupils are equal, round, and reactive to light.   III,IV, VI: EOMI without ptosis or diploplia.  V: Facial sensation is symmetric to light touch VII: Facial movement is symmetric.  VIII: hearing is intact to voice XII: tongue is midline without atrophy or fasciculations.  Motor: No drift in the bilateral lower extremities.  Some drift of the right lower extremity which is also swollen. Sensory: Sensation is symmetric to light touch and temperature in the arms and proximal legs. Cerebellar: Finger-to-nose intact bilaterally, heel-to-shin pain limited Gait:  Able to transfer from wheelchair to scanner, favoring the right leg due to edema/pain/surgery    Labs/Imaging/Neurodiagnostic studies   CBC:  Recent Labs  Lab 2023/05/31 0312 05/30/23 0323  WBC 13.2* 11.0*  HGB 9.3* 9.2*  HCT 27.2* 27.3*  MCV 87.2 88.1  PLT 190 209   Basic Metabolic Panel:  Lab Results  Component Value Date   NA 133 (L) 05/30/2023   K 4.1 05/30/2023   CO2 23 05/30/2023   GLUCOSE 112 (H) 05/30/2023   BUN 6 (L) 05/30/2023   CREATININE 0.51 05/30/2023   CALCIUM 7.8 (L) 05/30/2023   GFRNONAA >60 05/30/2023   GFRAA >60 12/23/2018   Lipid Panel:  Lab Results  Component Value Date   LDLCALC 16 05/28/2023   HgbA1c:  Lab Results  Component Value Date   HGBA1C 5.6 08/28/2012   Urine Drug Screen:     Component Value Date/Time   LABOPIA NONE DETECTED 01/02/2022 0012   COCAINSCRNUR NONE DETECTED 01/02/2022 0012   LABBENZ NONE DETECTED 01/02/2022 0012   AMPHETMU NONE DETECTED 01/02/2022 0012   THCU POSITIVE (A) 01/02/2022 0012   LABBARB POSITIVE (A) 01/02/2022 0012    Alcohol Level     Component Value Date/Time   ETH <10 01/02/2022 0031   INR  Lab Results   Component Value Date   INR 1.0 05/19/2023   APTT  Lab Results  Component Value Date   APTT 27 05/19/2023   AED levels: No results found for: "PHENYTOIN", "ZONISAMIDE", "LAMOTRIGINE", "LEVETIRACETA"  CT Head without contrast(Personally reviewed): 1. No acute intracranial abnormality or significant interval change. 2. Aspects is 10/10. 3. Benign appearing cystic structure just superior to the left lateral ventricle is new since the prior exam. 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 Recommend non emergent MRI of the brain without and with contrast for further evaluation.   ASSESSMENT   Lauren King is a 73 y.o. female  has a past medical history of Anxiety, Chronic lower back pain, COPD (chronic obstructive pulmonary disease) (HCC), Depression, GERD (gastroesophageal reflux disease), Hypertension, Obstructive sleep apnea, and Peripheral vascular disease (HCC).   Differential for her transient symptoms include stroke/TIA or focal seizure  RECOMMENDATIONS  # Transient facial numbness and slurred speech, stroke/TIA versus focal seizure - Stroke labs HgbA1c, fasting lipid panel - MRI brain with and without contrast - CTA or MRA of the brain without contrast and Carotid duplex or MRA neck w/wo  - Frequent neuro checks - Echocardiogram - Continue home DAPT - Adjust statin if needed for goal LDL less than 70 - Goal A1c less than 7% - Risk factor modification - Telemetry monitoring; - Blood pressure goal   - Permissive hypertension to 220/120 due to potential acute stroke; if MRI returns negative for stroke, goal gradual normotension - PT consult, OT consult, Speech consult - Routine EEG - Stroke team to follow  ______________________________________________________________________  Brooke Dare MD-PhD [facility] [phone] Available 7 AM to 7 PM, outside these hours please contact Neurologist on call listed on AMION

## 2023-06-02 NOTE — ED Notes (Signed)
Patient returned from MRI.

## 2023-06-02 NOTE — Telephone Encounter (Signed)
Called pt's daughter to schedule pt appt for leg swelling, per MD staff message. Pt was offered an appt today. Amil Amen, her daughter is unable to bring pt today. Pt scheduled for tomorrow morning and she will call us back if she is able to bring her later today.

## 2023-06-02 NOTE — ED Provider Triage Note (Signed)
Emergency Medicine Provider Triage Evaluation Note  Lauren King , a 73 y.o. female  was evaluated in triage.  Pt complains of  slurred speech and numbness.  Patient had onset of slurred speech "sounding like she was drunk" per her daughter who was bring her to the doctor.  This was at 3:30 PM.  Last known well was just before that.  Patient also now has improvement in her speech but has numbness in the middle of her lip and tongue.  She has no other abnormalities.  She has a recent surgical history of recent left and right femoral endarterectomy critical limb ischemia.  She is on clopidogrel.  Review of Systems  Positive: Acute neurologic deficit in the stroke window Negative: Weakness  Physical Exam  BP (!) 118/92 (BP Location: Right Arm)   Pulse 98   Temp 98 F (36.7 C)   Resp (!) 22   SpO2 (!) 77%  Gen:   Awake, no distress   Resp:  Normal effort  MSK:   Moves extremities without difficulty  Other:    Medical Decision Making  Medically screening exam initiated at 6:20 PM.  Appropriate orders placed.  HOSANNA MOULIN was informed that the remainder of the evaluation will be completed by another provider, this initial triage assessment does not replace that evaluation, and the importance of remaining in the ED until their evaluation is complete.  Code stroke ordered    Arthor Captain, PA-C 06/02/23 1825

## 2023-06-02 NOTE — H&P (Addendum)
PCP:   Aris Everts, MD   Chief Complaint:  Slurred speech  HPI: This is a 73 year old female with past medical history significant for HTN, PAD, HLD, COPD, OSA on CPAP, anxiety and depression.  She was recently admitted 1/17 -1/21 with critical limb ischemia of RLE and aortic iliac occlusive disease.  During that hospitalization she underwent right and left femoral endarterectomy.  She had right femoral below-knee popliteal artery bypass.  Right iliofemoral and profunda femoral thrombectomy and bilateral external iliac artery angioplasty.  She was discharged on aspirin and Plavix daily. She was brought to the ER as a code stroke because patient was noted to have trouble speaking/slurred speech while in the ER.  Per patient she was also repeating herself.  CT head negative.  Patient seen by teleneuro hospitalist.  Admission recommended.  Review of Systems:  Per HPI  Past Medical History: Past Medical History:  Diagnosis Date   Anxiety    Chronic lower back pain    COPD (chronic obstructive pulmonary disease) (HCC)    Depression    GERD (gastroesophageal reflux disease)    Hypertension    Obstructive sleep apnea    CPAP   Peripheral vascular disease (HCC)    Past Surgical History:  Procedure Laterality Date   ABDOMINAL AORTIC ENDOVASCULAR STENT GRAFT N/A 05/27/2023   Procedure: AORTIC STENTING USING ENDOLOGIX GRAFT;  Surgeon: Nada Libman, MD;  Location: [facility];  Service: Vascular;  Laterality: N/A;   ABDOMINAL AORTOGRAM W/LOWER EXTREMITY N/A 09/11/2019   Procedure: ABDOMINAL AORTOGRAM W/LOWER EXTREMITY;  Surgeon: Nada Libman, MD;  Location: [facility];  Service: Cardiovascular;  Laterality: N/A;   ABDOMINAL AORTOGRAM W/LOWER EXTREMITY N/A 10/23/2019   Procedure: ABDOMINAL AORTOGRAM W/LOWER EXTREMITY;  Surgeon: Nada Libman, MD;  Location: [facility];  Service: Cardiovascular;  Laterality: N/A;   ABDOMINAL AORTOGRAM W/LOWER EXTREMITY N/A 02/02/2022    Procedure: ABDOMINAL AORTOGRAM W/LOWER EXTREMITY;  Surgeon: Nada Libman, MD;  Location: [facility];  Service: Cardiovascular;  Laterality: N/A;   ABDOMINAL AORTOGRAM W/LOWER EXTREMITY N/A 03/23/2022   Procedure: ABDOMINAL AORTOGRAM W/LOWER EXTREMITY;  Surgeon: Nada Libman, MD;  [facility]: [facility];  Service: Cardiovascular;  Laterality: N/A;   ABDOMINAL AORTOGRAM W/LOWER EXTREMITY N/A 03/29/2023   Procedure: ABDOMINAL AORTOGRAM W/LOWER EXTREMITY;  Surgeon: Nada Libman, MD;  Location: [facility];  Service: Cardiovascular;  Laterality: N/A;   ABDOMINAL HYSTERECTOMY     BYPASS GRAFT FEMORAL-PERONEAL Right 05/27/2023   Procedure: BYPASS GRAFT RIGHT FEMORAL TO BELOW THE KNEE POPITEAL ARTERY USING NONREVERSED RIGHT GREATER SAPHENOUS VEIN;  Surgeon: Nada Libman, MD;  Location: [facility];  Service: Vascular;  Laterality: Right;   CATARACT EXTRACTION     COLONOSCOPY  01/20/2005   NWG:NFAOZHYQM colon polyp/Diminutive polyps at 30 cm ablated/Normal rectum. hyperplastic polyp   COLONOSCOPY N/A 03/25/2015   VHQ:IONGEX /left-sided diverticula   ENDARTERECTOMY FEMORAL Left 12/22/2018   Procedure: Endarterectomy Left Femoral and Politeal Arteries;  Surgeon: Nada Libman, MD;  Location: [facility] OR;  Service: Vascular;  Laterality: Left;   ENDARTERECTOMY FEMORAL Bilateral 05/27/2023   Procedure: RIGHT FEMORAL ENDARTERECTOMY WITH XENSURE BOVINE PERICARDIUM PATCH AND REDO LEFT FEMORAL ENDARTERECTOMY WITH XENSURE BOVINE PERICARDIUM PATCH;  Surgeon: Nada Libman, MD;  Location: [facility];  Service: Vascular;  Laterality: Bilateral;   ESOPHAGOGASTRODUODENOSCOPY  11/30/2004   RMR:A small patch of salmon-colored epithelium (salmon-like lesion in the distal esophagus)/Antral pre-pyloric erosions of uncertain significance, otherwise, normal. solitary duodenal  bulbar AVM.No Barrett's.   ESOPHAGOGASTRODUODENOSCOPY N/A 03/25/2015   HQI:ONGEXBM reflux/schatzkis ring s/p dilation/HH/short  segment barrett   ESOPHAGOGASTRODUODENOSCOPY (EGD) WITH ESOPHAGEAL DILATION N/A 09/22/2012   Rourk: erosive RE, biopsy negative for Barrett's. Gastritis.    FEMORAL-TIBIAL BYPASS GRAFT Left 12/22/2018   Procedure: BYPASS GRAFT FEMORAL-TIBIAL ARTERY LEFT LEG USING NONREVERSED LEFT GREAT SAPHENOUS VEIN;  Surgeon: Nada Libman, MD;  Location: [facility];  Service: Vascular;  Laterality: Left;   LOWER EXTREMITY ANGIOGRAPHY N/A 12/27/2017   Procedure: LOWER EXTREMITY ANGIOGRAPHY;  Surgeon: Nada Libman, MD;  [facility]: [facility];  Service: Cardiovascular;  Laterality: N/A;   LOWER EXTREMITY ANGIOGRAPHY N/A 11/21/2018   Procedure: LOWER EXTREMITY ANGIOGRAPHY;  Surgeon: Nada Libman, MD;  Location: [facility];  Service: Cardiovascular;  Laterality: N/A;   MALONEY DILATION N/A 03/25/2015   Procedure: Elease Hashimoto DILATION;  Surgeon: Corbin Ade, MD;  [facility]: [facility];  Service: Endoscopy;  Laterality: N/A;   PERIPHERAL VASCULAR BALLOON ANGIOPLASTY  02/02/2022   Procedure: PERIPHERAL VASCULAR BALLOON ANGIOPLASTY;  Surgeon: Nada Libman, MD;  Location: [facility];  Service: Cardiovascular;;   PERIPHERAL VASCULAR INTERVENTION Left 09/11/2019   Procedure: PERIPHERAL VASCULAR INTERVENTION;  Surgeon: Nada Libman, MD;  [facility]: [facility];  Service: Cardiovascular;  Laterality: Left;   PERIPHERAL VASCULAR INTERVENTION Right 10/23/2019   Procedure: PERIPHERAL VASCULAR INTERVENTION;  Surgeon: Nada Libman, MD;  [facility]: [facility];  Service: Cardiovascular;  Laterality: Right;  external iliac   PERIPHERAL VASCULAR INTERVENTION  03/23/2022   Procedure: PERIPHERAL VASCULAR INTERVENTION;  Surgeon: Nada Libman, MD;  Location: [facility];  Service: Cardiovascular;;   PERIPHERAL VASCULAR THROMBECTOMY  03/23/2022   Procedure: PERIPHERAL VASCULAR THROMBECTOMY;  Surgeon: Nada Libman, MD;  [facility]: [facility];  Service: Cardiovascular;;    SKIN CANCER DESTRUCTION     THROMBECTOMY FEMORAL ARTERY Right 05/27/2023   Procedure: THROMBECTOMY OF ILIOFEMORAL ARTERY;  Surgeon: Nada Libman, MD;  Location: [facility] OR;  Service: Vascular;  Laterality: Right;   TUBAL LIGATION     with incidental appendectomy   VEIN HARVEST Left 12/22/2018   Procedure: Vein Harvest Left Great Saphenous;  Surgeon: Nada Libman, MD;  Location: [facility] OR;  Service: Vascular;  Laterality: Left;    Medications: Prior to Admission medications   Medication Sig Start Date End Date Taking? Authorizing Provider  albuterol (VENTOLIN HFA) 108 (90 Base) MCG/ACT inhaler Inhale 1-2 puffs into the lungs every 6 (six) hours as needed for wheezing or shortness of breath.    [provider]  aspirin EC 81 MG tablet Take 81 mg by mouth daily.    [provider]  clopidogrel (PLAVIX) 75 MG tablet Take 1 tablet (75 mg total) by mouth daily. 10/23/20   Chuck Hint, MD  losartan-hydrochlorothiazide (HYZAAR) 50-12.5 MG tablet Take 1 tablet by mouth daily. 09/29/21   [provider]  mirtazapine (REMERON) 15 MG tablet Take 15 mg by mouth at bedtime. 04/04/23   [provider]  Oxycodone HCl 10 MG TABS Take 10 mg by mouth every 4 (four) hours as needed for severe pain. 10/15/21   [provider]  pregabalin (LYRICA) 25 MG capsule Take 1 capsule (25 mg total) by mouth 2 (two) times daily. Patient not taking: Reported on 03/07/2023 07/19/22   Nada Libman, MD  rosuvastatin (CRESTOR) 5 MG tablet Take 1 tablet (5 mg total) by mouth daily. Patient taking differently:  Take 5 mg by mouth at bedtime. 11/13/18   Nada Libman, MD  TRELEGY ELLIPTA 100-62.5-25 MCG/INH AEPB Inhale 1 puff into the lungs daily as needed (Shortness of breath).    [provider]    Allergies:  No Known Allergies  Social History:  reports that she quit smoking about 53 years ago. Her smoking use included cigarettes. She started smoking about 12  months ago. She has a 16 pack-year smoking history. She has never used smokeless tobacco. She reports current alcohol use.  Drug: Marijuana.  Family History: Family History  Problem Relation Age of Onset   Lung cancer Sister    Lung cancer Brother    Colon cancer Neg Hx     Physical Exam: Vitals:   06/02/23 1817 06/02/23 1945 06/02/23 2015 06/02/23 2030  BP: (!) 118/92 (!) 144/80 133/83 131/61  Pulse: 98     Resp: (!) 22 15 18 17   Temp: 98 F (36.7 C)     SpO2: 92% 93%      General:  A&Ox3.  Cachectic malnourished appearing female Eyes: Pink conjunctiva, no scleral icterus ENT: Moist oral mucosa, neck supple, no thyromegaly Lungs: CTA B/L, no use of accessory muscles.  Ribs visible Cardiovascular: RRR, no  murmurs. No carotid bruits, no JVD Abdomen: soft, positive BS, NTND, no organomegaly, not an acute abdomen GU: not examined Neuro: CN II - XII grossly intact, Musculoskeletal: Moves all extremities Skin: Incision sites in B/L groin, 3 incisions down right leg no evidence of infection.  2+ pitting edema, mild erythema on foot RLE Psych: appropriate patient  Labs on Admission:  Recent Labs    06/02/23 1845  NA 129*  K 4.2  CL 96*  CO2 21*  GLUCOSE 156*  BUN 24*  CREATININE 0.93  CALCIUM 9.0   Recent Labs    06/02/23 1845  AST 103*  ALT 148*  ALKPHOS 78  BILITOT 0.6  PROT 6.5  ALBUMIN 3.2*    Recent Labs    06/02/23 1845  WBC 12.6*  NEUTROABS 10.2*  HGB 10.3*  HCT 32.2*  MCV 92.0  PLT 392    Radiological Exams on Admission: CT HEAD CODE STROKE WO CONTRAST Result Date: 06/02/2023 CLINICAL DATA:  Code stroke. Neuro deficit, acute, stroke suspected. Slurred speech. EXAM: CT HEAD WITHOUT CONTRAST TECHNIQUE: Contiguous axial images were obtained from the base of the skull through the vertex without intravenous contrast. RADIATION DOSE REDUCTION: This exam was performed according to the departmental dose-optimization program which includes automated  exposure control, adjustment of the mA and/or kV according to patient size and/or use of iterative reconstruction technique. COMPARISON:  CT head without contrast 12/25/2009 FINDINGS: Brain: No acute infarct, hemorrhage, or mass lesion is present. A benign appearing cystic structure just superior to the left lateral ventricle is new since the prior exam. It measures 12 x 12 x 16 mm. No other focal lesions are present. White matter is otherwise within normal limits. Deep brain nuclei are within normal limits. The ventricles are of normal size. No significant extraaxial fluid collection is present. The brainstem and cerebellum are within normal limits. Midline structures are within normal limits. Vascular: Minimal atherosclerotic calcifications are present within the cavernous internal carotid arteries bilaterally. No hyperdense vessel is present. Skull: Calvarium is intact. No focal lytic or blastic lesions are present. No significant extracranial soft tissue lesion is present. Sinuses/Orbits: The paranasal sinuses and mastoid air cells are clear. Bilateral lens replacements are noted. Globes and orbits are otherwise unremarkable.  ASPECTS [facility] Stroke Program Early CT Score) - Ganglionic level infarction (caudate, lentiform nuclei, internal capsule, insula, M1-M3 cortex): 7/7 - Supraganglionic infarction (M4-M6 cortex): 3/3 Total score (0-10 with 10 being normal): 10/10 IMPRESSION: 1. No acute intracranial abnormality or significant interval change. 2. Aspects is 10/10. 3. Benign appearing cystic structure just superior to the left lateral ventricle is new since the prior exam. 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 Recommend non emergent MRI of the brain without and with contrast for further evaluation. The above was relayed via text pager to Dr. Rollene Fare on 06/02/2023 at 18:43 . Electronically Signed   By: Marin Roberts M.D.   On: 06/02/2023 18:43    Assessment/Plan Present on Admission:  TIA  (transient ischemic attack) -Order set initiated -CTA head and neck, 2D echo, MRI head ordered for a.m. -Neurochecks every 2 hours -Permissive hypertension -PT/OT/speech consult -Lipid panel in a.m. -Continue aspirin and Plavix -EEG ordered   Hyponatremia -IV fluid hydration -BMP in a.m.   Elevated LFTs -Mild new elevations.  For now only CMP in a.m. ordered   S/P vascular surgery //  PAD (peripheral artery disease) (HCC) -Nursing to dress issues and sites. -Defer to AM team neurosurgery consult -Aspirin and Plavix resumed   Malnutrition -Ensure ordered   HTN -Permissive hypertension per order set   HLD -Crestor resumed.  Hold Crestor if LFTs increase further -Lipid panel ordered   COPD -Trelegy resumed   Lauren King 06/02/2023, 8:39 PM

## 2023-06-03 ENCOUNTER — Observation Stay (HOSPITAL_COMMUNITY): Payer: Medicare HMO

## 2023-06-03 ENCOUNTER — Ambulatory Visit: Payer: Medicare HMO

## 2023-06-03 ENCOUNTER — Observation Stay (HOSPITAL_BASED_OUTPATIENT_CLINIC_OR_DEPARTMENT_OTHER): Payer: Medicare HMO

## 2023-06-03 DIAGNOSIS — R609 Edema, unspecified: Secondary | ICD-10-CM | POA: Diagnosis not present

## 2023-06-03 DIAGNOSIS — I639 Cerebral infarction, unspecified: Secondary | ICD-10-CM

## 2023-06-03 DIAGNOSIS — R569 Unspecified convulsions: Secondary | ICD-10-CM

## 2023-06-03 DIAGNOSIS — I10 Essential (primary) hypertension: Secondary | ICD-10-CM

## 2023-06-03 DIAGNOSIS — G459 Transient cerebral ischemic attack, unspecified: Secondary | ICD-10-CM | POA: Diagnosis not present

## 2023-06-03 LAB — COMPREHENSIVE METABOLIC PANEL
ALT: 128 U/L — ABNORMAL HIGH (ref 0–44)
AST: 101 U/L — ABNORMAL HIGH (ref 15–41)
Albumin: 2.7 g/dL — ABNORMAL LOW (ref 3.5–5.0)
Alkaline Phosphatase: 74 U/L (ref 38–126)
Anion gap: 8 (ref 5–15)
BUN: 19 mg/dL (ref 8–23)
CO2: 24 mmol/L (ref 22–32)
Calcium: 8.4 mg/dL — ABNORMAL LOW (ref 8.9–10.3)
Chloride: 99 mmol/L (ref 98–111)
Creatinine, Ser: 0.79 mg/dL (ref 0.44–1.00)
GFR, Estimated: >60 mL/min (ref >60)
Glucose, Bld: 120 mg/dL — ABNORMAL HIGH (ref 70–99)
Potassium: 4.3 mmol/L (ref 3.5–5.1)
Sodium: 131 mmol/L — ABNORMAL LOW (ref 135–145)
Total Bilirubin: 0.9 mg/dL (ref 0.0–1.2)
Total Protein: 5.6 g/dL — ABNORMAL LOW (ref 6.5–8.1)

## 2023-06-03 LAB — LIPID PANEL
Cholesterol: 82 mg/dL (ref 0–200)
HDL: 34 mg/dL — ABNORMAL LOW (ref >40)
LDL Cholesterol: 34 mg/dL (ref 0–99)
Total CHOL/HDL Ratio: 2.4 {ratio}
Triglycerides: 70 mg/dL (ref <150)
VLDL: 14 mg/dL (ref 0–40)

## 2023-06-03 LAB — CBC WITH DIFFERENTIAL/PLATELET
Abs Immature Granulocytes: 0.12 10*3/uL — ABNORMAL HIGH (ref 0.00–0.07)
Basophils Absolute: 0 10*3/uL (ref 0.0–0.1)
Basophils Relative: 0 %
Eosinophils Absolute: 0.3 10*3/uL (ref 0.0–0.5)
Eosinophils Relative: 3 %
HCT: 29.5 % — ABNORMAL LOW (ref 36.0–46.0)
Hemoglobin: 9.5 g/dL — ABNORMAL LOW (ref 12.0–15.0)
Immature Granulocytes: 1 %
Lymphocytes Relative: 10 %
Lymphs Abs: 1.1 10*3/uL (ref 0.7–4.0)
MCH: 29.5 pg (ref 26.0–34.0)
MCHC: 32.2 g/dL (ref 30.0–36.0)
MCV: 91.6 fL (ref 80.0–100.0)
Monocytes Absolute: 1.1 10*3/uL — ABNORMAL HIGH (ref 0.1–1.0)
Monocytes Relative: 11 %
Neutro Abs: 7.6 10*3/uL (ref 1.7–7.7)
Neutrophils Relative %: 75 %
Platelets: 351 10*3/uL (ref 150–400)
RBC: 3.22 MIL/uL — ABNORMAL LOW (ref 3.87–5.11)
RDW: 14.1 % (ref 11.5–15.5)
WBC: 10.3 10*3/uL (ref 4.0–10.5)
nRBC: 0 % (ref 0.0–0.2)

## 2023-06-03 LAB — ECHOCARDIOGRAM COMPLETE
Area-P 1/2: 3.65 cm2
S' Lateral: 2 cm

## 2023-06-03 LAB — I-STAT CHEM 8, ED
BUN: 27 mg/dL — ABNORMAL HIGH (ref 8–23)
Calcium, Ion: 1.15 mmol/L (ref 1.15–1.40)
Chloride: 95 mmol/L — ABNORMAL LOW (ref 98–111)
Creatinine, Ser: 0.9 mg/dL (ref 0.44–1.00)
Glucose, Bld: 156 mg/dL — ABNORMAL HIGH (ref 70–99)
HCT: 32 % — ABNORMAL LOW (ref 36.0–46.0)
Hemoglobin: 10.9 g/dL — ABNORMAL LOW (ref 12.0–15.0)
Potassium: 4.3 mmol/L (ref 3.5–5.1)
Sodium: 128 mmol/L — ABNORMAL LOW (ref 135–145)
TCO2: 23 mmol/L (ref 22–32)

## 2023-06-03 MED ORDER — ASPIRIN 81 MG PO TBEC
81.0000 mg | DELAYED_RELEASE_TABLET | Freq: Every day | ORAL | Status: DC
  Administered 2023-06-03: 81 mg via ORAL
  Filled 2023-06-03: qty 1

## 2023-06-03 MED ORDER — MIRTAZAPINE 15 MG PO TABS: 15.0000 mg | ORAL_TABLET | Freq: Every day | ORAL | Status: DC

## 2023-06-03 MED ORDER — ENSURE ENLIVE PO LIQD: 237.0000 mL | Freq: Two times a day (BID) | ORAL | Status: DC

## 2023-06-03 MED ORDER — ENSURE ENLIVE PO LIQD
237.0000 mL | Freq: Three times a day (TID) | ORAL | Status: DC
Start: 2023-06-03 — End: 2023-06-03
  Administered 2023-06-03: 237 mL via ORAL
  Filled 2023-06-03: qty 237

## 2023-06-03 MED ORDER — OXYCODONE HCL 5 MG PO TABS
10.0000 mg | ORAL_TABLET | ORAL | Status: DC | PRN
  Administered 2023-06-03 (×2): 10 mg via ORAL
  Filled 2023-06-03 (×2): qty 2

## 2023-06-03 MED ORDER — SODIUM CHLORIDE 0.9 % IV BOLUS
500.0000 mL | Freq: Once | INTRAVENOUS | Status: AC
  Administered 2023-06-03: 500 mL via INTRAVENOUS

## 2023-06-03 MED ORDER — CLOPIDOGREL BISULFATE 75 MG PO TABS
75.0000 mg | ORAL_TABLET | Freq: Every day | ORAL | Status: DC
  Administered 2023-06-03: 75 mg via ORAL
  Filled 2023-06-03: qty 1

## 2023-06-03 MED ORDER — ROSUVASTATIN CALCIUM 20 MG PO TABS: 20.0000 mg | ORAL_TABLET | Freq: Every day | ORAL | Status: DC

## 2023-06-03 NOTE — Care Management Obs Status (Signed)
MEDICARE OBSERVATION STATUS NOTIFICATION   Patient Details  Name: Lauren King MRN: [id number] Date of Birth: 06-08-1950   Medicare Observation Status Notification Given:  Yes    Oletta Cohn, RN 06/03/2023, 2:47 PM

## 2023-06-03 NOTE — Progress Notes (Addendum)
STROKE TEAM PROGRESS NOTE   BRIEF HPI Ms. Lauren King is a 73 y.o. female with history of  critical limb ischemia of the right lower extremity s/p vascular procedure (aortobiiliac stent 05/27/2023, right femoral endarterectomy with patch angioplasty, right iliofemoral and profunda femoral thrombectomy, left femoral endarterectomy with patch, angioplasty of the bilateral external iliac arteries, right fem to below-knee popliteal artery bypass graft) on dual antiplatelet therapy, hypertension, hyperlipidemia, COPD, former smoking,  presenting with slurred speech and unilateral tingling of the face and tongue.   NIH on Admission 2   SIGNIFICANT HOSPITAL EVENTS 1/24 MRI brain negative.  EEG with intermittent slowing  INTERIM HISTORY/SUBJECTIVE  No family at the bedside.  Therapy is at the bedside working with her.  Patient states that she developed acute onset of bottom lip numbness and some slurred speech as well as some trouble getting words out over that all resolved prior to arriving at the hospital Routine EEG was obtained and revealed intermittent slowing no seizures identified She is on home aspirin and Plavix, can consider adding Pletal as well Sodium is 129, UA is borderline, LDL 34 A1c 5.8 Echo pending as well as ultrasound of lower extremities OBJECTIVE  CBC    Component Value Date/Time   WBC 10.3 06/03/2023 0538   RBC 3.22 (L) 06/03/2023 0538   HGB 9.5 (L) 06/03/2023 0538   HGB 12.3 08/28/2012 0000   HCT 29.5 (L) 06/03/2023 0538   HCT 36 08/28/2012 0000   PLT 351 06/03/2023 0538   MCV 91.6 06/03/2023 0538   MCV 92.4 08/28/2012 0000   MCH 29.5 06/03/2023 0538   MCHC 32.2 06/03/2023 0538   RDW 14.1 06/03/2023 0538   LYMPHSABS 1.1 06/03/2023 0538   MONOABS 1.1 (H) 06/03/2023 0538   EOSABS 0.3 06/03/2023 0538   BASOSABS 0.0 06/03/2023 0538    BMET    Component Value Date/Time   NA 131 (L) 06/03/2023 0538   K 4.3 06/03/2023 0538   CL 99 06/03/2023 0538   CO2 24  06/03/2023 0538   GLUCOSE 120 (H) 06/03/2023 0538   BUN 19 06/03/2023 0538   CREATININE 0.79 06/03/2023 0538   CREATININE 0.62 08/28/2012 0000   CALCIUM 8.4 (L) 06/03/2023 0538   CALCIUM 8.5 08/28/2012 0000   GFRNONAA >60 06/03/2023 0538    IMAGING past 24 hours EEG adult Result Date: 06/03/2023 Charlsie Quest, MD     06/03/2023  8:29 AM Patient Name: Lauren King MRN: [id number] Epilepsy Attending: Charlsie Quest Referring Physician/Provider: Gordy Councilman, MD Date: 06/03/2023 Duration: 24.29 mins Patient history:  73yo F with transient facial numbness and slurred speech. EEG to evaluate for seizure Level of alertness: Awake, asleep AEDs during EEG study: None Technical aspects: This EEG study was done with scalp electrodes positioned according to the 10-20 International system of electrode placement. Electrical activity was reviewed with band pass filter of 1-70Hz , sensitivity of 7 uV/mm, display speed of 20mm/sec with a 60Hz  notched filter applied as appropriate. EEG data were recorded continuously and digitally stored.  Video monitoring was available and reviewed as appropriate. Description: The posterior dominant rhythm consists of 8 Hz activity of moderate voltage (25-35 uV) seen predominantly in posterior head regions, symmetric and reactive to eye opening and eye closing. Sleep was characterized by vertex waves, sleep spindles (12 to 14 Hz), maximal frontocentral region. EEG showed intermittent generalized 3 to 6 Hz theta-delta slowing. Hyperventilation and photic stimulation were not performed.   ABNORMALITY - Intermittent slow, generalized IMPRESSION:  This study is suggestive of mild diffuse encephalopathy. No seizures or epileptiform discharges were seen throughout the recording. Charlsie Quest   CT ANGIO HEAD NECK W WO CM Result Date: 06/02/2023 CLINICAL DATA:  Initial evaluation for stroke/TIA. EXAM: CT ANGIOGRAPHY HEAD AND NECK WITH AND WITHOUT CONTRAST TECHNIQUE:  Multidetector CT imaging of the head and neck was performed using the standard protocol during bolus administration of intravenous contrast. Multiplanar CT image reconstructions and MIPs were obtained to evaluate the vascular anatomy. Carotid stenosis measurements (when applicable) are obtained utilizing NASCET criteria, using the distal internal carotid diameter as the denominator. RADIATION DOSE REDUCTION: This exam was performed according to the departmental dose-optimization program which includes automated exposure control, adjustment of the mA and/or kV according to patient size and/or use of iterative reconstruction technique. CONTRAST:  75mL OMNIPAQUE IOHEXOL 350 MG/ML SOLN COMPARISON:  CT and MRI from earlier the same day. FINDINGS: CT HEAD FINDINGS Brain: Stable head CT. No acute intracranial hemorrhage. No acute large vessel territory infarct. 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 No other mass lesion or midline shift. No hydrocephalus or extra-axial fluid collection. Vascular: No abnormal hyperdense vessel. Skull: Scalp soft tissues demonstrate no acute finding. Calvarium intact. Sinuses/Orbits: Globes orbital soft tissues within normal limits. Paranasal sinuses and mastoid air cells are largely clear. Other: None Review of the MIP images confirms the above findings CTA NECK FINDINGS Aortic arch: Visualized aortic arch within normal limits for caliber with standard 3 vessel morphology. Moderate aortic atherosclerosis. No significant stenosis about the origin the great vessels. Right carotid system: Right common and internal carotid arteries are patent without dissection. Moderate atheromatous change without hemodynamically significant greater than 50% stenosis. Left carotid system: Left common and internal carotid arteries are patent without dissection. Moderate atheromatous change without hemodynamically significant greater than 50% stenosis. Vertebral arteries:  Vertebral arteries are patent without hemodynamically significant stenosis or dissection. Skeleton: No discrete or worrisome osseous lesions. Moderate spondylosis at C4-5 through C6-7. Other neck: No other acute finding. Upper chest: Severe emphysema. Review of the MIP images confirms the above findings CTA HEAD FINDINGS Anterior circulation: Atheromatous change about the carotid siphons without hemodynamically significant stenosis. A1 segments, anterior communicating artery complex, and anterior cerebral arteries patent without stenosis. No M1 stenosis or occlusion. Distal MCA branches perfused and symmetric. Posterior circulation: Both V4 segments patent without hemodynamically significant stenosis. Few tiny 1-2 mm outpouching seen arising from the right V4 segment (series 5, images 143, 136, 132), possibly reflecting small penetrating plaques versus tiny aneurysms. Both PICA patent. Basilar widely patent without stenosis. Superior cerebellar and posterior cerebral arteries patent bilaterally. Venous sinuses: Patent allowing for timing the contrast bolus. Anatomic variants: None significant. Review of the MIP images confirms the above findings IMPRESSION: CT HEAD: Stable head CT.  No other new acute intracranial abnormality. CTA HEAD AND NECK: 1. Negative CTA for large vessel occlusion or other emergent finding. 2. Moderate atheromatous change about the carotid bifurcations and carotid siphons without hemodynamically significant stenosis. 3. Few tiny 1-2 mm outpouchings arising from the right V4 segment, possibly reflecting small penetrating plaques versus tiny aneurysms. 4. Aortic Atherosclerosis (ICD10-I70.0) and Emphysema (ICD10-J43.9). Electronically Signed   By: Rise Mu M.D.   On: 06/02/2023 23:45   MR BRAIN WO CONTRAST Result Date: 06/02/2023 CLINICAL DATA:  Initial evaluation for acute TIA. EXAM: MRI HEAD WITHOUT CONTRAST TECHNIQUE: Multiplanar, multiecho pulse sequences of the brain and  surrounding structures were obtained without intravenous contrast. COMPARISON:  Prior CT from earlier the same day. FINDINGS: Brain: Cerebral volume within normal limits. Scattered patchy T2/FLAIR hyperintensity involving the supratentorial cerebral white matter, nonspecific, but favored to reflect changes of chronic microvascular ischemic disease. Mild patchy involvement of the pons. Overall, appearance is mild in nature. No evidence for acute or subacute infarct. Gray-white matter digestion maintained. No areas of chronic cortical infarction. No acute or chronic intracranial blood products. Well-circumscribed ovoid cystic lesion measuring 1.5 cm seen involving the deep white matter of the posterior left centrum semi ovale (series 11, image 35). Minimal internal complexity with a few small internal septations on T2 weighted sequence. No to surrounding edema or parenchymal changes. 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 No other mass lesion, mass effect or midline shift. No hydrocephalus or extra-axial fluid collection. Pituitary gland suprasellar region within normal limits. Vascular: Major intracranial vascular flow voids are maintained. Skull and upper cervical spine: Craniocervical junction within normal limits. Bone marrow signal intensity normal. No scalp soft tissue abnormality. Sinuses/Orbits: Prior bilateral ocular lens replacement. Paranasal sinuses are largely clear. Trace bilateral mastoid effusions, of doubtful significance. Other: None. IMPRESSION: 1. No acute intracranial abnormality. 2. 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 Follow-up examination with nonemergent postcontrast imaging recommended for complete evaluation. 3. Mild cerebral white matter disease, most likely related to chronic microvascular ischemic disease. Electronically Signed   By: Rise Mu M.D.   On:  06/02/2023 21:57   CT HEAD CODE STROKE WO CONTRAST Result Date: 06/02/2023 CLINICAL DATA:  Code stroke. Neuro deficit, acute, stroke suspected. Slurred speech. EXAM: CT HEAD WITHOUT CONTRAST TECHNIQUE: Contiguous axial images were obtained from the base of the skull through the vertex without intravenous contrast. RADIATION DOSE REDUCTION: This exam was performed according to the departmental dose-optimization program which includes automated exposure control, adjustment of the mA and/or kV according to patient size and/or use of iterative reconstruction technique. COMPARISON:  CT head without contrast 12/25/2009 FINDINGS: Brain: No acute infarct, hemorrhage, or mass lesion is present. A benign appearing cystic structure just superior to the left lateral ventricle is new since the prior exam. It measures 12 x 12 x 16 mm. No other focal lesions are present. White matter is otherwise within normal limits. Deep brain nuclei are within normal limits. The ventricles are of normal size. No significant extraaxial fluid collection is present. The brainstem and cerebellum are within normal limits. Midline structures are within normal limits. Vascular: Minimal atherosclerotic calcifications are present within the cavernous internal carotid arteries bilaterally. No hyperdense vessel is present. Skull: Calvarium is intact. No focal lytic or blastic lesions are present. No significant extracranial soft tissue lesion is present. Sinuses/Orbits: The paranasal sinuses and mastoid air cells are clear. Bilateral lens replacements are noted. Globes and orbits are otherwise unremarkable. ASPECTS [facility] Stroke Program Early CT Score) - Ganglionic level infarction (caudate, lentiform nuclei, internal capsule, insula, M1-M3 cortex): 7/7 - Supraganglionic infarction (M4-M6 cortex): 3/3 Total score (0-10 with 10 being normal): 10/10 IMPRESSION: 1. No acute intracranial abnormality or significant interval change. 2. Aspects is 10/10. 3.  Benign appearing cystic structure just superior to the left lateral ventricle is new since the prior exam. 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 Recommend non emergent MRI of the brain without and with contrast for further evaluation. The above was relayed via text pager to Dr. Rollene Fare on 06/02/2023 at 18:43 . Electronically Signed  By: Marin Roberts M.D.   On: 06/02/2023 18:43    Vitals:   06/03/23 0700 06/03/23 0915 06/03/23 0917 06/03/23 1412  BP:  119/66 119/66   Pulse:  95 84   Resp:  (!) 25 11   Temp: 97.9 F (36.6 C)  97.9 F (36.6 C) 97.9 F (36.6 C)  TempSrc: Oral  Oral Oral  SpO2:  94% 96%      PHYSICAL EXAM General:  Alert, well-nourished, well-developed patient in no acute distress Psych:  Mood and affect appropriate for situation CV: Regular rate and rhythm on monitor Respiratory:  Regular, unlabored respirations on room air GI: Abdomen soft and nontender Extremities-right knee and inner thigh with incision with erythema  NEURO:  Mental Status: AA&Ox3, patient is able to give clear and coherent history Speech/Language: speech is without dysarthria or aphasia.  Naming, repetition, fluency, and comprehension intact.  Cranial Nerves:  II: PERRL. Visual fields full.  III, IV, VI: EOMI. Eyelids elevate symmetrically.  V: Sensation is intact to light touch and symmetrical to face.  VII: Face is symmetrical resting and smiling VIII: hearing intact to voice. IX, X: Palate elevates symmetrically. Phonation is normal.  [id number] shrug 5/5. XII: tongue is midline without fasciculations. Motor: 5/5 strength to in bilateral uppers and left lower.  Right lower with slight drift however has had recent procedure done Tone: is normal and bulk is normal Sensation- Intact to light touch bilaterally. Extinction absent to light touch to DSS.   Coordination: FTN intact bilaterally, HKS: no ataxia in BLE.No drift.  Gait- deferred  Most Recent NIH  1a Level of Conscious.:  0 1b LOC Questions: 0 1c LOC Commands: 0 2 Best Gaze: 0 3 Visual: 0 4 Facial Palsy: 0 5a Motor Arm - left: 0 5b Motor Arm - Right: 0 6a Motor Leg - Left: 0 6b Motor Leg - Right: 1 7 Limb Ataxia: 0 8 Sensory: 0 9 Best Language: 0 10 Dysarthria: 0 11 Extinct. and Inatten.: 0 TOTAL: 1   ASSESSMENT/PLAN  TIA: Likely small vessel disease Code Stroke CT head No acute abnormality. ASPECTS 10.    CTA head & neck no LVO. Few tiny 1-2 mm outpouchings arising from the right V4 segment, possibly reflecting small penetrating plaques versus tiny aneurysms. MRI no acute process Doppler lower extremity preliminary-no DVT 2D Echo EF 60 to 65%.  Left atrial size normal. EEG This study is suggestive of mild diffuse encephalopathy. No seizures or epileptiform discharges were seen throughout the recording  LDL 34 HgbA1c 5.8 VTE prophylaxis -SCDs aspirin 81 mg daily and clopidogrel 75 mg daily prior to admission, now on aspirin 81 mg daily and clopidogrel 75 mg daily Therapy recommendations:  Pending Disposition: Pending  Hypertension Home meds: Losartan-HCTZ 50-12.5 mg, Stable Blood Pressure Goal: BP less than 220/110   Hyperlipidemia Home meds: Crestor 20 mg, resumed in hospital LDL 34, goal < 70 Continue statin at discharge   Substance Abuse Patient uses opiates UDS positive for opiates History of THC and barbiturate use TOC consult for cessation placed  Dysphagia Patient has post-stroke dysphagia, SLP consulted    Diet   Diet Heart Room service appropriate? Yes; Fluid consistency: Thin   Advance diet as tolerated  Other Stroke Risk Factors Obstructive sleep apnea, on CPAP at home  Other Active Problems Hyponatremia PAD/PVD COPD GERD Anxiety and depression  Hospital day # 0  Gevena Mart DNP, ACNPC-AG  [facility]  STROKE MD NOTE :  I have personally obtained history,examined this  patient, reviewed notes, independently viewed imaging studies,  participated in medical decision making and plan of care.ROS completed by me personally and pertinent positives fully documented  I have made any additions or clarifications directly to the above note. Agree with note above.  She presented with sudden onset of slurred speech and lip paresthesias likely from TIA due to small vessel disease.  Neurological exam is nonfocal.  MRI scan shows no acute abnormality and CT angiogram shows no large vessel stenosis or occlusion.  Recommend aspirin and Plavix along with Pletal given history of significant peripheral arterial disease for stroke prevention    Greater than 50% time during this 50-minute visit with spent on counseling and coordination of care about her TIA and discussion about stroke evaluation and prevention and treatment and answering questions.  Discussed with Dr. Allena Katz Stroke team will sign off.  Kindly call for questions. Delia Heady, MD Medical Director [facility] Pager: [phone] 06/03/2023 3:41 PM    To contact Stroke Continuity provider, please refer to [email or web address]. After hours, contact General Neurology

## 2023-06-03 NOTE — Evaluation (Signed)
Physical Therapy Evaluation Patient Details Name: Lauren King MRN: [id number] DOB: 1950-09-24 Today's Date: 06/03/2023  History of Present Illness  Pt is a 73 y/o female with pain in R LE at rest admitted 1/17 for revascularization, s/p multiple areas with angioplasty, stenting and endarterectomies plus R femoral to below knee popliteal art. BPGing.  PMHx:  COPD, HTN, PVD, OSA, anxiety/depression.   Clinical Impression  Lauren King is 73 y.o. female admitted with above HPI and diagnosis. Patient is currently limited by functional impairments below (see PT problem list). Patient lives alone but her daughter is going to move in with her; pt is independent at baseline. Patient evaluated by Physical Therapy with no further acute PT needs identified as she is mobilizing at mod ind level to supervision for gait of ~200' with no AD. All education has been completed and the patient has no further questions. See below for any follow-up Physical Therapy or equipment needs. PT is signing off. Thank you for this referral.         If plan is discharge home, recommend the following: A little help with walking and/or transfers;A little help with bathing/dressing/bathroom;Assistance with cooking/housework;Assist for transportation;Help with stairs or ramp for entrance   Can travel by private vehicle        Equipment Recommendations None recommended by PT  Recommendations for Other Services       Functional Status Assessment Patient has had a recent decline in their functional status and demonstrates the ability to make significant improvements in function in a reasonable and predictable amount of time.     Precautions / Restrictions Precautions Precautions: Fall Restrictions Weight Bearing Restrictions Per Provider Order: No      Mobility  Bed Mobility Overal bed mobility: Modified Independent             General bed mobility comments: extra time for bed mob    Transfers Overall  transfer level: Modified independent Equipment used: Rolling walker (2 wheels), None Transfers: Sit to/from Stand, Bed to chair/wheelchair/BSC Sit to Stand: Modified independent (Device/Increase time)   Step pivot transfers: Modified independent (Device/Increase time)       General transfer comment: use of hand for rise/lower    Ambulation/Gait Ambulation/Gait assistance: Supervision Gait Distance (Feet): 200 Feet Assistive device: Rolling walker (2 wheels), None Gait Pattern/deviations: Step-through pattern, Decreased stride length, Antalgic Gait velocity: decr     General Gait Details: mildly antalgic with slight sway. no LOB and pt stable with and without UE support.  Stairs            Wheelchair Mobility     Tilt Bed    Modified Rankin (Stroke Patients Only)       Balance Overall balance assessment: Needs assistance Sitting-balance support: No upper extremity supported, Feet supported Sitting balance-Leahy Scale: Normal     Standing balance support: No upper extremity supported, Bilateral upper extremity supported, During functional activity Standing balance-Leahy Scale: Good Standing balance comment: stead without RW                             Pertinent Vitals/Pain Pain Assessment Pain Assessment: Faces Faces Pain Scale: Hurts little more Pain Location: R leg/knee/incisions Pain Descriptors / Indicators: Tender, Sore Pain Intervention(s): Monitored during session, Limited activity within patient's tolerance, Premedicated before session, Repositioned    Home Living Family/patient expects to be discharged to:: Private residence Living Arrangements: Children;Other relatives Available Help at Discharge: Family;Available 24 hours/day;Available  PRN/intermittently Type of Home: Apartment Home Access: Elevator;Stairs to enter Entrance Stairs-Rails: Right;Left Entrance Stairs-Number of Steps: flight   Home Layout: One level Home Equipment:  Agricultural consultant (2 wheels);Grab bars - tub/shower;Wheelchair - manual      Prior Function Prior Level of Function : Independent/Modified Independent             Mobility Comments: Independent without an AD prior to surgery, using 2WRW post but has been amb without RW for several days ADLs Comments: Independent with ADLs and IADLs; has a driver's license but typically doesn't drive; family assist with transportation and IADLs PRN. Post surgery daughter does assist with LB ADL.     Extremity/Trunk Assessment   Upper Extremity Assessment Upper Extremity Assessment: Overall WFL for tasks assessed    Lower Extremity Assessment Lower Extremity Assessment: Defer to PT evaluation    Cervical / Trunk Assessment Cervical / Trunk Assessment: Normal  Communication   Communication Communication: No apparent difficulties  Cognition Arousal: Alert Behavior During Therapy: WFL for tasks assessed/performed Overall Cognitive Status: Within Functional Limits for tasks assessed                                          General Comments      Exercises     Assessment/Plan    PT Assessment Patient does not need any further PT services  PT Problem List Decreased mobility       PT Treatment Interventions DME instruction;Gait training;Stair training;Functional mobility training;Therapeutic activities;Patient/family education    PT Goals (Current goals can be found in the Care Plan section)  Acute Rehab PT Goals Patient Stated Goal: independent at home,  less leg pain PT Goal Formulation: With patient Time For Goal Achievement: 06/17/23 Potential to Achieve Goals: Good    Frequency Min 1X/week     Co-evaluation               AM-PAC PT "6 Clicks" Mobility  Outcome Measure Help needed turning from your back to your side while in a flat bed without using bedrails?: None Help needed moving from lying on your back to sitting on the side of a flat bed without  using bedrails?: None Help needed moving to and from a bed to a chair (including a wheelchair)?: None Help needed standing up from a chair using your arms (e.g., wheelchair or bedside chair)?: None Help needed to walk in hospital room?: A Little Help needed climbing 3-5 steps with a railing? : A Little 6 Click Score: 22    End of Session Equipment Utilized During Treatment: Gait belt Activity Tolerance: Patient tolerated treatment well Patient left: in chair;with call bell/phone within reach (in personal WC (locked)) Nurse Communication: Mobility status PT Visit Diagnosis: Other abnormalities of gait and mobility (R26.89);Difficulty in walking, not elsewhere classified (R26.2);Pain Pain - Right/Left: Right (Lt groin) Pain - part of body: Leg    Time: 1610-9604 PT Time Calculation (min) (ACUTE ONLY): 19 min   Charges:   PT Evaluation $PT Eval Low Complexity: 1 Low   PT General Charges $$ ACUTE PT VISIT: 1 Visit         Wynn Maudlin, DPT Acute Rehabilitation Services Office [phone]  06/03/23 1:03 PM

## 2023-06-03 NOTE — Progress Notes (Signed)
Lower extremity venous duplex completed. Please see CV Procedures for preliminary results.  Shona Simpson, RVT 06/03/23 4:27 PM

## 2023-06-03 NOTE — Progress Notes (Signed)
EEG complete - results pending

## 2023-06-03 NOTE — Consult Note (Cosign Needed)
Hospital Consult    Reason for Consult:  left groin incision drainage Requesting Physician:  Lynden Oxford MD MRN #:  [id number]  History of Present Illness: Lauren King is a 73 y.o. female who presented to the ED yesterday for an incision check. Upon presentation she was found to have slurred speech and tongue numbness, LKW 3:15PM. Code stroke was initiated and imaging was obtained. Current thoughts are that the patient had a TIA, however she is being admitted for further workup.  She is well known to our practice. She was discharged 4 days ago after extensive surgery on 1/17 including: aortobiiliac stenting, bilateral femoral endarterectomies with bovine patch plasty, and right femoral to below knee popliteal bypass with ipsilateral GSV.   She presented to the ED yesterday because her daughter was concerned about her left groin incision. The patient says her daughter is a CNA and has been taking care of her incisions at home. She notes about 3 days ago a small area of her left groin incision opened up. Since then she has had mild serous drainage. She says the drainage will increase a bit shortly after mobilizing. She denies any pus like drainage, fevers, or erythema. She denies any tenderness. She denies any lower extremity pain or numbness.   She has had some right leg and foot swelling since revascularization. She says this is slowly getting better.  Past Medical History:  Diagnosis Date   Anxiety    Chronic lower back pain    COPD (chronic obstructive pulmonary disease) (HCC)    Depression    GERD (gastroesophageal reflux disease)    Hypertension    Obstructive sleep apnea    CPAP   Peripheral vascular disease (HCC)     Past Surgical History:  Procedure Laterality Date   ABDOMINAL AORTIC ENDOVASCULAR STENT GRAFT N/A 05/27/2023   Procedure: AORTIC STENTING USING ENDOLOGIX GRAFT;  Surgeon: Nada Libman, MD;  Location: [facility] OR;  Service: Vascular;  Laterality: N/A;   ABDOMINAL  AORTOGRAM W/LOWER EXTREMITY N/A 09/11/2019   Procedure: ABDOMINAL AORTOGRAM W/LOWER EXTREMITY;  Surgeon: Nada Libman, MD;  Location: [facility];  Service: Cardiovascular;  Laterality: N/A;   ABDOMINAL AORTOGRAM W/LOWER EXTREMITY N/A 10/23/2019   Procedure: ABDOMINAL AORTOGRAM W/LOWER EXTREMITY;  Surgeon: Nada Libman, MD;  Location: [facility];  Service: Cardiovascular;  Laterality: N/A;   ABDOMINAL AORTOGRAM W/LOWER EXTREMITY N/A 02/02/2022   Procedure: ABDOMINAL AORTOGRAM W/LOWER EXTREMITY;  Surgeon: Nada Libman, MD;  Location: [facility];  Service: Cardiovascular;  Laterality: N/A;   ABDOMINAL AORTOGRAM W/LOWER EXTREMITY N/A 03/23/2022   Procedure: ABDOMINAL AORTOGRAM W/LOWER EXTREMITY;  Surgeon: Nada Libman, MD;  Location: [facility];  Service: Cardiovascular;  Laterality: N/A;   ABDOMINAL AORTOGRAM W/LOWER EXTREMITY N/A 03/29/2023   Procedure: ABDOMINAL AORTOGRAM W/LOWER EXTREMITY;  Surgeon: Nada Libman, MD;  Location: [facility];  Service: Cardiovascular;  Laterality: N/A;   ABDOMINAL HYSTERECTOMY     BYPASS GRAFT FEMORAL-PERONEAL Right 05/27/2023   Procedure: BYPASS GRAFT RIGHT FEMORAL TO BELOW THE KNEE POPITEAL ARTERY USING NONREVERSED RIGHT GREATER SAPHENOUS VEIN;  Surgeon: Nada Libman, MD;  Location: [facility] OR;  Service: Vascular;  Laterality: Right;   CATARACT EXTRACTION     COLONOSCOPY  01/20/2005   WUJ:WJXBJYNWG colon polyp/Diminutive polyps at 30 cm ablated/Normal rectum. hyperplastic polyp   COLONOSCOPY N/A 03/25/2015   NFA:OZHYQM /left-sided diverticula   ENDARTERECTOMY FEMORAL Left 12/22/2018   Procedure: Endarterectomy Left Femoral and Politeal Arteries;  Surgeon: Nada Libman, MD;  Location: [facility] OR;  Service: Vascular;  Laterality: Left;   ENDARTERECTOMY FEMORAL Bilateral 05/27/2023   Procedure: RIGHT FEMORAL ENDARTERECTOMY WITH XENSURE BOVINE PERICARDIUM PATCH AND REDO LEFT FEMORAL ENDARTERECTOMY WITH XENSURE BOVINE  PERICARDIUM PATCH;  Surgeon: Nada Libman, MD;  Location: [facility] OR;  Service: Vascular;  Laterality: Bilateral;   ESOPHAGOGASTRODUODENOSCOPY  11/30/2004   RMR:A small patch of salmon-colored epithelium (salmon-like lesion in the distal esophagus)/Antral pre-pyloric erosions of uncertain significance, otherwise, normal. solitary duodenal bulbar AVM.No Barrett's.   ESOPHAGOGASTRODUODENOSCOPY N/A 03/25/2015   JYN:WGNFAOZ reflux/schatzkis ring s/p dilation/HH/short segment barrett   ESOPHAGOGASTRODUODENOSCOPY (EGD) WITH ESOPHAGEAL DILATION N/A 09/22/2012   Rourk: erosive RE, biopsy negative for Barrett's. Gastritis.    FEMORAL-TIBIAL BYPASS GRAFT Left 12/22/2018   Procedure: BYPASS GRAFT FEMORAL-TIBIAL ARTERY LEFT LEG USING NONREVERSED LEFT GREAT SAPHENOUS VEIN;  Surgeon: Nada Libman, MD;  Location: [facility] OR;  Service: Vascular;  Laterality: Left;   LOWER EXTREMITY ANGIOGRAPHY N/A 12/27/2017   Procedure: LOWER EXTREMITY ANGIOGRAPHY;  Surgeon: Nada Libman, MD;  [facility]: [facility];  Service: Cardiovascular;  Laterality: N/A;   LOWER EXTREMITY ANGIOGRAPHY N/A 11/21/2018   Procedure: LOWER EXTREMITY ANGIOGRAPHY;  Surgeon: Nada Libman, MD;  [facility]: [facility];  Service: Cardiovascular;  Laterality: N/A;   MALONEY DILATION N/A 03/25/2015   Procedure: Elease Hashimoto DILATION;  Surgeon: Corbin Ade, MD;  Location: [facility];  Service: Endoscopy;  Laterality: N/A;   PERIPHERAL VASCULAR BALLOON ANGIOPLASTY  02/02/2022   Procedure: PERIPHERAL VASCULAR BALLOON ANGIOPLASTY;  Surgeon: Nada Libman, MD;  [facility]: [facility];  Service: Cardiovascular;;   PERIPHERAL VASCULAR INTERVENTION Left 09/11/2019   Procedure: PERIPHERAL VASCULAR INTERVENTION;  Surgeon: Nada Libman, MD;  Location: [facility];  Service: Cardiovascular;  Laterality: Left;   PERIPHERAL VASCULAR INTERVENTION Right 10/23/2019   Procedure: PERIPHERAL VASCULAR INTERVENTION;  Surgeon: Nada Libman,  MD;  [facility]: [facility];  Service: Cardiovascular;  Laterality: Right;  external iliac   PERIPHERAL VASCULAR INTERVENTION  03/23/2022   Procedure: PERIPHERAL VASCULAR INTERVENTION;  Surgeon: Nada Libman, MD;  Location: [facility];  Service: Cardiovascular;;   PERIPHERAL VASCULAR THROMBECTOMY  03/23/2022   Procedure: PERIPHERAL VASCULAR THROMBECTOMY;  Surgeon: Nada Libman, MD;  [facility]: [facility];  Service: Cardiovascular;;   SKIN CANCER DESTRUCTION     THROMBECTOMY FEMORAL ARTERY Right 05/27/2023   Procedure: THROMBECTOMY OF ILIOFEMORAL ARTERY;  Surgeon: Nada Libman, MD;  Location: [facility] OR;  Service: Vascular;  Laterality: Right;   TUBAL LIGATION     with incidental appendectomy   VEIN HARVEST Left 12/22/2018   Procedure: Vein Harvest Left Great Saphenous;  Surgeon: Nada Libman, MD;  Location: [facility] OR;  Service: Vascular;  Laterality: Left;    No Known Allergies  Prior to Admission medications   Medication Sig Start Date End Date Taking? Authorizing Provider  albuterol (VENTOLIN HFA) 108 (90 Base) MCG/ACT inhaler Inhale 1-2 puffs into the lungs every 6 (six) hours as needed for wheezing or shortness of breath.   Yes [provider]  aspirin EC 81 MG tablet Take 81 mg by mouth daily.   Yes [provider]  clopidogrel (PLAVIX) 75 MG tablet Take 1 tablet (75 mg total) by mouth daily. 10/23/20  Yes Chuck Hint, MD  losartan-hydrochlorothiazide (HYZAAR) 50-12.5 MG tablet Take 1 tablet by mouth daily. 09/29/21  Yes [provider]  mirtazapine (REMERON) 15 MG  tablet Take 15 mg by mouth at bedtime. 04/04/23  Yes [provider]  Oxycodone HCl 10 MG TABS Take 10 mg by mouth every 4 (four) hours as needed for severe pain. 10/15/21  Yes [provider]  rosuvastatin (CRESTOR) 20 MG tablet Take 20 mg by mouth at bedtime.   Yes [provider]  TRELEGY ELLIPTA 100-62.5-25 MCG/INH AEPB Inhale 1 puff  into the lungs daily as needed (Shortness of breath).   Yes [provider]    Social History   Socioeconomic History   Marital status: Divorced    Spouse name: Not on file   Number of children: 2   Years of education: Not on file   Highest education level: Not on file  Occupational History   Not on file  Tobacco Use   Smoking status: Former    Average packs/day: 0.4 packs/day for 40.0 years (16.0 ttl pk-yrs)    Types: Cigarettes    Start date: 05/11/2022    Quit date: 11    Years since quitting: 53.1   Smokeless tobacco: Never  Vaping Use   Vaping status: Some Days   Substances: Nicotine  Substance and Sexual Activity   Alcohol use: Yes    Comment: maybe 1 drink every couple of months   Drug use: Not on file    Comment: smokes maybe 3 times a week (says MD is aware)   Sexual activity: Not on file  Other Topics Concern   Not on file  Social History Narrative   Pt has had 5 children pass away. 2 living children   Social Drivers of Corporate investment banker Strain: Not on file  Food Insecurity: No Food Insecurity (05/28/2023)   Hunger Vital Sign    Worried About Running Out of Food in the Last Year: Never true    Ran Out of Food in the Last Year: Never true  Transportation Needs: No Transportation Needs (05/28/2023)   PRAPARE - Administrator, Civil Service (Medical): No    Lack of Transportation (Non-Medical): No  Physical Activity: Not on file  Stress: Not on file  Social Connections: Patient Declined (05/28/2023)   Social Connection and Isolation Panel [NHANES]    Frequency of Communication with Friends and Family: Patient declined    Frequency of Social Gatherings with Friends and Family: Patient declined    Attends Religious Services: Patient declined    Database administrator or Organizations: Patient declined    Attends Banker Meetings: Patient declined    Marital Status: Patient declined  Intimate Partner Violence: Not At  Risk (05/28/2023)   Humiliation, Afraid, Rape, and Kick questionnaire    Fear of Current or Ex-Partner: No    Emotionally Abused: No    Physically Abused: No    Sexually Abused: No     Family History  Problem Relation Age of Onset   Lung cancer Sister    Lung cancer Brother    Colon cancer Neg Hx     ROS: Otherwise negative unless mentioned in HPI  Physical Examination  Vitals:   06/03/23 0700 06/03/23 0917  BP:  119/66  Pulse:  84  Resp:  11  Temp: 97.9 F (36.6 C) 97.9 F (36.6 C)  SpO2:  96%   There is no height or weight on file to calculate BMI.  General:  WDWN in NAD Gait: Not observed HENT: WNL, normocephalic Pulmonary: normal non-labored breathing Cardiac: regular Abdomen:  soft, NT/ND, no masses  Skin: without rashes Vascular Exam/Pulses: nonpalpable pedal pulses, brisk cap refill. No doppler available. Palpable femoral pulses Extremities: right groin and lower extremity incisions healing appropriately without drainage or dehiscence. Left groin incision with small area of dehiscence at the superior pole. Mild serous drainage can be expressed from this area, no seroma present Musculoskeletal: no muscle wasting or atrophy  Neurologic: A&O X 3;  No focal weakness or paresthesias are detected; speech is fluent/normal Psychiatric:  The pt has Normal affect. Lymph:  Unremarkable         CBC    Component Value Date/Time   WBC 10.3 06/03/2023 0538   RBC 3.22 (L) 06/03/2023 0538   HGB 9.5 (L) 06/03/2023 0538   HGB 12.3 08/28/2012 0000   HCT 29.5 (L) 06/03/2023 0538   HCT 36 08/28/2012 0000   PLT 351 06/03/2023 0538   MCV 91.6 06/03/2023 0538   MCV 92.4 08/28/2012 0000   MCH 29.5 06/03/2023 0538   MCHC 32.2 06/03/2023 0538   RDW 14.1 06/03/2023 0538   LYMPHSABS 1.1 06/03/2023 0538   MONOABS 1.1 (H) 06/03/2023 0538   EOSABS 0.3 06/03/2023 0538   BASOSABS 0.0 06/03/2023 0538    BMET    Component Value Date/Time   NA 131 (L) 06/03/2023 0538   K  4.3 06/03/2023 0538   CL 99 06/03/2023 0538   CO2 24 06/03/2023 0538   GLUCOSE 120 (H) 06/03/2023 0538   BUN 19 06/03/2023 0538   CREATININE 0.79 06/03/2023 0538   CREATININE 0.62 08/28/2012 0000   CALCIUM 8.4 (L) 06/03/2023 0538   CALCIUM 8.5 08/28/2012 0000   GFRNONAA >60 06/03/2023 0538   GFRAA >60 12/23/2018 0332    COAGS: Lab Results  Component Value Date   INR 1.0 06/02/2023   INR 1.0 05/19/2023   INR 1.0 12/19/2018     ASSESSMENT/PLAN: This is a 73 y.o. female in need of a wound check   -The patient recently underwent aortobiiliac stenting, bilateral femoral endarterectomies with bovine patch plasty, and right femoral to below knee bypass with ipsilateral GSV on 1/17 by Dr.Kortlynn Poust. This was done for rest pain. She was discharged 4 days ago without issue -She presented to the ED yesterday because family had concerns about the appearance of her left groin incision. Upon presentation she had slurred speech and tongue numbness, so a code stroke was called. Potentially the patient had a TIA. She is undergoing workup with neurology.  -The patient says about 3 days ago a small area of her left groin incision opened up while she was moving around. Since then she has had mild serous drainage from this area. She denies any tenderness, redness, pus-like drainage, or fevers. Her daughter has been keeping a clean, dry gauze over the area. -On my exam her lower extremities are well perfused with brisk cap refill. She does have edema in the right lower leg and foot, likely due to reperfusion swelling. Her right lower extremity incisions are healing appropriately. Her left groin incision has a very small area of dehiscence at the superior pole. I can express a small amount of serous drainage -At this time I have low concern for infection of the patient's incision. I believe her incision should continue to heal appropriately with local wound care. She should continue to keep this area dry to  promote healing. She does have a follow up with our office in a few weeks for an incision check -Dr.Zaliyah Meikle will evaluate the patient and offer further treatment plans  Loel Dubonnet PA-C [facility] [phone]    I agree with the above.  I have seen and evaluated the patient.  WElls Ruffus Kamaka

## 2023-06-03 NOTE — Progress Notes (Signed)
  Echocardiogram 2D Echocardiogram has been performed.  Lauren King 06/03/2023, 11:56 AM

## 2023-06-03 NOTE — Procedures (Signed)
Patient Name: Lauren King  MRN: [id number]  Epilepsy Attending: Charlsie Quest  Referring Physician/Provider: Gordy Councilman, MD  Date: 06/03/2023 Duration: 24.29 mins  Patient history:  73yo F with transient facial numbness and slurred speech. EEG to evaluate for seizure  Level of alertness: Awake, asleep  AEDs during EEG study: None  Technical aspects: This EEG study was done with scalp electrodes positioned according to the 10-20 International system of electrode placement. Electrical activity was reviewed with band pass filter of 1-70Hz , sensitivity of 7 uV/mm, display speed of 67mm/sec with a 60Hz  notched filter applied as appropriate. EEG data were recorded continuously and digitally stored.  Video monitoring was available and reviewed as appropriate.  Description: The posterior dominant rhythm consists of 8 Hz activity of moderate voltage (25-35 uV) seen predominantly in posterior head regions, symmetric and reactive to eye opening and eye closing. Sleep was characterized by vertex waves, sleep spindles (12 to 14 Hz), maximal frontocentral region. EEG showed intermittent generalized 3 to 6 Hz theta-delta slowing. Hyperventilation and photic stimulation were not performed.     ABNORMALITY - Intermittent slow, generalized  IMPRESSION: This study is suggestive of mild diffuse encephalopathy. No seizures or epileptiform discharges were seen throughout the recording.  Flora Ratz Annabelle Harman

## 2023-06-03 NOTE — Evaluation (Signed)
Occupational Therapy Evaluation Patient Details Name: Lauren King MRN: [id number] DOB: January 20, 1951 Today's Date: 06/03/2023   History of Present Illness 73 year old female with past medical history significant for HTN, PAD, HLD, COPD, OSA on CPAP, anxiety and depression.  She was recently admitted 1/17 -1/21 with critical limb ischemia of RLE and aortic iliac occlusive disease.  During that hospitalization she underwent right and left femoral endarterectomy.  Adm 1/23 as a code stroke because patient was noted to have trouble speaking/slurred speech.  MRI:No acute intracranial abnormality. 1.5 cm cystic lesion.   Clinical Impression   Patient admitted for the diagnosis above.  PTA she lives with her daughter, who assists as needed post vascular surgery.  Currently her incisions are sore, but she is essentially at her post surgery baseline.  Patient is able to get into and out of bed, walking good with a RW, and is needing Min A for lower body ADL.  No OT needs identified in the acute setting, and no post acute OT anticipated.  Patient has all needed support at home and all needed DME.   .         If plan is discharge home, recommend the following: A little help with walking and/or transfers;A little help with bathing/dressing/bathroom;Assistance with cooking/housework;Assist for transportation;Help with stairs or ramp for entrance    Functional Status Assessment  Patient has had a recent decline in their functional status and demonstrates the ability to make significant improvements in function in a reasonable and predictable amount of time.  Equipment Recommendations  None recommended by OT    Recommendations for Other Services       Precautions / Restrictions Precautions Precautions: Fall Restrictions Weight Bearing Restrictions Per Provider Order: No      Mobility Bed Mobility Overal bed mobility: Modified Independent                  Transfers Overall transfer  level: Modified independent Equipment used: Rolling walker (2 wheels)                      Balance   Sitting-balance support: No upper extremity supported, Feet supported Sitting balance-Leahy Scale: Normal     Standing balance support: Bilateral upper extremity supported Standing balance-Leahy Scale: Good                             ADL either performed or assessed with clinical judgement   ADL Overall ADL's : At baseline                                             Vision Patient Visual Report: No change from baseline       Perception Perception: Within Functional Limits       Praxis Praxis: WFL       Pertinent Vitals/Pain Pain Assessment Pain Assessment: Faces Faces Pain Scale: Hurts little more Pain Location: R leg/knee/incisions Pain Descriptors / Indicators: Tender, Sore Pain Intervention(s): Premedicated before session     Extremity/Trunk Assessment Upper Extremity Assessment Upper Extremity Assessment: Overall WFL for tasks assessed   Lower Extremity Assessment Lower Extremity Assessment: Defer to PT evaluation   Cervical / Trunk Assessment Cervical / Trunk Assessment: Normal   Communication Communication Communication: No apparent difficulties   Cognition Arousal: Alert Behavior During Therapy: [facility] for tasks  assessed/performed Overall Cognitive Status: Within Functional Limits for tasks assessed                                       General Comments   VSS on RA    Exercises     Shoulder Instructions      Home Living Family/patient expects to be discharged to:: Private residence Living Arrangements: Children;Other relatives Available Help at Discharge: Family;Available 24 hours/day;Available PRN/intermittently Type of Home: Apartment Home Access: Elevator;Stairs to enter Entrance Stairs-Number of Steps: flight Entrance Stairs-Rails: Right;Left Home Layout: One level     Bathroom  Shower/Tub: Chief Strategy Officer: Handicapped height Bathroom Accessibility: Yes How Accessible: Accessible via walker Home Equipment: Rolling Walker (2 wheels);Grab bars - tub/shower;Wheelchair - manual          Prior Functioning/Environment Prior Level of Function : Independent/Modified Independent             Mobility Comments: Independent without an AD prior to surgery, using 2WRW post ADLs Comments: Independent with ADLs and IADLs; has a driver's license but typically doesn't drive; family assist with transportation and IADLs PRN. Post surgery daughter does assist with LB ADL.        OT Problem List: Decreased activity tolerance;Impaired balance (sitting and/or standing);Pain      OT Treatment/Interventions:      OT Goals(Current goals can be found in the care plan section) Acute Rehab OT Goals Patient Stated Goal: Hoping to return home OT Goal Formulation: With patient Time For Goal Achievement: 06/06/23 Potential to Achieve Goals: Good  OT Frequency:      Co-evaluation              AM-PAC OT "6 Clicks" Daily Activity     Outcome Measure Help from another person eating meals?: None Help from another person taking care of personal grooming?: None Help from another person toileting, which includes using toliet, bedpan, or urinal?: None Help from another person bathing (including washing, rinsing, drying)?: A Little Help from another person to put on and taking off regular upper body clothing?: None Help from another person to put on and taking off regular lower body clothing?: A Little 6 Click Score: 22   End of Session Equipment Utilized During Treatment: Rolling walker (2 wheels) Nurse Communication: Mobility status  Activity Tolerance: Patient tolerated treatment well Patient left: in bed;with call bell/phone within reach  OT Visit Diagnosis: Pain;Other (comment);Unsteadiness on feet (R26.81)                Time: 2440-1027 OT Time  Calculation (min): 17 min Charges:  OT General Charges $OT Visit: 1 Visit OT Evaluation $OT Eval Moderate Complexity: 1 Mod  06/03/2023  RP, OTR/L  Acute Rehabilitation Services  Office:  [phone]   Suzanna Obey 06/03/2023, 10:24 AM

## 2023-06-07 ENCOUNTER — Telehealth: Payer: Self-pay

## 2023-06-07 NOTE — Telephone Encounter (Signed)
Pt called to get follow-up appt date and time

## 2023-06-08 ENCOUNTER — Telehealth: Payer: Self-pay

## 2023-06-08 ENCOUNTER — Emergency Department (HOSPITAL_COMMUNITY)
Admission: EM | Admit: 2023-06-08 | Discharge: 2023-06-08 | Disposition: A | Discharge status: Home / Self Care | Payer: Medicare HMO | Attending: Emergency Medicine | Admitting: Emergency Medicine

## 2023-06-08 ENCOUNTER — Emergency Department (HOSPITAL_COMMUNITY): Payer: Medicare HMO

## 2023-06-08 ENCOUNTER — Emergency Department (HOSPITAL_BASED_OUTPATIENT_CLINIC_OR_DEPARTMENT_OTHER): Payer: Medicare HMO

## 2023-06-08 DIAGNOSIS — Z7982 Long term (current) use of aspirin: Secondary | ICD-10-CM | POA: Insufficient documentation

## 2023-06-08 DIAGNOSIS — Z7901 Long term (current) use of anticoagulants: Secondary | ICD-10-CM | POA: Diagnosis not present

## 2023-06-08 DIAGNOSIS — L03115 Cellulitis of right lower limb: Secondary | ICD-10-CM | POA: Diagnosis not present

## 2023-06-08 DIAGNOSIS — M7989 Other specified soft tissue disorders: Secondary | ICD-10-CM | POA: Diagnosis not present

## 2023-06-08 DIAGNOSIS — M79604 Pain in right leg: Secondary | ICD-10-CM | POA: Diagnosis present

## 2023-06-08 DIAGNOSIS — J449 Chronic obstructive pulmonary disease, unspecified: Secondary | ICD-10-CM | POA: Insufficient documentation

## 2023-06-08 DIAGNOSIS — R600 Localized edema: Secondary | ICD-10-CM | POA: Insufficient documentation

## 2023-06-08 LAB — COMPREHENSIVE METABOLIC PANEL
ALT: 64 U/L — ABNORMAL HIGH (ref 0–44)
AST: 57 U/L — ABNORMAL HIGH (ref 15–41)
Albumin: 2.9 g/dL — ABNORMAL LOW (ref 3.5–5.0)
Alkaline Phosphatase: 93 U/L (ref 38–126)
Anion gap: 10 (ref 5–15)
BUN: 13 mg/dL (ref 8–23)
CO2: 21 mmol/L — ABNORMAL LOW (ref 22–32)
Calcium: 8.6 mg/dL — ABNORMAL LOW (ref 8.9–10.3)
Chloride: 103 mmol/L (ref 98–111)
Creatinine, Ser: 0.65 mg/dL (ref 0.44–1.00)
GFR, Estimated: >60 mL/min (ref >60)
Glucose, Bld: 96 mg/dL (ref 70–99)
Potassium: 4.1 mmol/L (ref 3.5–5.1)
Sodium: 134 mmol/L — ABNORMAL LOW (ref 135–145)
Total Bilirubin: 0.5 mg/dL (ref 0.0–1.2)
Total Protein: 6.2 g/dL — ABNORMAL LOW (ref 6.5–8.1)

## 2023-06-08 LAB — CBC WITH DIFFERENTIAL/PLATELET
Abs Immature Granulocytes: 0.16 10*3/uL — ABNORMAL HIGH (ref 0.00–0.07)
Basophils Absolute: 0.1 10*3/uL (ref 0.0–0.1)
Basophils Relative: 1 %
Eosinophils Absolute: 0.3 10*3/uL (ref 0.0–0.5)
Eosinophils Relative: 2 %
HCT: 33.5 % — ABNORMAL LOW (ref 36.0–46.0)
Hemoglobin: 10.5 g/dL — ABNORMAL LOW (ref 12.0–15.0)
Immature Granulocytes: 1 %
Lymphocytes Relative: 15 %
Lymphs Abs: 1.9 10*3/uL (ref 0.7–4.0)
MCH: 29.4 pg (ref 26.0–34.0)
MCHC: 31.3 g/dL (ref 30.0–36.0)
MCV: 93.8 fL (ref 80.0–100.0)
Monocytes Absolute: 0.8 10*3/uL (ref 0.1–1.0)
Monocytes Relative: 6 %
Neutro Abs: 9.5 10*3/uL — ABNORMAL HIGH (ref 1.7–7.7)
Neutrophils Relative %: 75 %
Platelets: 584 10*3/uL — ABNORMAL HIGH (ref 150–400)
RBC: 3.57 MIL/uL — ABNORMAL LOW (ref 3.87–5.11)
RDW: 14.8 % (ref 11.5–15.5)
WBC: 12.7 10*3/uL — ABNORMAL HIGH (ref 4.0–10.5)
nRBC: 0 % (ref 0.0–0.2)

## 2023-06-08 LAB — I-STAT CG4 LACTIC ACID, ED: Lactic Acid, Venous: 1.2 mmol/L (ref 0.5–1.9)

## 2023-06-08 LAB — BRAIN NATRIURETIC PEPTIDE: B Natriuretic Peptide: 139.1 pg/mL — ABNORMAL HIGH (ref 0.0–100.0)

## 2023-06-08 MED ORDER — ACETAMINOPHEN 325 MG PO TABS
650.0000 mg | ORAL_TABLET | Freq: Once | ORAL | Status: AC
  Administered 2023-06-08: 650 mg via ORAL
  Filled 2023-06-08: qty 2

## 2023-06-08 MED ORDER — CEPHALEXIN 250 MG PO CAPS
500.0000 mg | ORAL_CAPSULE | Freq: Once | ORAL | Status: AC
  Administered 2023-06-08: 500 mg via ORAL
  Filled 2023-06-08: qty 2

## 2023-06-08 MED ORDER — CEPHALEXIN 500 MG PO CAPS: 500.0000 mg | ORAL_CAPSULE | Freq: Four times a day (QID) | ORAL | 0 refills | Status: AC

## 2023-06-08 NOTE — Telephone Encounter (Signed)
Triage Call:  Pt left message on triage nurse line stating she tripped over the cat, hurt her knee and had another question about some "phlegm I chucked up"

## 2023-06-08 NOTE — Discharge Instructions (Signed)
You were seen in the emergency department for your leg pain and swelling.  You had no broken bones or injury from your fall and no signs of blood clots.  You do have evidence of an early skin infection around your wound near your knee and I have given you antibiotics he should complete this as prescribed.  You can take Tylenol every 6 hours as needed for pain and you can keep your legs elevated to help with the swelling.  You should follow-up with your vascular surgeon in the next few days to have your wound rechecked.  You should return to the emergency department if you are having fevers despite the antibiotics, increased redness streaking up your leg despite the antibiotics or any other new or concerning symptoms.

## 2023-06-08 NOTE — ED Provider Notes (Signed)
EMERGENCY DEPARTMENT AT [facility] Provider Note   CSN: [id number] Arrival date & time: 06/08/23  1601     History  Chief Complaint  Patient presents with   Post-op Problem    Lauren King is a 73 y.o. female.  Patient is a 73 year old female with past medical history of recent right femoral endarterectomy, recent TIA, COPD, PAD that presented to the emergency department with right leg pain and swelling.  The patient states that she has had some swelling in her legs since her surgery but today she tripped over her cat and landed on her right knee.  She states that since then her leg has been more red and swollen.  She states that she is mostly having pain in her knee.  She denies any fevers.  The history is provided by the patient.       Home Medications Prior to Admission medications   Medication Sig Start Date End Date Taking? Authorizing Provider  albuterol (VENTOLIN HFA) 108 (90 Base) MCG/ACT inhaler Inhale 1-2 puffs into the lungs every 6 (six) hours as needed for wheezing or shortness of breath.   Yes [provider]  aspirin EC 81 MG tablet Take 81 mg by mouth daily.   Yes [provider]  cephALEXin (KEFLEX) 500 MG capsule Take 1 capsule (500 mg total) by mouth 4 (four) times daily for 7 days. 06/08/23 06/15/23 Yes Elayne Snare K, DO  clopidogrel (PLAVIX) 75 MG tablet Take 1 tablet (75 mg total) by mouth daily. 10/23/20  Yes Chuck Hint, MD  losartan-hydrochlorothiazide (HYZAAR) 50-12.5 MG tablet Take 1 tablet by mouth daily. 09/29/21  Yes [provider]  mirtazapine (REMERON) 15 MG tablet Take 15 mg by mouth at bedtime. 04/04/23  Yes [provider]  Oxycodone HCl 10 MG TABS Take 10 mg by mouth every 4 (four) hours as needed for severe pain. 10/15/21  Yes [provider]  rosuvastatin (CRESTOR) 20 MG tablet Take 20 mg by mouth at bedtime.   Yes [provider]  TRELEGY ELLIPTA  100-62.5-25 MCG/INH AEPB Inhale 1 puff into the lungs daily as needed (Shortness of breath).   Yes [provider]      Allergies    Patient has no known allergies.    Review of Systems   Review of Systems  Physical Exam Updated Vital Signs BP 103/61   Pulse 66   Temp 98.5 F (36.9 C) (Oral)   Resp 18   SpO2 93%  Physical Exam Vitals and nursing note reviewed.  Constitutional:      General: She is not in acute distress.    Appearance: Normal appearance.  HENT:     Head: Normocephalic and atraumatic.     Nose: Nose normal.     Mouth/Throat:     Mouth: Mucous membranes are moist.  Eyes:     Extraocular Movements: Extraocular movements intact.     Conjunctiva/sclera: Conjunctivae normal.  Cardiovascular:     Rate and Rhythm: Normal rate.  Pulmonary:     Effort: Pulmonary effort is normal.  Abdominal:     General: Abdomen is flat.  Musculoskeletal:        General: Tenderness (R knee, no knee joint laxity, flexion/extension intact) present. Normal range of motion.     Cervical back: Normal range of motion.     Right lower leg: Edema (2+) present.     Left lower leg: Edema (2+) present.  Skin:    General:  Skin is warm and dry.     Capillary Refill: Capillary refill takes less than 2 seconds.     Comments: RLE erythematous compared to L, scars appear well healed with mild surrounding erythema, mild warmth, no drainage, no palpable fluctance  Neurological:     General: No focal deficit present.     Mental Status: She is alert and oriented to person, place, and time.  Psychiatric:        Mood and Affect: Mood normal.        Behavior: Behavior normal.     ED Results / Procedures / Treatments   Labs (all labs ordered are listed, but only abnormal results are displayed) Labs Reviewed  CBC WITH DIFFERENTIAL/PLATELET - Abnormal; Notable for the following components:      Result Value   WBC 12.7 (*)    RBC 3.57 (*)    Hemoglobin 10.5 (*)    HCT 33.5 (*)     Platelets 584 (*)    Neutro Abs 9.5 (*)    Abs Immature Granulocytes 0.16 (*)    All other components within normal limits  COMPREHENSIVE METABOLIC PANEL - Abnormal; Notable for the following components:   Sodium 134 (*)    CO2 21 (*)    Calcium 8.6 (*)    Total Protein 6.2 (*)    Albumin 2.9 (*)    AST 57 (*)    ALT 64 (*)    All other components within normal limits  BRAIN NATRIURETIC PEPTIDE - Abnormal; Notable for the following components:   B Natriuretic Peptide 139.1 (*)    All other components within normal limits  I-STAT CG4 LACTIC ACID, ED    EKG None  Radiology DG Knee Complete 4 Views Right Result Date: 06/08/2023 CLINICAL DATA:  MC-DGfall EXAM: RIGHT KNEE - COMPLETE 4+ VIEW COMPARISON:  None Available. FINDINGS: No fracture of the proximal tibia or distal femur. Patella is normal. No joint effusion. Vascular stent noted IMPRESSION: No fracture or dislocation. Electronically Signed   By: Genevive Bi M.D.   On: 06/08/2023 19:34   VAS [place] LOWER EXTREMITY VENOUS (DVT) (7a-7p) Result Date: 06/08/2023  Lower Venous DVT Study Patient Name:  Lauren King [facility]  Date of Exam:   06/08/2023 Medical Rec #: [id number]        Accession #:    [id number] Date of Birth: 03/29/1951        Patient Gender: F Patient Age:   70 years Exam Location:  [facility] Procedure:      VAS [place] LOWER EXTREMITY VENOUS (DVT) Referring Phys: Elayne Snare --------------------------------------------------------------------------------  Indications: Pain, Swelling, Edema, and Status post right femoral endarterectomy and redo left femoral endarterectomy bypass graft on 05/27/23.  Comparison Study: 06/03/23 Performing Technologist: Marilynne Halsted RDMS, RVT  Examination Guidelines: A complete evaluation includes B-mode imaging, spectral Doppler, color Doppler, and power Doppler as needed of all accessible portions of each vessel. Bilateral testing is considered an integral part of a complete examination.  Limited examinations for reoccurring indications may be performed as noted. The reflux portion of the exam is performed with the patient in reverse Trendelenburg.  +---------+---------------+---------+-----------+----------+--------------+ RIGHT    CompressibilityPhasicitySpontaneityPropertiesThrombus Aging +---------+---------------+---------+-----------+----------+--------------+ CFV      Full           Yes      Yes                                 +---------+---------------+---------+-----------+----------+--------------+  SFJ      Full                                                        +---------+---------------+---------+-----------+----------+--------------+ FV Prox  Full                                                        +---------+---------------+---------+-----------+----------+--------------+ FV Mid   Full                                                        +---------+---------------+---------+-----------+----------+--------------+ FV DistalFull                                                        +---------+---------------+---------+-----------+----------+--------------+ PFV      Full                                                        +---------+---------------+---------+-----------+----------+--------------+ POP      Full           Yes      Yes                                 +---------+---------------+---------+-----------+----------+--------------+ PTV      Full                                                        +---------+---------------+---------+-----------+----------+--------------+ PERO     Full                                                        +---------+---------------+---------+-----------+----------+--------------+     Summary: RIGHT: - There is no evidence of deep vein thrombosis in the lower extremity.  - No cystic structure found in the popliteal fossa. - A cystic structure noted in the mid upper  thigh.  LEFT: - No evidence of common femoral vein obstruction.   *See table(s) above for measurements and observations.    Preliminary     Procedures Procedures    Medications Ordered in ED Medications  cephALEXin (KEFLEX) capsule 500 mg (has no administration in time range)  acetaminophen (TYLENOL) tablet 650 mg (650 mg Oral Given 06/08/23 1850)    ED Course/ Medical Decision Making/ A&P Clinical Course as  of 06/08/23 2151  Wed Jun 08, 2023  2139 I spoke with Dr. Lenell Antu with vascular who agrees with oral antibiotics with keflex and outpatient follow up. [VK]    Clinical Course User Index [VK] Rexford Maus, DO                                 Medical Decision Making This patient presents to the ED with chief complaint(s) of RLE pain/swelling with pertinent past medical history of recent R fem endarterectomy, right pop bypass, COPD, recent TIA which further complicates the presenting complaint. The complaint involves an extensive differential diagnosis and also carries with it a high risk of complications and morbidity.    The differential diagnosis includes considering knee fracture or dislocation, sprain, cellulitis, DVT, sepsis  Additional history obtained: Additional history obtained from N/A Records reviewed previous admission documents  ED Course and Reassessment: On patient's arrival she is hemodynamically stable in no acute distress.  She does have some erythematous and warmth to her right leg though with good perfusion and swelling appears equal in bilateral legs, will have workup to evaluate for infection, DVT, fracture or other etiology of the swelling and will be closely reassessed.  Independent labs interpretation:  The following labs were independently interpreted: Mild leukocytosis otherwise no acute abnormality  Independent visualization of imaging: - I independently visualized the following imaging with scope of interpretation limited to determining acute  life threatening conditions related to emergency care: Right DVT ultrasound, right knee x-ray, which revealed no acute disease  Consultation: - Consulted or discussed management/test interpretation w/ external professional: Vascular  Consideration for admission or further workup: Patient has no emergent conditions requiring admission or further work-up at this time and is stable for discharge home with vascular follow-up  Social Determinants of health: N/A    Amount and/or Complexity of Data Reviewed Labs: ordered. Radiology: ordered.  Risk OTC drugs. Prescription drug management.          Final Clinical Impression(s) / ED Diagnoses Final diagnoses:  Cellulitis of right lower extremity    Rx / DC Orders ED Discharge Orders          Ordered    cephALEXin (KEFLEX) 500 MG capsule  4 times daily        06/08/23 2150              Rexford Maus, DO 06/08/23 2151

## 2023-06-08 NOTE — Progress Notes (Signed)
Right lower ext venous  has been completed. Refer to [facility] under chart review to view preliminary results.   06/08/2023  6:27 PM Lauren King, Gerarda Gunther

## 2023-06-08 NOTE — ED Triage Notes (Signed)
Pt BIB [facility] from home, patient post op 12 days of right lower extremity, patient presents today with right lower limb redness and inflammation and bilateral groin incision drainage.   History of stroke on 1/23

## 2023-06-15 ENCOUNTER — Telehealth: Payer: Self-pay

## 2023-06-15 NOTE — Telephone Encounter (Signed)
 Caller: Patient  Concern: Swollen foot, blisters  Location: right leg  Treatments:  Instructed to elevate, finish ABX  Procedure:  femoral endarterectomy  Consulted: Matt, PA  Resolution: Appointment scheduled for next available triage  Next Appt: Appointment scheduled for 2/6 @ 0900

## 2023-06-16 ENCOUNTER — Other Ambulatory Visit: Payer: Self-pay

## 2023-06-16 ENCOUNTER — Ambulatory Visit (INDEPENDENT_AMBULATORY_CARE_PROVIDER_SITE_OTHER): Payer: 59 | Admitting: Physician Assistant

## 2023-06-16 VITALS — BP 146/81 | HR 89 | Temp 98.7°F | Ht 62.0 in | Wt 79.0 lb

## 2023-06-16 DIAGNOSIS — I70223 Atherosclerosis of native arteries of extremities with rest pain, bilateral legs: Secondary | ICD-10-CM

## 2023-06-16 MED ORDER — CEPHALEXIN 500 MG PO CAPS: 500.0000 mg | ORAL_CAPSULE | Freq: Three times a day (TID) | ORAL | 0 refills | Status: DC

## 2023-06-16 NOTE — H&P (View-Only) (Signed)
 POST OPERATIVE OFFICE NOTE    CC:  F/u for surgery  HPI:  This is a 73 y.o. female who is s/p  #1: Endovascular aortobiiliac stent (CPT [phone]) using Endologix AFX 22 40-13-40 #2: Right femoral endarterectomy with bovine pericardial patch angioplasty #3: Right iliofemoral and profundofemoral thrombectomy #4: Left femoral exposure with left femoral endarterectomy and bovine pericardial patch angioplasty #5: Angioplasty, left and right external iliac artery #6: Right femoral to below-knee popliteal artery bypass graft with ipsilateral translocated nonreversed saphenous vein #7: Placement of Kerecis skin substitute to bilateral femoral incisions    on 05/30/2023 by Dr. Serene.    She was discharged on POD 3.    She was seen in consult on 06/03/2023 by Dr. Serene for incision check. She was sent to the hospital with slurred speech and tongue numbness and code stroke was initiated.  It was felt she had a TIA and was admitted for workup.  Her incision was having some mild serous drainage.   She did have some right foot swelling since revascularization and was slowly getting better. There was low suspicion for infection and was instructed to keep her f/u appt.    She presented to the ER on 06/08/2023 for worsening leg swelling.  DVT study was negative for DVT.  She was also having right knee pain and xray revealed no fracture or dislocation.   She called the office yesterday with c/o swollen foot and blisters.  She was instructed to finish her abx and elevate her leg and scheduled for appt today.  Pt returns today for follow up & here with her daughter.  Pt states she continues to have drainage from the left groin.  It is clear fluid.  She has to change her clothes 4-5 times per day due to soilage.  She states she ran out of her abx this morning.  She states that her legs have been swelling and were worse yesterday.  Her daughter is a CNA and has her elevating her legs above her heart when she is  not up and about.  She states that she has pain in her legs but her pre op pain has significantly improved.  She is taking her asa/plavix  and statin.  She has cut back on her smoking with the patches.  She states that she smoke a couple of cigarettes yesterday, which is much less.    No Known Allergies  Current Outpatient Medications  Medication Sig Dispense Refill   albuterol  (VENTOLIN  HFA) 108 (90 Base) MCG/ACT inhaler Inhale 1-2 puffs into the lungs every 6 (six) hours as needed for wheezing or shortness of breath.     aspirin  EC 81 MG tablet Take 81 mg by mouth daily.     clopidogrel  (PLAVIX ) 75 MG tablet Take 1 tablet (75 mg total) by mouth daily. 30 tablet 6   losartan -hydrochlorothiazide  (HYZAAR) 50-12.5 MG tablet Take 1 tablet by mouth daily.     mirtazapine  (REMERON ) 15 MG tablet Take 15 mg by mouth at bedtime.     Oxycodone  HCl 10 MG TABS Take 10 mg by mouth every 4 (four) hours as needed for severe pain.     rosuvastatin  (CRESTOR ) 20 MG tablet Take 20 mg by mouth at bedtime.     TRELEGY ELLIPTA 100-62.5-25 MCG/INH AEPB Inhale 1 puff into the lungs daily as needed (Shortness of breath).     No current facility-administered medications for this visit.     ROS:  See HPI  Physical Exam:  Today's Vitals  06/16/23 0912 06/16/23 0916  BP: (!) 146/81   Pulse: 89   Temp: 98.7 F (37.1 C)   SpO2: 94%   Weight: 79 lb (35.8 kg)   Height: 5' 2 (1.575 m)   PainSc: 8  8   PainLoc: Leg    Body mass index is 14.45 kg/m.   Incision:  left groin incision with superior superficial dehiscence with serous drainage  Right groin healed nicely Right lower leg incision with some erythema; no drainage present   Extremities:  brisk doppler flow right DP/PT/peroneal and left PT BLE swelling as pictured      Assessment/Plan:  This is a 73 y.o. female who is s/p: #1: Endovascular aortobiiliac stent (CPT [phone]) using Endologix AFX 22 40-13-40 #2: Right femoral endarterectomy with  bovine pericardial patch angioplasty #3: Right iliofemoral and profundofemoral thrombectomy #4: Left femoral exposure with left femoral endarterectomy and bovine pericardial patch angioplasty #5: Angioplasty, left and right external iliac artery #6: Right femoral to below-knee popliteal artery bypass graft with ipsilateral translocated nonreversed saphenous vein #7: Placement of Kerecis skin substitute to bilateral femoral incisions    on 05/30/2023 by Dr. Serene.    -pt with brisk doppler flow bilaterally.  Her pre op rest pain improved.  She is having pain with the swelling in her legs.  We measured her for 15-20 mmHg knee high compression socks and gave her a pair today.  Discussed elevating her legs for 15-20 minutes at a time when not up and about.  Her daughter is doing a good job with this.   She had a right leg DVT study that was negative on 06/08/2023.   -left groin with serous drainage-this has continued to drain and she is having to change her clothes several times a day due to soilage.   Pt seen with Dr. Lanis who recommended I&D left groin with possible wound vac.  Will get this scheduled with Dr. Serene.  Twyla her dry gauze to put over her left groin incision. -she was given a refill on her Keflex  given the drainage and erythema of the right leg incision.  -she will continue her asa/statin/plavix  -discussed the importance of smoking cessation and she has done a good job cutting back.  Encouraged her to continue to work on this.    Lucie Apt, [facility] [phone]   Clinic MD:  Lanis

## 2023-06-16 NOTE — Progress Notes (Signed)
 POST OPERATIVE OFFICE NOTE    CC:  F/u for surgery  HPI:  This is a 73 y.o. female who is s/p  #1: Endovascular aortobiiliac stent (CPT [phone]) using Endologix AFX 22 40-13-40 #2: Right femoral endarterectomy with bovine pericardial patch angioplasty #3: Right iliofemoral and profundofemoral thrombectomy #4: Left femoral exposure with left femoral endarterectomy and bovine pericardial patch angioplasty #5: Angioplasty, left and right external iliac artery #6: Right femoral to below-knee popliteal artery bypass graft with ipsilateral translocated nonreversed saphenous vein #7: Placement of Kerecis skin substitute to bilateral femoral incisions    on 05/30/2023 by Dr. Serene.    She was discharged on POD 3.    She was seen in consult on 06/03/2023 by Dr. Serene for incision check. She was sent to the hospital with slurred speech and tongue numbness and code stroke was initiated.  It was felt she had a TIA and was admitted for workup.  Her incision was having some mild serous drainage.   She did have some right foot swelling since revascularization and was slowly getting better. There was low suspicion for infection and was instructed to keep her f/u appt.    She presented to the ER on 06/08/2023 for worsening leg swelling.  DVT study was negative for DVT.  She was also having right knee pain and xray revealed no fracture or dislocation.   She called the office yesterday with c/o swollen foot and blisters.  She was instructed to finish her abx and elevate her leg and scheduled for appt today.  Pt returns today for follow up & here with her daughter.  Pt states she continues to have drainage from the left groin.  It is clear fluid.  She has to change her clothes 4-5 times per day due to soilage.  She states she ran out of her abx this morning.  She states that her legs have been swelling and were worse yesterday.  Her daughter is a CNA and has her elevating her legs above her heart when she is  not up and about.  She states that she has pain in her legs but her pre op pain has significantly improved.  She is taking her asa/plavix  and statin.  She has cut back on her smoking with the patches.  She states that she smoke a couple of cigarettes yesterday, which is much less.    No Known Allergies  Current Outpatient Medications  Medication Sig Dispense Refill   albuterol  (VENTOLIN  HFA) 108 (90 Base) MCG/ACT inhaler Inhale 1-2 puffs into the lungs every 6 (six) hours as needed for wheezing or shortness of breath.     aspirin  EC 81 MG tablet Take 81 mg by mouth daily.     clopidogrel  (PLAVIX ) 75 MG tablet Take 1 tablet (75 mg total) by mouth daily. 30 tablet 6   losartan -hydrochlorothiazide  (HYZAAR) 50-12.5 MG tablet Take 1 tablet by mouth daily.     mirtazapine  (REMERON ) 15 MG tablet Take 15 mg by mouth at bedtime.     Oxycodone  HCl 10 MG TABS Take 10 mg by mouth every 4 (four) hours as needed for severe pain.     rosuvastatin  (CRESTOR ) 20 MG tablet Take 20 mg by mouth at bedtime.     TRELEGY ELLIPTA 100-62.5-25 MCG/INH AEPB Inhale 1 puff into the lungs daily as needed (Shortness of breath).     No current facility-administered medications for this visit.     ROS:  See HPI  Physical Exam:  Today's Vitals  06/16/23 0912 06/16/23 0916  BP: (!) 146/81   Pulse: 89   Temp: 98.7 F (37.1 C)   SpO2: 94%   Weight: 79 lb (35.8 kg)   Height: 5' 2 (1.575 m)   PainSc: 8  8   PainLoc: Leg    Body mass index is 14.45 kg/m.   Incision:  left groin incision with superior superficial dehiscence with serous drainage  Right groin healed nicely Right lower leg incision with some erythema; no drainage present   Extremities:  brisk doppler flow right DP/PT/peroneal and left PT BLE swelling as pictured      Assessment/Plan:  This is a 73 y.o. female who is s/p: #1: Endovascular aortobiiliac stent (CPT [phone]) using Endologix AFX 22 40-13-40 #2: Right femoral endarterectomy with  bovine pericardial patch angioplasty #3: Right iliofemoral and profundofemoral thrombectomy #4: Left femoral exposure with left femoral endarterectomy and bovine pericardial patch angioplasty #5: Angioplasty, left and right external iliac artery #6: Right femoral to below-knee popliteal artery bypass graft with ipsilateral translocated nonreversed saphenous vein #7: Placement of Kerecis skin substitute to bilateral femoral incisions    on 05/30/2023 by Dr. Serene.    -pt with brisk doppler flow bilaterally.  Her pre op rest pain improved.  She is having pain with the swelling in her legs.  We measured her for 15-20 mmHg knee high compression socks and gave her a pair today.  Discussed elevating her legs for 15-20 minutes at a time when not up and about.  Her daughter is doing a good job with this.   She had a right leg DVT study that was negative on 06/08/2023.   -left groin with serous drainage-this has continued to drain and she is having to change her clothes several times a day due to soilage.   Pt seen with Dr. Lanis who recommended I&D left groin with possible wound vac.  Will get this scheduled with Dr. Serene.  Twyla her dry gauze to put over her left groin incision. -she was given a refill on her Keflex  given the drainage and erythema of the right leg incision.  -she will continue her asa/statin/plavix  -discussed the importance of smoking cessation and she has done a good job cutting back.  Encouraged her to continue to work on this.    Lucie Apt, [facility] [phone]   Clinic MD:  Lanis

## 2023-06-17 ENCOUNTER — Other Ambulatory Visit: Payer: Self-pay | Admitting: *Deleted

## 2023-06-17 ENCOUNTER — Telehealth: Payer: Self-pay

## 2023-06-17 DIAGNOSIS — I70221 Atherosclerosis of native arteries of extremities with rest pain, right leg: Secondary | ICD-10-CM

## 2023-06-17 NOTE — Telephone Encounter (Signed)
 Attempted to call for surgery scheduling. LVM

## 2023-06-22 IMAGING — DX DG CHEST 2V
2 series · 2 of 2 positions shown · non-contrast
Comparison: Chest two views 07/20/2017 and CT chest 06/30/2019

CLINICAL DATA: Cough

EXAM:
CHEST - 2 VIEW

[chest pa]
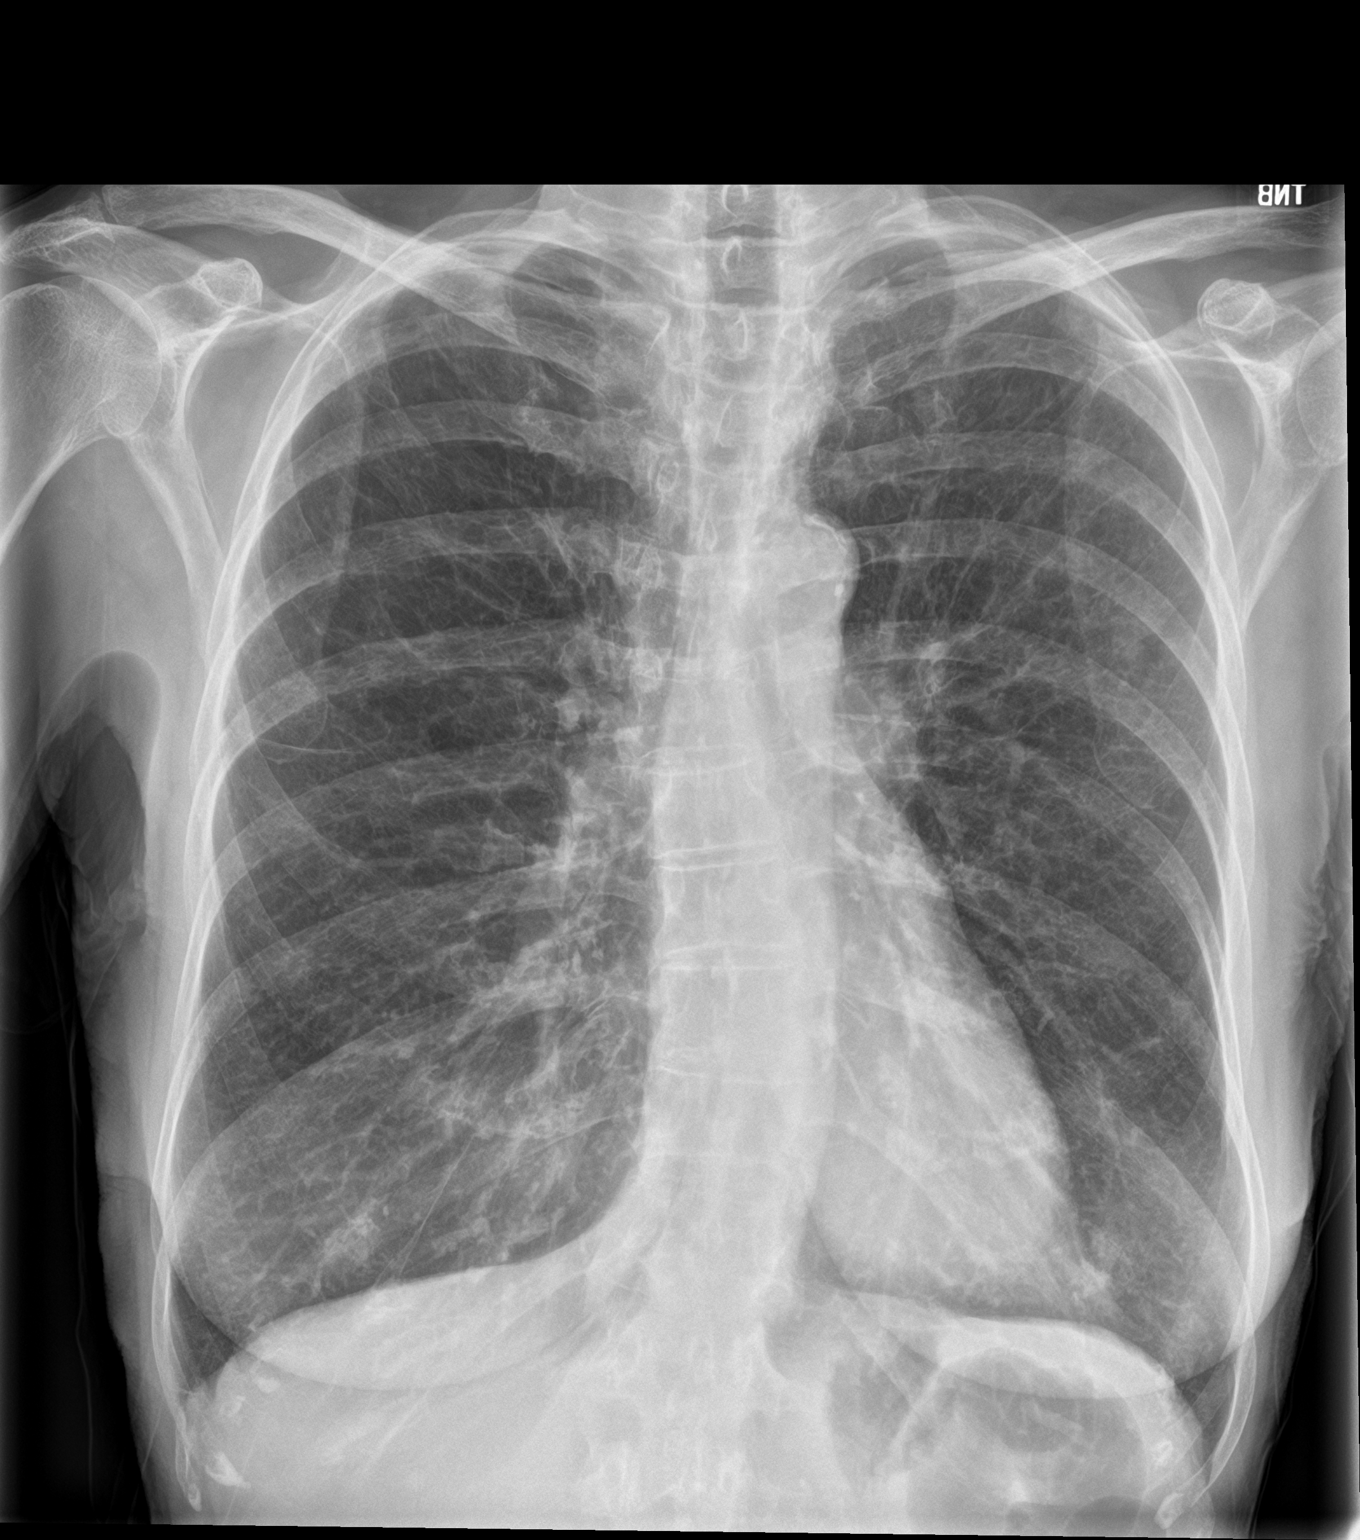

[chest lat]
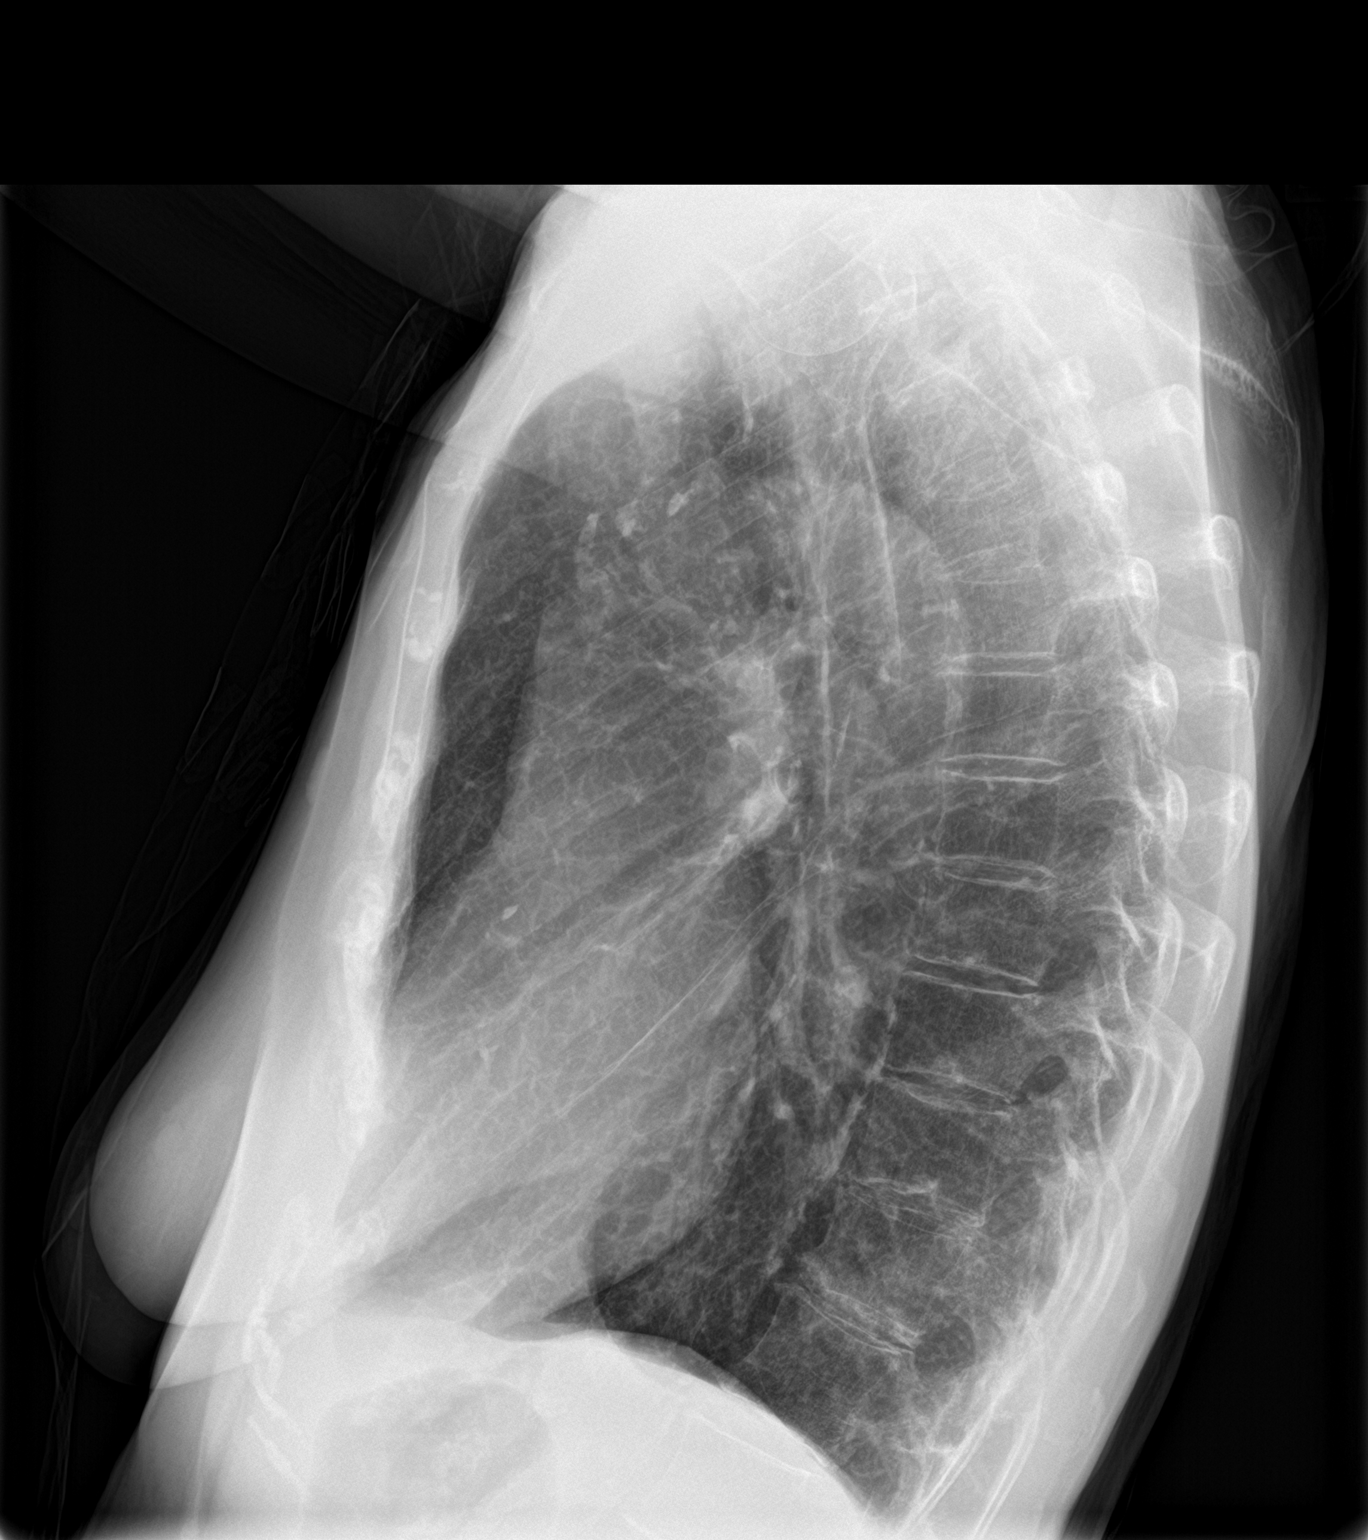

[2 of 2 positions shown; findings below may reference images not displayed]

FINDINGS: Cardiac silhouette and mediastinal contours are within normal
limits. Mild calcification is seen within the aortic arch. There is
again flattening of the diaphragms and high-grade hyperinflation.
Increased cystic lucencies within the upper lungs compatible with
emphysematous changes. Bilateral nipple shadows overlie the lower
lungs on frontal view only. No pleural effusion or pneumothorax. No
acute skeletal abnormality.
IMPRESSION: No active cardiopulmonary disease. Chronic hyperinflation and
emphysematous change, as seen on prior CT.

## 2023-06-28 ENCOUNTER — Encounter (HOSPITAL_COMMUNITY): Payer: Self-pay | Admitting: Surgery

## 2023-06-28 ENCOUNTER — Other Ambulatory Visit: Payer: Self-pay

## 2023-06-28 NOTE — Anesthesia Preprocedure Evaluation (Signed)
 Anesthesia Evaluation  Patient identified by MRN, date of birth, ID band Patient awake    Reviewed: Allergy & Precautions, NPO status , Patient's Chart, lab work & pertinent test results  History of Anesthesia Complications Negative for: history of anesthetic complications  Airway Mallampati: II  TM Distance: >3 FB Neck ROM: Full   Comment: Previous grade I view with MAC 3, easy mask with OPA Dental  (+) Edentulous Upper, Dental Advisory Given 6 teeth on the bottom. Denies them being loose.:   Pulmonary sleep apnea (patient reports this was a misdiagnosis) , COPD (used Trelegy this morning),  COPD inhaler, former smoker   Pulmonary exam normal breath sounds clear to auscultation       Cardiovascular hypertension (losartan), Pt. on medications (-) angina + Peripheral Vascular Disease (s/p multiple procedures)  (-) Past MI, (-) Cardiac Stents and (-) CABG (-) dysrhythmias  Rhythm:Regular Rate:Normal  HLD  TTE 06/03/2023: IMPRESSIONS    1. Left ventricular ejection fraction, by estimation, is 60 to 65%. The  left ventricle has normal function. The left ventricle has no regional  wall motion abnormalities. Left ventricular diastolic parameters are  consistent with Grade II diastolic  dysfunction (pseudonormalization).   2. Right ventricular systolic function is normal. The right ventricular  size is normal. There is mildly elevated pulmonary artery systolic  pressure.   3. The mitral valve is normal in structure. Trivial mitral valve  regurgitation. No evidence of mitral stenosis.   4. The aortic valve is tricuspid. Aortic valve regurgitation is not  visualized. No aortic stenosis is present.   5. The inferior vena cava is normal in size with greater than 50%  respiratory variability, suggesting right atrial pressure of 3 mmHg.     Neuro/Psych neg Seizures PSYCHIATRIC DISORDERS Anxiety Depression    TIA (06/02/2023)  Neuromuscular disease (chronic low back pain)    GI/Hepatic hiatal hernia, PUD,GERD  ,,(+)     substance abuse  marijuana useDiverticulosis    Endo/Other  negative endocrine ROS    Renal/GU negative Renal ROS     Musculoskeletal   Abdominal   Peds  Hematology negative hematology ROS (+) Lab Results      Component                Value               Date                      WBC                      12.7 (H)            06/08/2023                HGB                      10.5 (L)            06/08/2023                HCT                      33.5 (L)            06/08/2023                MCV  93.8                06/08/2023                PLT                      584 (H)             06/08/2023              Anesthesia Other Findings   Reproductive/Obstetrics                             Anesthesia Physical Anesthesia Plan  ASA: 4  Anesthesia Plan: General   Post-op Pain Management:    Induction: Intravenous  PONV Risk Score and Plan: 3 and Ondansetron, Dexamethasone and Treatment may vary due to age or medical condition  Airway Management Planned: LMA  Additional Equipment:   Intra-op Plan:   Post-operative Plan: Extubation in OR  Informed Consent: I have reviewed the patients History and Physical, chart, labs and discussed the procedure including the risks, benefits and alternatives for the proposed anesthesia with the patient or authorized representative who has indicated his/her understanding and acceptance.     Dental advisory given  Plan Discussed with: Anesthesiologist and CRNA  Anesthesia Plan Comments: (PAT note by Antionette Poles, PA-C: 34 old female with pertinent history including HTN, HLD, former smoker with associated COPD, GERD, OSA not on CPAP, hyponatremia, PAD s/p recent extensive vascular procedure on 05/27/2023 - aortobiiliac stent, right femoral endarterectomy with patch angioplasty, right iliofemoral and  profunda femoral thrombectomy, left femoral endarterectomy with patch, angioplasty of the bilateral external iliac arteries, right fem to below-knee popliteal artery bypass graft.  Following surgery she was discharged on 05/30/2023.  She presented back to the ED on 06/02/2023 for a wound check.  She was noted to have some slurred speech and tongue numbness and code stroke was called.  Per neurology notes, felt she likely had a TIA due to small vessel disease.  CT head with no acute abnormality.  CTA head and neck with no LVO.  MRI with no acute process.  Dopplers negative for DVT.  Echo showed EF 60 to 65%, grade 2 DD, no significant valve abnormalities.  EEG was suggestive of mild diffuse encephalopathy.  Recommended continue ASA and Plavix.  Vascular surgery recommended to hold Plavix 5 days preoperatively, continue ASA.  CMP and CBC from 06/08/2023 reviewed, mild hyponatremia sodium 134, mildly elevated AST and ALT at 57 and 64 respectively, stable anemia with hemoglobin 10.5, mild thrombocytosis with platelets 584.  She will need day of surgery evaluation.  TTE 06/03/2023: 1. Left ventricular ejection fraction, by estimation, is 60 to 65%. The  left ventricle has normal function. The left ventricle has no regional  wall motion abnormalities. Left ventricular diastolic parameters are  consistent with Grade II diastolic  dysfunction (pseudonormalization).  2. Right ventricular systolic function is normal. The right ventricular  size is normal. There is mildly elevated pulmonary artery systolic  pressure.  3. The mitral valve is normal in structure. Trivial mitral valve  regurgitation. No evidence of mitral stenosis.  4. The aortic valve is tricuspid. Aortic valve regurgitation is not  visualized. No aortic stenosis is present.  5. The inferior vena cava is normal in size with greater than 50%  respiratory variability, suggesting right atrial pressure of 3 mmHg.   )  Anesthesia  Quick Evaluation

## 2023-06-28 NOTE — Progress Notes (Addendum)
 SDW CALL  Patient was given pre-op instructions over the phone. The opportunity was given for the patient to ask questions. No further questions asked. Patient verbalized understanding of instructions given.   PCP - Dr. Aris Everts Cardiologist - denies  PPM/ICD - denies Device Orders - n/a Rep Notified - n/a  Chest x-ray - denies EKG - 06/02/23 Stress Test - denies ECHO - 06/03/23 Cardiac Cath - denies  Sleep Study - OSA+ but does not wear a CPAP or oxygen at night   No DM  Last dose of GLP1 agonist-  n/a GLP1 instructions: n/a  Blood Thinner Instructions:  patient states that she was instructed to stop her Aspirin and Plavix 5 days prior to her procedure - last dose was 2/14       -awaiting on confirmation from office that these were supposed to be held for 5 days     - Per Lelon Mast at Dr. Estanislado Spire office, patient is to continue to take Aspirin but not take Plavix. Returned call to patient and instructed her to take her Aspirin today and tomorrow.  Patient verbalizes understanding.   ERAS Protcol - NPO PRE-SURGERY Ensure or G2- n/a  COVID TEST- n/a   Anesthesia review: yes  Patient denies shortness of breath, fever, cough and chest pain over the phone call   All instructions explained to the patient, with a verbal understanding of the material. Patient agrees to go over the instructions while at home for a better understanding.

## 2023-06-28 NOTE — Progress Notes (Signed)
 Anesthesia Chart Review: Same day workup  41 old female with pertinent history including HTN, HLD, former smoker with associated COPD, GERD, OSA not on CPAP, hyponatremia, PAD s/p recent extensive vascular procedure on 05/27/2023 - aortobiiliac stent, right femoral endarterectomy with patch angioplasty, right iliofemoral and profunda femoral thrombectomy, left femoral endarterectomy with patch, angioplasty of the bilateral external iliac arteries, right fem to below-knee popliteal artery bypass graft.  Following surgery she was discharged on 05/30/2023.  She presented back to the ED on 06/02/2023 for a wound check.  She was noted to have some slurred speech and tongue numbness and code stroke was called.  Per neurology notes, felt she likely had a TIA due to small vessel disease.  CT head with no acute abnormality.  CTA head and neck with no LVO.  MRI with no acute process.  Dopplers negative for DVT.  Echo showed EF 60 to 65%, grade 2 DD, no significant valve abnormalities.  EEG was suggestive of mild diffuse encephalopathy.  Recommended continue ASA and Plavix.  Vascular surgery recommended to hold Plavix 5 days preoperatively, continue ASA.  CMP and CBC from 06/08/2023 reviewed, mild hyponatremia sodium 134, mildly elevated AST and ALT at 57 and 64 respectively, stable anemia with hemoglobin 10.5, mild thrombocytosis with platelets 584.  She will need day of surgery evaluation.  TTE 06/03/2023: 1. Left ventricular ejection fraction, by estimation, is 60 to 65%. The  left ventricle has normal function. The left ventricle has no regional  wall motion abnormalities. Left ventricular diastolic parameters are  consistent with Grade II diastolic  dysfunction (pseudonormalization).   2. Right ventricular systolic function is normal. The right ventricular  size is normal. There is mildly elevated pulmonary artery systolic  pressure.   3. The mitral valve is normal in structure. Trivial mitral valve   regurgitation. No evidence of mitral stenosis.   4. The aortic valve is tricuspid. Aortic valve regurgitation is not  visualized. No aortic stenosis is present.   5. The inferior vena cava is normal in size with greater than 50%  respiratory variability, suggesting right atrial pressure of 3 mmHg.     Zannie Cove [facility] Short Stay Center/Anesthesiology Phone [phone] 06/28/2023 2:26 PM

## 2023-06-29 ENCOUNTER — Inpatient Hospital Stay (HOSPITAL_COMMUNITY)
Admission: RE | Admit: 2023-06-29 | Discharge: 2023-06-30 | DRG: 905 | Disposition: A | Discharge status: Home Health | Payer: 59 | Attending: Surgery | Admitting: Surgery

## 2023-06-29 ENCOUNTER — Encounter (HOSPITAL_COMMUNITY)
Admission: RE | Disposition: A | Discharge status: Home Health | Payer: Self-pay | Source: Home / Self Care | Attending: Surgery

## 2023-06-29 ENCOUNTER — Ambulatory Visit (HOSPITAL_COMMUNITY): Payer: 59 | Admitting: Physician Assistant

## 2023-06-29 DIAGNOSIS — Y838 Other surgical procedures as the cause of abnormal reaction of the patient, or of later complication, without mention of misadventure at the time of the procedure: Secondary | ICD-10-CM | POA: Diagnosis present

## 2023-06-29 DIAGNOSIS — I97648 Postprocedural seroma of a circulatory system organ or structure following other circulatory system procedure: Secondary | ICD-10-CM | POA: Diagnosis not present

## 2023-06-29 DIAGNOSIS — Z7982 Long term (current) use of aspirin: Secondary | ICD-10-CM

## 2023-06-29 DIAGNOSIS — K219 Gastro-esophageal reflux disease without esophagitis: Secondary | ICD-10-CM | POA: Diagnosis present

## 2023-06-29 DIAGNOSIS — Z7902 Long term (current) use of antithrombotics/antiplatelets: Secondary | ICD-10-CM

## 2023-06-29 DIAGNOSIS — L7634 Postprocedural seroma of skin and subcutaneous tissue following other procedure: Principal | ICD-10-CM | POA: Diagnosis present

## 2023-06-29 DIAGNOSIS — J449 Chronic obstructive pulmonary disease, unspecified: Secondary | ICD-10-CM | POA: Diagnosis present

## 2023-06-29 DIAGNOSIS — I739 Peripheral vascular disease, unspecified: Secondary | ICD-10-CM | POA: Diagnosis present

## 2023-06-29 DIAGNOSIS — T8189XA Other complications of procedures, not elsewhere classified, initial encounter: Principal | ICD-10-CM | POA: Diagnosis present

## 2023-06-29 DIAGNOSIS — Z79899 Other long term (current) drug therapy: Secondary | ICD-10-CM | POA: Diagnosis not present

## 2023-06-29 DIAGNOSIS — S301XXA Contusion of abdominal wall, initial encounter: Secondary | ICD-10-CM | POA: Diagnosis not present

## 2023-06-29 DIAGNOSIS — I10 Essential (primary) hypertension: Secondary | ICD-10-CM

## 2023-06-29 DIAGNOSIS — Z87891 Personal history of nicotine dependence: Secondary | ICD-10-CM | POA: Diagnosis not present

## 2023-06-29 DIAGNOSIS — G4733 Obstructive sleep apnea (adult) (pediatric): Secondary | ICD-10-CM | POA: Diagnosis present

## 2023-06-29 DIAGNOSIS — I70221 Atherosclerosis of native arteries of extremities with rest pain, right leg: Secondary | ICD-10-CM

## 2023-06-29 HISTORY — PX: INCISION AND DRAINAGE OF WOUND: SHX1803

## 2023-06-29 LAB — SURGICAL PCR SCREEN
MRSA, PCR: NEGATIVE
Staphylococcus aureus: NEGATIVE

## 2023-06-29 LAB — CBC
HCT: 30.2 % — ABNORMAL LOW (ref 36.0–46.0)
Hemoglobin: 9.4 g/dL — ABNORMAL LOW (ref 12.0–15.0)
MCH: 28.2 pg (ref 26.0–34.0)
MCHC: 31.1 g/dL (ref 30.0–36.0)
MCV: 90.7 fL (ref 80.0–100.0)
Platelets: 325 10*3/uL (ref 150–400)
RBC: 3.33 MIL/uL — ABNORMAL LOW (ref 3.87–5.11)
RDW: 14.3 % (ref 11.5–15.5)
WBC: 7.2 10*3/uL (ref 4.0–10.5)
nRBC: 0 % (ref 0.0–0.2)

## 2023-06-29 LAB — CREATININE, SERUM
Creatinine, Ser: 0.69 mg/dL (ref 0.44–1.00)
GFR, Estimated: >60 mL/min (ref >60)

## 2023-06-29 SURGERY — IRRIGATION AND DEBRIDEMENT WOUND
Anesthesia: General | Laterality: Left

## 2023-06-29 MED ORDER — GUAIFENESIN-DM 100-10 MG/5ML PO SYRP
15.0000 mL | ORAL_SOLUTION | ORAL | Status: DC | PRN
  Administered 2023-06-30: 15 mL via ORAL
  Filled 2023-06-29: qty 15

## 2023-06-29 MED ORDER — PROPOFOL 10 MG/ML IV BOLUS
INTRAVENOUS | Status: AC
  Filled 2023-06-29: qty 20

## 2023-06-29 MED ORDER — CEFAZOLIN SODIUM-DEXTROSE 2-4 GM/100ML-% IV SOLN
2.0000 g | INTRAVENOUS | Status: AC
  Administered 2023-06-29: 2 g via INTRAVENOUS
  Filled 2023-06-29: qty 100

## 2023-06-29 MED ORDER — 0.9 % SODIUM CHLORIDE (POUR BTL) OPTIME
TOPICAL | Status: DC | PRN
  Administered 2023-06-29: 1000 mL

## 2023-06-29 MED ORDER — SODIUM CHLORIDE 0.9% FLUSH: 3.0000 mL | Freq: Two times a day (BID) | INTRAVENOUS | Status: DC

## 2023-06-29 MED ORDER — ORAL CARE MOUTH RINSE: 15.0000 mL | Freq: Once | OROMUCOSAL | Status: AC

## 2023-06-29 MED ORDER — AMISULPRIDE (ANTIEMETIC) 5 MG/2ML IV SOLN: 10.0000 mg | Freq: Once | INTRAVENOUS | Status: DC | PRN

## 2023-06-29 MED ORDER — SODIUM CHLORIDE 0.9 % IV SOLN: INTRAVENOUS | Status: AC

## 2023-06-29 MED ORDER — MAGNESIUM SULFATE 2 GM/50ML IV SOLN: 2.0000 g | Freq: Every day | INTRAVENOUS | Status: DC | PRN

## 2023-06-29 MED ORDER — ALUM & MAG HYDROXIDE-SIMETH 200-200-20 MG/5ML PO SUSP: 15.0000 mL | ORAL | Status: DC | PRN

## 2023-06-29 MED ORDER — ACETAMINOPHEN 650 MG RE SUPP: 325.0000 mg | RECTAL | Status: DC | PRN

## 2023-06-29 MED ORDER — PHENYLEPHRINE 80 MCG/ML (10ML) SYRINGE FOR IV PUSH (FOR BLOOD PRESSURE SUPPORT)
PREFILLED_SYRINGE | INTRAVENOUS | Status: AC
  Filled 2023-06-29: qty 10

## 2023-06-29 MED ORDER — DEXAMETHASONE SODIUM PHOSPHATE 10 MG/ML IJ SOLN
INTRAMUSCULAR | Status: DC | PRN
  Administered 2023-06-29: 10 mg via INTRAVENOUS

## 2023-06-29 MED ORDER — ONDANSETRON HCL 4 MG/2ML IJ SOLN
INTRAMUSCULAR | Status: AC
  Filled 2023-06-29: qty 2

## 2023-06-29 MED ORDER — SODIUM CHLORIDE 0.9 % IV SOLN: 500.0000 mL | Freq: Once | INTRAVENOUS | Status: DC | PRN

## 2023-06-29 MED ORDER — PANTOPRAZOLE SODIUM 40 MG PO TBEC
40.0000 mg | DELAYED_RELEASE_TABLET | Freq: Every day | ORAL | Status: DC
  Administered 2023-06-30: 40 mg via ORAL
  Filled 2023-06-29: qty 1

## 2023-06-29 MED ORDER — FENTANYL CITRATE (PF) 250 MCG/5ML IJ SOLN
INTRAMUSCULAR | Status: DC | PRN
  Administered 2023-06-29: 25 ug via INTRAVENOUS

## 2023-06-29 MED ORDER — SODIUM CHLORIDE 0.9 % IV SOLN: INTRAVENOUS | Status: DC

## 2023-06-29 MED ORDER — ACETAMINOPHEN 325 MG PO TABS: 325.0000 mg | ORAL_TABLET | ORAL | Status: DC | PRN

## 2023-06-29 MED ORDER — HYDRALAZINE HCL 20 MG/ML IJ SOLN: 5.0000 mg | INTRAMUSCULAR | Status: DC | PRN

## 2023-06-29 MED ORDER — OXYCODONE-ACETAMINOPHEN 5-325 MG PO TABS
1.0000 | ORAL_TABLET | ORAL | Status: DC | PRN
  Administered 2023-06-29 (×2): 1 via ORAL
  Administered 2023-06-29 – 2023-06-30 (×3): 2 via ORAL
  Filled 2023-06-29: qty 2
  Filled 2023-06-29: qty 1
  Filled 2023-06-29: qty 2
  Filled 2023-06-29: qty 1
  Filled 2023-06-29: qty 2

## 2023-06-29 MED ORDER — FENTANYL CITRATE (PF) 100 MCG/2ML IJ SOLN
25.0000 ug | INTRAMUSCULAR | Status: DC | PRN
  Administered 2023-06-29: 50 ug via INTRAVENOUS

## 2023-06-29 MED ORDER — DEXAMETHASONE SODIUM PHOSPHATE 10 MG/ML IJ SOLN
INTRAMUSCULAR | Status: AC
  Filled 2023-06-29: qty 1

## 2023-06-29 MED ORDER — LIDOCAINE 2% (20 MG/ML) 5 ML SYRINGE
INTRAMUSCULAR | Status: AC
  Filled 2023-06-29: qty 5

## 2023-06-29 MED ORDER — SODIUM CHLORIDE 0.9% FLUSH: 3.0000 mL | INTRAVENOUS | Status: DC | PRN

## 2023-06-29 MED ORDER — METOPROLOL TARTRATE 5 MG/5ML IV SOLN
2.0000 mg | INTRAVENOUS | Status: DC | PRN
Start: 2023-06-29 — End: 2023-06-30

## 2023-06-29 MED ORDER — HEPARIN SODIUM (PORCINE) 5000 UNIT/ML IJ SOLN
5000.0000 [IU] | Freq: Three times a day (TID) | INTRAMUSCULAR | Status: DC
  Administered 2023-06-29 – 2023-06-30 (×2): 5000 [IU] via SUBCUTANEOUS
  Filled 2023-06-29 (×2): qty 1

## 2023-06-29 MED ORDER — ROSUVASTATIN CALCIUM 20 MG PO TABS
20.0000 mg | ORAL_TABLET | Freq: Every day | ORAL | Status: DC
  Administered 2023-06-29: 20 mg via ORAL
  Filled 2023-06-29: qty 1

## 2023-06-29 MED ORDER — DOCUSATE SODIUM 100 MG PO CAPS
100.0000 mg | ORAL_CAPSULE | Freq: Every day | ORAL | Status: DC
  Administered 2023-06-30: 100 mg via ORAL
  Filled 2023-06-29: qty 1

## 2023-06-29 MED ORDER — GLYCOPYRROLATE 0.2 MG/ML IJ SOLN
INTRAMUSCULAR | Status: DC | PRN
  Administered 2023-06-29: .2 mg via INTRAVENOUS

## 2023-06-29 MED ORDER — VANCOMYCIN HCL 500 MG IV SOLR
INTRAVENOUS | Status: AC
  Filled 2023-06-29: qty 10

## 2023-06-29 MED ORDER — FENTANYL CITRATE (PF) 100 MCG/2ML IJ SOLN
INTRAMUSCULAR | Status: AC
  Filled 2023-06-29: qty 2

## 2023-06-29 MED ORDER — ALBUTEROL SULFATE (2.5 MG/3ML) 0.083% IN NEBU: 3.0000 mL | INHALATION_SOLUTION | Freq: Four times a day (QID) | RESPIRATORY_TRACT | Status: DC | PRN

## 2023-06-29 MED ORDER — LABETALOL HCL 5 MG/ML IV SOLN: 10.0000 mg | INTRAVENOUS | Status: DC | PRN

## 2023-06-29 MED ORDER — CHLORHEXIDINE GLUCONATE 0.12 % MT SOLN
15.0000 mL | Freq: Once | OROMUCOSAL | Status: AC
  Administered 2023-06-29: 15 mL via OROMUCOSAL
  Filled 2023-06-29: qty 15

## 2023-06-29 MED ORDER — OXYCODONE HCL 5 MG/5ML PO SOLN: 5.0000 mg | Freq: Once | ORAL | Status: DC | PRN

## 2023-06-29 MED ORDER — ONDANSETRON HCL 4 MG/2ML IJ SOLN: 4.0000 mg | Freq: Four times a day (QID) | INTRAMUSCULAR | Status: DC | PRN

## 2023-06-29 MED ORDER — GENTAMICIN SULFATE 40 MG/ML IJ SOLN
INTRAMUSCULAR | Status: AC
  Filled 2023-06-29: qty 4

## 2023-06-29 MED ORDER — CEFAZOLIN SODIUM-DEXTROSE 2-4 GM/100ML-% IV SOLN
2.0000 g | Freq: Three times a day (TID) | INTRAVENOUS | Status: AC
  Administered 2023-06-29 – 2023-06-30 (×2): 2 g via INTRAVENOUS
  Filled 2023-06-29 (×2): qty 100

## 2023-06-29 MED ORDER — MORPHINE SULFATE (PF) 2 MG/ML IV SOLN: 2.0000 mg | INTRAVENOUS | Status: DC | PRN

## 2023-06-29 MED ORDER — OXYCODONE HCL 5 MG PO TABS: 5.0000 mg | ORAL_TABLET | Freq: Once | ORAL | Status: DC | PRN

## 2023-06-29 MED ORDER — PROPOFOL 10 MG/ML IV BOLUS
INTRAVENOUS | Status: DC | PRN
  Administered 2023-06-29: 80 mg via INTRAVENOUS

## 2023-06-29 MED ORDER — PHENYLEPHRINE HCL-NACL 20-0.9 MG/250ML-% IV SOLN
INTRAVENOUS | Status: DC | PRN
  Administered 2023-06-29: 50 ug/min via INTRAVENOUS

## 2023-06-29 MED ORDER — GLYCOPYRROLATE PF 0.2 MG/ML IJ SOSY
PREFILLED_SYRINGE | INTRAMUSCULAR | Status: AC
  Filled 2023-06-29: qty 1

## 2023-06-29 MED ORDER — CHLORHEXIDINE GLUCONATE 4 % EX SOLN: 60.0000 mL | Freq: Once | CUTANEOUS | Status: DC

## 2023-06-29 MED ORDER — CLOPIDOGREL BISULFATE 75 MG PO TABS
75.0000 mg | ORAL_TABLET | Freq: Every day | ORAL | Status: DC
  Administered 2023-06-29 – 2023-06-30 (×2): 75 mg via ORAL
  Filled 2023-06-29 (×2): qty 1

## 2023-06-29 MED ORDER — CHLORHEXIDINE GLUCONATE 4 % EX SOLN
60.0000 mL | Freq: Once | CUTANEOUS | Status: DC
Start: 2023-06-29 — End: 2023-06-29

## 2023-06-29 MED ORDER — POTASSIUM CHLORIDE CRYS ER 20 MEQ PO TBCR: 20.0000 meq | EXTENDED_RELEASE_TABLET | Freq: Every day | ORAL | Status: DC | PRN

## 2023-06-29 MED ORDER — PHENOL 1.4 % MT LIQD: 1.0000 | OROMUCOSAL | Status: DC | PRN

## 2023-06-29 MED ORDER — PHENYLEPHRINE 80 MCG/ML (10ML) SYRINGE FOR IV PUSH (FOR BLOOD PRESSURE SUPPORT)
PREFILLED_SYRINGE | INTRAVENOUS | Status: DC | PRN
Start: 2023-06-29 — End: 2023-06-29
  Administered 2023-06-29: 160 ug via INTRAVENOUS
  Administered 2023-06-29: 240 ug via INTRAVENOUS
  Administered 2023-06-29 (×2): 160 ug via INTRAVENOUS
  Administered 2023-06-29: 240 ug via INTRAVENOUS

## 2023-06-29 MED ORDER — FENTANYL CITRATE (PF) 250 MCG/5ML IJ SOLN
INTRAMUSCULAR | Status: AC
  Filled 2023-06-29: qty 5

## 2023-06-29 MED ORDER — ASPIRIN 81 MG PO TBEC
81.0000 mg | DELAYED_RELEASE_TABLET | Freq: Every day | ORAL | Status: DC
  Administered 2023-06-30: 81 mg via ORAL
  Filled 2023-06-29: qty 1

## 2023-06-29 MED ORDER — LIDOCAINE 2% (20 MG/ML) 5 ML SYRINGE
INTRAMUSCULAR | Status: DC | PRN
  Administered 2023-06-29: 40 mg via INTRAVENOUS

## 2023-06-29 MED ORDER — ONDANSETRON HCL 4 MG/2ML IJ SOLN
INTRAMUSCULAR | Status: DC | PRN
Start: 2023-06-29 — End: 2023-06-29
  Administered 2023-06-29: 4 mg via INTRAVENOUS

## 2023-06-29 MED ORDER — MIRTAZAPINE 15 MG PO TABS
15.0000 mg | ORAL_TABLET | Freq: Every day | ORAL | Status: DC
  Administered 2023-06-29: 15 mg via ORAL
  Filled 2023-06-29: qty 1

## 2023-06-29 MED ORDER — ADULT MULTIVITAMIN W/MINERALS CH
1.0000 | ORAL_TABLET | Freq: Every day | ORAL | Status: DC
  Administered 2023-06-29 – 2023-06-30 (×2): 1 via ORAL
  Filled 2023-06-29 (×2): qty 1

## 2023-06-29 SURGICAL SUPPLY — 39 items
BAG COUNTER SPONGE SURGICOUNT (BAG) ×1 IMPLANT
BNDG ELASTIC 4X5.8 VLCR STR LF (GAUZE/BANDAGES/DRESSINGS) IMPLANT
BNDG ELASTIC 6X5.8 VLCR STR LF (GAUZE/BANDAGES/DRESSINGS) IMPLANT
BNDG GAUZE DERMACEA FLUFF 4 (GAUZE/BANDAGES/DRESSINGS) IMPLANT
CANISTER SUCT 3000ML PPV (MISCELLANEOUS) ×1 IMPLANT
CANISTER WOUNDNEG PRESSURE 500 (CANNISTER) IMPLANT
CLIP TI MEDIUM 6 (CLIP) ×1 IMPLANT
CLIP TI WIDE RED SMALL 6 (CLIP) ×1 IMPLANT
COVER SURGICAL LIGHT HANDLE (MISCELLANEOUS) ×1 IMPLANT
DRAPE DERMATAC (DRAPES) IMPLANT
DRAPE HALF SHEET 40X57 (DRAPES) IMPLANT
DRAPE INCISE IOBAN 66X45 STRL (DRAPES) IMPLANT
DRAPE U-SHAPE 76X120 STRL (DRAPES) IMPLANT
ELECT REM PT RETURN 9FT ADLT (ELECTROSURGICAL) ×1 IMPLANT
ELECTRODE REM PT RTRN 9FT ADLT (ELECTROSURGICAL) ×1 IMPLANT
GAUZE SPONGE 4X4 12PLY STRL (GAUZE/BANDAGES/DRESSINGS) ×1 IMPLANT
GAUZE XEROFORM 5X9 LF (GAUZE/BANDAGES/DRESSINGS) IMPLANT
GLOVE SURG SS PI 7.5 STRL IVOR (GLOVE) ×3 IMPLANT
GOWN STRL REUS W/ TWL LRG LVL3 (GOWN DISPOSABLE) ×2 IMPLANT
GOWN STRL REUS W/ TWL XL LVL3 (GOWN DISPOSABLE) ×1 IMPLANT
GRAFT SKIN WND MICRO 38 (Tissue) IMPLANT
IV NS IRRIG 3000ML ARTHROMATIC (IV SOLUTION) ×1 IMPLANT
KIT BASIN OR (CUSTOM PROCEDURE TRAY) ×1 IMPLANT
KIT TURNOVER KIT B (KITS) ×1 IMPLANT
NS IRRIG 1000ML POUR BTL (IV SOLUTION) ×1 IMPLANT
PACK CV ACCESS (CUSTOM PROCEDURE TRAY) IMPLANT
PACK GENERAL/GYN (CUSTOM PROCEDURE TRAY) ×1 IMPLANT
PACK UNIVERSAL I (CUSTOM PROCEDURE TRAY) ×1 IMPLANT
PAD ARMBOARD 7.5X6 YLW CONV (MISCELLANEOUS) ×2 IMPLANT
PULSAVAC PLUS IRRIG FAN TIP (DISPOSABLE) IMPLANT
SET HNDPC FAN SPRY TIP SCT (DISPOSABLE) IMPLANT
SUT ETHILON 3 0 PS 1 (SUTURE) IMPLANT
SUT VIC AB 2-0 CT1 TAPERPNT 27 (SUTURE) IMPLANT
SUT VIC AB 2-0 CTX 36 (SUTURE) IMPLANT
SUT VIC AB 3-0 SH 27X BRD (SUTURE) IMPLANT
SUT VICRYL 4-0 PS2 18IN ABS (SUTURE) IMPLANT
TIP FAN IRRIG PULSAVAC PLUS (DISPOSABLE) IMPLANT
TOWEL GREEN STERILE (TOWEL DISPOSABLE) ×1 IMPLANT
WATER STERILE IRR 1000ML POUR (IV SOLUTION) ×1 IMPLANT

## 2023-06-29 NOTE — Progress Notes (Signed)
 Patient arrived in the unit, Alert and O*4, vital signs taken, CCMD notified, orientation provided about the unit, Call bell in reach.   06/29/23 1348  Vitals  Temp 98.1 F (36.7 C)  Temp Source Oral  BP 121/82  MAP (mmHg) 91  BP Location Left Arm  BP Method Automatic  Patient Position (if appropriate) Lying  Pulse Rate 85  Pulse Rate Source Monitor  ECG Heart Rate 84  Resp 20  MEWS COLOR  MEWS Score Color Green  Oxygen Therapy  SpO2 98 %  O2 Device Nasal Cannula  O2 Flow Rate (L/min) 2 L/min  Pain Assessment  Pain Scale 0-10  Pain Score 6  Pain Type Surgical pain  Pain Location Groin  Pain Orientation Left  Pain Descriptors / Indicators Aching  Pain Frequency Constant  Pain Onset Gradual  Patients Stated Pain Goal 0  Pain Intervention(s) Elevated extremity;Repositioned  Multiple Pain Sites No  MEWS Score  MEWS Temp 0  MEWS Systolic 0  MEWS Pulse 0  MEWS RR 0  MEWS LOC 0  MEWS Score 0

## 2023-06-29 NOTE — TOC Initial Note (Signed)
 Transition of Care (TOC) - Initial/Assessment Note  Donn Pierini RN, BSN Transitions of Care Unit 4E- RN Case Manager See Treatment Team for direct phone #   Patient Details  Name: Lauren King MRN: [id number] Date of Birth: [birth year]  Transition of Care [facility]) CM/SW Contact:    Darrold Span, RN Phone Number: 06/29/2023, 2:04 PM  Clinical Narrative:                 Received call from [facility] PA- M. Eveland regarding home VAC needs for discharge. Order form provided for signature as pt still in PACU currently. [employer] signed order faxed to [employer] liaison to start insurance auth for home [facility] needs.   Per M. Eveland- plan will be for pt to have HHRN start drsg changes 1 week from today.  CM had received notice from [facility] liaison that pt was on list for [facility] needs for [facility] office referral. Liaison to check and confirm if they can provide needed services for home VAC drsg changes- with start of care plan for next week.   TOC will continue to follow.   Expected Discharge Plan: Home w Home Health Services Barriers to Discharge: Continued Medical Work up, English as a second language teacher   Patient Goals and CMS Choice Patient states their goals for this hospitalization and ongoing recovery are:: return home          Expected Discharge Plan and Services   Discharge Planning Services: CM Consult Post Acute Care Choice: Durable Medical Equipment, Home Health Living arrangements for the past 2 months: Apartment                 DME Arranged: Vac DME Agency: [employer] Date DME Agency Contacted: 06/29/23 Time DME Agency Contacted: 1300 Representative spoke with at DME Agency: French Ana HH Arranged: RN HH Agency: [facility] Date HH Agency Contacted: 06/29/23 Time HH Agency Contacted: 1404 Representative spoke with at [facility] Agency: Aggie Cosier  Prior Living Arrangements/Services Living arrangements for the past 2 months: Apartment Lives with:: Self Patient language and need for  interpreter reviewed:: Yes Do you feel safe going back to the place where you live?: Yes      Need for Family Participation in Patient Care: Yes (Comment)   Current home services: DME (wheelchair) Criminal Activity/Legal Involvement Pertinent to Current Situation/Hospitalization: No - Comment as needed  Activities of Daily Living   ADL Screening (condition at time of admission) Independently performs ADLs?: Yes (appropriate for developmental age) Is the patient deaf or have difficulty hearing?: No Does the patient have difficulty seeing, even when wearing glasses/contacts?: No Does the patient have difficulty concentrating, remembering, or making decisions?: No  Permission Sought/Granted Permission sought to share information with : Facility Industrial/product designer granted to share information with : Yes, Verbal Permission Granted     Permission granted to share info w AGENCY: HH/DME        Emotional Assessment       Orientation: : Oriented to Self, Oriented to Place, Oriented to  Time, Oriented to Situation Alcohol / Substance Use: Not Applicable Psych Involvement: No (comment)  Admission diagnosis:  Non-healing surgical wound [T81.89XA] Patient Active Problem List   Diagnosis Date Noted   Non-healing surgical wound 06/29/2023   TIA (transient ischemic attack) 06/02/2023   HTN (hypertension) 06/02/2023   HLD (hyperlipidemia) 06/02/2023   OSA (obstructive sleep apnea) 06/02/2023   Critical limb ischemia of right lower extremity (HCC) 05/27/2023   Aortoiliac occlusive disease (HCC) 05/27/2023   COPD (chronic  obstructive pulmonary disease) (HCC) 01/05/2022   Delusions (HCC) 01/03/2022   Hyponatremia 01/03/2022   Urinary tract infection in elderly patient 01/03/2022   PAD (peripheral artery disease) (HCC) 12/22/2018   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/11/2018   Major depressive disorder, single episode 03/22/2017    Diverticulosis of colon without hemorrhage    Reflux esophagitis    Schatzki's ring    Gastric ulceration    Hiatal hernia    Encounter for screening colonoscopy 02/06/2015   Constipation 01/02/2015   Esophageal dysphagia 09/21/2012   GERD (gastroesophageal reflux disease) 09/21/2012   Loss of weight 09/21/2012   PCP:  Aris Everts, MD Pharmacy:   [facility] - [place], [place] - [address] [place] [address] Phone: [phone] Fax: [phone]  [facility] - [place], [place] - [address] [place] [address] Phone: [phone] Fax: [phone]     Social Drivers of Health (SDOH) Social History: SDOH Screenings   Food Insecurity: No Food Insecurity (05/28/2023)  Housing: Unknown (05/28/2023)  Transportation Needs: No Transportation Needs (05/28/2023)  Utilities: Not At Risk (05/28/2023)  Social Connections: Patient Declined (05/28/2023)  Tobacco Use: Medium Risk (06/28/2023)   SDOH Interventions:     Readmission Risk Interventions    05/30/2023    2:19 PM  Readmission Risk Prevention Plan  Post Dischage Appt Complete  Medication Screening Complete  Transportation Screening Complete

## 2023-06-29 NOTE — Anesthesia Procedure Notes (Signed)
 Procedure Name: LMA Insertion Date/Time: 06/29/2023 12:10 PM  Performed by: Georgianne Fick D, CRNAPre-anesthesia Checklist: Patient identified, Emergency Drugs available, Suction available and Patient being monitored Patient Re-evaluated:Patient Re-evaluated prior to induction Oxygen Delivery Method: Circle System Utilized Preoxygenation: Pre-oxygenation with 100% oxygen Induction Type: IV induction Ventilation: Mask ventilation without difficulty LMA: LMA inserted LMA Size: 3.0 Number of attempts: 1 Airway Equipment and Method: Bite block Placement Confirmation: positive ETCO2 Tube secured with: Tape Dental Injury: Teeth and Oropharynx as per pre-operative assessment

## 2023-06-29 NOTE — Transfer of Care (Signed)
 Immediate Anesthesia Transfer of Care Note  Patient: Lauren King  Procedure(s) Performed: LEFT GROIN IRRIGATION AND DEBRIDEMENT WOUND (Left)  Patient Location: PACU  Anesthesia Type:General  Level of Consciousness: awake, alert , and oriented  Airway & Oxygen Therapy: Patient Spontanous Breathing and Patient connected to face mask oxygen  Post-op Assessment: Report given to RN and Post -op Vital signs reviewed and stable  Post vital signs: Reviewed and stable  Last Vitals:  Vitals Value Taken Time  BP 128/62 06/29/23 1300  Temp    Pulse 78 06/29/23 1302  Resp 11 06/29/23 1302  SpO2 99 % 06/29/23 1302  Vitals shown include unfiled device data.  Last Pain:  Vitals:   06/29/23 1014  TempSrc:   PainSc: 8          Complications: No notable events documented.

## 2023-06-29 NOTE — Op Note (Signed)
 DATE OF SERVICE: 06/29/2023  PATIENT:  Lauren King  73 y.o. female  PRE-OPERATIVE DIAGNOSIS:  left groin serous drainage  POST-OPERATIVE DIAGNOSIS:  Same  PROCEDURE:   1) incision and drainage of left groin wound 2) application of skin substitute to left groin (38 sq cm Kerecis) 3) application of negative pressure wound therapy (6 x 1 x 2 cm wound volume)  SURGEON:  Surgeons and Role:    * Leonie Douglas, MD - Primary  ASSISTANT: none  ANESTHESIA:   general  EBL: minimal  BLOOD ADMINISTERED:none  DRAINS: VAC to groin  LOCAL MEDICATIONS USED:  NONE  SPECIMEN:  culture / gram stain of seroma fluid  COUNTS: confirmed correct.  TOURNIQUET:  none  PATIENT DISPOSITION:  PACU - hemodynamically stable.   Delay start of Pharmacological VTE agent (>24hrs) due to surgical blood loss or risk of bleeding: no  INDICATION FOR PROCEDURE: CINTYA DAUGHETY is a 73 y.o. female with left groin lymph leak after multilevel hybrid revascularization. After careful discussion of risks, benefits, and alternatives the patient was offered incision and drainage. The patient understood and wished to proceed.  OPERATIVE FINDINGS: clear serous fluid encountered in groin. Small seroma cavity in the inferior aspect of the wound. Culture / GS taken. Able to close subcutaneous fat so no vascular structures were exposed. Kerecis applied to wound bed and seroma cavity.  DESCRIPTION OF PROCEDURE: After identification of the patient in the pre-operative holding area, the patient was transferred to the operating room. The patient was positioned supine on the operating room table. Anesthesia was induced. The left groin was prepped and draped in standard fashion. A surgical pause was performed confirming correct patient, procedure, and operative location.  The left groin was reopened sharply with Metzenbaum scissors.  A small seroma was encountered.  The wound was cultured.  The subcutaneous fat layer was  closed with 2-0 Vicryl.  A small black VAC sponge was cut to fit the wound and secured to the skin with derma tack dressing.  The final wound volume was 6 x 1 x 2 cm.  The wound was connected to suction and good seal was achieved.  Upon completion of the case instrument and sharps counts were confirmed correct. The patient was transferred to the PACU in good condition. I was present for all portions of the procedure.  Rande Brunt. Lenell Antu, MD [facility] Phone Number: [phone] 06/29/2023 12:51 PM

## 2023-06-29 NOTE — Interval H&P Note (Signed)
 History and Physical Interval Note:  06/29/2023 11:47 AM  Lauren King  has presented today for surgery, with the diagnosis of Seroma of right groin.  The various methods of treatment have been discussed with the patient and family. After consideration of risks, benefits and other options for treatment, the patient has consented to  Procedure(s): LEFT GROIN IRRIGATION AND DEBRIDEMENT WOUND (Left) as a surgical intervention.  The patient's history has been reviewed, patient examined, no change in status, stable for surgery.  I have reviewed the patient's chart and labs.  Questions were answered to the patient's satisfaction.     Leonie Douglas

## 2023-06-29 NOTE — Anesthesia Postprocedure Evaluation (Signed)
 Anesthesia Post Note  Patient: Lauren King  Procedure(s) Performed: LEFT GROIN IRRIGATION AND DEBRIDEMENT WOUND (Left)     Patient location during evaluation: PACU Anesthesia Type: General Level of consciousness: awake Pain management: pain level controlled Vital Signs Assessment: post-procedure vital signs reviewed and stable Respiratory status: spontaneous breathing, nonlabored ventilation and respiratory function stable Cardiovascular status: blood pressure returned to baseline and stable Postop Assessment: no apparent nausea or vomiting Anesthetic complications: no   No notable events documented.  Last Vitals:  Vitals:   06/29/23 1348 06/29/23 1445  BP: 121/82 (!) 139/93  Pulse: 85   Resp: 20 20  Temp: 36.7 C   SpO2: 98% 98%    Last Pain:  Vitals:   06/29/23 1445  TempSrc:   PainSc: 6                  Linton Rump

## 2023-06-30 ENCOUNTER — Encounter (HOSPITAL_COMMUNITY): Payer: Self-pay | Admitting: Vascular Surgery

## 2023-06-30 ENCOUNTER — Other Ambulatory Visit: Payer: Self-pay

## 2023-06-30 LAB — CBC
HCT: 29.4 % — ABNORMAL LOW (ref 36.0–46.0)
Hemoglobin: 9.2 g/dL — ABNORMAL LOW (ref 12.0–15.0)
MCH: 28.6 pg (ref 26.0–34.0)
MCHC: 31.3 g/dL (ref 30.0–36.0)
MCV: 91.3 fL (ref 80.0–100.0)
Platelets: 303 10*3/uL (ref 150–400)
RBC: 3.22 MIL/uL — ABNORMAL LOW (ref 3.87–5.11)
RDW: 14 % (ref 11.5–15.5)
WBC: 6.3 10*3/uL (ref 4.0–10.5)
nRBC: 0 % (ref 0.0–0.2)

## 2023-06-30 LAB — BASIC METABOLIC PANEL
Anion gap: 9 (ref 5–15)
BUN: 11 mg/dL (ref 8–23)
CO2: 24 mmol/L (ref 22–32)
Calcium: 8.2 mg/dL — ABNORMAL LOW (ref 8.9–10.3)
Chloride: 104 mmol/L (ref 98–111)
Creatinine, Ser: 0.74 mg/dL (ref 0.44–1.00)
GFR, Estimated: >60 mL/min (ref >60)
Glucose, Bld: 150 mg/dL — ABNORMAL HIGH (ref 70–99)
Potassium: 4.4 mmol/L (ref 3.5–5.1)
Sodium: 137 mmol/L (ref 135–145)

## 2023-06-30 LAB — POCT I-STAT, CHEM 8
BUN: 12 mg/dL (ref 8–23)
Calcium, Ion: 1.02 mmol/L — ABNORMAL LOW (ref 1.15–1.40)
Chloride: 103 mmol/L (ref 98–111)
Creatinine, Ser: 0.5 mg/dL (ref 0.44–1.00)
Glucose, Bld: 109 mg/dL — ABNORMAL HIGH (ref 70–99)
HCT: 35 % — ABNORMAL LOW (ref 36.0–46.0)
Hemoglobin: 11.9 g/dL — ABNORMAL LOW (ref 12.0–15.0)
Potassium: 4 mmol/L (ref 3.5–5.1)
Sodium: 136 mmol/L (ref 135–145)
TCO2: 25 mmol/L (ref 22–32)

## 2023-06-30 MED ORDER — CEPHALEXIN 500 MG PO CAPS
500.0000 mg | ORAL_CAPSULE | Freq: Two times a day (BID) | ORAL | Status: DC
  Administered 2023-06-30: 500 mg via ORAL
  Filled 2023-06-30: qty 1

## 2023-06-30 MED ORDER — CEPHALEXIN 500 MG PO CAPS: 500.0000 mg | ORAL_CAPSULE | Freq: Two times a day (BID) | ORAL | 0 refills | Status: DC

## 2023-06-30 NOTE — Progress Notes (Signed)
 [facility] [place]  Subjective  - Doing well   Objective 135/60 98 97.9 F (36.6 C) (Oral) 14 92%  Intake/Output Summary (Last 24 hours) at 06/30/2023 0857 Last data filed at 06/30/2023 5621 Gross per 24 hour  Intake 1290 ml  Output 500 ml  Net 790 ml    Vac with good suction left groin Doppler PT/DP/peroneal right , left PT signal Lungs non labored breathing   Assessment/Planning: POD # 1 PROCEDURE:   1) incision and drainage of left groin wound 2) application of skin substitute to left groin (38 sq cm Kerecis) 3) application of negative pressure wound therapy (6 x 1 x 2 cm wound volume)  Pending vac for home possible discharge [facility] RN for vac changes Next vac change 7 days from 07/06/23.     Lauren King 06/30/2023 8:57 AM --  Laboratory Lab Results: Recent Labs    06/29/23 1418 06/30/23 0241  WBC 7.2 6.3  HGB 9.4* 9.2*  HCT 30.2* 29.4*  PLT 325 303   BMET Recent Labs    06/29/23 1000 06/29/23 1418 06/30/23 0241  NA 136  --  137  K 4.0  --  4.4  CL 103  --  104  CO2  --   --  24  GLUCOSE 109*  --  150*  BUN 12  --  11  CREATININE 0.50 0.69 0.74  CALCIUM  --   --  8.2*    COAG Lab Results  Component Value Date   INR 1.0 06/02/2023   INR 1.0 05/19/2023   INR 1.0 12/19/2018   No results found for: "PTT"

## 2023-06-30 NOTE — Plan of Care (Signed)

## 2023-06-30 NOTE — TOC Transition Note (Signed)
 Transition of Care (TOC) - Discharge Note Donn Pierini RN, BSN Transitions of Care Unit 4E- RN Case Manager See Treatment Team for direct phone #   Patient Details  Name: Lauren King MRN: [id number] Date of Birth: 12/07/1950  Transition of Care [facility]) CM/SW Contact:  Darrold Span, RN Phone Number: 06/30/2023, 2:13 PM   Clinical Narrative:    Pt stable for transition home today,  Home wound VAC has been approved and POD sent to this writer for [facility] delivery.   CM has delivered home VAC to the bedside- bedside RN aware. Pt signed POD and CM has faxed signed POD back to [facility] liaison.  Home VAC and supplies at bedside for home along with original paperwork, copies of POD placed in chart.   Cm has confirmed [facility] referral has been accepted by [facility]- for start of care next week- per VVS note next VAC change in 7 days- 2/26. [facility] liaison to follow up and schedule. Pt has been informed that should she have any trouble with VAC prior to [facility] visit she should contact Dr. Estanislado Spire office- pt voiced understanding.   Pt's daughter to come transport home.   No further TOC needs noted.    Final next level of care: Home w Home Health Services Barriers to Discharge: Barriers Resolved   Patient Goals and CMS Choice Patient states their goals for this hospitalization and ongoing recovery are:: return home          Discharge Placement                 Home w/ [facility]      Discharge Plan and Services Additional resources added to the After Visit Summary for     Discharge Planning Services: CM Consult Post Acute Care Choice: Durable Medical Equipment, Home Health          DME Arranged: Vac DME Agency: [facility] Date DME Agency Contacted: 06/29/23 Time DME Agency Contacted: 1300 Representative spoke with at DME Agency: French [facility] HH Arranged: RN HH Agency: [facility] Date HH Agency Contacted: 06/29/23 Time HH Agency Contacted: 1404 Representative spoke  with at [facility] Agency: Aggie Cosier  Social Drivers of Health (SDOH) Interventions SDOH Screenings   Food Insecurity: No Food Insecurity (06/29/2023)  Housing: Unknown (06/29/2023)  Transportation Needs: No Transportation Needs (06/29/2023)  Utilities: Not At Risk (06/29/2023)  Social Connections: Patient Declined (06/30/2023)  Tobacco Use: Medium Risk (06/28/2023)     Readmission Risk Interventions    06/30/2023    2:13 PM 05/30/2023    2:19 PM  Readmission Risk Prevention Plan  Post Dischage Appt  Complete  Medication Screening  Complete  Transportation Screening  Complete  Home Care Screening Complete   Medication Review (RN CM) Complete

## 2023-06-30 NOTE — Progress Notes (Signed)
 Patient given discharge instructions. New wound vac switched out / applied. Patient was taught how to use new equipment and stated she understood instructions. Patient removed PIV herself. Telemetry box removed, CCMD notified.

## 2023-06-30 NOTE — Discharge Instructions (Signed)
 Wound vac for 1 week the 3 times a week change.

## 2023-06-30 NOTE — Progress Notes (Signed)
 Mobility Specialist Progress Note:    06/30/23 1050  Mobility  Activity Transferred from bed to chair  Level of Assistance Standby assist, set-up cues, supervision of patient - no hands on  Assistive Device Front wheel walker  Distance Ambulated (ft) 5 ft  Activity Response Tolerated well  Mobility Referral Yes  Mobility visit 1 Mobility  Mobility Specialist Start Time (ACUTE ONLY) 1040  Mobility Specialist Stop Time (ACUTE ONLY) 1050  Mobility Specialist Time Calculation (min) (ACUTE ONLY) 10 min   Pt received in bed, agreeable to transfer to chair. SB with RW during session. Tolerated well, asx throughout. Left with all needs met, call bell in reach.    Lauren King Mobility Specialist Please contact via Special educational needs teacher or  Rehab office at [phone]

## 2023-07-01 ENCOUNTER — Other Ambulatory Visit: Payer: Self-pay | Admitting: Physician Assistant

## 2023-07-01 ENCOUNTER — Other Ambulatory Visit: Payer: Self-pay

## 2023-07-01 MED ORDER — CLOPIDOGREL BISULFATE 75 MG PO TABS: 75.0000 mg | ORAL_TABLET | Freq: Every day | ORAL | 6 refills | Status: AC

## 2023-07-02 LAB — AEROBIC CULTURE W GRAM STAIN (SUPERFICIAL SPECIMEN): Culture: NO GROWTH

## 2023-07-04 NOTE — Discharge Summary (Addendum)
 [facility] Discharge Summary   Patient ID:  Lauren King MRN: [id number] DOB/AGE: March 06, 1951 73 y.o.  Admit date: 06/29/2023 Discharge date: 06/30/23 Date of Surgery: 06/29/2023 Surgeon: Surgeon(s): Leonie Douglas, MD  Admission Diagnosis: Non-healing surgical wound [T81.89XA]  Discharge Diagnoses:  Non-healing surgical wound [T81.89XA]  Secondary Diagnoses: Past Medical History:  Diagnosis Date   Anxiety    Chronic lower back pain    COPD (chronic obstructive pulmonary disease) (HCC)    Depression    GERD (gastroesophageal reflux disease)    Hypertension    Obstructive sleep apnea    CPAP   Peripheral vascular disease (HCC)     Procedure(s): LEFT GROIN IRRIGATION AND DEBRIDEMENT WOUND  Discharged Condition: stable  HPI:  Lauren King is a 73 y.o. female with left groin lymph leak after multilevel hybrid revascularization.     This is a 73 y.o. female who is s/p  #1: Endovascular aortobiiliac stent (CPT [phone]) using Endologix AFX 22 40-13-40 #2: Right femoral endarterectomy with bovine pericardial patch angioplasty #3: Right iliofemoral and profundofemoral thrombectomy #4: Left femoral exposure with left femoral endarterectomy and bovine pericardial patch angioplasty #5: Angioplasty, left and right external iliac artery #6: Right femoral to below-knee popliteal artery bypass graft with ipsilateral translocated nonreversed saphenous vein #7: Placement of Kerecis skin substitute to bilateral femoral incisions    on 05/30/2023 by Dr. Myra Gianotti.     Hospital Course:  Lauren King is a 73 y.o. female is S/P  Procedure(s): LEFT GROIN IRRIGATION AND DEBRIDEMENT WOUND The final wound volume was 6 x 1 x 2 cm. The wound was connected to suction and good seal was achieved.  Doppler PT/DP/peroneal right , left PT signal.  The vac will remain in place for 7 days then 3 times a week vac changes until the wound is healed. No bacterial cultures grew.   HH RN set up prior to discharge.  F/U 1-2 weeks was scheduled.       Significant Diagnostic Studies: CBC Lab Results  Component Value Date   WBC 6.3 06/30/2023   HGB 9.2 (L) 06/30/2023   HCT 29.4 (L) 06/30/2023   MCV 91.3 06/30/2023   PLT 303 06/30/2023    BMET    Component Value Date/Time   NA 137 06/30/2023 0241   K 4.4 06/30/2023 0241   CL 104 06/30/2023 0241   CO2 24 06/30/2023 0241   GLUCOSE 150 (H) 06/30/2023 0241   BUN 11 06/30/2023 0241   CREATININE 0.74 06/30/2023 0241   CREATININE 0.62 08/28/2012 0000   CALCIUM 8.2 (L) 06/30/2023 0241   CALCIUM 8.5 08/28/2012 0000   GFRNONAA >60 06/30/2023 0241   GFRAA >60 12/23/2018 0332   COAG Lab Results  Component Value Date   INR 1.0 06/02/2023   INR 1.0 05/19/2023   INR 1.0 12/19/2018     Disposition:  Discharge to :Home Discharge Instructions     Call MD for:  redness, tenderness, or signs of infection (pain, swelling, bleeding, redness, odor or green/yellow discharge around incision site)   Complete by: As directed    Call MD for:  severe or increased pain, loss or decreased feeling  in affected limb(s)   Complete by: As directed    Call MD for:  temperature >100.5   Complete by: As directed    Discharge instructions   Complete by: As directed    Keep left groin vac dressing clean and dry.  It should be changed in 1 weeks  Resume previous diet   Complete by: As directed       Allergies as of 06/30/2023   No Known Allergies      Medication List     TAKE these medications    albuterol 108 (90 Base) MCG/ACT inhaler Commonly known as: VENTOLIN HFA Inhale 1-2 puffs into the lungs every 6 (six) hours as needed for wheezing or shortness of breath.   aspirin EC 81 MG tablet Take 81 mg by mouth daily.   cephALEXin 500 MG capsule Commonly known as: KEFLEX Take 1 capsule (500 mg total) by mouth 2 (two) times daily. What changed: when to take this   losartan 50 MG tablet Commonly known as:  COZAAR Take 50 mg by mouth daily.   mirtazapine 15 MG tablet Commonly known as: REMERON Take 15 mg by mouth at bedtime.   multivitamin with minerals tablet Take 1 tablet by mouth daily.   Oxycodone HCl 10 MG Tabs Take 10 mg by mouth every 4 (four) hours as needed for severe pain.   rosuvastatin 20 MG tablet Commonly known as: CRESTOR Take 20 mg by mouth at bedtime.   Trelegy Ellipta 100-62.5-25 MCG/INH Aepb Generic drug: Fluticasone-Umeclidin-Vilant Inhale 1 puff into the lungs daily.       Verbal and written Discharge instructions given to the patient. Wound care per Discharge AVS  Follow-up Information     [facility] home VAC Follow up.   Why: General Questions: [phone] Order Supplies: [id number], option 2, then dial ext. [phone] 24/7 Technical Support: [phone], option 3. Help with V.A.C. Therapy unit such as changing  full canister, charging the unit, therapy alarms, troubleshooting dressing applications or questions  regarding safety. Reach out to your health care provider managing your V.A.C. Therapy for any  questions directly related to your therapy Contact information: If unit alarms or if experiencing issues to contact the home health, wound  care clinic, or physician's office that is managing the V.A.C. Therapy.  If unsuccessful, call [facility] Technical Support at [phone], option 3.        Nada Libman, MD Follow up in 2 week(s).   Specialties: Vascular Surgery, Cardiology Why: Office will call you to arrange your appt (sent) Contact information: [address] [place] [address] [phone]         [facility] Follow up.   Why: HHRN arranged- they will contact you to schedule next week for first wound VAC change 2/26- if you have any issues with the wound VAC in the mean time- conact Dr. Coralee Pesa office Contact information: [address] [place] [address] [phone]                  Signed: Mosetta Pigeon 07/04/2023, 8:04 AM

## 2023-07-05 ENCOUNTER — Telehealth: Payer: Self-pay

## 2023-07-05 NOTE — Telephone Encounter (Signed)
 Triage:  -received messaged Dr. Chestine Spore that pt's wound vac needed to be replaced in office. Wound vac lost seal and was instructed to a W-T-D dressing and come in on Monday.  -called pt who states she does not have transportation, that the nurse is coming on Wednesday from [facility].   -Called [facility] representative who states when [facility] was set up it was agreed that it would start on Wednesday d/t staffing.  Attempted to get RN to house earlier but unable.  Will keep Wednesday schedule.   -pt aware of dressing changes and [facility] RN appt for Wednesday

## 2023-07-11 ENCOUNTER — Encounter: Payer: Medicare HMO | Admitting: Surgery

## 2023-07-12 ENCOUNTER — Ambulatory Visit (INDEPENDENT_AMBULATORY_CARE_PROVIDER_SITE_OTHER): Payer: 59

## 2023-07-12 VITALS — BP 136/80 | HR 87 | Temp 98.6°F | Resp 18 | Ht 62.0 in | Wt 77.0 lb

## 2023-07-12 DIAGNOSIS — I70213 Atherosclerosis of native arteries of extremities with intermittent claudication, bilateral legs: Secondary | ICD-10-CM

## 2023-07-12 DIAGNOSIS — I899 Noninfective disorder of lymphatic vessels and lymph nodes, unspecified: Secondary | ICD-10-CM

## 2023-07-12 DIAGNOSIS — I739 Peripheral vascular disease, unspecified: Secondary | ICD-10-CM

## 2023-07-12 MED ORDER — GABAPENTIN 100 MG PO CAPS: 100.0000 mg | ORAL_CAPSULE | Freq: Two times a day (BID) | ORAL | 0 refills | Status: DC

## 2023-07-12 MED ORDER — BENZONATATE 100 MG PO CAPS: 100.0000 mg | ORAL_CAPSULE | Freq: Three times a day (TID) | ORAL | 0 refills | Status: DC | PRN

## 2023-07-12 NOTE — Progress Notes (Signed)
 POST OPERATIVE OFFICE NOTE    CC:  F/u for surgery  HPI:  This is a 73 y.o. female who is s/p 1) incision and drainage of left groin wound 2) application of skin substitute to left groin (38 sq cm Kerecis) 3) application of negative pressure wound therapy (6 x 1 x 2 cm wound volume) on 06/29/23 by Dr. Lenell Antu. She had developed a left groin lymph leak after multilevel revascularization and was therefore indicated for washout of the left groin. She tolerated this well and was discharged home POD#1.   Pt returns today for follow up incision check with her daughter.  Pt states that the VAC lost it suction on 07/05/23. Unfortunately due to transportation limitations she was not able to be seen until today. HH RN has come out 2x last week to dress wound. The daughter is CMA so she has been doing the daily wet to dry dressing changes. She has been washing her incisions with mild soap and water. The drainage has decreased significantly.  She says otherwise her legs are doing okay. She is mostly bothered by numbness and burning in her toes and feet. She says she is unable to afford Lyrica since her insurance does not cover it. She has very extensive vascular history as listed below. She is medically managed on Aspirin, Plavix, Statin   Surgical history: 12/2017: Aortogram, BLE arteriogram by Dr. Myra Gianotti 11/21/2018: Aortogram BLE arteriogram by Dr. Myra Gianotti 12/22/2018: #1: Left common femoral to tibioperoneal trunk bypass graft with non-reversed ipsilateral translocated saphenous vein #2: Left common femoral profundofemoral and external iliac endarterectomy #3: Left popliteal endarterectomy 09/11/19: Aortogram, BLE arteriogram, angioplasty of left TPT and peroneal artery. Stent left EIA by Dr. Myra Gianotti 10/23/19: Abdominal aortogram, mechanical thrombectomy of right EIA and CF artery, stent right CFA, EIA and CIA by Dr. Myra Gianotti 02/02/22: Aortogram, BLE arteriogram, angioplasty of left popliteal and peroneal artery;  angioplasty of right CF artery by Dr. Myra Gianotti 03/23/22: Aortogram BLE arteriogram, mechanical thrombectomy of the right EIA, CFA, SFA; stent of right SFA and right EIA; balloon angioplasty of right CFA; stent left EIA and CIA by Dr. Myra Gianotti  03/29/23:Aortogram, Arteriogram of BLE by Dr. Myra Gianotti 05/27/23: #1: Endovascular aortobiiliac stent (CPT [phone]) using Endologix AFX 22 40-13-40                       #2: Right femoral endarterectomy with bovine pericardial patch angioplasty                       #3: Right iliofemoral and profundofemoral thrombectomy                       #4: Left femoral exposure with left femoral endarterectomy and bovine pericardial patch angioplasty                       #5: Angioplasty, left and right external iliac artery                       #6: Right femoral to below-knee popliteal artery bypass graft with ipsilateral translocated nonreversed saphenous vein                       #7: Placement of Kerecis skin substitute to bilateral femoral incisions   No Known Allergies  Current Outpatient Medications  Medication Sig Dispense Refill   albuterol (VENTOLIN HFA) 108 (90 Base)  MCG/ACT inhaler Inhale 1-2 puffs into the lungs every 6 (six) hours as needed for wheezing or shortness of breath.     aspirin EC 81 MG tablet Take 81 mg by mouth daily.     cephALEXin (KEFLEX) 500 MG capsule Take 1 capsule (500 mg total) by mouth 2 (two) times daily. 10 capsule 0   clopidogrel (PLAVIX) 75 MG tablet Take 1 tablet (75 mg total) by mouth daily. 30 tablet 6   losartan (COZAAR) 50 MG tablet Take 50 mg by mouth daily.     mirtazapine (REMERON) 15 MG tablet Take 15 mg by mouth at bedtime.     Multiple Vitamins-Minerals (MULTIVITAMIN WITH MINERALS) tablet Take 1 tablet by mouth daily.     Oxycodone HCl 10 MG TABS Take 10 mg by mouth every 4 (four) hours as needed for severe pain.     rosuvastatin (CRESTOR) 20 MG tablet Take 20 mg by mouth at bedtime.     TRELEGY ELLIPTA 100-62.5-25  MCG/INH AEPB Inhale 1 puff into the lungs daily.     No current facility-administered medications for this visit.     ROS: cough x 1 month  Physical Exam:  Vitals:   07/12/23 1428  BP: 136/80  Pulse: 87  Resp: 18  Temp: 98.6 F (37 C)  SpO2: 90%    Incision:  left groin incision as shown below. Healthy granulation tissue in wound bed. Unable to express any drainage. Does not appear to tunnel. Wet to dry dressing applied. Right leg incisions all healing very nicely. There is dry eschar on right popliteal incision. No signs of infection.  Extremities:  BLE well perfused and warm with Doppler right DP/PT signals, left PT/Pero signals Neuro: alert and oriented  Abdomen:  flat, soft   Assessment/Plan:  This is a 73 y.o. female who is s/p: 1) incision and drainage of left groin wound 2) application of skin substitute to left groin (38 sq cm Kerecis) 3) application of negative pressure wound therapy (6 x 1 x 2 cm wound volume) on 06/29/23 by Dr. Lenell Antu. She had developed a left groin lymph leak after multilevel revascularization and was therefore indicated for washout of the left groin. She is without rest pain, claudication or tissue loss. 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 Will try her on Neurontin 100 mg BID - Advised her to follow up with PCP regarding cough -  Wound care should be wet to dry dressing changes daily or as needed if increased drainage - Advised her to call for earlier follow up if there are concerns about increased drainage, redness, warmth, or if she develops fever or chills -  She will follow up in 2-3 weeks for wound check   Nathanial Rancher, [facility] [phone]   Clinic MD:  Lenell Antu

## 2023-07-13 ENCOUNTER — Telehealth: Payer: Self-pay

## 2023-07-13 NOTE — Telephone Encounter (Signed)
 Called Lauren King back, nurse with [facility]. She was f/u after pt's visit here yesterday regarding wound care vs wound vac. She is aware of wet to dry dressing changes daily. No further questions/concerns at this time.

## 2023-07-14 ENCOUNTER — Other Ambulatory Visit: Payer: Self-pay

## 2023-07-14 DIAGNOSIS — I70221 Atherosclerosis of native arteries of extremities with rest pain, right leg: Secondary | ICD-10-CM

## 2023-07-25 ENCOUNTER — Ambulatory Visit: Payer: 59

## 2023-08-02 ENCOUNTER — Ambulatory Visit

## 2023-08-08 ENCOUNTER — Ambulatory Visit: Admitting: Surgery

## 2023-08-15 ENCOUNTER — Ambulatory Visit
Admission: RE | Admit: 2023-08-15 | Discharge: 2023-08-15 | Disposition: A | Discharge status: Home / Self Care | Source: Ambulatory Visit | Attending: Surgery | Admitting: Surgery

## 2023-08-15 ENCOUNTER — Ambulatory Visit: Admitting: Surgery

## 2023-08-15 DIAGNOSIS — I70221 Atherosclerosis of native arteries of extremities with rest pain, right leg: Secondary | ICD-10-CM

## 2023-08-15 MED ORDER — IOPAMIDOL (ISOVUE-370) INJECTION 76%
100.0000 mL | Freq: Once | INTRAVENOUS | Status: AC | PRN
  Administered 2023-08-15: 100 mL via INTRAVENOUS

## 2023-09-01 ENCOUNTER — Encounter: Payer: Self-pay | Admitting: *Deleted

## 2023-09-12 ENCOUNTER — Inpatient Hospital Stay (HOSPITAL_COMMUNITY)
Admission: EM | Admit: 2023-09-12 | Discharge: 2023-09-17 | DRG: 271 | Disposition: A | Discharge status: Home Health | Source: Other Acute Inpatient Hospital | Attending: Surgery | Admitting: Surgery

## 2023-09-12 ENCOUNTER — Other Ambulatory Visit: Payer: Self-pay

## 2023-09-12 ENCOUNTER — Encounter (HOSPITAL_COMMUNITY): Payer: Self-pay

## 2023-09-12 DIAGNOSIS — J449 Chronic obstructive pulmonary disease, unspecified: Secondary | ICD-10-CM | POA: Diagnosis not present

## 2023-09-12 DIAGNOSIS — Z7951 Long term (current) use of inhaled steroids: Secondary | ICD-10-CM | POA: Diagnosis not present

## 2023-09-12 DIAGNOSIS — Y718 Miscellaneous cardiovascular devices associated with adverse incidents, not elsewhere classified: Secondary | ICD-10-CM | POA: Diagnosis present

## 2023-09-12 DIAGNOSIS — K219 Gastro-esophageal reflux disease without esophagitis: Secondary | ICD-10-CM | POA: Diagnosis present

## 2023-09-12 DIAGNOSIS — F419 Anxiety disorder, unspecified: Secondary | ICD-10-CM | POA: Diagnosis not present

## 2023-09-12 DIAGNOSIS — Z85828 Personal history of other malignant neoplasm of skin: Secondary | ICD-10-CM

## 2023-09-12 DIAGNOSIS — R1909 Other intra-abdominal and pelvic swelling, mass and lump: Secondary | ICD-10-CM | POA: Diagnosis present

## 2023-09-12 DIAGNOSIS — I959 Hypotension, unspecified: Secondary | ICD-10-CM | POA: Diagnosis not present

## 2023-09-12 DIAGNOSIS — Z87891 Personal history of nicotine dependence: Secondary | ICD-10-CM | POA: Diagnosis not present

## 2023-09-12 DIAGNOSIS — Z7902 Long term (current) use of antithrombotics/antiplatelets: Secondary | ICD-10-CM

## 2023-09-12 DIAGNOSIS — D62 Acute posthemorrhagic anemia: Secondary | ICD-10-CM | POA: Diagnosis not present

## 2023-09-12 DIAGNOSIS — F32A Depression, unspecified: Secondary | ICD-10-CM | POA: Diagnosis present

## 2023-09-12 DIAGNOSIS — N179 Acute kidney failure, unspecified: Secondary | ICD-10-CM | POA: Diagnosis not present

## 2023-09-12 DIAGNOSIS — Z8673 Personal history of transient ischemic attack (TIA), and cerebral infarction without residual deficits: Secondary | ICD-10-CM

## 2023-09-12 DIAGNOSIS — I771 Stricture of artery: Secondary | ICD-10-CM | POA: Diagnosis not present

## 2023-09-12 DIAGNOSIS — T81718A Complication of other artery following a procedure, not elsewhere classified, initial encounter: Principal | ICD-10-CM | POA: Diagnosis present

## 2023-09-12 DIAGNOSIS — Z7982 Long term (current) use of aspirin: Secondary | ICD-10-CM

## 2023-09-12 DIAGNOSIS — Z79899 Other long term (current) drug therapy: Secondary | ICD-10-CM | POA: Diagnosis not present

## 2023-09-12 DIAGNOSIS — M545 Low back pain, unspecified: Secondary | ICD-10-CM | POA: Diagnosis not present

## 2023-09-12 DIAGNOSIS — G8929 Other chronic pain: Secondary | ICD-10-CM | POA: Diagnosis not present

## 2023-09-12 DIAGNOSIS — E785 Hyperlipidemia, unspecified: Secondary | ICD-10-CM | POA: Diagnosis present

## 2023-09-12 DIAGNOSIS — I70202 Unspecified atherosclerosis of native arteries of extremities, left leg: Secondary | ICD-10-CM | POA: Diagnosis present

## 2023-09-12 DIAGNOSIS — Z9889 Other specified postprocedural states: Secondary | ICD-10-CM

## 2023-09-12 DIAGNOSIS — I10 Essential (primary) hypertension: Secondary | ICD-10-CM | POA: Diagnosis present

## 2023-09-12 DIAGNOSIS — I724 Aneurysm of artery of lower extremity: Secondary | ICD-10-CM | POA: Diagnosis present

## 2023-09-12 NOTE — ED Provider Notes (Signed)
 [place] EMERGENCY DEPARTMENT AT [facility] Provider Note  CSN: [id number] Arrival date & time: 09/12/23 2246  Chief Complaint(s) Wound Infection  HPI Lauren King is a 73 y.o. female with a past medical history listed below including peripheral vascular disease status post numerous aorto and angiograms who had a left femoral endarterectomy and bovine pericardial patch placed on January 17 complicated by lymphatic leak requiring wound VAC placement.    patient presented to outside hospital today after 4 days of swelling in the left growing with increased ecchymosis and blood oozing from the surgical site.  Patient endorsing pain in the area especially with movement and palpation.  She denies any trauma.  Patient reports not being able to walk because of the pain.  CT imaging was unable to be obtained at [facility] due to an AKI noted on labs.  Labs from outside hospital include: CBC with a white count of 10.1 and hemoglobin of 12.8.  BMP with sodium of 131, potassium of 5.3, BUN of 62 and creatinine of 2.9  The history is provided by the patient and medical records.    Past Medical History Past Medical History:  Diagnosis Date   Anxiety    Chronic lower back pain    COPD (chronic obstructive pulmonary disease) (HCC)    Depression    GERD (gastroesophageal reflux disease)    Hypertension    Obstructive sleep apnea    CPAP   Peripheral vascular disease (HCC)    Patient Active Problem List   Diagnosis Date Noted   Non-healing surgical wound 06/29/2023   TIA (transient ischemic attack) 06/02/2023   HTN (hypertension) 06/02/2023   HLD (hyperlipidemia) 06/02/2023   OSA (obstructive sleep apnea) 06/02/2023   Critical limb ischemia of right lower extremity (HCC) 05/27/2023   Aortoiliac occlusive disease (HCC) 05/27/2023   COPD (chronic obstructive pulmonary disease) (HCC) 01/05/2022   Delusions (HCC) 01/03/2022   Hyponatremia 01/03/2022   Urinary tract infection in  elderly patient 01/03/2022   PAD (peripheral artery disease) (HCC) 12/22/2018   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/11/2018   Major depressive disorder, single episode 03/22/2017   Diverticulosis of colon without hemorrhage    Reflux esophagitis    Schatzki's ring    Gastric ulceration    Hiatal hernia    Encounter for screening colonoscopy 02/06/2015   Constipation 01/02/2015   Esophageal dysphagia 09/21/2012   GERD (gastroesophageal reflux disease) 09/21/2012   Loss of weight 09/21/2012   Home Medication(s) Prior to Admission medications   Medication Sig Start Date End Date Taking? Authorizing Provider  albuterol  (VENTOLIN  HFA) 108 (90 Base) MCG/ACT inhaler Inhale 1-2 puffs into the lungs every 6 (six) hours as needed for wheezing or shortness of breath.    [provider]  aspirin  EC 81 MG tablet Take 81 mg by mouth daily.    [provider]  benzonatate  (TESSALON  PERLES) 100 MG capsule Take 1 capsule (100 mg total) by mouth 3 (three) times daily as needed for cough. 07/12/23   Baglia, Corrina, PA-C  cephALEXin  (KEFLEX ) 500 MG capsule Take 1 capsule (500 mg total) by mouth 2 (two) times daily. Patient not taking: Reported on 07/12/2023 06/30/23   Butch Cashing, PA-C  clopidogrel  (PLAVIX ) 75 MG tablet Take 1 tablet (75 mg total) by mouth daily. 07/01/23   Cordie Deters, PA-C  gabapentin  (NEURONTIN ) 100 MG capsule Take 1 capsule (100 mg total) by mouth 2 (two) times daily. 07/12/23   Baglia,  Corrina, PA-C  losartan  (COZAAR ) 50 MG tablet Take 50 mg by mouth daily.    [provider]  mirtazapine  (REMERON ) 15 MG tablet Take 15 mg by mouth at bedtime. 04/04/23   [provider]  Multiple Vitamins-Minerals (MULTIVITAMIN WITH MINERALS) tablet Take 1 tablet by mouth daily.    [provider]  Oxycodone  HCl 10 MG TABS Take 10 mg by mouth every 4 (four) hours as needed for severe pain. 10/15/21   [provider]   rosuvastatin  (CRESTOR ) 20 MG tablet Take 20 mg by mouth at bedtime.    [provider]  TRELEGY ELLIPTA 100-62.5-25 MCG/INH AEPB Inhale 1 puff into the lungs daily.    [provider]                                                                                                                                    Allergies Patient has no known allergies.  Review of Systems Review of Systems As noted in HPI  Physical Exam Vital Signs  I have reviewed the triage vital signs BP 107/64   Pulse 79   Temp 98 F (36.7 C)   Resp 20   Ht 5\' 2"  (1.575 m)   Wt 30.4 kg   SpO2 100%   BMI 12.25 kg/m   Physical Exam Vitals reviewed.  Constitutional:      General: She is not in acute distress.    Appearance: She is well-developed. She is not diaphoretic.  HENT:     Head: Normocephalic and atraumatic.     Nose: Nose normal.  Eyes:     General: No scleral icterus.       Right eye: No discharge.        Left eye: No discharge.     Conjunctiva/sclera: Conjunctivae normal.     Pupils: Pupils are equal, round, and reactive to light.  Cardiovascular:     Rate and Rhythm: Normal rate and regular rhythm.     Pulses:          Dorsalis pedis pulses are 2+ on the right side and 2+ on the left side.     Heart sounds: No murmur heard.    No friction rub. No gallop.  Pulmonary:     Effort: Pulmonary effort is normal. No respiratory distress.     Breath sounds: Normal breath sounds. No stridor. No rales.  Abdominal:     General: There is no distension.     Palpations: Abdomen is soft.     Tenderness: There is no abdominal tenderness.  Musculoskeletal:        General: No tenderness.     Cervical back: Normal range of motion and neck supple.  Skin:    General: Skin is warm and dry.     Findings: No erythema or rash.       Neurological:     Mental Status: She is alert and oriented  to person, place, and time.     ED Results and Treatments Labs (all labs ordered are  listed, but only abnormal results are displayed) Labs Reviewed - No data to display                                                                                                                       EKG  EKG Interpretation Date/Time:    Ventricular Rate:    PR Interval:    QRS Duration:    QT Interval:    QTC Calculation:   R Axis:      Text Interpretation:         Radiology No results found.  Medications Ordered in ED Medications  fentaNYL  (SUBLIMAZE ) injection 50 mcg (50 mcg Intravenous Given 09/13/23 0052)   Procedures Procedures  (including critical care time) Medical Decision Making / ED Course   Medical Decision Making Amount and/or Complexity of Data Reviewed External Data Reviewed: labs and notes.    Details: Noted in HPI  Risk Prescription drug management. Parenteral controlled substances. Decision regarding hospitalization. Emergency major surgery.     Clinical Course as of 09/13/23 0111  Tue Sep 13, 2023  0001 Pulsatile left growing mass.  Concerning for aneurysm versus pseudoaneurysm.  Does not appear to be consistent with abscess.  Unable to obtain imaging including CT at this time given her AKI, no ultrasound given the time of day. [PC]  0005 Spoke with Dr. Vikki Graves from [facility] who will come see patient in the ER. [PC]  0104 Vascular surgery plans to take patient to the OR tonight.  They will plan to reach out to medicine team in the morning to address AKI. [PC]    Clinical Course User Index [PC] Helga Asbury, Camila Cecil, MD    Final Clinical Impression(s) / ED Diagnoses Final diagnoses:  Pulsatile groin mass    This chart was dictated using voice recognition software.  Despite best efforts to proofread,  errors can occur which can change the documentation meaning.    Lindle Rhea, MD 09/13/23 0111

## 2023-09-12 NOTE — ED Triage Notes (Addendum)
 Coming from [facility] with a left groin wound infection from a vascular surgery a few weeks ago. No fevers.

## 2023-09-13 ENCOUNTER — Encounter (HOSPITAL_COMMUNITY): Payer: Self-pay | Admitting: Vascular Surgery

## 2023-09-13 ENCOUNTER — Encounter (HOSPITAL_COMMUNITY)
Admission: EM | Disposition: A | Discharge status: Home Health | Payer: Self-pay | Source: Other Acute Inpatient Hospital | Attending: Vascular Surgery

## 2023-09-13 ENCOUNTER — Emergency Department (HOSPITAL_COMMUNITY): Admitting: Registered Nurse

## 2023-09-13 ENCOUNTER — Other Ambulatory Visit: Payer: Self-pay

## 2023-09-13 DIAGNOSIS — T81718A Complication of other artery following a procedure, not elsewhere classified, initial encounter: Secondary | ICD-10-CM | POA: Diagnosis present

## 2023-09-13 DIAGNOSIS — M545 Low back pain, unspecified: Secondary | ICD-10-CM | POA: Diagnosis present

## 2023-09-13 DIAGNOSIS — Z85828 Personal history of other malignant neoplasm of skin: Secondary | ICD-10-CM | POA: Diagnosis not present

## 2023-09-13 DIAGNOSIS — Z7982 Long term (current) use of aspirin: Secondary | ICD-10-CM

## 2023-09-13 DIAGNOSIS — N179 Acute kidney failure, unspecified: Secondary | ICD-10-CM

## 2023-09-13 DIAGNOSIS — F32A Depression, unspecified: Secondary | ICD-10-CM | POA: Diagnosis present

## 2023-09-13 DIAGNOSIS — Z95828 Presence of other vascular implants and grafts: Secondary | ICD-10-CM | POA: Diagnosis not present

## 2023-09-13 DIAGNOSIS — Z87891 Personal history of nicotine dependence: Secondary | ICD-10-CM

## 2023-09-13 DIAGNOSIS — I959 Hypotension, unspecified: Secondary | ICD-10-CM | POA: Diagnosis not present

## 2023-09-13 DIAGNOSIS — I724 Aneurysm of artery of lower extremity: Secondary | ICD-10-CM

## 2023-09-13 DIAGNOSIS — D62 Acute posthemorrhagic anemia: Secondary | ICD-10-CM | POA: Diagnosis not present

## 2023-09-13 DIAGNOSIS — G4733 Obstructive sleep apnea (adult) (pediatric): Secondary | ICD-10-CM | POA: Diagnosis not present

## 2023-09-13 DIAGNOSIS — I70202 Unspecified atherosclerosis of native arteries of extremities, left leg: Secondary | ICD-10-CM | POA: Diagnosis present

## 2023-09-13 DIAGNOSIS — Z7951 Long term (current) use of inhaled steroids: Secondary | ICD-10-CM | POA: Diagnosis not present

## 2023-09-13 DIAGNOSIS — F419 Anxiety disorder, unspecified: Secondary | ICD-10-CM | POA: Diagnosis present

## 2023-09-13 DIAGNOSIS — Z7902 Long term (current) use of antithrombotics/antiplatelets: Secondary | ICD-10-CM

## 2023-09-13 DIAGNOSIS — R1909 Other intra-abdominal and pelvic swelling, mass and lump: Secondary | ICD-10-CM | POA: Diagnosis present

## 2023-09-13 DIAGNOSIS — Z8673 Personal history of transient ischemic attack (TIA), and cerebral infarction without residual deficits: Secondary | ICD-10-CM | POA: Diagnosis not present

## 2023-09-13 DIAGNOSIS — Z79899 Other long term (current) drug therapy: Secondary | ICD-10-CM | POA: Diagnosis not present

## 2023-09-13 DIAGNOSIS — Y718 Miscellaneous cardiovascular devices associated with adverse incidents, not elsewhere classified: Secondary | ICD-10-CM | POA: Diagnosis present

## 2023-09-13 DIAGNOSIS — E785 Hyperlipidemia, unspecified: Secondary | ICD-10-CM | POA: Diagnosis present

## 2023-09-13 DIAGNOSIS — I771 Stricture of artery: Secondary | ICD-10-CM | POA: Diagnosis present

## 2023-09-13 DIAGNOSIS — Z9889 Other specified postprocedural states: Secondary | ICD-10-CM | POA: Diagnosis not present

## 2023-09-13 DIAGNOSIS — I10 Essential (primary) hypertension: Secondary | ICD-10-CM

## 2023-09-13 DIAGNOSIS — K219 Gastro-esophageal reflux disease without esophagitis: Secondary | ICD-10-CM | POA: Diagnosis present

## 2023-09-13 DIAGNOSIS — S31502A Unspecified open wound of unspecified external genital organs, female, initial encounter: Secondary | ICD-10-CM

## 2023-09-13 DIAGNOSIS — J449 Chronic obstructive pulmonary disease, unspecified: Secondary | ICD-10-CM

## 2023-09-13 DIAGNOSIS — G8929 Other chronic pain: Secondary | ICD-10-CM | POA: Diagnosis present

## 2023-09-13 HISTORY — PX: THROMBECTOMY FEMORAL ARTERY: SHX6406

## 2023-09-13 HISTORY — PX: FEMORAL ARTERY EXPLORATION: SHX5160

## 2023-09-13 HISTORY — PX: MUSCLE FLAP CLOSURE: SHX2054

## 2023-09-13 HISTORY — PX: APPLICATION OF WOUND VAC: SHX5189

## 2023-09-13 LAB — LIPID PANEL
Cholesterol: 61 mg/dL (ref 0–200)
HDL: 14 mg/dL — ABNORMAL LOW (ref >40)
LDL Cholesterol: 26 mg/dL (ref 0–99)
Total CHOL/HDL Ratio: 4.4 ratio
Triglycerides: 107 mg/dL (ref <150)
VLDL: 21 mg/dL (ref 0–40)

## 2023-09-13 LAB — POCT I-STAT 7, (LYTES, BLD GAS, ICA,H+H)
Acid-base deficit: 5 mmol/L — ABNORMAL HIGH (ref 0.0–2.0)
Acid-base deficit: 5 mmol/L — ABNORMAL HIGH (ref 0.0–2.0)
Bicarbonate: 20.5 mmol/L (ref 20.0–28.0)
Bicarbonate: 21.8 mmol/L (ref 20.0–28.0)
Calcium, Ion: 1.16 mmol/L (ref 1.15–1.40)
Calcium, Ion: 1.19 mmol/L (ref 1.15–1.40)
HCT: 28 % — ABNORMAL LOW (ref 36.0–46.0)
HCT: 24 % — ABNORMAL LOW (ref 36.0–46.0)
Hemoglobin: 9.5 g/dL — ABNORMAL LOW (ref 12.0–15.0)
Hemoglobin: 8.2 g/dL — ABNORMAL LOW (ref 12.0–15.0)
O2 Saturation: 100 %
O2 Saturation: 99 %
Potassium: 3.9 mmol/L (ref 3.5–5.1)
Potassium: 4.1 mmol/L (ref 3.5–5.1)
Sodium: 132 mmol/L — ABNORMAL LOW (ref 135–145)
Sodium: 131 mmol/L — ABNORMAL LOW (ref 135–145)
TCO2: 22 mmol/L (ref 22–32)
TCO2: 23 mmol/L (ref 22–32)
pCO2 arterial: 39.6 mmHg (ref 32–48)
pCO2 arterial: 51.1 mmHg — ABNORMAL HIGH (ref 32–48)
pH, Arterial: 7.323 — ABNORMAL LOW (ref 7.35–7.45)
pH, Arterial: 7.238 — ABNORMAL LOW (ref 7.35–7.45)
pO2, Arterial: 306 mmHg — ABNORMAL HIGH (ref 83–108)
pO2, Arterial: 139 mmHg — ABNORMAL HIGH (ref 83–108)

## 2023-09-13 LAB — CBC
HCT: 19.5 % — ABNORMAL LOW (ref 36.0–46.0)
HCT: 23.1 % — ABNORMAL LOW (ref 36.0–46.0)
Hemoglobin: 6.1 g/dL — CL (ref 12.0–15.0)
Hemoglobin: 7.5 g/dL — ABNORMAL LOW (ref 12.0–15.0)
MCH: 26.5 pg (ref 26.0–34.0)
MCH: 28.4 pg (ref 26.0–34.0)
MCHC: 31.3 g/dL (ref 30.0–36.0)
MCHC: 32.5 g/dL (ref 30.0–36.0)
MCV: 84.8 fL (ref 80.0–100.0)
MCV: 87.5 fL (ref 80.0–100.0)
Platelets: 145 10*3/uL — ABNORMAL LOW (ref 150–400)
Platelets: 145 10*3/uL — ABNORMAL LOW (ref 150–400)
RBC: 2.3 MIL/uL — ABNORMAL LOW (ref 3.87–5.11)
RBC: 2.64 MIL/uL — ABNORMAL LOW (ref 3.87–5.11)
RDW: 16.9 % — ABNORMAL HIGH (ref 11.5–15.5)
RDW: 16.4 % — ABNORMAL HIGH (ref 11.5–15.5)
WBC: 6.6 10*3/uL (ref 4.0–10.5)
WBC: 7.1 10*3/uL (ref 4.0–10.5)
nRBC: 0 % (ref 0.0–0.2)
nRBC: 0 % (ref 0.0–0.2)

## 2023-09-13 LAB — PREPARE RBC (CROSSMATCH)

## 2023-09-13 LAB — BASIC METABOLIC PANEL WITH GFR
Anion gap: 12 (ref 5–15)
BUN: 36 mg/dL — ABNORMAL HIGH (ref 8–23)
CO2: 18 mmol/L — ABNORMAL LOW (ref 22–32)
Calcium: 7.6 mg/dL — ABNORMAL LOW (ref 8.9–10.3)
Chloride: 103 mmol/L (ref 98–111)
Creatinine, Ser: 1.07 mg/dL — ABNORMAL HIGH (ref 0.44–1.00)
GFR, Estimated: 55 mL/min — ABNORMAL LOW (ref >60)
Glucose, Bld: 244 mg/dL — ABNORMAL HIGH (ref 70–99)
Potassium: 3.9 mmol/L (ref 3.5–5.1)
Sodium: 133 mmol/L — ABNORMAL LOW (ref 135–145)

## 2023-09-13 LAB — CREATININE, SERUM
Creatinine, Ser: 1.02 mg/dL — ABNORMAL HIGH (ref 0.44–1.00)
GFR, Estimated: 58 mL/min — ABNORMAL LOW (ref >60)

## 2023-09-13 LAB — MRSA NEXT GEN BY PCR, NASAL: MRSA by PCR Next Gen: NOT DETECTED

## 2023-09-13 SURGERY — EXPLORATION, ARTERY, FEMORAL
Anesthesia: General | Site: Groin | Laterality: Left

## 2023-09-13 MED ORDER — METOPROLOL TARTRATE 5 MG/5ML IV SOLN: 2.0000 mg | INTRAVENOUS | Status: DC | PRN

## 2023-09-13 MED ORDER — PROPOFOL 10 MG/ML IV BOLUS
INTRAVENOUS | Status: AC
  Filled 2023-09-13: qty 20

## 2023-09-13 MED ORDER — HEPARIN SODIUM (PORCINE) 1000 UNIT/ML IJ SOLN
INTRAMUSCULAR | Status: AC
  Filled 2023-09-13: qty 10

## 2023-09-13 MED ORDER — LOSARTAN POTASSIUM 50 MG PO TABS
50.0000 mg | ORAL_TABLET | Freq: Every day | ORAL | Status: DC
  Administered 2023-09-14 – 2023-09-17 (×4): 50 mg via ORAL
  Filled 2023-09-13 (×4): qty 1

## 2023-09-13 MED ORDER — ACETAMINOPHEN 325 MG PO TABS: 325.0000 mg | ORAL_TABLET | ORAL | Status: DC | PRN

## 2023-09-13 MED ORDER — LIDOCAINE 2% (20 MG/ML) 5 ML SYRINGE
INTRAMUSCULAR | Status: AC
  Filled 2023-09-13: qty 5

## 2023-09-13 MED ORDER — HEPARIN SODIUM (PORCINE) 5000 UNIT/ML IJ SOLN
5000.0000 [IU] | Freq: Three times a day (TID) | INTRAMUSCULAR | Status: DC
  Administered 2023-09-14 – 2023-09-17 (×11): 5000 [IU] via SUBCUTANEOUS
  Filled 2023-09-13 (×11): qty 1

## 2023-09-13 MED ORDER — VASHE WOUND IRRIGATION OPTIME
TOPICAL | Status: DC | PRN
  Administered 2023-09-13: 34 [oz_av]

## 2023-09-13 MED ORDER — EPHEDRINE SULFATE-NACL 50-0.9 MG/10ML-% IV SOSY
PREFILLED_SYRINGE | INTRAVENOUS | Status: DC | PRN
  Administered 2023-09-13: 5 mg via INTRAVENOUS

## 2023-09-13 MED ORDER — DEXAMETHASONE SODIUM PHOSPHATE 10 MG/ML IJ SOLN
INTRAMUSCULAR | Status: AC
  Filled 2023-09-13: qty 1

## 2023-09-13 MED ORDER — PHENYLEPHRINE 80 MCG/ML (10ML) SYRINGE FOR IV PUSH (FOR BLOOD PRESSURE SUPPORT)
PREFILLED_SYRINGE | INTRAVENOUS | Status: AC
  Filled 2023-09-13: qty 10

## 2023-09-13 MED ORDER — CEFAZOLIN SODIUM-DEXTROSE 1-4 GM/50ML-% IV SOLN
INTRAVENOUS | Status: DC | PRN
  Administered 2023-09-13: 1 g via INTRAVENOUS

## 2023-09-13 MED ORDER — ONDANSETRON HCL 4 MG/2ML IJ SOLN: 4.0000 mg | Freq: Four times a day (QID) | INTRAMUSCULAR | Status: DC | PRN

## 2023-09-13 MED ORDER — ACETAMINOPHEN 650 MG RE SUPP: 325.0000 mg | RECTAL | Status: DC | PRN

## 2023-09-13 MED ORDER — FENTANYL CITRATE (PF) 250 MCG/5ML IJ SOLN
INTRAMUSCULAR | Status: DC | PRN
  Administered 2023-09-13: 25 ug via INTRAVENOUS

## 2023-09-13 MED ORDER — ASPIRIN 81 MG PO TBEC
81.0000 mg | DELAYED_RELEASE_TABLET | Freq: Every day | ORAL | Status: DC
  Administered 2023-09-13 – 2023-09-17 (×5): 81 mg via ORAL
  Filled 2023-09-13 (×5): qty 1

## 2023-09-13 MED ORDER — ORAL CARE MOUTH RINSE: 15.0000 mL | OROMUCOSAL | Status: DC | PRN

## 2023-09-13 MED ORDER — ONDANSETRON HCL 4 MG/2ML IJ SOLN
INTRAMUSCULAR | Status: AC
  Filled 2023-09-13: qty 2

## 2023-09-13 MED ORDER — PANTOPRAZOLE SODIUM 40 MG PO TBEC
40.0000 mg | DELAYED_RELEASE_TABLET | Freq: Every day | ORAL | Status: DC
  Administered 2023-09-13 – 2023-09-17 (×5): 40 mg via ORAL
  Filled 2023-09-13 (×5): qty 1

## 2023-09-13 MED ORDER — FENTANYL CITRATE PF 50 MCG/ML IJ SOSY
50.0000 ug | PREFILLED_SYRINGE | Freq: Once | INTRAMUSCULAR | Status: AC
  Administered 2023-09-13: 50 ug via INTRAVENOUS
  Filled 2023-09-13: qty 1

## 2023-09-13 MED ORDER — ROCURONIUM BROMIDE 10 MG/ML (PF) SYRINGE
PREFILLED_SYRINGE | INTRAVENOUS | Status: DC | PRN
  Administered 2023-09-13: 50 mg via INTRAVENOUS

## 2023-09-13 MED ORDER — LIDOCAINE 2% (20 MG/ML) 5 ML SYRINGE
INTRAMUSCULAR | Status: DC | PRN
Start: 2023-09-13 — End: 2023-09-13
  Administered 2023-09-13: 40 mg via INTRAVENOUS

## 2023-09-13 MED ORDER — FENTANYL CITRATE (PF) 100 MCG/2ML IJ SOLN
INTRAMUSCULAR | Status: AC
  Filled 2023-09-13: qty 2

## 2023-09-13 MED ORDER — ROCURONIUM BROMIDE 10 MG/ML (PF) SYRINGE
PREFILLED_SYRINGE | INTRAVENOUS | Status: AC
  Filled 2023-09-13: qty 10

## 2023-09-13 MED ORDER — BISACODYL 5 MG PO TBEC: 5.0000 mg | DELAYED_RELEASE_TABLET | Freq: Every day | ORAL | Status: DC | PRN

## 2023-09-13 MED ORDER — ENSURE ENLIVE PO LIQD
237.0000 mL | Freq: Two times a day (BID) | ORAL | Status: DC
  Administered 2023-09-14 – 2023-09-17 (×8): 237 mL via ORAL

## 2023-09-13 MED ORDER — SUGAMMADEX SODIUM 200 MG/2ML IV SOLN
INTRAVENOUS | Status: DC | PRN
  Administered 2023-09-13: 100 mg via INTRAVENOUS

## 2023-09-13 MED ORDER — DEXAMETHASONE SODIUM PHOSPHATE 10 MG/ML IJ SOLN
INTRAMUSCULAR | Status: DC | PRN
  Administered 2023-09-13: 5 mg via INTRAVENOUS

## 2023-09-13 MED ORDER — SUCCINYLCHOLINE CHLORIDE 200 MG/10ML IV SOSY
PREFILLED_SYRINGE | INTRAVENOUS | Status: AC
  Filled 2023-09-13: qty 10

## 2023-09-13 MED ORDER — FENTANYL CITRATE (PF) 250 MCG/5ML IJ SOLN
INTRAMUSCULAR | Status: AC
  Filled 2023-09-13: qty 5

## 2023-09-13 MED ORDER — CLOPIDOGREL BISULFATE 75 MG PO TABS
75.0000 mg | ORAL_TABLET | Freq: Every day | ORAL | Status: DC
  Administered 2023-09-14 – 2023-09-17 (×4): 75 mg via ORAL
  Filled 2023-09-13 (×4): qty 1

## 2023-09-13 MED ORDER — PHENYLEPHRINE 80 MCG/ML (10ML) SYRINGE FOR IV PUSH (FOR BLOOD PRESSURE SUPPORT)
PREFILLED_SYRINGE | INTRAVENOUS | Status: DC | PRN
  Administered 2023-09-13: 160 ug via INTRAVENOUS
  Administered 2023-09-13 (×2): 80 ug via INTRAVENOUS
  Administered 2023-09-13: 160 ug via INTRAVENOUS

## 2023-09-13 MED ORDER — ALBUMIN HUMAN 5 % IV SOLN: INTRAVENOUS | Status: DC | PRN

## 2023-09-13 MED ORDER — GABAPENTIN 100 MG PO CAPS
100.0000 mg | ORAL_CAPSULE | Freq: Two times a day (BID) | ORAL | Status: DC
  Administered 2023-09-13 – 2023-09-17 (×9): 100 mg via ORAL
  Filled 2023-09-13 (×9): qty 1

## 2023-09-13 MED ORDER — HYDROMORPHONE HCL 1 MG/ML IJ SOLN
0.5000 mg | INTRAMUSCULAR | Status: DC | PRN
Start: 2023-09-13 — End: 2023-09-18
  Administered 2023-09-13: 1 mg via INTRAVENOUS
  Administered 2023-09-14: 0.5 mg via INTRAVENOUS
  Administered 2023-09-15: 1 mg via INTRAVENOUS
  Administered 2023-09-15 – 2023-09-17 (×3): 0.5 mg via INTRAVENOUS
  Filled 2023-09-13: qty 1
  Filled 2023-09-13: qty 0.5
  Filled 2023-09-13: qty 1
  Filled 2023-09-13: qty 0.5
  Filled 2023-09-13 (×2): qty 1

## 2023-09-13 MED ORDER — PHENOL 1.4 % MT LIQD: 1.0000 | OROMUCOSAL | Status: DC | PRN

## 2023-09-13 MED ORDER — EPHEDRINE 5 MG/ML INJ
INTRAVENOUS | Status: AC
  Filled 2023-09-13: qty 5

## 2023-09-13 MED ORDER — LACTATED RINGERS IV SOLN
INTRAVENOUS | Status: DC | PRN
Start: 2023-09-13 — End: 2023-09-13

## 2023-09-13 MED ORDER — MAGNESIUM SULFATE 2 GM/50ML IV SOLN: 2.0000 g | Freq: Every day | INTRAVENOUS | Status: DC | PRN

## 2023-09-13 MED ORDER — PROPOFOL 10 MG/ML IV BOLUS
INTRAVENOUS | Status: DC | PRN
  Administered 2023-09-13: 70 mg via INTRAVENOUS

## 2023-09-13 MED ORDER — 0.9 % SODIUM CHLORIDE (POUR BTL) OPTIME
TOPICAL | Status: DC | PRN
  Administered 2023-09-13: 1000 mL

## 2023-09-13 MED ORDER — SUCCINYLCHOLINE CHLORIDE 200 MG/10ML IV SOSY
PREFILLED_SYRINGE | INTRAVENOUS | Status: DC | PRN
Start: 2023-09-13 — End: 2023-09-13
  Administered 2023-09-13: 60 mg via INTRAVENOUS

## 2023-09-13 MED ORDER — SENNOSIDES-DOCUSATE SODIUM 8.6-50 MG PO TABS: 1.0000 | ORAL_TABLET | Freq: Every evening | ORAL | Status: DC | PRN

## 2023-09-13 MED ORDER — OXYCODONE-ACETAMINOPHEN 5-325 MG PO TABS
1.0000 | ORAL_TABLET | ORAL | Status: DC | PRN
  Administered 2023-09-13 – 2023-09-16 (×16): 2 via ORAL
  Administered 2023-09-16: 1 via ORAL
  Administered 2023-09-17 (×3): 2 via ORAL
  Filled 2023-09-13 (×20): qty 2

## 2023-09-13 MED ORDER — HEPARIN 6000 UNIT IRRIGATION SOLUTION
Status: DC | PRN
  Administered 2023-09-13: 1

## 2023-09-13 MED ORDER — HEPARIN 6000 UNIT IRRIGATION SOLUTION
Status: AC
Start: 2023-09-13 — End: ?
  Filled 2023-09-13: qty 500

## 2023-09-13 MED ORDER — PHENYLEPHRINE HCL-NACL 20-0.9 MG/250ML-% IV SOLN
INTRAVENOUS | Status: DC | PRN
  Administered 2023-09-13: 45 ug/min via INTRAVENOUS

## 2023-09-13 MED ORDER — SODIUM CHLORIDE 0.9 % IV SOLN: 500.0000 mL | Freq: Once | INTRAVENOUS | Status: DC | PRN

## 2023-09-13 MED ORDER — CEFAZOLIN SODIUM-DEXTROSE 1-4 GM/50ML-% IV SOLN
1.0000 g | Freq: Three times a day (TID) | INTRAVENOUS | Status: AC
  Administered 2023-09-13 (×2): 1 g via INTRAVENOUS
  Filled 2023-09-13 (×2): qty 50

## 2023-09-13 MED ORDER — POTASSIUM CHLORIDE CRYS ER 20 MEQ PO TBCR: 20.0000 meq | EXTENDED_RELEASE_TABLET | Freq: Every day | ORAL | Status: DC | PRN

## 2023-09-13 MED ORDER — ONDANSETRON HCL 4 MG/2ML IJ SOLN: 4.0000 mg | Freq: Once | INTRAMUSCULAR | Status: DC | PRN

## 2023-09-13 MED ORDER — HYDRALAZINE HCL 20 MG/ML IJ SOLN: 5.0000 mg | INTRAMUSCULAR | Status: DC | PRN

## 2023-09-13 MED ORDER — HEPARIN SODIUM (PORCINE) 1000 UNIT/ML IJ SOLN
INTRAMUSCULAR | Status: DC | PRN
  Administered 2023-09-13: 5000 [IU] via INTRAVENOUS

## 2023-09-13 MED ORDER — ROSUVASTATIN CALCIUM 20 MG PO TABS
20.0000 mg | ORAL_TABLET | Freq: Every day | ORAL | Status: DC
  Administered 2023-09-13 – 2023-09-16 (×4): 20 mg via ORAL
  Filled 2023-09-13 (×4): qty 1

## 2023-09-13 MED ORDER — MIRTAZAPINE 15 MG PO TABS
15.0000 mg | ORAL_TABLET | Freq: Every day | ORAL | Status: DC
Start: 2023-09-13 — End: 2023-09-18
  Administered 2023-09-13 – 2023-09-16 (×4): 15 mg via ORAL
  Filled 2023-09-13 (×4): qty 1

## 2023-09-13 MED ORDER — ALUM & MAG HYDROXIDE-SIMETH 200-200-20 MG/5ML PO SUSP: 15.0000 mL | ORAL | Status: DC | PRN

## 2023-09-13 MED ORDER — ONDANSETRON HCL 4 MG/2ML IJ SOLN
INTRAMUSCULAR | Status: DC | PRN
  Administered 2023-09-13: 4 mg via INTRAVENOUS

## 2023-09-13 MED ORDER — FENTANYL CITRATE (PF) 100 MCG/2ML IJ SOLN
25.0000 ug | INTRAMUSCULAR | Status: DC | PRN
  Administered 2023-09-13: 50 ug via INTRAVENOUS

## 2023-09-13 MED ORDER — GUAIFENESIN-DM 100-10 MG/5ML PO SYRP
15.0000 mL | ORAL_SOLUTION | ORAL | Status: DC | PRN
  Administered 2023-09-13 – 2023-09-17 (×13): 15 mL via ORAL
  Filled 2023-09-13 (×13): qty 15

## 2023-09-13 MED ORDER — LACTATED RINGERS IV SOLN: INTRAVENOUS | Status: DC | PRN

## 2023-09-13 MED ORDER — SODIUM CHLORIDE 0.9 % IR SOLN
Status: DC | PRN
  Administered 2023-09-13: 1000 mL

## 2023-09-13 MED ORDER — LABETALOL HCL 5 MG/ML IV SOLN: 10.0000 mg | INTRAVENOUS | Status: DC | PRN

## 2023-09-13 MED ORDER — CHLORHEXIDINE GLUCONATE CLOTH 2 % EX PADS
6.0000 | MEDICATED_PAD | Freq: Every day | CUTANEOUS | Status: DC
  Administered 2023-09-13 – 2023-09-15 (×3): 6 via TOPICAL

## 2023-09-13 MED ORDER — SODIUM CHLORIDE 0.9 % IV SOLN: INTRAVENOUS | Status: AC

## 2023-09-13 MED ORDER — DOCUSATE SODIUM 100 MG PO CAPS
100.0000 mg | ORAL_CAPSULE | Freq: Every day | ORAL | Status: DC
  Administered 2023-09-14 – 2023-09-17 (×4): 100 mg via ORAL
  Filled 2023-09-13 (×4): qty 1

## 2023-09-13 SURGICAL SUPPLY — 45 items
BAG COUNTER SPONGE SURGICOUNT (BAG) ×2 IMPLANT
BNDG ELASTIC 4X5.8 VLCR STR LF (GAUZE/BANDAGES/DRESSINGS) IMPLANT
CANISTER SUCT 3000ML PPV (MISCELLANEOUS) ×2 IMPLANT
CANISTER WOUNDNEG PRESSURE 500 (CANNISTER) IMPLANT
CATH EMB 4FR 40 (CATHETERS) IMPLANT
CLIP LIGATING EXTRA MED SLVR (CLIP) ×2 IMPLANT
CLIP LIGATING EXTRA SM BLUE (MISCELLANEOUS) ×2 IMPLANT
DERMABOND ADVANCED .7 DNX12 (GAUZE/BANDAGES/DRESSINGS) ×2 IMPLANT
DRAIN CHANNEL 15F RND FF W/TCR (WOUND CARE) IMPLANT
DRAPE X-RAY CASS 24X20 (DRAPES) IMPLANT
DRSG VAC GRANUFOAM SM (GAUZE/BANDAGES/DRESSINGS) IMPLANT
ELECTRODE REM PT RTRN 9FT ADLT (ELECTROSURGICAL) ×2 IMPLANT
EVACUATOR SILICONE 100CC (DRAIN) IMPLANT
GLOVE BIO SURGEON STRL SZ7.5 (GLOVE) ×2 IMPLANT
GOWN STRL REUS W/ TWL LRG LVL3 (GOWN DISPOSABLE) ×4 IMPLANT
GOWN STRL REUS W/ TWL XL LVL3 (GOWN DISPOSABLE) ×2 IMPLANT
HEMOSTAT SNOW SURGICEL 2X4 (HEMOSTASIS) IMPLANT
KIT BASIN OR (CUSTOM PROCEDURE TRAY) ×2 IMPLANT
KIT TURNOVER KIT B (KITS) ×2 IMPLANT
MARKER GRAFT CORONARY BYPASS (MISCELLANEOUS) IMPLANT
NS IRRIG 1000ML POUR BTL (IV SOLUTION) ×4 IMPLANT
PACK PERIPHERAL VASCULAR (CUSTOM PROCEDURE TRAY) ×2 IMPLANT
PAD ARMBOARD POSITIONER FOAM (MISCELLANEOUS) ×4 IMPLANT
POWDER SURGICEL 3.0 GRAM (HEMOSTASIS) IMPLANT
SET COLLECT BLD 21X3/4 12 (NEEDLE) IMPLANT
SET HNDPC FAN SPRY TIP SCT (DISPOSABLE) IMPLANT
SPONGE T-LAP 18X18 ~~LOC~~+RFID (SPONGE) IMPLANT
STAPLER SKIN PROX 35W (STAPLE) IMPLANT
STOPCOCK 4 WAY LG BORE MALE ST (IV SETS) IMPLANT
STOPCOCK MORSE 400PSI 3WAY (MISCELLANEOUS) IMPLANT
SUT ETHILON 3 0 PS 1 (SUTURE) IMPLANT
SUT PROLENE 5 0 C 1 24 (SUTURE) ×2 IMPLANT
SUT PROLENE 6 0 BV (SUTURE) ×2 IMPLANT
SUT SILK 3-0 18XBRD TIE 12 (SUTURE) IMPLANT
SUT VIC AB 2-0 CT1 TAPERPNT 27 (SUTURE) ×4 IMPLANT
SUT VIC AB 3-0 SH 27X BRD (SUTURE) ×4 IMPLANT
SUT VICRYL 4-0 PS2 18IN ABS (SUTURE) ×4 IMPLANT
SWAB CULTURE ESWAB REG 1ML (MISCELLANEOUS) IMPLANT
SWAB CULTURE LIQ STUART DBL (MISCELLANEOUS) IMPLANT
SYR 3ML LL SCALE MARK (SYRINGE) IMPLANT
TOWEL GREEN STERILE (TOWEL DISPOSABLE) ×2 IMPLANT
TRAY FOLEY MTR SLVR 16FR STAT (SET/KITS/TRAYS/PACK) IMPLANT
TUBING EXTENTION W/L.L. (IV SETS) IMPLANT
UNDERPAD 30X36 HEAVY ABSORB (UNDERPADS AND DIAPERS) ×2 IMPLANT
WATER STERILE IRR 1000ML POUR (IV SOLUTION) ×2 IMPLANT

## 2023-09-13 NOTE — Progress Notes (Signed)
 Patient arrived to PACU bay 13 from OR 11 via hospital bed. Dr. Vikki Graves in to assess at bedside shortly after. Patient was cool to the touch and Dr. Vikki Graves  was unable to obtain pulse to left foot although, color, movement and sensation are intact. He stated that even at her baseline she had very weak pulses. He was pleased with her condition. Patients vital signs remained stable and pain was managed effectively with ordered pain medication.  Call placed to Dr. Gwenevere Lent for approval to transport to 2H, he was agreeable to transfer at this time. Report called to [facility] nurse Austine Lefort. Patient transported to 2H in good spirits with no complaints. No complications noted. Bedside visual assessment and updated report completed with Austine Lefort, Charity fundraiser. Patient in no distress, appreciative of care provided.

## 2023-09-13 NOTE — ED Notes (Signed)
 Report given to the PACU. To be transported upstairs.

## 2023-09-13 NOTE — Progress Notes (Addendum)
  Progress Note    09/13/2023 7:20 AM * Day of Surgery *  Subjective: sitting up, no major complaints. Denies any pain in her left foot   Vitals:   09/13/23 0535 09/13/23 0548  BP: (!) 114/99   Pulse: 91 90  Resp: 15 15  Temp:    SpO2: 93% 97%   Physical Exam: Cardiac:  regular Lungs:  non labored Incisions: left groin with VAC to suction, good seal Extremities:  LLE well perfused and warm with doppler PT signal, motor and sensation intact Abdomen:  soft Neurologic: alert and oriented   CBC    Component Value Date/Time   WBC 6.3 06/30/2023 0241   RBC 3.22 (L) 06/30/2023 0241   HGB 8.2 (L) 09/13/2023 0339   HGB 12.3 08/28/2012 0000   HCT 24.0 (L) 09/13/2023 0339   HCT 36 08/28/2012 0000   PLT 303 06/30/2023 0241   MCV 91.3 06/30/2023 0241   MCV 92.4 08/28/2012 0000   MCH 28.6 06/30/2023 0241   MCHC 31.3 06/30/2023 0241   RDW 14.0 06/30/2023 0241   LYMPHSABS 1.9 06/08/2023 1730   MONOABS 0.8 06/08/2023 1730   EOSABS 0.3 06/08/2023 1730   BASOSABS 0.1 06/08/2023 1730    BMET    Component Value Date/Time   NA 131 (L) 09/13/2023 0339   K 4.1 09/13/2023 0339   CL 104 06/30/2023 0241   CO2 24 06/30/2023 0241   GLUCOSE 150 (H) 06/30/2023 0241   BUN 11 06/30/2023 0241   CREATININE 0.74 06/30/2023 0241   CREATININE 0.62 08/28/2012 0000   CALCIUM  8.2 (L) 06/30/2023 0241   CALCIUM  8.5 08/28/2012 0000   GFRNONAA >60 06/30/2023 0241   GFRAA >60 12/23/2018 0332    INR    Component Value Date/Time   INR 1.0 06/02/2023 1845     Intake/Output Summary (Last 24 hours) at 09/13/2023 0720 Last data filed at 09/13/2023 0548 Gross per 24 hour  Intake 2500 ml  Output 575 ml  Net 1925 ml     Assessment/Plan:  73 y.o. female is s/p primary repair ruptured bovine pericardial patch pseudoaneurysm using previous vein bypass, washout of left groin wound, left common femoral, external iliac and profundofemoral thrombectomy, sartorius muscle flap coverage left common femoral  artery with wound VAC placement * Day of Surgery *   LLE well perfused and warm with doppler PT signal Left groin with wound vac to suction Hgb 8.2. Hypotensive. Will order 1 unit PRBC Pain well controlled Surgical cultures pending Likely will need take back to OR later this week for another washout and VAC change   Lauren Finical, PA-C [facility] [phone] 09/13/2023 7:20 AM  I have independently interviewed and examined patient and agree with PA assessment and plan above.   Lauren King C. Vikki Graves, MD [facility] [place] Office: [phone] Pager: [phone]

## 2023-09-13 NOTE — Plan of Care (Signed)

## 2023-09-13 NOTE — Evaluation (Signed)
 Physical Therapy Evaluation Patient Details Name: Lauren King MRN: [id number] DOB: 1951-03-13 Today's Date: 09/13/2023  History of Present Illness  Lauren King is a 73 y.o. female who  had ruptured L common femoral patch psudoaneurysm now s/p repair with wound vac placement to L groin. PMH: peripheral vascular disease status post numerous aorto and angiograms, eft femoral endarterectomy and bovine pericardial patch placed on January 17 complicated by lymphatic leak requiring wound VAC placement.   Clinical Impression  Pt admitted with above. PTA pt was living with granddaughter in an apartment functioning indep without AD in apartment and mod I with rollator in community. Pt does not drive but was doing her ADLs. Pt is alone while her granddaughter works from 4-6 hrs a day. Pt mobilized well for first time up considering L groin and L wrist pain. Anticipate pt will progress well to be able to return home with granddaughter however pt very interested in moving to where her daughter is as her dtr is the one who cares for her and neither the patient or granddaughter drive.  Acute PT to cont to follow. Recommend HH services to progress mobility without RW as PTA.      If plan is discharge home, recommend the following: A little help with walking and/or transfers;A little help with bathing/dressing/bathroom;Assist for transportation   Can travel by private vehicle        Equipment Recommendations None recommended by PT  Recommendations for Other Services       Functional Status Assessment Patient has had a recent decline in their functional status and demonstrates the ability to make significant improvements in function in a reasonable and predictable amount of time.     Precautions / Restrictions Precautions Precautions: Fall Precaution/Restrictions Comments: wound vac in L groin, a-line in L wrist Restrictions Weight Bearing Restrictions Per Provider Order: No      Mobility   Bed Mobility Overal bed mobility: Needs Assistance Bed Mobility: Supine to Sit     Supine to sit: Min assist     General bed mobility comments: HOB elevated, minA for L LE management due to pain    Transfers Overall transfer level: Needs assistance Equipment used: Rolling walker (2 wheels) Transfers: Sit to/from Stand Sit to Stand: Min assist           General transfer comment: minA to power up and steady during transition of R UE from bed to RW, increased time    Ambulation/Gait Ambulation/Gait assistance: Contact guard assist Gait Distance (Feet): 100 Feet Assistive device: Rolling walker (2 wheels) Gait Pattern/deviations: Step-through pattern, Decreased stride length, Antalgic Gait velocity: dec     General Gait Details: pt with onset of pain with L hip flexion as expected due to L groin wound vac, pt with decreased L LE WBing tolerance, needs RW for safe ambulation at this time  Careers information officer     Tilt Bed    Modified Rankin (Stroke Patients Only)       Balance Overall balance assessment: Needs assistance Sitting-balance support: Feet supported, No upper extremity supported Sitting balance-Leahy Scale: Fair     Standing balance support: Single extremity supported, During functional activity, Reliant on assistive device for balance Standing balance-Leahy Scale: Poor Standing balance comment: reliant on RW at this time  Pertinent Vitals/Pain Pain Assessment Pain Assessment: Faces Faces Pain Scale: Hurts whole lot Pain Location: L groin and L wrist Pain Descriptors / Indicators: Burning, Sharp Pain Intervention(s): Monitored during session    Home Living Family/patient expects to be discharged to:: Private residence Living Arrangements: Other relatives (granchild) Available Help at Discharge: Family;Available PRN/intermittently Type of Home: Apartment Home Access: Elevator;Stairs  to enter       Home Layout: One level Home Equipment: Rollator (4 wheels);Grab bars - tub/shower      Prior Function Prior Level of Function : Independent/Modified Independent             Mobility Comments: pt reports using rollator in community but no AD in apartment ADLs Comments: indep     Extremity/Trunk Assessment   Upper Extremity Assessment Upper Extremity Assessment: Generalized weakness    Lower Extremity Assessment Lower Extremity Assessment: Generalized weakness (L hip flexion limited by wound vac)    Cervical / Trunk Assessment Cervical / Trunk Assessment: Normal  Communication   Communication Communication: No apparent difficulties    Cognition Arousal: Alert Behavior During Therapy: WFL for tasks assessed/performed   PT - Cognitive impairments: No apparent impairments                         Following commands: Intact       Cueing Cueing Techniques: Verbal cues     General Comments General comments (skin integrity, edema, etc.): L groin wound vac, pt very frail    Exercises     Assessment/Plan    PT Assessment Patient needs continued PT services  PT Problem List Decreased strength;Decreased activity tolerance;Decreased balance;Decreased mobility       PT Treatment Interventions DME instruction;Gait training;Functional mobility training;Therapeutic activities;Therapeutic exercise;Balance training    PT Goals (Current goals can be found in the Care Plan section)  Acute Rehab PT Goals Patient Stated Goal: stay with my daughter PT Goal Formulation: With patient Time For Goal Achievement: 09/27/23 Potential to Achieve Goals: Good    Frequency Min 2X/week     Co-evaluation               AM-PAC PT "6 Clicks" Mobility  Outcome Measure Help needed turning from your back to your side while in a flat bed without using bedrails?: A Little Help needed moving from lying on your back to sitting on the side of a flat bed  without using bedrails?: A Little Help needed moving to and from a bed to a chair (including a wheelchair)?: A Little Help needed standing up from a chair using your arms (e.g., wheelchair or bedside chair)?: A Little Help needed to walk in hospital room?: A Little Help needed climbing 3-5 steps with a railing? : A Lot 6 Click Score: 17    End of Session   Activity Tolerance: Patient limited by pain Patient left: in chair;with call bell/phone within reach;with nursing/sitter in room Nurse Communication: Mobility status (RN present to assist with line management) PT Visit Diagnosis: Unsteadiness on feet (R26.81);Muscle weakness (generalized) (M62.81);Difficulty in walking, not elsewhere classified (R26.2)    Time: 0454-0981 PT Time Calculation (min) (ACUTE ONLY): 35 min   Charges:   PT Evaluation $PT Eval Moderate Complexity: 1 Mod PT Treatments $Gait Training: 8-22 mins PT General Charges $$ ACUTE PT VISIT: 1 Visit         Renaee Caro, PT, DPT Acute Rehabilitation Services Secure chat preferred Office #: [phone]   Jenna Moan 09/13/2023, 11:23  AM

## 2023-09-13 NOTE — Anesthesia Procedure Notes (Signed)
 Arterial Line Insertion Start/End5/10/2023 1:53 AM Performed by: Shauna Del, CRNA, CRNA  Patient location: OR. Preanesthetic checklist: patient identified, IV checked, site marked, risks and benefits discussed, surgical consent, monitors and equipment checked, pre-op evaluation, timeout performed and anesthesia consent Patient sedated Left, radial was placed Catheter size: 20 G Maximum sterile barriers used   Attempts: 1 Procedure performed without using ultrasound guided technique. Following insertion, dressing applied and Biopatch. Post procedure assessment: normal  Patient tolerated the procedure well with no immediate complications.

## 2023-09-13 NOTE — H&P (Signed)
 H+P    History of Present Illness: This is a 73 y.o. female with long tree of vascular interventions in January of this year underwent bilateral femoral endarterectomy with bovine pericardial patch angioplasty including right femoral to below-knee popliteal artery bypass with vein and endovascular aortobiiliac stenting.  This was complicated by left groin wound that required takeback to the operating room February with placement of wound VAC which ultimately healed with home health care.  Time for takeback she had culture and Gram stains performed which were negative.  She now has 4 days of bleeding from the left groin with increasing pain.  She denies fevers or chills.  She states that she has had no appetite.  She does take aspirin  Plavix  no other blood thinners.  Past Medical History:  Diagnosis Date   Anxiety    Chronic lower back pain    COPD (chronic obstructive pulmonary disease) (HCC)    Depression    GERD (gastroesophageal reflux disease)    Hypertension    Obstructive sleep apnea    CPAP   Peripheral vascular disease (HCC)     Past Surgical History:  Procedure Laterality Date   ABDOMINAL AORTIC ENDOVASCULAR STENT GRAFT N/A 05/27/2023   Procedure: AORTIC STENTING USING ENDOLOGIX GRAFT;  Surgeon: Margherita Shell, MD;  Location: [facility] OR;  Service: Vascular;  Laterality: N/A;   ABDOMINAL AORTOGRAM W/LOWER EXTREMITY N/A 09/11/2019   Procedure: ABDOMINAL AORTOGRAM W/LOWER EXTREMITY;  Surgeon: Margherita Shell, MD;  Location: [facility];  Service: Cardiovascular;  Laterality: N/A;   ABDOMINAL AORTOGRAM W/LOWER EXTREMITY N/A 10/23/2019   Procedure: ABDOMINAL AORTOGRAM W/LOWER EXTREMITY;  Surgeon: Margherita Shell, MD;  Location: [facility];  Service: Cardiovascular;  Laterality: N/A;   ABDOMINAL AORTOGRAM W/LOWER EXTREMITY N/A 02/02/2022   Procedure: ABDOMINAL AORTOGRAM W/LOWER EXTREMITY;  Surgeon: Margherita Shell, MD;  [facility]: [facility];  Service:  Cardiovascular;  Laterality: N/A;   ABDOMINAL AORTOGRAM W/LOWER EXTREMITY N/A 03/23/2022   Procedure: ABDOMINAL AORTOGRAM W/LOWER EXTREMITY;  Surgeon: Margherita Shell, MD;  [facility]: [facility];  Service: Cardiovascular;  Laterality: N/A;   ABDOMINAL AORTOGRAM W/LOWER EXTREMITY N/A 03/29/2023   Procedure: ABDOMINAL AORTOGRAM W/LOWER EXTREMITY;  Surgeon: Margherita Shell, MD;  [facility]: [facility];  Service: Cardiovascular;  Laterality: N/A;   ABDOMINAL HYSTERECTOMY     BYPASS GRAFT FEMORAL-PERONEAL Right 05/27/2023   Procedure: BYPASS GRAFT RIGHT FEMORAL TO BELOW THE KNEE POPITEAL ARTERY USING NONREVERSED RIGHT GREATER SAPHENOUS VEIN;  Surgeon: Margherita Shell, MD;  Location: [facility] OR;  Service: Vascular;  Laterality: Right;   CATARACT EXTRACTION     COLONOSCOPY  01/20/2005   WGN:FAOZHYQMV colon polyp/Diminutive polyps at 30 cm ablated/Normal rectum. hyperplastic polyp   COLONOSCOPY N/A 03/25/2015   HQI:ONGEXB /left-sided diverticula   ENDARTERECTOMY FEMORAL Left 12/22/2018   Procedure: Endarterectomy Left Femoral and Politeal Arteries;  Surgeon: Margherita Shell, MD;  Location: [facility] OR;  Service: Vascular;  Laterality: Left;   ENDARTERECTOMY FEMORAL Bilateral 05/27/2023   Procedure: RIGHT FEMORAL ENDARTERECTOMY WITH XENSURE BOVINE PERICARDIUM PATCH AND REDO LEFT FEMORAL ENDARTERECTOMY WITH XENSURE BOVINE PERICARDIUM PATCH;  Surgeon: Margherita Shell, MD;  Location: [facility] OR;  Service: Vascular;  Laterality: Bilateral;   ESOPHAGOGASTRODUODENOSCOPY  11/30/2004   RMR:A small patch of salmon-colored epithelium (salmon-like lesion in the distal esophagus)/Antral pre-pyloric erosions of uncertain significance, otherwise, normal. solitary duodenal bulbar AVM.No Barrett's.   ESOPHAGOGASTRODUODENOSCOPY N/A 03/25/2015   MWU:XLKGMWN reflux/schatzkis ring s/p dilation/HH/short segment barrett  ESOPHAGOGASTRODUODENOSCOPY (EGD) WITH ESOPHAGEAL DILATION N/A 09/22/2012   Rourk: erosive RE, biopsy  negative for Barrett's. Gastritis.    FEMORAL-TIBIAL BYPASS GRAFT Left 12/22/2018   Procedure: BYPASS GRAFT FEMORAL-TIBIAL ARTERY LEFT LEG USING NONREVERSED LEFT GREAT SAPHENOUS VEIN;  Surgeon: Margherita Shell, MD;  Location: [facility] OR;  Service: Vascular;  Laterality: Left;   INCISION AND DRAINAGE OF WOUND Left 06/29/2023   Procedure: LEFT GROIN IRRIGATION AND DEBRIDEMENT WOUND;  Surgeon: Carlene Che, MD;  Location: [facility] OR;  Service: Vascular;  Laterality: Left;   LOWER EXTREMITY ANGIOGRAPHY N/A 12/27/2017   Procedure: LOWER EXTREMITY ANGIOGRAPHY;  Surgeon: Margherita Shell, MD;  [facility];  Service: Cardiovascular;  Laterality: N/A;   LOWER EXTREMITY ANGIOGRAPHY N/A 11/21/2018   Procedure: LOWER EXTREMITY ANGIOGRAPHY;  Surgeon: Margherita Shell, MD;  [facility];  Service: Cardiovascular;  Laterality: N/A;   MALONEY DILATION N/A 03/25/2015   Procedure: Londa Rival DILATION;  Surgeon: Suzette Espy, MD;  [facility];  Service: Endoscopy;  Laterality: N/A;   PERIPHERAL VASCULAR BALLOON ANGIOPLASTY  02/02/2022   Procedure: PERIPHERAL VASCULAR BALLOON ANGIOPLASTY;  Surgeon: Margherita Shell, MD;  [facility];  Service: Cardiovascular;;   PERIPHERAL VASCULAR INTERVENTION Left 09/11/2019   Procedure: PERIPHERAL VASCULAR INTERVENTION;  Surgeon: Margherita Shell, MD;  [facility];  Service: Cardiovascular;  Laterality: Left;   PERIPHERAL VASCULAR INTERVENTION Right 10/23/2019   Procedure: PERIPHERAL VASCULAR INTERVENTION;  Surgeon: Margherita Shell, MD;  [facility];  Service: Cardiovascular;  Laterality: Right;  external iliac   PERIPHERAL VASCULAR INTERVENTION  03/23/2022   Procedure: PERIPHERAL VASCULAR INTERVENTION;  Surgeon: Margherita Shell, MD;  [facility];  Service: Cardiovascular;;   PERIPHERAL VASCULAR THROMBECTOMY  03/23/2022   Procedure: PERIPHERAL VASCULAR THROMBECTOMY;   Surgeon: Margherita Shell, MD;  [facility];  Service: Cardiovascular;;   SKIN CANCER DESTRUCTION     THROMBECTOMY FEMORAL ARTERY Right 05/27/2023   Procedure: THROMBECTOMY OF ILIOFEMORAL ARTERY;  Surgeon: Margherita Shell, MD;  Location[facility] OR;  Service: Vascular;  Laterality: Right;   TUBAL LIGATION     with incidental appendectomy   VEIN HARVEST Left 12/22/2018   Procedure: Vein Harvest Left Great Saphenous;  Surgeon: Margherita Shell, MD;  Location: [facility] OR;  Service: Vascular;  Laterality: Left;    No Known Allergies  Prior to Admission medications   Medication Sig Start Date End Date Taking? Authorizing Provider  albuterol  (VENTOLIN  HFA) 108 (90 Base) MCG/ACT inhaler Inhale 1-2 puffs into the lungs every 6 (six) hours as needed for wheezing or shortness of breath.    [provider]  aspirin  EC 81 MG tablet Take 81 mg by mouth daily.    [provider]  benzonatate  (TESSALON  PERLES) 100 MG capsule Take 1 capsule (100 mg total) by mouth 3 (three) times daily as needed for cough. 07/12/23   Baglia, Corrina, PA-C  cephALEXin  (KEFLEX ) 500 MG capsule Take 1 capsule (500 mg total) by mouth 2 (two) times daily. Patient not taking: Reported on 07/12/2023 06/30/23   Butch Cashing, PA-C  clopidogrel  (PLAVIX ) 75 MG tablet Take 1 tablet (75 mg total) by mouth daily. 07/01/23   Cordie Deters, PA-C  gabapentin  (NEURONTIN ) 100 MG capsule Take 1 capsule (100 mg total) by mouth 2 (two) times daily. 07/12/23   Baglia, Corrina, PA-C  losartan  (COZAAR ) 50 MG tablet Take 50 mg by mouth  daily.    [provider]  mirtazapine  (REMERON ) 15 MG tablet Take 15 mg by mouth at bedtime. 04/04/23   [provider]  Multiple Vitamins-Minerals (MULTIVITAMIN WITH MINERALS) tablet Take 1 tablet by mouth daily.    [provider]  Oxycodone  HCl 10 MG TABS Take 10 mg by mouth every 4 (four) hours as needed for severe pain. 10/15/21   [provider]   rosuvastatin  (CRESTOR ) 20 MG tablet Take 20 mg by mouth at bedtime.    [provider]  TRELEGY ELLIPTA 100-62.5-25 MCG/INH AEPB Inhale 1 puff into the lungs daily.    [provider]    Social History   Socioeconomic History   Marital status: Divorced    Spouse name: Not on file   Number of children: 2   Years of education: Not on file   Highest education level: Not on file  Occupational History   Not on file  Tobacco Use   Smoking status: Former    Average packs/day: 0.4 packs/day for 40.0 years (16.0 ttl pk-yrs)    Types: Cigarettes    Start date: 05/11/2022    Quit date: 42    Years since quitting: 53.3   Smokeless tobacco: Never  Vaping Use   Vaping status: Former   Substances: Nicotine   Substance and Sexual Activity   Alcohol  use: Yes    Comment: maybe 1 drink every couple of months   Drug use: Yes    Types: Marijuana    Comment: smokes maybe 2 times a month (says MD is aware)   Sexual activity: Not on file  Other Topics Concern   Not on file  Social History Narrative   Pt has had 5 children pass away. 2 living children   Social Drivers of Corporate investment banker Strain: Not on file  Food Insecurity: No Food Insecurity (06/29/2023)   Hunger Vital Sign    Worried About Running Out of Food in the Last Year: Never true    Ran Out of Food in the Last Year: Never true  Transportation Needs: No Transportation Needs (06/29/2023)   PRAPARE - Administrator, Civil Service (Medical): No    Lack of Transportation (Non-Medical): No  Physical Activity: Not on file  Stress: Not on file  Social Connections: Patient Declined (06/30/2023)   Social Connection and Isolation Panel [NHANES]    Frequency of Communication with Friends and Family: Patient declined    Frequency of Social Gatherings with Friends and Family: Patient declined    Attends Religious Services: Patient declined    Database administrator or Organizations: Patient declined     Attends Banker Meetings: Patient declined    Marital Status: Patient declined  Intimate Partner Violence: Not At Risk (06/29/2023)   Humiliation, Afraid, Rape, and Kick questionnaire    Fear of Current or Ex-Partner: No    Emotionally Abused: No    Physically Abused: No    Sexually Abused: No     Family History  Problem Relation Age of Onset   Lung cancer Sister    Lung cancer Brother    Colon cancer Neg Hx     Review of Systems  Constitutional:  Positive for malaise/fatigue.  HENT: Negative.    Gastrointestinal:        Decreased appetite  Musculoskeletal:        Left groin pain  Skin: Negative.   Neurological: Negative.   Endo/Heme/Allergies:  Bruises/bleeds easily.  Psychiatric/Behavioral: Negative.  Physical Examination  Vitals:   09/12/23 2250  BP: 104/69  Pulse: 88  Resp: 18  Temp: 97.9 F (36.6 C)  SpO2: 100%   Body mass index is 12.25 kg/m.  Physical Exam HENT:     Head: Normocephalic.     Nose: Nose normal.  Eyes:     Pupils: Pupils are equal, round, and reactive to light.  Cardiovascular:     Pulses:          Femoral pulses are 2+ on the right side.      Popliteal pulses are 2+ on the right side.     Comments: Pulsatile left groin mass as pictured below Pulmonary:     Effort: Pulmonary effort is normal.  Skin:    Comments: Appears to have vein harvest incisions of left lower extremity which are all well-healed  Neurological:     Mental Status: She is alert.      CBC    Component Value Date/Time   WBC 6.3 06/30/2023 0241   RBC 3.22 (L) 06/30/2023 0241   HGB 9.2 (L) 06/30/2023 0241   HGB 12.3 08/28/2012 0000   HCT 29.4 (L) 06/30/2023 0241   HCT 36 08/28/2012 0000   PLT 303 06/30/2023 0241   MCV 91.3 06/30/2023 0241   MCV 92.4 08/28/2012 0000   MCH 28.6 06/30/2023 0241   MCHC 31.3 06/30/2023 0241   RDW 14.0 06/30/2023 0241   LYMPHSABS 1.9 06/08/2023 1730   MONOABS 0.8 06/08/2023 1730   EOSABS 0.3 06/08/2023  1730   BASOSABS 0.1 06/08/2023 1730    BMET    Component Value Date/Time   NA 137 06/30/2023 0241   K 4.4 06/30/2023 0241   CL 104 06/30/2023 0241   CO2 24 06/30/2023 0241   GLUCOSE 150 (H) 06/30/2023 0241   BUN 11 06/30/2023 0241   CREATININE 0.74 06/30/2023 0241   CREATININE 0.62 08/28/2012 0000   CALCIUM  8.2 (L) 06/30/2023 0241   CALCIUM  8.5 08/28/2012 0000   GFRNONAA >60 06/30/2023 0241   GFRAA >60 12/23/2018 0332    COAGS: Lab Results  Component Value Date   INR 1.0 06/02/2023   INR 1.0 05/19/2023   INR 1.0 12/19/2018     Non-Invasive Vascular Imaging:   No studies secondary to aki  ASSESSMENT/PLAN: 73 year old female with pulsatile left groin mass consistent with likely patch pseudoaneurysm rupture.  Patient has [facility] at [facility] could not obtain CT with contrast.  Given concerning nature of the pulsatile mass in the left groin will take urgently to the operating room tonight for washout of the left groin with repair of pseudoaneurysm.  Does appear that she has had vein harvested in both lower extremities hopefully can find some vein to use for repairing the patch.  I discussed with urgent nature of this case as well as high risk of morbidity/mortality with the patient and her daughter via telephone and they seem to demonstrate good understanding.  Joshuan Bolander C. Vikki Graves, MD [facility] [place] Office: [phone] Pager: [phone]

## 2023-09-13 NOTE — ED Notes (Signed)
 Pt taken to the PACU.

## 2023-09-13 NOTE — Anesthesia Preprocedure Evaluation (Signed)
 Anesthesia Evaluation  Patient identified by MRN, date of birth, ID band Patient awake    Reviewed: Allergy & Precautions, NPO status , Patient's Chart, lab work & pertinent test results  Airway Mallampati: II  TM Distance: >3 FB Neck ROM: Full    Dental  (+) Dental Advisory Given, Edentulous Upper, Missing   Pulmonary sleep apnea , COPD,  COPD inhaler, former smoker   Pulmonary exam normal breath sounds clear to auscultation       Cardiovascular hypertension, Pt. on medications + Peripheral Vascular Disease (groin wound)  Normal cardiovascular exam Rhythm:Regular Rate:Normal     Neuro/Psych  PSYCHIATRIC DISORDERS Anxiety Depression    TIA Neuromuscular disease    GI/Hepatic Neg liver ROS, hiatal hernia, PUD,GERD  ,,  Endo/Other  negative endocrine ROS    Renal/GU negative Renal ROS     Musculoskeletal negative musculoskeletal ROS (+)    Abdominal   Peds  Hematology  (+) Blood dyscrasia (Plavix )   Anesthesia Other Findings Day of surgery medications reviewed with the patient.  Reproductive/Obstetrics                              Anesthesia Physical Anesthesia Plan  ASA: 4 and emergent  Anesthesia Plan: General   Post-op Pain Management: Ofirmev  IV (intra-op)*   Induction: Intravenous  PONV Risk Score and Plan: 3 and Dexamethasone  and Ondansetron   Airway Management Planned: Oral ETT  Additional Equipment: Arterial line  Intra-op Plan:   Post-operative Plan: Extubation in OR  Informed Consent: I have reviewed the patients History and Physical, chart, labs and discussed the procedure including the risks, benefits and alternatives for the proposed anesthesia with the patient or authorized representative who has indicated his/her understanding and acceptance.     Dental advisory given  Plan Discussed with: CRNA  Anesthesia Plan Comments:          Anesthesia Quick  Evaluation

## 2023-09-13 NOTE — Op Note (Signed)
 Patient name: Lauren King MRN: [id number] DOB: [birth year] Sex: female  09/13/2023 Pre-operative Diagnosis: Ruptured left common femoral patch pseudoaneurysm Post-operative diagnosis:  Same Surgeon:  Ace Holder C. Vikki Graves, MD Assistant: Wynonia Hedges, PA Procedure Performed: 1.  Primary repair of ruptured bovine pericardial patch pseudoaneurysm using previous bypass greater saphenous vein 2.  Washout of left groin wound using pulse evacuation 1 L saline and 1 L of Vashe 3.  Left common femoral, external iliac and profundofemoral thrombectomy 4.  Sartorius muscle flap coverage left common femoral artery 5.  Wound VAC placement left groin wound measuring 6 x 3 cm  Indications: 73 year old female with extensive bilateral lower extremity vascular history.  Most recently on the left side she had common femoral endarterectomy with bovine pericardial patch angioplasty extending from the left common femoral artery down to the profunda femoris artery with a known occluded left superficial femoral artery and femoral to below-knee popliteal artery bypass with vein.  She also has Art gallery manager of her aortic bifurcation as well as stenting of her external iliac artery all the way to the inguinal ligament.  She presented with 4-day history of pain in the left groin with bleeding with concern for disrupted patch in the left groin she was taken emergently to the operating room.  Experience assistant was necessary to facilitate exposure of the common femoral artery as well as gain control proximally and distally and performed patch angioplasty as well as sartorius muscle flap coverage.  Findings: The existing bovine pericardial patch was almost entirely disrupted except distally on the profunda.  We were able to get proximal and distal control using Fogarty balloons and ultimately the patch was entirely removed.  Given the patient has had both veins harvested in her legs in the past we were able to locate  the previous bypass graft and use this as a patch angioplasty and a completion there was a very strong Doppler signal distally in the profunda as well as at the posterior tibial artery at the ankle which was monophasic as expected with known long segment SFA and popliteal artery occlusions.  The wound was washed out and debrided back to healthy tissue and sartorius muscle was placed overlying the common femoral artery and a wound VAC was fashioned at the level of the subcutaneous tissue and skin.   Procedure:  The patient was identified in the holding area and taken to the operating room where she is placed supine operative table and general anesthesia was induced.  She was sterilely prepped and draped in the left groin and entire left lower extremity in the usual fashion, antibiotics were administered and timeout was called.  We began by directly manipulating the existing wound and I was able to get finger control of the common femoral artery and then opened the wound proximally and distally using cautery.  4 Fogarty catheters were then used proximally distally placed in the profunda and the external iliac artery and hemostasis was obtained.  5000 units of heparin  was then administered.  The wound was then thoroughly irrigated.  We then dissected around the common femoral artery to fully expose this and remove the existing bovine pericardial patch and debride the edges of the common femoral artery.  There was a significant amount of pseudoaneurysm capsule which was debrided away from the artery using a combination of cautery and scissors.  The previous bypass graft that originated from the common femoral artery was identified and traced for approximately 8 cm and tied off distally and  transected and oversewed proximally although this was noted to be occluded.  This was then opened longitudinally and appeared to be suitable for a patch and was endarterectomized.  This was then sewn in place as a patch using 5-0  Prolene suture.  Prior to completing the patch we did perform thrombectomy proximally and distally with a 4 Fogarty's and we establish very strong inflow and strong backbleeding from the profunda as there was minimal clot in the profunda but significant clotting in the external iliac artery and common femoral proximally.  We then thoroughly irrigated the common femoral artery bulb with heparinized saline and completed our patch anastomosis and then there was a strong signal noted in the distal profunda and at the posterior tibial artery at the ankle.  The wound was then thoroughly irrigated including under the inguinal ligament where it appeared there was a contained rupture with significant hematoma.  This was done with pulse evacuation using 1 L of saline and then subsequently 1 L of Vashe.  We then debrided away all of the capsule and devitalized tissue including skin and subcutaneous tissue.  And then traced the wound up to the anterior superior iliac spine and divided down directly onto this with cautery identify the sartorius muscle and bluntly mobilized this medially and using cautery laterally to divide the ligament.  This was then rotated medially and was tacked to the inguinal ligament superiorly and dense tissue medially using interrupted 2-0 Vicryl suture.  We were then able to close some of the most cephalad and caudal areas of the wound using staples.  A wound VAC was then fashioned totaling 6 x 3 cm and was connected to suction overlying the remaining wound.  The patient was then awakened from anesthesia having tolerated the procedure without any complication.  All counts were correct at completion.  EBL: 200cc    Elaine Middleton C. Vikki Graves, MD [facility] Office: [phone] Pager: [phone]

## 2023-09-13 NOTE — Anesthesia Procedure Notes (Signed)
 Procedure Name: Intubation Date/Time: 09/13/2023 1:43 AM  Performed by: Christain Courser., CRNAPre-anesthesia Checklist: Patient identified, Emergency Drugs available, Suction available, Patient being monitored and Timeout performed Patient Re-evaluated:Patient Re-evaluated prior to induction Oxygen  Delivery Method: Circle system utilized Preoxygenation: Pre-oxygenation with 100% oxygen  Induction Type: IV induction and Rapid sequence Laryngoscope Size: Mac and 3 Grade View: Grade I Tube type: Oral Tube size: 7.0 mm Number of attempts: 1 Airway Equipment and Method: Stylet Placement Confirmation: ETT inserted through vocal cords under direct vision, positive ETCO2 and breath sounds checked- equal and bilateral Secured at: 20 cm Tube secured with: Tape Dental Injury: Teeth and Oropharynx as per pre-operative assessment

## 2023-09-13 NOTE — Transfer of Care (Addendum)
 Immediate Anesthesia Transfer of Care Note  Patient: Lauren King  Procedure(s) Performed: EXPLORATION, ARTERY, FEMORAL (Left) APPLICATION, WOUND VAC (Left: Groin) THROMBECTOMY, ARTERY, FEMORAL, Left femoral artery patch angioplasty (Left: Groin) Sartorios MUSCLE FLAP (Left: Groin)  Patient Location: PACU  Anesthesia Type:General  Level of Consciousness: awake, alert , and oriented  Airway & Oxygen  Therapy: Patient Spontanous Breathing  Post-op Assessment: Report given to RN and Post -op Vital signs reviewed and stable  Post vital signs: Reviewed and stable  Last Vitals:  Vitals Value Taken Time  BP 115/55 09/13/23 0415  Temp    Pulse 91 09/13/23 0419  Resp 21 09/13/23 0419  SpO2 88 % 09/13/23 0419  Vitals shown include unfiled device data.  Last Pain:  Vitals:   09/13/23 0052  PainSc: 8          Complications: No notable events documented.

## 2023-09-14 ENCOUNTER — Encounter (HOSPITAL_COMMUNITY): Payer: Self-pay | Admitting: Vascular Surgery

## 2023-09-14 LAB — HEMOGLOBIN AND HEMATOCRIT, BLOOD
HCT: 27.2 % — ABNORMAL LOW (ref 36.0–46.0)
HCT: 29.7 % — ABNORMAL LOW (ref 36.0–46.0)
Hemoglobin: 9.2 g/dL — ABNORMAL LOW (ref 12.0–15.0)
Hemoglobin: 9.7 g/dL — ABNORMAL LOW (ref 12.0–15.0)

## 2023-09-14 LAB — CBC
HCT: 20.4 % — ABNORMAL LOW (ref 36.0–46.0)
Hemoglobin: 6.7 g/dL — CL (ref 12.0–15.0)
MCH: 29.1 pg (ref 26.0–34.0)
MCHC: 32.8 g/dL (ref 30.0–36.0)
MCV: 88.7 fL (ref 80.0–100.0)
Platelets: 142 10*3/uL — ABNORMAL LOW (ref 150–400)
RBC: 2.3 MIL/uL — ABNORMAL LOW (ref 3.87–5.11)
RDW: 16.4 % — ABNORMAL HIGH (ref 11.5–15.5)
WBC: 7.1 10*3/uL (ref 4.0–10.5)
nRBC: 0 % (ref 0.0–0.2)

## 2023-09-14 LAB — BASIC METABOLIC PANEL WITH GFR
Anion gap: 5 (ref 5–15)
BUN: 23 mg/dL (ref 8–23)
CO2: 23 mmol/L (ref 22–32)
Calcium: 7.6 mg/dL — ABNORMAL LOW (ref 8.9–10.3)
Chloride: 107 mmol/L (ref 98–111)
Creatinine, Ser: 0.72 mg/dL (ref 0.44–1.00)
GFR, Estimated: >60 mL/min (ref >60)
Glucose, Bld: 160 mg/dL — ABNORMAL HIGH (ref 70–99)
Potassium: 4.1 mmol/L (ref 3.5–5.1)
Sodium: 135 mmol/L (ref 135–145)

## 2023-09-14 LAB — PREPARE RBC (CROSSMATCH)

## 2023-09-14 LAB — MAGNESIUM: Magnesium: 1.9 mg/dL (ref 1.7–2.4)

## 2023-09-14 MED ORDER — SODIUM CHLORIDE 0.9% IV SOLUTION: Freq: Once | INTRAVENOUS | Status: AC

## 2023-09-14 NOTE — Plan of Care (Signed)

## 2023-09-14 NOTE — TOC Initial Note (Signed)
 Transition of Care [facility]) - Initial/Assessment Note    Patient Details  Name: Lauren King MRN: [id number] Date of Birth: 01/08/1951  [facility] CM/SW Contact:    Benjiman Bras, RN Phone Number: [phone] 09/14/2023, 8:09 AM  Clinical Narrative:                  TOC CM spoke to pt and states she lives with grand-dtr. Her dtr assist with her care. Gave permission to speak to dtr and grand-dtr. She has RW and wheelchair. States she has [facility] for [facility].   Will continue to follow for dc needs.  Expected Discharge Plan: Home w Home Health Services Barriers to Discharge: Continued Medical Work up   Patient Goals and CMS Choice Patient states their goals for this hospitalization and ongoing recovery are:: wants to get better CMS Medicare.gov Compare Post Acute Care list provided to:: Patient Choice offered to / list presented to : Patient      Expected Discharge Plan and Services   Discharge Planning Services: CM Consult Post Acute Care Choice: Home Health Living arrangements for the past 2 months: Single Family Home                                      Prior Living Arrangements/Services Living arrangements for the past 2 months: Single Family Home Lives with:: Adult Children Patient language and need for interpreter reviewed:: Yes Do you feel safe going back to the place where you live?: Yes      Need for Family Participation in Patient Care: Yes (Comment) Care giver support system in place?: Yes (comment)   Criminal Activity/Legal Involvement Pertinent to Current Situation/Hospitalization: No - Comment as needed  Activities of Daily Living   ADL Screening (condition at time of admission) Independently performs ADLs?: Yes (appropriate for developmental age) Is the patient deaf or have difficulty hearing?: No Does the patient have difficulty seeing, even when wearing glasses/contacts?: No Does the patient have difficulty concentrating,  remembering, or making decisions?: No  Permission Sought/Granted Permission sought to share information with : Case Manager, Family Supports, PCP Permission granted to share information with : Yes, Verbal Permission Granted              Emotional Assessment Appearance:: Appears stated age Attitude/Demeanor/Rapport: Engaged Affect (typically observed): Accepting Orientation: : Oriented to Self, Oriented to Place, Oriented to  Time, Oriented to Situation   Psych Involvement: No (comment)  Admission diagnosis:  Pulsatile groin mass [R19.09] Status post vascular surgery [Z98.890] Patient Active Problem List   Diagnosis Date Noted   Status post vascular surgery 09/13/2023   Non-healing surgical wound 06/29/2023   TIA (transient ischemic attack) 06/02/2023   HTN (hypertension) 06/02/2023   HLD (hyperlipidemia) 06/02/2023   OSA (obstructive sleep apnea) 06/02/2023   Critical limb ischemia of right lower extremity (HCC) 05/27/2023   Aortoiliac occlusive disease (HCC) 05/27/2023   COPD (chronic obstructive pulmonary disease) (HCC) 01/05/2022   Delusions (HCC) 01/03/2022   Hyponatremia 01/03/2022   Urinary tract infection in elderly patient 01/03/2022   PAD (peripheral artery disease) (HCC) 12/22/2018   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/11/2018   Major depressive disorder, single episode 03/22/2017   Diverticulosis of colon without hemorrhage    Reflux esophagitis    Schatzki's ring    Gastric ulceration    Hiatal hernia  Encounter for screening colonoscopy 02/06/2015   Constipation 01/02/2015   Esophageal dysphagia 09/21/2012   GERD (gastroesophageal reflux disease) 09/21/2012   Loss of weight 09/21/2012   PCP:  Marianne Shirts, MD Pharmacy:   [facility] - [place], [place] - [address] [place] [address] Phone: [phone] Fax: [phone]  [facility] - [place], [place] - [address] [place] [address] Phone: [phone] Fax: [phone]     Social Drivers of Health (SDOH) Social History: SDOH Screenings   Food Insecurity: No Food Insecurity (09/13/2023)  Housing: Low Risk  (09/13/2023)  Transportation Needs: No Transportation Needs (09/13/2023)  Utilities: Not At Risk (09/13/2023)  Social Connections: Patient Declined (09/13/2023)  Tobacco Use: Medium Risk (07/12/2023)   SDOH Interventions:     Readmission Risk Interventions    06/30/2023    2:13 PM 05/30/2023    2:19 PM  Readmission Risk Prevention Plan  Post Dischage Appt  Complete  Medication Screening  Complete  Transportation Screening  Complete  Home Care Screening Complete   Medication Review (RN CM) Complete

## 2023-09-14 NOTE — Anesthesia Postprocedure Evaluation (Signed)
 Anesthesia Post Note  Patient: Lauren King  Procedure(s) Performed: EXPLORATION, ARTERY, FEMORAL (Left) APPLICATION, WOUND VAC (Left: Groin) THROMBECTOMY, ARTERY, FEMORAL, Left femoral artery patch angioplasty (Left: Groin) Sartorios MUSCLE FLAP (Left: Groin)     Patient location during evaluation: SICU Anesthesia Type: General Level of consciousness: awake and alert Pain management: pain level controlled Vital Signs Assessment: post-procedure vital signs reviewed and stable Respiratory status: spontaneous breathing, nonlabored ventilation, respiratory function stable and patient connected to nasal cannula oxygen  Cardiovascular status: blood pressure returned to baseline and stable Postop Assessment: no apparent nausea or vomiting Anesthetic complications: no   No notable events documented.  Last Vitals:  Vitals:   09/14/23 0600 09/14/23 0700  BP: (!) 85/47 (!) 97/51  Pulse: 72 67  Resp: 14 15  Temp:    SpO2: 97% 97%    Last Pain:  Vitals:   09/14/23 0500  TempSrc:   PainSc: 0-No pain                 Erin Havers

## 2023-09-14 NOTE — Evaluation (Signed)
 Occupational Therapy Evaluation Patient Details Name: Lauren King MRN: [id number] DOB: [birth year] Today's Date: 09/14/2023   History of Present Illness   Lauren King is a 73 y.o. female admitted to [facility] 5/5 with ruptured L common femoral patch psudoaneurysm now s/p primary repair ruptured bovine pericardial patch pseudoaneurysm using previous vein bypass, washout of left groin wound, left common femoral, external iliac and profundofemoral thrombectomy, sartorius muscle flap coverage left common femoral artery with wound VAC placement on 5/6. PMH: peripheral vascular disease status post numerous aorto and angiograms, eft femoral endarterectomy and bovine pericardial patch placed on January 17 complicated by lymphatic leak requiring wound VAC placement.     Clinical Impressions At baseline, pt is Independent with ADLs and most IADLs and performs functional mobility Independent to Mod I with a Rollator. At baseline, pt  receives assistance from family for transportation and other IADLs PRN. Pt now presents with decreased activity tolerance, increased pain in L LE, decreased balance, generalized B UE weakness, and decreased safety and independence with functional tasks. Pt currently demonstrates ability to complete UB ADLs Independent to Contact guard assist, LB ADLs with Min to Mod assist, and functional mobility/transfers with a RW with Min assist. Pt O2 sat >/ 92% on 2L continous O2 through nasal cannula throughout session. Pt HR between mid-70s and mid-90s during session. Pt participated well in session and is motivated to return to PLOF. Pt will benefit from acute skilled OT services to address deficits outlined below and to increase safety and independence with functional tasks. Post acute discharge, pt will benefit from [facility] OT to maximize rehab potential.     If plan is discharge home, recommend the following:   A little help with walking and/or transfers;A lot of help with  bathing/dressing/bathroom;Assistance with cooking/housework;Assist for transportation;Help with stairs or ramp for entrance     Functional Status Assessment   Patient has had a recent decline in their functional status and demonstrates the ability to make significant improvements in function in a reasonable and predictable amount of time.     Equipment Recommendations   BSC/3in1     Recommendations for Other Services         Precautions/Restrictions   Precautions Precautions: Fall Recall of Precautions/Restrictions: Intact Precaution/Restrictions Comments: wound vac in L groin, a-line in L wrist Restrictions Weight Bearing Restrictions Per Provider Order: No     Mobility Bed Mobility Overal bed mobility: Needs Assistance Bed Mobility: Supine to Sit     Supine to sit: Contact guard, Min assist, HOB elevated     General bed mobility comments: assist with L LE management due to pain    Transfers Overall transfer level: Needs assistance Equipment used: Rolling walker (2 wheels) Transfers: Sit to/from Stand, Bed to chair/wheelchair/BSC Sit to Stand: Min assist     Step pivot transfers: Min assist     General transfer comment: Min assist to power up and steady      Balance Overall balance assessment: Needs assistance Sitting-balance support: No upper extremity supported, Feet supported Sitting balance-Leahy Scale: Fair     Standing balance support: Single extremity supported, Bilateral upper extremity supported, During functional activity, Reliant on assistive device for balance Standing balance-Leahy Scale: Poor Standing balance comment: reliant on RW at this time                           ADL either performed or assessed with clinical judgement   ADL Overall ADL's :  Needs assistance/impaired Eating/Feeding: Independent;Sitting   Grooming: Set up;Sitting   Upper Body Bathing: Set up;Sitting   Lower Body Bathing: Minimal  assistance;Moderate assistance;Sitting/lateral leans;Sit to/from stand;Cueing for compensatory techniques   Upper Body Dressing : Contact guard assist;Sitting Upper Body Dressing Details (indicate cue type and reason): assist for managing lines Lower Body Dressing: Moderate assistance;Sitting/lateral leans;Sit to/from stand;Cueing for compensatory techniques   Toilet Transfer: Minimal assistance;BSC/3in1;Rolling walker (2 wheels) (step-pivot transfer) Toilet Transfer Details (indicate cue type and reason): simulated bed to chair Toileting- Clothing Manipulation and Hygiene: Minimal assistance;Sit to/from stand         General ADL Comments: Pt with decreased activity tolerance during funcitonal tasks.     Vision Baseline Vision/History: 1 Wears glasses (readers) Ability to See in Adequate Light: 0 Adequate Patient Visual Report: No change from baseline       Perception         Praxis         Pertinent Vitals/Pain Pain Assessment Pain Assessment: Faces Faces Pain Scale: Hurts little more Pain Location: L groin and L wrist Pain Descriptors / Indicators: Burning, Sharp, Discomfort, Grimacing Pain Intervention(s): Limited activity within patient's tolerance, Monitored during session, Premedicated before session, Repositioned     Extremity/Trunk Assessment Upper Extremity Assessment Upper Extremity Assessment: Right hand dominant;Generalized weakness   Lower Extremity Assessment Lower Extremity Assessment: Defer to PT evaluation   Cervical / Trunk Assessment Cervical / Trunk Assessment: Normal   Communication Communication Communication: No apparent difficulties   Cognition Arousal: Alert Behavior During Therapy: WFL for tasks assessed/performed Cognition: No apparent impairments             OT - Cognition Comments: Pt AAOx4 and pleasant throughout session. Pt cognition WFL for tasks assessed with pt demostrating good insight into deficits and good safety  awareness.                 Following commands: Intact       Cueing  General Comments   Cueing Techniques: Verbal cues  O2 sat >/ 92% on 2L continous O2 through nasal cannula throughout session. HR between mid-70s and mid-90s during session.   Exercises     Shoulder Instructions      Home Living Family/patient expects to be discharged to:: Private residence Living Arrangements: Other relatives Engineer, building services) Available Help at Discharge: Family;Available PRN/intermittently Type of Home: Apartment Home Access: Elevator;Stairs to enter Entrance Stairs-Number of Steps: flight Entrance Stairs-Rails: Right;Left Home Layout: One level     Bathroom Shower/Tub: Chief Strategy Officer: Standard Bathroom Accessibility: Yes How Accessible: Accessible via walker Home Equipment: Rollator (4 wheels);Grab bars - tub/shower   Additional Comments: Pt's daughter also assists PRN. Pt's daughter had previously lived with pt but recently moved to another apartment. Pt hopes to move in with daughter again once her current lease is completed.      Prior Functioning/Environment Prior Level of Function : Independent/Modified Independent             Mobility Comments: Mod I with Rollator in the community and does not use AD in apartment ADLs Comments: Independent with ADLs and most IADLs; family assists with transportation and other IADLs PRN    OT Problem List: Decreased strength;Decreased activity tolerance;Impaired balance (sitting and/or standing);Cardiopulmonary status limiting activity   OT Treatment/Interventions: Self-care/ADL training;Therapeutic exercise;Energy conservation;DME and/or AE instruction;Therapeutic activities;Patient/family education;Balance training      OT Goals(Current goals can be found in the care plan section)   Acute Rehab OT Goals Patient Stated  Goal: for wound in L groin to heal and to return home with family OT Goal Formulation: With  patient Time For Goal Achievement: 09/28/23 Potential to Achieve Goals: Good ADL Goals Pt Will Perform Grooming: with supervision;standing Pt Will Perform Lower Body Bathing: with supervision;sitting/lateral leans;sit to/from stand Pt Will Perform Lower Body Dressing: with contact guard assist;sit to/from stand;sitting/lateral leans Pt Will Transfer to Toilet: with supervision;ambulating;regular height toilet (with least restrictive AD) Pt Will Perform Toileting - Clothing Manipulation and hygiene: with modified independence;sitting/lateral leans;sit to/from stand Pt/caregiver will Perform Home Exercise Program: Increased strength;Both right and left upper extremity;With theraband;Independently;With written HEP provided (increased activity tolerance)   OT Frequency:  Min 2X/week    Co-evaluation              AM-PAC OT "6 Clicks" Daily Activity     Outcome Measure Help from another person eating meals?: None Help from another person taking care of personal grooming?: A Little Help from another person toileting, which includes using toliet, bedpan, or urinal?: A Little Help from another person bathing (including washing, rinsing, drying)?: A Lot Help from another person to put on and taking off regular upper body clothing?: A Little Help from another person to put on and taking off regular lower body clothing?: A Lot 6 Click Score: 17   End of Session Equipment Utilized During Treatment: Rolling walker (2 wheels);Oxygen ;Gait belt;Other (comment) (wound vac) Nurse Communication: Mobility status;Other (comment) (Pt sitting in recliner.)  Activity Tolerance: Patient tolerated treatment well Patient left: in chair;with call bell/phone within reach  OT Visit Diagnosis: Unsteadiness on feet (R26.81);Other abnormalities of gait and mobility (R26.89);Muscle weakness (generalized) (M62.81);Other (comment) (decreased activity tolerance)                Time: 7425-9563 OT Time Calculation  (min): 25 min Charges:  OT General Charges $OT Visit: 1 Visit OT Evaluation $OT Eval Low Complexity: 1 Low OT Treatments $Self Care/Home Management : 8-22 mins  Sinda Leedom "Darral Ellis., OTR/L, MA Acute Rehab [phone]   Walt Gunner 09/14/2023, 1:46 PM

## 2023-09-14 NOTE — Progress Notes (Addendum)
  Progress Note    09/14/2023 8:16 AM 1 Day Post-Op  Subjective: Resting, endorses some left groin soreness.  Denies any left foot pain   Vitals:   09/14/23 0745 09/14/23 0750  BP:    Pulse: 76 84  Resp: 11 17  Temp:    SpO2: 94% 93%    Physical Exam: General: A&O x 3 Cardiac:  regular Lungs:  nonlabored Incisions: Left groin with wound VAC with good seal, SS output Extremities: LLE warm and well-perfused with brisk PT Doppler signal.  Intact motor and sensation of left foot  CBC    Component Value Date/Time   WBC 7.1 09/14/2023 0350   RBC 2.30 (L) 09/14/2023 0350   HGB 6.7 (LL) 09/14/2023 0350   HGB 12.3 08/28/2012 0000   HCT 20.4 (L) 09/14/2023 0350   HCT 36 08/28/2012 0000   PLT 142 (L) 09/14/2023 0350   MCV 88.7 09/14/2023 0350   MCV 92.4 08/28/2012 0000   MCH 29.1 09/14/2023 0350   MCHC 32.8 09/14/2023 0350   RDW 16.4 (H) 09/14/2023 0350   LYMPHSABS 1.9 06/08/2023 1730   MONOABS 0.8 06/08/2023 1730   EOSABS 0.3 06/08/2023 1730   BASOSABS 0.1 06/08/2023 1730    BMET    Component Value Date/Time   NA 135 09/14/2023 0350   K 4.1 09/14/2023 0350   CL 107 09/14/2023 0350   CO2 23 09/14/2023 0350   GLUCOSE 160 (H) 09/14/2023 0350   BUN 23 09/14/2023 0350   CREATININE 0.72 09/14/2023 0350   CREATININE 0.62 08/28/2012 0000   CALCIUM  7.6 (L) 09/14/2023 0350   CALCIUM  8.5 08/28/2012 0000   GFRNONAA >60 09/14/2023 0350   GFRAA >60 12/23/2018 0332    INR    Component Value Date/Time   INR 1.0 06/02/2023 1845     Intake/Output Summary (Last 24 hours) at 09/14/2023 0816 Last data filed at 09/14/2023 0700 Gross per 24 hour  Intake 2092.05 ml  Output 2060 ml  Net 32.05 ml      Assessment/Plan:  73 y.o. female is 1 day postop, s/p: primary repair ruptured bovine pericardial patch pseudoaneurysm using previous vein bypass, washout of left groin wound, left common femoral, external iliac and profundofemoral thrombectomy, sartorius muscle flap coverage  left common femoral artery with wound VAC placement    - She endorses some left groin soreness.  Otherwise she is doing well - Left groin with wound VAC with good seal.  VAC put out 100 cc SS output overnight - Left lower extremity warm well-perfused with brisk PT Doppler signal - Hgb at 6.7 this morning after 1 unit PRBCs yesterday.  Will order another unit of blood today - Surgical cultures from the left groin pending - Likely will take back to the OR later this week for [facility] change   Lauren Finlay, PA-C [facility] [phone] 09/14/2023 8:16 AM   I agree with the above.  Received 1 u pRBC this am.  Plan for ambulation this afternoon.  Keep A-line until tomorrow.  Likely to [facility] tomorrow.  Follow up cx  Lauren King

## 2023-09-15 ENCOUNTER — Encounter (HOSPITAL_COMMUNITY): Payer: Self-pay | Admitting: Vascular Surgery

## 2023-09-15 LAB — CBC
HCT: 28.2 % — ABNORMAL LOW (ref 36.0–46.0)
Hemoglobin: 9.2 g/dL — ABNORMAL LOW (ref 12.0–15.0)
MCH: 29.2 pg (ref 26.0–34.0)
MCHC: 32.6 g/dL (ref 30.0–36.0)
MCV: 89.5 fL (ref 80.0–100.0)
Platelets: 209 10*3/uL (ref 150–400)
RBC: 3.15 MIL/uL — ABNORMAL LOW (ref 3.87–5.11)
RDW: 17.1 % — ABNORMAL HIGH (ref 11.5–15.5)
WBC: 8.9 10*3/uL (ref 4.0–10.5)
nRBC: 0 % (ref 0.0–0.2)

## 2023-09-15 LAB — BPAM RBC
Blood Product Expiration Date: 202505172359
Blood Product Expiration Date: 202505192359
ISSUE DATE / TIME: 202505060837
ISSUE DATE / TIME: 202505070741
Unit Type and Rh: 9500
Unit Type and Rh: 9500

## 2023-09-15 LAB — TYPE AND SCREEN
ABO/RH(D): O NEG
Antibody Screen: NEGATIVE
Unit division: 0
Unit division: 0

## 2023-09-15 LAB — BASIC METABOLIC PANEL WITH GFR
Anion gap: 9 (ref 5–15)
BUN: 17 mg/dL (ref 8–23)
CO2: 22 mmol/L (ref 22–32)
Calcium: 8 mg/dL — ABNORMAL LOW (ref 8.9–10.3)
Chloride: 106 mmol/L (ref 98–111)
Creatinine, Ser: 0.7 mg/dL (ref 0.44–1.00)
GFR, Estimated: >60 mL/min (ref >60)
Glucose, Bld: 110 mg/dL — ABNORMAL HIGH (ref 70–99)
Potassium: 4.4 mmol/L (ref 3.5–5.1)
Sodium: 137 mmol/L (ref 135–145)

## 2023-09-15 LAB — MAGNESIUM: Magnesium: 1.8 mg/dL (ref 1.7–2.4)

## 2023-09-15 MED ORDER — SODIUM CHLORIDE 0.9 % IV SOLN
2.0000 g | INTRAVENOUS | Status: DC
  Administered 2023-09-15 – 2023-09-17 (×3): 2 g via INTRAVENOUS
  Filled 2023-09-15 (×4): qty 20

## 2023-09-15 NOTE — Progress Notes (Signed)
 Pt transferred to 4E04 by this RN without event. Met by pt's RN in room.

## 2023-09-15 NOTE — Plan of Care (Signed)

## 2023-09-15 NOTE — Progress Notes (Signed)
 Physical Therapy Treatment Patient Details Name: Lauren King MRN: [id number] DOB: 1951-01-27 Today's Date: 09/15/2023   History of Present Illness 73 y.o. female admitted to [facility] 09/13/23 with ruptured L common femoral patch pseudoaneurysm, s/p repair with wound vac placement to L groin. PMH: PVD, s/p numerous aorto and angiograms, L femoral endarterectomy and bovine pericardial patch placed on January 17 complicated by lymphatic leak requiring wound VAC placement, COPD, HTN, OSA, anxiety/depression.    PT Comments  Pt in recliner upon arrival and agreeable to PT session. Pt progressed in today's session by needing less assistance to stand and increasing gait distance. Pt required supervision for safety to stand and ambulate 219ft with a RW. Pt is progressing well towards goals. D/c recs remain appropriate at this time. Acute PT to follow.   66-70 BPM SpO2 >95% on 2L when pleth reading was accurate    If plan is discharge home, recommend the following: A little help with walking and/or transfers;A little help with bathing/dressing/bathroom;Assist for transportation   Can travel by private vehicle      Yes  Equipment Recommendations  None recommended by PT       Precautions / Restrictions Precautions Precautions: Fall Recall of Precautions/Restrictions: Intact Precaution/Restrictions Comments: wound vac in L groin Restrictions Weight Bearing Restrictions Per Provider Order: No     Mobility  Bed Mobility  General bed mobility comments: in recliner upon arrival    Transfers Overall transfer level: Needs assistance Equipment used: Rolling walker (2 wheels) Transfers: Sit to/from Stand Sit to Stand: Supervision  General transfer comment: supervision for safety, cues for hand placement    Ambulation/Gait Ambulation/Gait assistance: Supervision Gait Distance (Feet): 250 Feet Assistive device: Rolling walker (2 wheels) Gait Pattern/deviations: Step-through pattern, Decreased  stride length, Decreased step length - left Gait velocity: decr     General Gait Details: slow and steady gait, slightly decreased LLE step length.      Balance Overall balance assessment: Needs assistance Sitting-balance support: No upper extremity supported, Feet supported Sitting balance-Leahy Scale: Fair     Standing balance support: Single extremity supported, Bilateral upper extremity supported, During functional activity, Reliant on assistive device for balance Standing balance-Leahy Scale: Fair Standing balance comment: able to stand with no AD, RW for gait       Communication Communication Communication: No apparent difficulties  Cognition Arousal: Alert Behavior During Therapy: WFL for tasks assessed/performed   PT - Cognitive impairments: No apparent impairments   Following commands: Intact      Cueing Cueing Techniques: Verbal cues  Exercises Other Exercises Other Exercises: x5 serial STS with supervision        Pertinent Vitals/Pain Pain Assessment Pain Assessment: Faces Faces Pain Scale: Hurts little more Pain Location: L groin Pain Descriptors / Indicators: Burning, Sharp, Discomfort, Grimacing Pain Intervention(s): Limited activity within patient's tolerance, Monitored during session, Repositioned     PT Goals (current goals can now be found in the care plan section) Acute Rehab PT Goals PT Goal Formulation: With patient Time For Goal Achievement: 09/27/23 Potential to Achieve Goals: Good Progress towards PT goals: Progressing toward goals    Frequency    Min 2X/week       AM-PAC PT "6 Clicks" Mobility   Outcome Measure  Help needed turning from your back to your side while in a flat bed without using bedrails?: A Little Help needed moving from lying on your back to sitting on the side of a flat bed without using bedrails?: A Little Help needed  moving to and from a bed to a chair (including a wheelchair)?: A Little Help needed  standing up from a chair using your arms (e.g., wheelchair or bedside chair)?: A Little Help needed to walk in hospital room?: A Little Help needed climbing 3-5 steps with a railing? : A Lot 6 Click Score: 17    End of Session Equipment Utilized During Treatment: Oxygen  Activity Tolerance: Patient tolerated treatment well Patient left: in chair;with call bell/phone within reach Nurse Communication: Mobility status PT Visit Diagnosis: Unsteadiness on feet (R26.81);Muscle weakness (generalized) (M62.81);Difficulty in walking, not elsewhere classified (R26.2)     Time: 8119-1478 PT Time Calculation (min) (ACUTE ONLY): 30 min  Charges:    $Gait Training: 8-22 mins $Therapeutic Activity: 8-22 mins PT General Charges $$ ACUTE PT VISIT: 1 Visit                    Orysia Blas, PT, DPT Secure Chat Preferred  Rehab Office [phone]   Alissa April Adela Ades 09/15/2023, 3:20 PM

## 2023-09-15 NOTE — Progress Notes (Addendum)
  Progress Note    09/15/2023 8:40 AM 2 Days Post-Op  Subjective: She says that she is feeling better, but her left groin is still sore    Vitals:   09/15/23 0600 09/15/23 0700  BP: (!) 113/54 (!) 131/58  Pulse: 71 71  Resp: 15 12  Temp:  98 F (36.7 C)  SpO2: 96% 95%    Physical Exam: General: Resting comfortably, alert and oriented x 3 Cardiac: Regular Lungs: Nonlabored Incisions: Left groin with wound VAC with good seal, SS output in canister Extremities: Brisk left PT Doppler signal  CBC    Component Value Date/Time   WBC 8.9 09/15/2023 0459   RBC 3.15 (L) 09/15/2023 0459   HGB 9.2 (L) 09/15/2023 0459   HGB 12.3 08/28/2012 0000   HCT 28.2 (L) 09/15/2023 0459   HCT 36 08/28/2012 0000   PLT 209 09/15/2023 0459   MCV 89.5 09/15/2023 0459   MCV 92.4 08/28/2012 0000   MCH 29.2 09/15/2023 0459   MCHC 32.6 09/15/2023 0459   RDW 17.1 (H) 09/15/2023 0459   LYMPHSABS 1.9 06/08/2023 1730   MONOABS 0.8 06/08/2023 1730   EOSABS 0.3 06/08/2023 1730   BASOSABS 0.1 06/08/2023 1730    BMET    Component Value Date/Time   NA 137 09/15/2023 0459   K 4.4 09/15/2023 0459   CL 106 09/15/2023 0459   CO2 22 09/15/2023 0459   GLUCOSE 110 (H) 09/15/2023 0459   BUN 17 09/15/2023 0459   CREATININE 0.70 09/15/2023 0459   CREATININE 0.62 08/28/2012 0000   CALCIUM  8.0 (L) 09/15/2023 0459   CALCIUM  8.5 08/28/2012 0000   GFRNONAA >60 09/15/2023 0459   GFRAA >60 12/23/2018 0332    INR    Component Value Date/Time   INR 1.0 06/02/2023 1845     Intake/Output Summary (Last 24 hours) at 09/15/2023 0840 Last data filed at 09/15/2023 0700 Gross per 24 hour  Intake 792 ml  Output 2700 ml  Net -1908 ml      Assessment/Plan:  73 y.o. female is 2 days postop, s/p:primary repair ruptured bovine pericardial patch pseudoaneurysm using previous vein bypass, washout of left groin wound, left common femoral, external iliac and profundofemoral thrombectomy, sartorius muscle flap  coverage left common femoral artery with wound VAC placement    - The patient's condition continues to improve.  She has improved left groin soreness -Left groin wound VAC with good seal and serosanguineous output -Her hemoglobin is improved after another unit of blood yesterday.  Hemoglobin is 9.2 this morning. -Left lower extremity warm and well-perfused with brisk PT Doppler signal -Surgical cultures pending -Will discontinue Foley and A-line today.  Will transfer to floor. -Plan for left groin wound VAC change at the bedside tomorrow morning   [name], PA-C [facility] [phone] 09/15/2023 8:40 AM  I agree with the above.  I have seen and evaluated the patient.  She has done very well overnight.  Will plan on removing her A-line and Foley catheter today with transfer to the floor.  Continue to follow-up cultures.  Plan for bedside VAC change tomorrow  Gareld June

## 2023-09-15 NOTE — Plan of Care (Signed)
  Problem: Education: Goal: Knowledge of General Education information will improve Description: Including pain rating scale, medication(s)/side effects and non-pharmacologic comfort measures 09/15/2023 1908 by Gene Kemps, RN Outcome: Progressing 09/15/2023 1908 by Gene Kemps, RN Outcome: Progressing   Problem: Health Behavior/Discharge Planning: Goal: Ability to manage health-related needs will improve 09/15/2023 1908 by Gene Kemps, RN Outcome: Progressing 09/15/2023 1908 by Gene Kemps, RN Outcome: Progressing   Problem: Clinical Measurements: Goal: Ability to maintain clinical measurements within normal limits will improve 09/15/2023 1908 by Gene Kemps, RN Outcome: Progressing 09/15/2023 1908 by Gene Kemps, RN Outcome: Progressing Goal: Will remain free from infection 09/15/2023 1908 by Gene Kemps, RN Outcome: Progressing 09/15/2023 1908 by Gene Kemps, RN Outcome: Progressing Goal: Diagnostic test results will improve 09/15/2023 1908 by Gene Kemps, RN Outcome: Progressing 09/15/2023 1908 by Gene Kemps, RN Outcome: Progressing Goal: Respiratory complications will improve 09/15/2023 1908 by Gene Kemps, RN Outcome: Progressing 09/15/2023 1908 by Gene Kemps, RN Outcome: Progressing Goal: Cardiovascular complication will be avoided 09/15/2023 1908 by Gene Kemps, RN Outcome: Progressing 09/15/2023 1908 by Gene Kemps, RN Outcome: Progressing

## 2023-09-16 NOTE — TOC Transition Note (Signed)
 Transition of Care [facility]) - Discharge Note Sherin Dingwall RN, BSN Transitions of Care Unit 4E- RN Case Manager See Treatment Team for direct phone #   Patient Details  Name: Lauren King MRN: [id number] Date of Birth: 12-Dec-1950  Transition of Care [facility]) CM/SW Contact:  Rox Cope, RN Phone Number: 09/16/2023, 2:42 PM   Clinical Narrative:    Pt potentially ready for transition home later today per MD note if [facility] and home VAC arranged.   Pt has [facility] HMO plan and DME contracts go through [employer] for all DME needs- home VAC will need to be approved through [employer] for home VAC. Order has been placed and forms signed that [employer] needs to get approval. Approval pending.   CM spoke w/ pt at bedside, discussed home [facility] approval w/ [employer] per insurance contract (pt has had wound VAC in past w/ [employer]) insurance changed in Feb.2025.  List provided for [facility] choice Per CMS guidelines from [employer] website with star ratings (copy placed in shadow chart)- pt has used [facility] in past however pt would be better served by another agency due to [employer]. Pt has voiced she has also used [facility] before and would try them again.   No other DME needs, pt has needed DME at home. Family will transport home.   Call made to Dolanda w/ [employer] for home Wound VAC needs- order form emailed to [email or web address]  Call made to [facility] for [facility] needs- plan for home wound VAC changes 2x week- referral has been accepted by [facility] with start of care first of next week.    Final next level of care: Home w Home Health Services Barriers to Discharge: Continued Medical Work up   Patient Goals and CMS Choice Patient states their goals for this hospitalization and ongoing recovery are:: wants to get better CMS Medicare.gov Compare Post Acute Care list provided to:: Patient Choice offered to / list presented to : Patient      Discharge Placement                        Discharge Plan and Services Additional resources added to the After Visit Summary for     Discharge Planning Services: CM Consult Post Acute Care Choice: Durable Medical Equipment, Home Health          DME Arranged: Vac DME Agency: [employer] Date DME Agency Contacted: 09/16/23 Time DME Agency Contacted: 1300 Representative spoke with at DME Agency: Cornel Diesel HH Arranged: RN HH Agency: [facility] Date [facility] Agency Contacted: 09/16/23 Time HH Agency Contacted: 1300 Representative spoke with at [facility] Agency: Randel Buss  Social Drivers of Health (SDOH) Interventions SDOH Screenings   Food Insecurity: No Food Insecurity (09/13/2023)  Housing: Low Risk  (09/13/2023)  Transportation Needs: No Transportation Needs (09/13/2023)  Utilities: Not At Risk (09/13/2023)  Social Connections: Patient Declined (09/13/2023)  Tobacco Use: Medium Risk (09/15/2023)     Readmission Risk Interventions    09/16/2023    2:41 PM 06/30/2023    2:13 PM 05/30/2023    2:19 PM  Readmission Risk Prevention Plan  Post Dischage Appt   Complete  Medication Screening   Complete  Transportation Screening Complete  Complete  Home Care Screening  Complete   Medication Review (RN CM)  Complete   HRI or Home Care Consult Complete    Social Work Consult for Recovery Care Planning/Counseling Complete    Palliative Care Screening Not Applicable    Medication Review (  RN Care Manager) Complete

## 2023-09-16 NOTE — Plan of Care (Signed)

## 2023-09-16 NOTE — Progress Notes (Addendum)
 Mobility Specialist Progress Note:    09/16/23 0906  Mobility  Activity Ambulated with assistance in room;Ambulated with assistance in hallway  Level of Assistance Contact guard assist, steadying assist  Assistive Device Front wheel walker  Distance Ambulated (ft) 200 ft  Activity Response Tolerated well  Mobility Referral Yes  Mobility visit 1 Mobility  Mobility Specialist Start Time (ACUTE ONLY) 0849  Mobility Specialist Stop Time (ACUTE ONLY) 0904  Mobility Specialist Time Calculation (min) (ACUTE ONLY) 15 min   Pt received dangling EOB, eager for mobility session. Ambulated in hallway with RW and MinG. Tolerated well, unreliable pleth throughout, however, pt denies SOB or dizziness. Returned pt to room with all needs met, call bell in  reach. Eager for d/c.    Jansel Vonstein Mobility Specialist Please contact via SecureChat or  Rehab office at [phone]

## 2023-09-16 NOTE — Care Management Important Message (Signed)
 Important Message  Patient Details  Name: Lauren King MRN: [id number] Date of Birth: [birth year]   Important Message Given:  Yes - Medicare IM     Felix Host 09/16/2023, 2:32 PM

## 2023-09-16 NOTE — Progress Notes (Addendum)
  Progress Note    09/16/2023 7:44 AM 3 Days Post-Op  Subjective:  soreness L groin   Vitals:   09/15/23 2348 09/16/23 0724  BP: (!) 118/91 120/68  Pulse: 79 81  Resp: 19 14  Temp: 97.6 F (36.4 C) 97.9 F (36.6 C)  SpO2: 99% 96%   Physical Exam: Lungs:  non labored Incisions:  L groin with good seal Extremities:  brisk L PT Neurologic: A&O  CBC    Component Value Date/Time   WBC 8.9 09/15/2023 0459   RBC 3.15 (L) 09/15/2023 0459   HGB 9.2 (L) 09/15/2023 0459   HGB 12.3 08/28/2012 0000   HCT 28.2 (L) 09/15/2023 0459   HCT 36 08/28/2012 0000   PLT 209 09/15/2023 0459   MCV 89.5 09/15/2023 0459   MCV 92.4 08/28/2012 0000   MCH 29.2 09/15/2023 0459   MCHC 32.6 09/15/2023 0459   RDW 17.1 (H) 09/15/2023 0459   LYMPHSABS 1.9 06/08/2023 1730   MONOABS 0.8 06/08/2023 1730   EOSABS 0.3 06/08/2023 1730   BASOSABS 0.1 06/08/2023 1730    BMET    Component Value Date/Time   NA 137 09/15/2023 0459   K 4.4 09/15/2023 0459   CL 106 09/15/2023 0459   CO2 22 09/15/2023 0459   GLUCOSE 110 (H) 09/15/2023 0459   BUN 17 09/15/2023 0459   CREATININE 0.70 09/15/2023 0459   CREATININE 0.62 08/28/2012 0000   CALCIUM  8.0 (L) 09/15/2023 0459   CALCIUM  8.5 08/28/2012 0000   GFRNONAA >60 09/15/2023 0459   GFRAA >60 12/23/2018 0332    INR    Component Value Date/Time   INR 1.0 06/02/2023 1845     Intake/Output Summary (Last 24 hours) at 09/16/2023 0744 Last data filed at 09/16/2023 0600 Gross per 24 hour  Intake 99.96 ml  Output 2130 ml  Net -2030.04 ml     Assessment/Plan:  73 y.o. female is s/p primary repair ruptured bovine pericardial patch pseudoaneurysm using previous vein bypass, washout of left groin wound, left common femoral, external iliac and profundofemoral thrombectomy, sartorius muscle flap coverage left common femoral artery with wound VAC placement  3 Days Post-Op   LLE warm and well perfused based on doppler exam Plan L groin vac change later this  morning; small black foam sponge kit ordered OOB with therapy teams Home when [facility] RN and home vac arranged   Cordie Deters, PA-C [facility] [phone] 09/16/2023 7:44 AM   I agree with the above.  I have seen and evaluated the patient.  She was transferred out of the ICU yesterday.  She was able to ambulate independently yesterday.  Plan is for wound VAC change of her left groin this morning.  Preliminary cultures suggest Proteus.  She remains on Rocephin .  Will follow-up after sensitivities.  Wells Brabham  Vac changed at the bedside today [facility] for discharge when home vac and [facility] RN arranged We will switch to 500mg  augmentin BID for 2 weeks at discharge

## 2023-09-17 ENCOUNTER — Other Ambulatory Visit (HOSPITAL_COMMUNITY): Payer: Self-pay

## 2023-09-17 MED ORDER — AMOXICILLIN-POT CLAVULANATE 875-125 MG PO TABS
1.0000 | ORAL_TABLET | Freq: Two times a day (BID) | ORAL | 0 refills | Status: DC
  Filled 2023-09-17: qty 28, 14d supply, fill #0

## 2023-09-17 NOTE — Progress Notes (Signed)
 RNCM called Leana Printers at [employer] twice and left phone message each time, no response thus far.  RNCM also called [employer] weekend representative, Ada.  Per Ada, no approval yet.  She states she will watch for the approval and follow up with them.  RN staff notified wound vac still pending approval.

## 2023-09-17 NOTE — Progress Notes (Addendum)
  Progress Note    09/17/2023 7:57 AM 4 Days Post-Op  Subjective:  wants to go home.  Says her groin is a little sore.   Afebrile HR 70's-110's 120's-150's systolic   Vitals:   09/16/23 1610 09/17/23 0353  BP: 124/64 126/67  Pulse:    Resp: 16 20  Temp: 98.2 F (36.8 C) 97.9 F (36.6 C)  SpO2:      Physical Exam: General:  no distress Cardiac:  regular Lungs:  non labored Incisions:  left groin with vac with good seal Extremities:  + doppler flow left DP/PT   CBC    Component Value Date/Time   WBC 8.9 09/15/2023 0459   RBC 3.15 (L) 09/15/2023 0459   HGB 9.2 (L) 09/15/2023 0459   HGB 12.3 08/28/2012 0000   HCT 28.2 (L) 09/15/2023 0459   HCT 36 08/28/2012 0000   PLT 209 09/15/2023 0459   MCV 89.5 09/15/2023 0459   MCV 92.4 08/28/2012 0000   MCH 29.2 09/15/2023 0459   MCHC 32.6 09/15/2023 0459   RDW 17.1 (H) 09/15/2023 0459   LYMPHSABS 1.9 06/08/2023 1730   MONOABS 0.8 06/08/2023 1730   EOSABS 0.3 06/08/2023 1730   BASOSABS 0.1 06/08/2023 1730    BMET    Component Value Date/Time   NA 137 09/15/2023 0459   K 4.4 09/15/2023 0459   CL 106 09/15/2023 0459   CO2 22 09/15/2023 0459   GLUCOSE 110 (H) 09/15/2023 0459   BUN 17 09/15/2023 0459   CREATININE 0.70 09/15/2023 0459   CREATININE 0.62 08/28/2012 0000   CALCIUM  8.0 (L) 09/15/2023 0459   CALCIUM  8.5 08/28/2012 0000   GFRNONAA >60 09/15/2023 0459   GFRAA >60 12/23/2018 0332    INR    Component Value Date/Time   INR 1.0 06/02/2023 1845     Intake/Output Summary (Last 24 hours) at 09/17/2023 0757 Last data filed at 09/17/2023 0700 Gross per 24 hour  Intake --  Output 100 ml  Net -100 ml      Assessment/Plan:  73 y.o. female is s/p:  primary repair ruptured bovine pericardial patch pseudoaneurysm using previous vein bypass, washout of left groin wound, left common femoral, external iliac and profundofemoral thrombectomy, sartorius muscle flap coverage left common femoral artery with wound  VAC placement 09/13/2023 by Dr. Vikki Graves. 4 Days Post-Op   -wound vac left groin with good seal.  + doppler flow left DP/PT. -TOC note appears that [facility] is set up - RN is going to contact TOC today and verify.  If all is in place for pt to go and have wound vac changes start early next week, will discharge today.  Discussed with pt that everything needs to be in place prior to discharge.   -DVT prophylaxis:  sq heparin  -PDMP reviewed-no narcotics at discharge -fortunately, she has not smoked in quite some time.  States she hasn't craved one either.  Encouraged her to continue with this.  -she will be discharged on Augment bid x 2 weeks.    Maryanna Smart, PA-C [facility] [phone] 09/17/2023 7:57 AM   I have interviewed and examined patient with PA and agree with assessment and plan above.   Isabellarose Kope C. Vikki Graves, MD [facility] [place] Office: [phone] Pager: [phone]

## 2023-09-17 NOTE — Discharge Summary (Signed)
 Discharge Summary    Lauren King [birth year] 73 y.o. female  [id number]  Admission Date: 09/12/2023  Discharge Date: 09/17/2023  Physician: Lauren Shell, MD  Admission Diagnosis: Pulsatile groin mass [R19.09] Status post vascular surgery [Z98.890]   HPI:   This is a 73 y.o. female ***  Hospital Course:  The patient was admitted to the hospital and taken to the operating room on 09/13/2023 and underwent: ***    The pt tolerated the procedure well and was transported to the PACU in {DESC; GOOD/FAIR/POOR:18685} condition.   ***   CBC    Component Value Date/Time   WBC 8.9 09/15/2023 0459   RBC 3.15 (L) 09/15/2023 0459   HGB 9.2 (L) 09/15/2023 0459   HGB 12.3 08/28/2012 0000   HCT 28.2 (L) 09/15/2023 0459   HCT 36 08/28/2012 0000   PLT 209 09/15/2023 0459   MCV 89.5 09/15/2023 0459   MCV 92.4 08/28/2012 0000   MCH 29.2 09/15/2023 0459   MCHC 32.6 09/15/2023 0459   RDW 17.1 (H) 09/15/2023 0459   LYMPHSABS 1.9 06/08/2023 1730   MONOABS 0.8 06/08/2023 1730   EOSABS 0.3 06/08/2023 1730   BASOSABS 0.1 06/08/2023 1730    BMET    Component Value Date/Time   NA 137 09/15/2023 0459   K 4.4 09/15/2023 0459   CL 106 09/15/2023 0459   CO2 22 09/15/2023 0459   GLUCOSE 110 (H) 09/15/2023 0459   BUN 17 09/15/2023 0459   CREATININE 0.70 09/15/2023 0459   CREATININE 0.62 08/28/2012 0000   CALCIUM  8.0 (L) 09/15/2023 0459   CALCIUM  8.5 08/28/2012 0000   GFRNONAA >60 09/15/2023 0459   GFRAA >60 12/23/2018 0332      Discharge Instructions     Call MD for:  redness, tenderness, or signs of infection (pain, swelling, bleeding, redness, odor or green/yellow discharge around incision site)   Complete by: As directed    Call MD for:  severe or increased pain, loss or decreased feeling  in affected limb(s)   Complete by: As directed    Call MD for:  temperature >100.5   Complete by: As directed    Discharge wound care:   Complete by: As directed    Wound vac  change twice a week with home health nurse.   Lifting restrictions   Complete by: As directed    No heavy lifting until groin wound has healed.   Resume previous diet   Complete by: As directed        Discharge Diagnosis:  Pulsatile groin mass [R19.09] Status post vascular surgery [Z98.890]  Secondary Diagnosis: Patient Active Problem List   Diagnosis Date Noted   Status post vascular surgery 09/13/2023   Non-healing surgical wound 06/29/2023   TIA (transient ischemic attack) 06/02/2023   HTN (hypertension) 06/02/2023   HLD (hyperlipidemia) 06/02/2023   OSA (obstructive sleep apnea) 06/02/2023   Critical limb ischemia of right lower extremity (HCC) 05/27/2023   Aortoiliac occlusive disease (HCC) 05/27/2023   COPD (chronic obstructive pulmonary disease) (HCC) 01/05/2022   Delusions (HCC) 01/03/2022   Hyponatremia 01/03/2022   Urinary tract infection in elderly patient 01/03/2022   PAD (peripheral artery disease) (HCC) 12/22/2018   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/11/2018   Major depressive disorder, single episode 03/22/2017   Diverticulosis of colon without hemorrhage    Reflux esophagitis    Schatzki's ring    Gastric ulceration    Hiatal hernia    Encounter for screening  colonoscopy 02/06/2015   Constipation 01/02/2015   Esophageal dysphagia 09/21/2012   GERD (gastroesophageal reflux disease) 09/21/2012   Loss of weight 09/21/2012   Past Medical History:  Diagnosis Date   Anxiety    Chronic lower back pain    COPD (chronic obstructive pulmonary disease) (HCC)    Depression    GERD (gastroesophageal reflux disease)    Hypertension    Obstructive sleep apnea    CPAP   Peripheral vascular disease (HCC)      Allergies as of 09/17/2023   No Known Allergies      Medication List     TAKE these medications    albuterol  108 (90 Base) MCG/ACT inhaler Commonly known as: VENTOLIN  HFA Inhale 1-2 puffs into the lungs every  6 (six) hours as needed for wheezing or shortness of breath.   amoxicillin-clavulanate 875-125 MG tablet Commonly known as: AUGMENTIN Take 1 tablet by mouth 2 (two) times daily.   aspirin  EC 81 MG tablet Take 81 mg by mouth daily.   clopidogrel  75 MG tablet Commonly known as: PLAVIX  Take 1 tablet (75 mg total) by mouth daily.   gabapentin  100 MG capsule Commonly known as: Neurontin  Take 1 capsule (100 mg total) by mouth 2 (two) times daily.   losartan  50 MG tablet Commonly known as: COZAAR  Take 50 mg by mouth daily.   mirtazapine  15 MG tablet Commonly known as: REMERON  Take 15 mg by mouth at bedtime.   multivitamin with minerals tablet Take 1 tablet by mouth daily.   Oxycodone  HCl 10 MG Tabs Take 10 mg by mouth every 4 (four) hours as needed for severe pain.   rosuvastatin  20 MG tablet Commonly known as: CRESTOR  Take 20 mg by mouth at bedtime.   Trelegy Ellipta 100-62.5-25 MCG/INH Aepb Generic drug: Fluticasone -Umeclidin-Vilant Inhale 1 puff into the lungs daily.               Durable Medical Equipment  (From admission, onward)           Start     Ordered   09/16/23 1130  For home use only DME Negative pressure wound device  Once       Question Answer Comment  Frequency of dressing change 2 times per week   Length of need 3 Months   Dressing type Foam   Amount of suction 125 mm/Hg   Pressure application Continuous pressure   Supplies 10 canisters and 15 dressings per month for duration of therapy      09/16/23 1129              Discharge Care Instructions  (From admission, onward)           Start     Ordered   09/17/23 0000  Discharge wound care:       Comments: Wound vac change twice a week with home health nurse.   09/17/23 0840            Prescriptions given: Augmentin 800/175 mg bid #28 no refill PDMP reviewed-no narcotics at discharge.   Instructions: 1. Wound vac change twice weekly 2.  No heavy lifting until wound  has healed  Disposition: home with [facility]  Patient's condition: is Good  Follow up: 1. VVS in 2 weeks for wound check.  Please have pt bring supplies to the office.  I discussed this with pt as well.  She expressed good understanding.      Lauren Smart, PA-C [facility] [phone] 09/17/2023  8:40 AM

## 2023-09-17 NOTE — Progress Notes (Signed)
 New type of wound vac was delivered to patient's room. No instructions as to how to switch it over.   Attempted to call case manager in the ED but was unable to get them to answer.  Paged vascular PA Sam to explain the situation. Wound vac dressing was to [employer] to the new type of wound vac.  Seal good @ 125.   Patient tolerated well.

## 2023-09-17 NOTE — Progress Notes (Signed)
 RNCM received phone call from Lauren King with [employer] stating wound vac has been approved by their Regional Manager and will be delivered to the patient's room shortly.

## 2023-09-17 NOTE — Progress Notes (Addendum)
 AVS dc instructions reviewed with patient. Awaiting wound vac to be delivered in room. Toc med  given to patient.Primary RN will dc piv  and provide  home wound vac education.

## 2023-09-17 NOTE — Plan of Care (Signed)

## 2023-09-18 LAB — AEROBIC/ANAEROBIC CULTURE W GRAM STAIN (SURGICAL/DEEP WOUND)

## 2023-09-19 ENCOUNTER — Ambulatory Visit: Admitting: Surgery

## 2023-09-21 ENCOUNTER — Telehealth: Payer: Self-pay

## 2023-09-21 NOTE — Telephone Encounter (Signed)
 Home Health:  -spoke to Edgemere, RN @ [facility] who reported pt called for worsening swelling distal to wound vac  / incision.  She reports it was pt positioning causing said area to appear bigger.  Wound vac changed again w/o significant drainage noted during previous vac change. -VERBAL ORDERS given:  HH RN with wound vac changes 2 times per week until discontinued or healed.  1 additional wound vac change on 05/13 for wound assessment purposes.

## 2023-09-21 NOTE — Telephone Encounter (Signed)
 Wound Vac / Home Health: -received call from Melvin, RN @ [facility] reporting excessive serosanguinous drainage after removing wound vac dressing prior to re-applying.  Also that patient reported a pouch or raised area distal to the incision below the staples.   -consulted with PA who states this is not uncommon and will assess appropriately during next office visit in approx 2 weeks

## 2023-10-04 ENCOUNTER — Telehealth: Payer: Self-pay

## 2023-10-04 NOTE — Telephone Encounter (Signed)
 Pt's HH nurse Abraham Hoffmann with [facility] called to let us  know pt's wound vac was turned off again today and in the other room. This was the case when [name] saw her last Friday, as well. Abraham Hoffmann feels the wound is improving and no undermining noted today. The pt has been advised to call [name] and let them know when wound vac is not in use or working and pt does not let them know. Pt's daughter is no longer allowed in the pt's apt complex and if pt needs wet-dry dressing changes, she would have to do them on herself. Pt is scheduled for wound check in office tomorrow.

## 2023-10-05 ENCOUNTER — Ambulatory Visit: Attending: Vascular Surgery | Admitting: Physician Assistant

## 2023-10-05 VITALS — BP 124/75 | HR 86 | Temp 98.8°F | Wt <= 1120 oz

## 2023-10-05 DIAGNOSIS — I70213 Atherosclerosis of native arteries of extremities with intermittent claudication, bilateral legs: Secondary | ICD-10-CM

## 2023-10-05 DIAGNOSIS — I899 Noninfective disorder of lymphatic vessels and lymph nodes, unspecified: Secondary | ICD-10-CM

## 2023-10-05 DIAGNOSIS — I70221 Atherosclerosis of native arteries of extremities with rest pain, right leg: Secondary | ICD-10-CM

## 2023-10-05 NOTE — Progress Notes (Signed)
 POST OPERATIVE OFFICE NOTE    CC:  F/u for surgery  HPI:  This is a 73 y.o. female who is s/p endovascular aortobiiliac stent using Endologix, right femoral endarterectomy with bovine patch angioplasty, left femoral endarterectomy with bovine patch angioplasty, right femoral to below the knee popliteal artery bypass with vein by Dr. Charlotte Cookey due to rest pain on 05/27/2023.  She had a postoperative left groin seroma requiring incision and drainage by Dr. Edgardo Goodwill on 06/29/2023.  She required return trip to the operating room on 09/13/2023 due to left groin hematoma and found to have a completely disrupted patch.  Dr. Reola Casino performed repair of ruptured bovine patch with previous bypass saphenous vein, left leg thrombectomy and sartorius muscle flap with wound VAC placement.  She is receiving wound VAC changes by home health 2 times per week.  She denies any rest pain in both legs.  She is on aspirin  and Plavix  and statin daily.  She continues to smoke.  No Known Allergies  Current Outpatient Medications  Medication Sig Dispense Refill   albuterol  (VENTOLIN  HFA) 108 (90 Base) MCG/ACT inhaler Inhale 1-2 puffs into the lungs every 6 (six) hours as needed for wheezing or shortness of breath.     amoxicillin -clavulanate (AUGMENTIN ) 875-125 MG tablet Take 1 tablet by mouth 2 (two) times daily. 28 tablet 0   aspirin  EC 81 MG tablet Take 81 mg by mouth daily.     clopidogrel  (PLAVIX ) 75 MG tablet Take 1 tablet (75 mg total) by mouth daily. 30 tablet 6   gabapentin  (NEURONTIN ) 100 MG capsule Take 1 capsule (100 mg total) by mouth 2 (two) times daily. (Patient not taking: Reported on 09/13/2023) 30 capsule 0   losartan  (COZAAR ) 50 MG tablet Take 50 mg by mouth daily.     mirtazapine  (REMERON ) 15 MG tablet Take 15 mg by mouth at bedtime.     Multiple Vitamins-Minerals (MULTIVITAMIN WITH MINERALS) tablet Take 1 tablet by mouth daily.     Oxycodone  HCl 10 MG TABS Take 10 mg by mouth every 4 (four) hours as needed for  severe pain.     rosuvastatin  (CRESTOR ) 20 MG tablet Take 20 mg by mouth at bedtime.     TRELEGY ELLIPTA 100-62.5-25 MCG/INH AEPB Inhale 1 puff into the lungs daily.     No current facility-administered medications for this visit.     ROS:  See HPI  Physical Exam:  Vitals:   10/05/23 0947  BP: 124/75  Pulse: 86  Temp: 98.8 F (37.1 C)  TempSrc: Temporal  SpO2: 93%  Weight: 67 lb (30.4 kg)    Incision: Right leg incisions well-healed; left groin with healthy appearing wound bed wound VAC was removed; distal pole of incision separate from wound bed with seroma Extremities: Brisk right DP and left PT signals by Doppler Neuro: A&O  Assessment/Plan:  This is a 73 y.o. female who returns for evaluation of left groin wound after repair of disrupted bovine patch causing pseudoaneurysm requiring muscle flap and VAC placement  Bilateral lower extremities well-perfused by Doppler exam Wound VAC changed in the office today.  She has a seroma at the distal pole of the groin incision separate from the wound bed.  This was probed and evacuated using a Q-tip.  VAC was changed incorporating this area with a sponge.  She will return in 2 to 3 weeks for another wound check.  She will require continued wound VAC therapy with VAC changes twice per week by home health.  Continue  aspirin , Plavix , statin daily.  Encouraged smoking cessation especially while healing and open groin wound.  She will also require aortoiliac duplex, right leg bypass duplex and ABI in the near future however this was not ordered during today's visit due L groin wound.   Cordie Deters, PA-C [facility] [phone]  Clinic MD:  Vikki Graves

## 2023-10-20 ENCOUNTER — Ambulatory Visit: Attending: Surgery | Admitting: Physician Assistant

## 2023-10-20 VITALS — BP 126/75 | HR 88 | Temp 98.7°F | Ht 62.0 in | Wt 73.0 lb

## 2023-10-20 DIAGNOSIS — I70221 Atherosclerosis of native arteries of extremities with rest pain, right leg: Secondary | ICD-10-CM

## 2023-10-20 DIAGNOSIS — I899 Noninfective disorder of lymphatic vessels and lymph nodes, unspecified: Secondary | ICD-10-CM

## 2023-10-28 NOTE — Progress Notes (Signed)
 POST OPERATIVE OFFICE NOTE    CC:  F/u for surgery  HPI: Lauren King is a 73 y.o. female who is here for repeat wound check.  She has a history of endovascular aortobiiliac stenting using endologix, right femoral endarterectomy with bovine patch angioplasty, left femoral endarterectomy with bovine patch angioplasty, and right femoral to below the knee popliteal artery bypass with vein by Dr. Charlotte Cookey due to rest pain on 05/27/2023. She had a postoperative left groin seroma requiring incision and drainage by Dr. Edgardo Goodwill on 06/29/2023. She required return trip to the operating room on 09/13/2023 due to left groin hematoma and was found to have a completely disrupted patch. Dr. Vikki Graves performed repair of the ruptured bovine patch with previous bypass saphenous vein, left leg thrombectomy, and sartorius muscle flap with wound VAC placement.  Postoperatively she has been receiving home health wound VAC changes 2 times weekly.  She returns today for follow-up.  She says that she is doing well.  She feels like her left groin wound has gotten significantly smaller since her last visit.  She denies any fevers, chills, or puslike drainage.  She has been receiving wound VAC changes 2 times weekly by home health.  She denies any lower extremity pain.   No Known Allergies  Current Outpatient Medications  Medication Sig Dispense Refill   albuterol  (VENTOLIN  HFA) 108 (90 Base) MCG/ACT inhaler Inhale 1-2 puffs into the lungs every 6 (six) hours as needed for wheezing or shortness of breath.     amoxicillin -clavulanate (AUGMENTIN ) 875-125 MG tablet Take 1 tablet by mouth 2 (two) times daily. 28 tablet 0   aspirin  EC 81 MG tablet Take 81 mg by mouth daily.     clopidogrel  (PLAVIX ) 75 MG tablet Take 1 tablet (75 mg total) by mouth daily. 30 tablet 6   gabapentin  (NEURONTIN ) 100 MG capsule Take 1 capsule (100 mg total) by mouth 2 (two) times daily. (Patient not taking: Reported on 09/13/2023) 30 capsule 0   losartan   (COZAAR ) 50 MG tablet Take 50 mg by mouth daily.     mirtazapine  (REMERON ) 15 MG tablet Take 15 mg by mouth at bedtime.     Multiple Vitamins-Minerals (MULTIVITAMIN WITH MINERALS) tablet Take 1 tablet by mouth daily.     Oxycodone  HCl 10 MG TABS Take 10 mg by mouth every 4 (four) hours as needed for severe pain.     rosuvastatin  (CRESTOR ) 20 MG tablet Take 20 mg by mouth at bedtime.     TRELEGY ELLIPTA 100-62.5-25 MCG/INH AEPB Inhale 1 puff into the lungs daily.     No current facility-administered medications for this visit.     ROS:  See HPI  Physical Exam:  Incision: Left groin wound appears significantly smaller with a healthy wound bed.  There are no signs of infection Extremities: BLE warm and well-perfused Neuro: Intact motor and sensation of BLE      Assessment/Plan:  This is a 73 y.o. female who is here for a wound check  - The patient presents today for repeat wound check after recent left groin washout, bovine patch repair, and sartorius muscle flap - At today's visit, her left groin wound has made significant progress in healing.  Her wound bed is much smaller than her previous visit.  There is healthy granulation tissue present without signs of infection - Bilateral lower extremities are warm well-perfused.  She has brisk Doppler signals in both feet -Given that the patient's wound is much smaller now, she does not need  her wound VAC anymore.  We will convert the patient to wet-to-dry dressings daily.  The patient's daughter is able to perform these dressing changes on the days that home health is not present - She can follow-up with our office in another 2 to 3 weeks for repeat wound check.  Once her left groin is completely healed she will require aortoiliac duplex, right lower extremity bypass graft duplex, and ABIs   [name], PA-C [facility] [phone]   Clinic MD:  Rosalva Comber

## 2023-11-14 ENCOUNTER — Ambulatory Visit

## 2023-12-12 ENCOUNTER — Ambulatory Visit (HOSPITAL_COMMUNITY)
Admission: RE | Admit: 2023-12-12 | Discharge: 2023-12-12 | Disposition: A | Discharge status: Home / Self Care | Source: Ambulatory Visit | Attending: Family Medicine | Admitting: Family Medicine

## 2023-12-12 ENCOUNTER — Other Ambulatory Visit (HOSPITAL_COMMUNITY): Payer: Self-pay | Admitting: Family Medicine

## 2023-12-12 DIAGNOSIS — M47816 Spondylosis without myelopathy or radiculopathy, lumbar region: Secondary | ICD-10-CM | POA: Diagnosis not present

## 2023-12-12 DIAGNOSIS — G8929 Other chronic pain: Secondary | ICD-10-CM | POA: Insufficient documentation

## 2023-12-12 DIAGNOSIS — R0602 Shortness of breath: Secondary | ICD-10-CM

## 2023-12-12 DIAGNOSIS — J439 Emphysema, unspecified: Secondary | ICD-10-CM | POA: Diagnosis not present

## 2023-12-12 DIAGNOSIS — M5186 Other intervertebral disc disorders, lumbar region: Secondary | ICD-10-CM | POA: Diagnosis not present

## 2023-12-12 DIAGNOSIS — M5459 Other low back pain: Secondary | ICD-10-CM | POA: Diagnosis not present

## 2023-12-12 DIAGNOSIS — M48061 Spinal stenosis, lumbar region without neurogenic claudication: Secondary | ICD-10-CM | POA: Diagnosis not present

## 2023-12-12 DIAGNOSIS — M545 Low back pain, unspecified: Secondary | ICD-10-CM | POA: Diagnosis not present

## 2023-12-12 DIAGNOSIS — M438X5 Other specified deforming dorsopathies, thoracolumbar region: Secondary | ICD-10-CM | POA: Diagnosis not present

## 2023-12-19 ENCOUNTER — Encounter: Payer: Self-pay | Admitting: Physician Assistant

## 2023-12-19 ENCOUNTER — Ambulatory Visit: Attending: Surgery | Admitting: Physician Assistant

## 2023-12-19 VITALS — BP 146/85 | HR 68 | Temp 97.7°F | Wt 71.4 lb

## 2023-12-19 DIAGNOSIS — I739 Peripheral vascular disease, unspecified: Secondary | ICD-10-CM | POA: Diagnosis not present

## 2023-12-19 NOTE — Progress Notes (Signed)
 HISTORY AND PHYSICAL     CC:  follow up. Requesting Provider:  Buddie Bars, MD  HPI: This is a 73 y.o. female who is here today for follow up for PAD.  Pt has hx of endovascular aortobiiliac stenting using endologix, right femoral endarterectomy with bovine patch angioplasty, left femoral endarterectomy with bovine patch angioplasty, and right femoral to below the knee popliteal artery bypass with vein by Dr. Serene due to rest pain on 05/27/2023. She had a postoperative left groin seroma requiring incision and drainage by Dr. Magda on 06/29/2023. She required return trip to the operating room on 09/13/2023 due to left groin hematoma and was found to have a completely disrupted patch. Dr. Sheree performed repair of the ruptured bovine patch with previous bypass saphenous vein, left leg thrombectomy, and sartorius muscle flap with wound VAC placement on 09/13/2023.    Pt was last seen 10/20/2023 and at that time, her groin wound was nearly healed.  She had brisk doppler flow in both feet.  The vac was discontinued.   She will be scheduled for aortoiliac duplex, RLE duplex and ABI once groin is healed.   The pt returns today for follow up.  She states that her groin has healed.  She has numbness in the left thigh and the right foot up to her knee.  She denies any ulcers on her feet.  She does not get claudication as she is more limited by her shortness of breath.  She states that she is now in a non smoking apartment and may smoke 2 cigarettes per day if that.   The pt is on a statin for cholesterol management.    The pt is on an aspirin .    Other AC:  Plavix  The pt is on ARB for hypertension.  The pt is not on diabetic medication. Tobacco hx:  current as above   Past Medical History:  Diagnosis Date   Anxiety    Chronic lower back pain    COPD (chronic obstructive pulmonary disease) (HCC)    Depression    GERD (gastroesophageal reflux disease)    Hypertension    Obstructive sleep apnea     CPAP   Peripheral vascular disease (HCC)     Past Surgical History:  Procedure Laterality Date   ABDOMINAL AORTIC ENDOVASCULAR STENT GRAFT N/A 05/27/2023   Procedure: AORTIC STENTING USING ENDOLOGIX GRAFT;  Surgeon: Serene Gaile ORN, MD;  Location: [facility] OR;  Service: Vascular;  Laterality: N/A;   ABDOMINAL AORTOGRAM W/LOWER EXTREMITY N/A 09/11/2019   Procedure: ABDOMINAL AORTOGRAM W/LOWER EXTREMITY;  Surgeon: Serene Gaile ORN, MD;  Location: [facility];  Service: Cardiovascular;  Laterality: N/A;   ABDOMINAL AORTOGRAM W/LOWER EXTREMITY N/A 10/23/2019   Procedure: ABDOMINAL AORTOGRAM W/LOWER EXTREMITY;  Surgeon: Serene Gaile ORN, MD;  Location: [facility];  Service: Cardiovascular;  Laterality: N/A;   ABDOMINAL AORTOGRAM W/LOWER EXTREMITY N/A 02/02/2022   Procedure: ABDOMINAL AORTOGRAM W/LOWER EXTREMITY;  Surgeon: Serene Gaile ORN, MD;  Location: [facility];  Service: Cardiovascular;  Laterality: N/A;   ABDOMINAL AORTOGRAM W/LOWER EXTREMITY N/A 03/23/2022   Procedure: ABDOMINAL AORTOGRAM W/LOWER EXTREMITY;  Surgeon: Serene Gaile ORN, MD;  Location: [facility];  Service: Cardiovascular;  Laterality: N/A;   ABDOMINAL AORTOGRAM W/LOWER EXTREMITY N/A 03/29/2023   Procedure: ABDOMINAL AORTOGRAM W/LOWER EXTREMITY;  Surgeon: Serene Gaile ORN, MD;  Location: [facility];  Service: Cardiovascular;  Laterality: N/A;   ABDOMINAL HYSTERECTOMY     APPLICATION OF WOUND  VAC Left 09/13/2023   Procedure: APPLICATION, WOUND VAC;  Surgeon: Sheree Penne Bruckner, MD;  Location: [facility] OR;  Service: Vascular;  Laterality: Left;   BYPASS GRAFT FEMORAL-PERONEAL Right 05/27/2023   Procedure: BYPASS GRAFT RIGHT FEMORAL TO BELOW THE KNEE POPITEAL ARTERY USING NONREVERSED RIGHT GREATER SAPHENOUS VEIN;  Surgeon: Serene Gaile ORN, MD;  Location: [facility] OR;  Service: Vascular;  Laterality: Right;   CATARACT EXTRACTION     COLONOSCOPY  01/20/2005   MFM:[id number] colon polyp/Diminutive polyps  at 30 cm ablated/Normal rectum. hyperplastic polyp   COLONOSCOPY N/A 03/25/2015   MFM:[id number] /left-sided diverticula   ENDARTERECTOMY FEMORAL Left 12/22/2018   Procedure: Endarterectomy Left Femoral and Politeal Arteries;  Surgeon: Serene Gaile ORN, MD;  Location: [facility] OR;  Service: Vascular;  Laterality: Left;   ENDARTERECTOMY FEMORAL Bilateral 05/27/2023   Procedure: RIGHT FEMORAL ENDARTERECTOMY WITH XENSURE BOVINE PERICARDIUM PATCH AND REDO LEFT FEMORAL ENDARTERECTOMY WITH XENSURE BOVINE PERICARDIUM PATCH;  Surgeon: Serene Gaile ORN, MD;  Location: [facility] OR;  Service: Vascular;  Laterality: Bilateral;   ESOPHAGOGASTRODUODENOSCOPY  11/30/2004   RMR:A small patch of salmon-colored epithelium (salmon-like lesion in the distal esophagus)/Antral pre-pyloric erosions of uncertain significance, otherwise, normal. solitary duodenal bulbar AVM.No Barrett's.   ESOPHAGOGASTRODUODENOSCOPY N/A 03/25/2015   MFM:[id number] reflux/schatzkis ring s/p dilation/HH/short segment barrett   ESOPHAGOGASTRODUODENOSCOPY (EGD) WITH ESOPHAGEAL DILATION N/A 09/22/2012   Rourk: erosive RE, biopsy negative for Barrett's. Gastritis.    FEMORAL ARTERY EXPLORATION Left 09/13/2023   Procedure: EXPLORATION, ARTERY, FEMORAL;  Surgeon: Sheree Penne Bruckner, MD;  Location: [facility] OR;  Service: Vascular;  Laterality: Left;  WASHOUT OF LEFT GROIN WOUND   FEMORAL-TIBIAL BYPASS GRAFT Left 12/22/2018   Procedure: BYPASS GRAFT FEMORAL-TIBIAL ARTERY LEFT LEG USING NONREVERSED LEFT GREAT SAPHENOUS VEIN;  Surgeon: Serene Gaile ORN, MD;  Location: [facility];  Service: Vascular;  Laterality: Left;   INCISION AND DRAINAGE OF WOUND Left 06/29/2023   Procedure: LEFT GROIN IRRIGATION AND DEBRIDEMENT WOUND;  Surgeon: Magda Debby SAILOR, MD;  Location: [facility] OR;  Service: Vascular;  Laterality: Left;   LOWER EXTREMITY ANGIOGRAPHY N/A 12/27/2017   Procedure: LOWER EXTREMITY ANGIOGRAPHY;  Surgeon: Serene Gaile ORN, MD;  [facility]: [facility];  Service:  Cardiovascular;  Laterality: N/A;   LOWER EXTREMITY ANGIOGRAPHY N/A 11/21/2018   Procedure: LOWER EXTREMITY ANGIOGRAPHY;  Surgeon: Serene Gaile ORN, MD;  [facility];  Service: Cardiovascular;  Laterality: N/A;   MALONEY DILATION N/A 03/25/2015   Procedure: AGAPITO DILATION;  Surgeon: Lamar CHRISTELLA Hollingshead, MD;  [facility];  Service: Endoscopy;  Laterality: N/A;   MUSCLE FLAP CLOSURE Left 09/13/2023   Procedure: Sartorios MUSCLE FLAP;  Surgeon: Sheree Penne Bruckner, MD;  Location: [facility] OR;  Service: Vascular;  Laterality: Left;   PERIPHERAL VASCULAR BALLOON ANGIOPLASTY  02/02/2022   Procedure: PERIPHERAL VASCULAR BALLOON ANGIOPLASTY;  Surgeon: Serene Gaile ORN, MD;  Location: [facility];  Service: Cardiovascular;;   PERIPHERAL VASCULAR INTERVENTION Left 09/11/2019   Procedure: PERIPHERAL VASCULAR INTERVENTION;  Surgeon: Serene Gaile ORN, MD;  [facility]: [facility];  Service: Cardiovascular;  Laterality: Left;   PERIPHERAL VASCULAR INTERVENTION Right 10/23/2019   Procedure: PERIPHERAL VASCULAR INTERVENTION;  Surgeon: Serene Gaile ORN, MD;  [facility];  Service: Cardiovascular;  Laterality: Right;  external iliac   PERIPHERAL VASCULAR INTERVENTION  03/23/2022   Procedure: PERIPHERAL VASCULAR INTERVENTION;  Surgeon: Serene Gaile ORN, MD;  [facility]: [facility];  Service: Cardiovascular;;   PERIPHERAL VASCULAR THROMBECTOMY  03/23/2022  Procedure: PERIPHERAL VASCULAR THROMBECTOMY;  Surgeon: Serene Gaile ORN, MD;  Location: [facility];  Service: Cardiovascular;;   SKIN CANCER DESTRUCTION     THROMBECTOMY FEMORAL ARTERY Right 05/27/2023   Procedure: THROMBECTOMY OF ILIOFEMORAL ARTERY;  Surgeon: Serene Gaile ORN, MD;  Location: [facility] OR;  Service: Vascular;  Laterality: Right;   THROMBECTOMY FEMORAL ARTERY Left 09/13/2023   Procedure: THROMBECTOMY, ARTERY, FEMORAL, Left femoral artery patch angioplasty;  Surgeon: Sheree Penne Bruckner, MD;  Location: [facility] OR;  Service: Vascular;  Laterality: Left;   TUBAL LIGATION     with incidental appendectomy   VEIN HARVEST Left 12/22/2018   Procedure: Vein Harvest Left Great Saphenous;  Surgeon: Serene Gaile ORN, MD;  Location: [facility] OR;  Service: Vascular;  Laterality: Left;    No Known Allergies  Current Outpatient Medications  Medication Sig Dispense Refill   albuterol  (VENTOLIN  HFA) 108 (90 Base) MCG/ACT inhaler Inhale 1-2 puffs into the lungs every 6 (six) hours as needed for wheezing or shortness of breath.     amoxicillin -clavulanate (AUGMENTIN ) 875-125 MG tablet Take 1 tablet by mouth 2 (two) times daily. 28 tablet 0   aspirin  EC 81 MG tablet Take 81 mg by mouth daily.     clopidogrel  (PLAVIX ) 75 MG tablet Take 1 tablet (75 mg total) by mouth daily. 30 tablet 6   gabapentin  (NEURONTIN ) 100 MG capsule Take 1 capsule (100 mg total) by mouth 2 (two) times daily. (Patient not taking: Reported on 09/13/2023) 30 capsule 0   losartan  (COZAAR ) 50 MG tablet Take 50 mg by mouth daily.     mirtazapine  (REMERON ) 15 MG tablet Take 15 mg by mouth at bedtime.     Multiple Vitamins-Minerals (MULTIVITAMIN WITH MINERALS) tablet Take 1 tablet by mouth daily.     Oxycodone  HCl 10 MG TABS Take 10 mg by mouth every 4 (four) hours as needed for severe pain.     rosuvastatin  (CRESTOR ) 20 MG tablet Take 20 mg by mouth at bedtime.     TRELEGY ELLIPTA 100-62.5-25 MCG/INH AEPB Inhale 1 puff into the lungs daily.     No current facility-administered medications for this visit.    Family History  Problem Relation Age of Onset   Lung cancer Sister    Lung cancer Brother    Colon cancer Neg Hx     Social History   Socioeconomic History   Marital status: Divorced    Spouse name: Not on file   Number of children: 2   Years of education: Not on file   Highest education level: Not on file  Occupational History   Not on file  Tobacco Use   Smoking status: Some Days    Average packs/day:  0.4 packs/day for 40.0 years (16.0 ttl pk-yrs)    Types: Cigarettes    Start date: 05/11/2022    Last attempt to quit: 1972    Years since quitting: 53.6   Smokeless tobacco: Never  Vaping Use   Vaping status: Former   Substances: Nicotine   Substance and Sexual Activity   Alcohol  use: Yes    Comment: maybe 1 drink every couple of months   Drug use: Yes    Types: Marijuana    Comment: smokes maybe 2 times a month (says MD is aware)   Sexual activity: Not on file  Other Topics Concern   Not on file  Social History Narrative   Pt has had 5 children pass away. 2 living children   Social Drivers of Health  Financial Resource Strain: Not on file  Food Insecurity: No Food Insecurity (09/13/2023)   Hunger Vital Sign    Worried About Running Out of Food in the Last Year: Never true    Ran Out of Food in the Last Year: Never true  Transportation Needs: No Transportation Needs (09/13/2023)   PRAPARE - Administrator, Civil Service (Medical): No    Lack of Transportation (Non-Medical): No  Physical Activity: Not on file  Stress: Not on file  Social Connections: Patient Declined (09/13/2023)   Social Connection and Isolation Panel    Frequency of Communication with Friends and Family: Patient declined    Frequency of Social Gatherings with Friends and Family: Patient declined    Attends Religious Services: Patient declined    Database administrator or Organizations: Patient declined    Attends Banker Meetings: Patient declined    Marital Status: Patient declined  Intimate Partner Violence: Not At Risk (09/13/2023)   Humiliation, Afraid, Rape, and Kick questionnaire    Fear of Current or Ex-Partner: No    Emotionally Abused: No    Physically Abused: No    Sexually Abused: No     REVIEW OF SYSTEMS:   [X]  denotes positive finding, [ ]  denotes negative finding Cardiac  Comments:  Chest pain or chest pressure:    Shortness of breath upon exertion:    Short of  breath when lying flat:    Irregular heart rhythm:        Vascular    Pain in calf, thigh, or hip brought on by ambulation:    Pain in feet at night that wakes you up from your sleep:     Blood clot in your veins:    Leg swelling:         Pulmonary    Oxygen  at home:    Productive cough:     Wheezing:         Neurologic    Sudden weakness in arms or legs:     Sudden numbness in arms or legs:     Sudden onset of difficulty speaking or slurred speech:    Temporary loss of vision in one eye:     Problems with dizziness:         Gastrointestinal    Blood in stool:     Vomited blood:         Genitourinary    Burning when urinating:     Blood in urine:        Psychiatric    Major depression:         Hematologic    Bleeding problems:    Problems with blood clotting too easily:        Skin    Rashes or ulcers:        Constitutional    Fever or chills:      PHYSICAL EXAMINATION:  Today's Vitals   12/19/23 1218  BP: (!) 146/85  Pulse: 68  Temp: 97.7 F (36.5 C)  TempSrc: Temporal  Weight: 71 lb 6.4 oz (32.4 kg)   Body mass index is 13.06 kg/m.   General:  WDWN in NAD; vital signs documented above Gait: Not observed HENT: WNL, normocephalic Pulmonary: normal non-labored breathing , without wheezing Cardiac: regular HR, without carotid bruits Skin: without rashes Vascular Exam/Pulses:  Right Left  Radial 2+ (normal) trace  Femoral 3+ (hyperdynamic) 3+ (hyperdynamic)  AT monophasic monophasic  PT Brisk monophasic monophasic  Peroneal monophasic Brisk  monophasic   Extremities: without ischemic changes, without Gangrene , without cellulitis; without open wounds Musculoskeletal: no muscle wasting or atrophy  Neurologic: A&O X 3 Psychiatric:  The pt has Normal affect.   Non-Invasive Vascular Imaging:   None today    ASSESSMENT/PLAN:: 73 y.o. female here for follow up for PAD with hx of endovascular aortobiiliac stenting using endologix, right femoral  endarterectomy with bovine patch angioplasty, left femoral endarterectomy with bovine patch angioplasty, and right femoral to below the knee popliteal artery bypass with vein by Dr. Serene due to rest pain on 05/27/2023. She had a postoperative left groin seroma requiring incision and drainage by Dr. Magda on 06/29/2023. She required return trip to the operating room on 09/13/2023 due to left groin hematoma and was found to have a completely disrupted patch. Dr. Sheree performed repair of the ruptured bovine patch with previous bypass saphenous vein, left leg thrombectomy, and sartorius muscle flap with wound VAC placement on 09/13/2023.     -left groin wound has completely healed.   -she does have doppler flow bilateral feet.  She has numbness in the left thigh, most likely from nerve irritation from surgery.  She does have some numbness in the right lower leg from the knee to her foot, which could be due to lack of blood flow prior to bypass or possibly nerve irritation from below knee incision.   -continue asa/statin/plavix  -discussed importance of increased walking daily but this is limited by her shortness of breath. -discussed importance of smoking cessation and it puts her at risk of limb loss.  She has cut back significantly.  -pt will f/u in 4-6 weeks with aortoiliac duplex, RLE bypass duplex and ABI and will see Dr. Serene. -of note, she did have carotid duplex October 2024 that revealed bilateral 1-39% ICA stenosis.    Lucie Apt, [facility] [phone]  Clinic MD:   Serene

## 2023-12-20 ENCOUNTER — Other Ambulatory Visit: Payer: Self-pay

## 2023-12-20 DIAGNOSIS — I70213 Atherosclerosis of native arteries of extremities with intermittent claudication, bilateral legs: Secondary | ICD-10-CM

## 2023-12-30 DIAGNOSIS — G8929 Other chronic pain: Secondary | ICD-10-CM | POA: Diagnosis not present

## 2023-12-30 DIAGNOSIS — Z79899 Other long term (current) drug therapy: Secondary | ICD-10-CM | POA: Diagnosis not present

## 2023-12-30 DIAGNOSIS — M545 Low back pain, unspecified: Secondary | ICD-10-CM | POA: Diagnosis not present

## 2023-12-30 DIAGNOSIS — J449 Chronic obstructive pulmonary disease, unspecified: Secondary | ICD-10-CM | POA: Diagnosis not present

## 2023-12-30 DIAGNOSIS — F17210 Nicotine dependence, cigarettes, uncomplicated: Secondary | ICD-10-CM | POA: Diagnosis not present

## 2023-12-30 DIAGNOSIS — M47817 Spondylosis without myelopathy or radiculopathy, lumbosacral region: Secondary | ICD-10-CM | POA: Diagnosis not present

## 2024-01-03 DIAGNOSIS — Z79899 Other long term (current) drug therapy: Secondary | ICD-10-CM | POA: Diagnosis not present

## 2024-01-23 DIAGNOSIS — Z0000 Encounter for general adult medical examination without abnormal findings: Secondary | ICD-10-CM | POA: Diagnosis not present

## 2024-01-23 DIAGNOSIS — J449 Chronic obstructive pulmonary disease, unspecified: Secondary | ICD-10-CM | POA: Diagnosis not present

## 2024-01-23 DIAGNOSIS — J9612 Chronic respiratory failure with hypercapnia: Secondary | ICD-10-CM | POA: Diagnosis not present

## 2024-01-23 DIAGNOSIS — I739 Peripheral vascular disease, unspecified: Secondary | ICD-10-CM | POA: Diagnosis not present

## 2024-01-23 DIAGNOSIS — E46 Unspecified protein-calorie malnutrition: Secondary | ICD-10-CM | POA: Diagnosis not present

## 2024-01-23 DIAGNOSIS — Z681 Body mass index (BMI) 19 or less, adult: Secondary | ICD-10-CM | POA: Diagnosis not present

## 2024-01-30 DIAGNOSIS — Z79899 Other long term (current) drug therapy: Secondary | ICD-10-CM | POA: Diagnosis not present

## 2024-01-30 DIAGNOSIS — Z23 Encounter for immunization: Secondary | ICD-10-CM | POA: Diagnosis not present

## 2024-01-30 DIAGNOSIS — G8929 Other chronic pain: Secondary | ICD-10-CM | POA: Diagnosis not present

## 2024-01-30 DIAGNOSIS — Z681 Body mass index (BMI) 19 or less, adult: Secondary | ICD-10-CM | POA: Diagnosis not present

## 2024-01-30 DIAGNOSIS — J449 Chronic obstructive pulmonary disease, unspecified: Secondary | ICD-10-CM | POA: Diagnosis not present

## 2024-01-30 DIAGNOSIS — F17210 Nicotine dependence, cigarettes, uncomplicated: Secondary | ICD-10-CM | POA: Diagnosis not present

## 2024-02-02 DIAGNOSIS — Z79899 Other long term (current) drug therapy: Secondary | ICD-10-CM | POA: Diagnosis not present

## 2024-02-06 ENCOUNTER — Ambulatory Visit (INDEPENDENT_AMBULATORY_CARE_PROVIDER_SITE_OTHER): Admitting: Surgery

## 2024-02-06 ENCOUNTER — Encounter: Payer: Self-pay | Admitting: Surgery

## 2024-02-06 ENCOUNTER — Ambulatory Visit (HOSPITAL_COMMUNITY)
Admission: RE | Admit: 2024-02-06 | Discharge: 2024-02-06 | Disposition: A | Source: Ambulatory Visit | Attending: Surgery | Admitting: Surgery

## 2024-02-06 ENCOUNTER — Ambulatory Visit (HOSPITAL_BASED_OUTPATIENT_CLINIC_OR_DEPARTMENT_OTHER)
Admission: RE | Admit: 2024-02-06 | Discharge: 2024-02-06 | Disposition: A | Discharge status: Home / Self Care | Source: Ambulatory Visit | Attending: Surgery | Admitting: Surgery

## 2024-02-06 ENCOUNTER — Ambulatory Visit (HOSPITAL_COMMUNITY)
Admission: RE | Admit: 2024-02-06 | Discharge: 2024-02-06 | Disposition: A | Discharge status: Home / Self Care | Source: Ambulatory Visit | Attending: Surgery | Admitting: Surgery

## 2024-02-06 VITALS — BP 125/76 | HR 81 | Temp 98.4°F | Ht 62.0 in | Wt 74.0 lb

## 2024-02-06 DIAGNOSIS — I70213 Atherosclerosis of native arteries of extremities with intermittent claudication, bilateral legs: Secondary | ICD-10-CM | POA: Diagnosis not present

## 2024-02-06 LAB — VAS US ABI WITH/WO TBI
Left ABI: 0.61
Right ABI: 0.89

## 2024-02-06 MED ORDER — GABAPENTIN 300 MG PO CAPS: 300.0000 mg | ORAL_CAPSULE | Freq: Every day | ORAL | 6 refills | Status: AC

## 2024-02-06 NOTE — Progress Notes (Signed)
 [facility] [place]  Patient name: Lauren King MRN: [id number] DOB: 12-Oct-1950 Sex: female   REASON FOR VISIT:    Follow up  [name]:    Lauren King is a 73 y.o. female who has undergone the following procedures:   12/27/2017: Abdominal aortogram 11/21/2018: Abdominal aortogram 12/22/2018: Left femoral to tibioperoneal trunk bypass with saphenous vein (rest pain) 09/11/2019: Angioplasty left tibioperoneal trunk and peroneal artery, stent left external iliac artery rest pain 10/23/2019: Mechanical thrombectomy right external iliac and common femoral artery with stenting of the right common femoral, external iliac, and common iliac artery (rest pain) 02/02/2022: Angioplasty, left popliteal and peroneal artery, angioplasty, right common femoral artery (claudication) 03/23/2022: Mechanical thrombectomy of the right external iliac, common femoral, and superficial femoral artery, stent right superficial femoral, right external iliac, stent, left external iliac, stent left common iliac (rest pain) 05/27/2023: Right femoral endarterectomy with bovine patch angioplasty, left femoral endarterectomy with bovine pericardial patch angioplasty, right femoral to below-knee popliteal bypass graft with ipsilateral saphenous vein (rest pain), Endologix aortobiiliac stent 06/29/2023 I&D of left groin seroma 09/13/2023: 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  She is complaining of pins-and-needles in her right leg and a burning feeling.  She is taking her aspirin  and Plavix .  She is still smoking   She does suffer from COPD secondary to longstanding tobacco abuse.  She takes a statin for hypercholesterolemia.  She is on Plavix .    PAST MEDICAL HISTORY:   Past Medical  History:  Diagnosis Date   Anxiety    Chronic lower back pain    COPD (chronic obstructive pulmonary disease) (HCC)    Depression    GERD (gastroesophageal reflux disease)    Hypertension    Obstructive sleep apnea    CPAP   Peripheral vascular disease      FAMILY HISTORY:   Family History  Problem Relation Age of Onset   Lung cancer Sister    Lung cancer Brother    Colon cancer Neg Hx     SOCIAL HISTORY:   Social History   Tobacco Use   Smoking status: Some Days    Average packs/day: 0.4 packs/day for 40.0 years (16.0 ttl pk-yrs)    Types: Cigarettes    Start date: 05/11/2022    Last attempt to quit: 1972    Years since quitting: 53.7   Smokeless tobacco: Never  Substance Use Topics   Alcohol  use: Yes    Comment: maybe 1 drink every couple of months     ALLERGIES:   No Known Allergies   CURRENT MEDICATIONS:   Current Outpatient Medications  Medication Sig Dispense Refill   albuterol  (VENTOLIN  HFA) 108 (90 Base) MCG/ACT inhaler Inhale 1-2 puffs into the lungs every 6 (six) hours as needed for wheezing or shortness of breath.     amoxicillin -clavulanate (AUGMENTIN ) 875-125 MG tablet Take 1 tablet by mouth 2 (two) times daily. 28 tablet 0   aspirin  EC 81 MG tablet Take 81 mg by mouth daily.     clopidogrel  (PLAVIX ) 75 MG tablet Take 1 tablet (75 mg total) by mouth daily. 30 tablet 6   gabapentin  (NEURONTIN ) 100 MG capsule Take 1 capsule (100 mg total) by mouth 2 (two) times daily. (Patient not taking: Reported on 09/13/2023) 30 capsule 0   losartan  (COZAAR )  50 MG tablet Take 50 mg by mouth daily.     mirtazapine  (REMERON ) 15 MG tablet Take 15 mg by mouth at bedtime.     Multiple Vitamins-Minerals (MULTIVITAMIN WITH MINERALS) tablet Take 1 tablet by mouth daily.     Oxycodone  HCl 10 MG TABS Take 10 mg by mouth every 4 (four) hours as needed for severe pain.     rosuvastatin  (CRESTOR ) 20 MG tablet Take 20 mg by mouth at bedtime.     TRELEGY ELLIPTA 100-62.5-25  MCG/INH AEPB Inhale 1 puff into the lungs daily.     No current facility-administered medications for this visit.    REVIEW OF SYSTEMS:   [X]  denotes positive finding, [ ]  denotes negative finding Cardiac  Comments:  Chest pain or chest pressure:    Shortness of breath upon exertion:    Short of breath when lying flat:    Irregular heart rhythm:        Vascular    Pain in calf, thigh, or hip brought on by ambulation:    Pain in feet at night that wakes you up from your sleep:     Blood clot in your veins:    Leg swelling:         Pulmonary    Oxygen  at home:    Productive cough:     Wheezing:         Neurologic    Sudden weakness in arms or legs:     Sudden numbness in arms or legs:     Sudden onset of difficulty speaking or slurred speech:    Temporary loss of vision in one eye:     Problems with dizziness:         Gastrointestinal    Blood in stool:     Vomited blood:         Genitourinary    Burning when urinating:     Blood in urine:        Psychiatric    Major depression:         Hematologic    Bleeding problems:    Problems with blood clotting too easily:        Skin    Rashes or ulcers:        Constitutional    Fever or chills:      PHYSICAL EXAM:   There were no vitals filed for this visit.  GENERAL: The patient is a well-nourished female, in no acute distress. The vital signs are documented above. CARDIAC: There is a regular rate and rhythm.  VASCULAR: Groin incisions have completely healed with palpable femoral pulses PULMONARY: Non-labored respirations MUSCULOSKELETAL: There are no major deformities or cyanosis. NEUROLOGIC: No focal weakness or paresthesias are detected. SKIN: There are no ulcers or rashes noted. PSYCHIATRIC: The patient has a normal affect.  STUDIES:   I have reviewed the following: ABI/TBIToday's ABIToday's TBIPrevious ABIPrevious TBI  +-------+-----------+-----------+------------+------------+  Right .89         .44        0.0         0.0           +-------+-----------+-----------+------------+------------+  Left  .61        .17        .58         0.0           +-------+-----------+-----------+------------+------------+   Aortoiliac:  Location            Stent        +--------------------+-----------+  Right Common Iliac  no stenosis  +--------------------+-----------+  Left Common Iliac   no stenosis  +--------------------+-----------+  Right External Iliacno stenosis  +--------------------+-----------+  Left External Iliac no stenosis  +--------------------+-----------+   Lower:  Right: Patent CFA stent without evidence of stenosis.  Patent DFA without evidence of stenosis, s/p thrombectomy.  Patent ostial to below-knee popliteal artery bypass graft with velocities  consistent with 50-99% stenosis at the distal anastomosis.    MEDICAL ISSUES:   PAD: The patient describes what sounds like nerve pain in the right leg.  She has stopped taking her gabapentin  because she ran out and so I refilled her prescription of 300 mg daily.  I did discuss the ultrasound report that shows a high-grade stenosis at the distal anastomosis of her right femoral-popliteal bypass graft.  I think that this puts her bypass graft at risk for failure and so I have recommended angiography via a left femoral approach and intervention of the bypass graft if indicated.  I again stressed the importance of smoking cessation.    Malvina Serene CLORE, MD, FACS [facility] [phone] Pager [phone]

## 2024-02-06 NOTE — H&P (View-Only) (Signed)
 [facility] [place]  Patient name: Lauren King MRN: [id number] DOB: 12-Oct-1950 Sex: female   REASON FOR VISIT:    Follow up  [name]:    Lauren King is a 73 y.o. female who has undergone the following procedures:   12/27/2017: Abdominal aortogram 11/21/2018: Abdominal aortogram 12/22/2018: Left femoral to tibioperoneal trunk bypass with saphenous vein (rest pain) 09/11/2019: Angioplasty left tibioperoneal trunk and peroneal artery, stent left external iliac artery rest pain 10/23/2019: Mechanical thrombectomy right external iliac and common femoral artery with stenting of the right common femoral, external iliac, and common iliac artery (rest pain) 02/02/2022: Angioplasty, left popliteal and peroneal artery, angioplasty, right common femoral artery (claudication) 03/23/2022: Mechanical thrombectomy of the right external iliac, common femoral, and superficial femoral artery, stent right superficial femoral, right external iliac, stent, left external iliac, stent left common iliac (rest pain) 05/27/2023: Right femoral endarterectomy with bovine patch angioplasty, left femoral endarterectomy with bovine pericardial patch angioplasty, right femoral to below-knee popliteal bypass graft with ipsilateral saphenous vein (rest pain), Endologix aortobiiliac stent 06/29/2023 I&D of left groin seroma 09/13/2023: 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  She is complaining of pins-and-needles in her right leg and a burning feeling.  She is taking her aspirin  and Plavix .  She is still smoking   She does suffer from COPD secondary to longstanding tobacco abuse.  She takes a statin for hypercholesterolemia.  She is on Plavix .    PAST MEDICAL HISTORY:   Past Medical  History:  Diagnosis Date   Anxiety    Chronic lower back pain    COPD (chronic obstructive pulmonary disease) (HCC)    Depression    GERD (gastroesophageal reflux disease)    Hypertension    Obstructive sleep apnea    CPAP   Peripheral vascular disease      FAMILY HISTORY:   Family History  Problem Relation Age of Onset   Lung cancer Sister    Lung cancer Brother    Colon cancer Neg Hx     SOCIAL HISTORY:   Social History   Tobacco Use   Smoking status: Some Days    Average packs/day: 0.4 packs/day for 40.0 years (16.0 ttl pk-yrs)    Types: Cigarettes    Start date: 05/11/2022    Last attempt to quit: 1972    Years since quitting: 53.7   Smokeless tobacco: Never  Substance Use Topics   Alcohol  use: Yes    Comment: maybe 1 drink every couple of months     ALLERGIES:   No Known Allergies   CURRENT MEDICATIONS:   Current Outpatient Medications  Medication Sig Dispense Refill   albuterol  (VENTOLIN  HFA) 108 (90 Base) MCG/ACT inhaler Inhale 1-2 puffs into the lungs every 6 (six) hours as needed for wheezing or shortness of breath.     amoxicillin -clavulanate (AUGMENTIN ) 875-125 MG tablet Take 1 tablet by mouth 2 (two) times daily. 28 tablet 0   aspirin  EC 81 MG tablet Take 81 mg by mouth daily.     clopidogrel  (PLAVIX ) 75 MG tablet Take 1 tablet (75 mg total) by mouth daily. 30 tablet 6   gabapentin  (NEURONTIN ) 100 MG capsule Take 1 capsule (100 mg total) by mouth 2 (two) times daily. (Patient not taking: Reported on 09/13/2023) 30 capsule 0   losartan  (COZAAR )  50 MG tablet Take 50 mg by mouth daily.     mirtazapine  (REMERON ) 15 MG tablet Take 15 mg by mouth at bedtime.     Multiple Vitamins-Minerals (MULTIVITAMIN WITH MINERALS) tablet Take 1 tablet by mouth daily.     Oxycodone  HCl 10 MG TABS Take 10 mg by mouth every 4 (four) hours as needed for severe pain.     rosuvastatin  (CRESTOR ) 20 MG tablet Take 20 mg by mouth at bedtime.     TRELEGY ELLIPTA 100-62.5-25  MCG/INH AEPB Inhale 1 puff into the lungs daily.     No current facility-administered medications for this visit.    REVIEW OF SYSTEMS:   [X]  denotes positive finding, [ ]  denotes negative finding Cardiac  Comments:  Chest pain or chest pressure:    Shortness of breath upon exertion:    Short of breath when lying flat:    Irregular heart rhythm:        Vascular    Pain in calf, thigh, or hip brought on by ambulation:    Pain in feet at night that wakes you up from your sleep:     Blood clot in your veins:    Leg swelling:         Pulmonary    Oxygen  at home:    Productive cough:     Wheezing:         Neurologic    Sudden weakness in arms or legs:     Sudden numbness in arms or legs:     Sudden onset of difficulty speaking or slurred speech:    Temporary loss of vision in one eye:     Problems with dizziness:         Gastrointestinal    Blood in stool:     Vomited blood:         Genitourinary    Burning when urinating:     Blood in urine:        Psychiatric    Major depression:         Hematologic    Bleeding problems:    Problems with blood clotting too easily:        Skin    Rashes or ulcers:        Constitutional    Fever or chills:      PHYSICAL EXAM:   There were no vitals filed for this visit.  GENERAL: The patient is a well-nourished female, in no acute distress. The vital signs are documented above. CARDIAC: There is a regular rate and rhythm.  VASCULAR: Groin incisions have completely healed with palpable femoral pulses PULMONARY: Non-labored respirations MUSCULOSKELETAL: There are no major deformities or cyanosis. NEUROLOGIC: No focal weakness or paresthesias are detected. SKIN: There are no ulcers or rashes noted. PSYCHIATRIC: The patient has a normal affect.  STUDIES:   I have reviewed the following: ABI/TBIToday's ABIToday's TBIPrevious ABIPrevious TBI  +-------+-----------+-----------+------------+------------+  Right .89         .44        0.0         0.0           +-------+-----------+-----------+------------+------------+  Left  .61        .17        .58         0.0           +-------+-----------+-----------+------------+------------+   Aortoiliac:  Location            Stent        +--------------------+-----------+  Right Common Iliac  no stenosis  +--------------------+-----------+  Left Common Iliac   no stenosis  +--------------------+-----------+  Right External Iliacno stenosis  +--------------------+-----------+  Left External Iliac no stenosis  +--------------------+-----------+   Lower:  Right: Patent CFA stent without evidence of stenosis.  Patent DFA without evidence of stenosis, s/p thrombectomy.  Patent ostial to below-knee popliteal artery bypass graft with velocities  consistent with 50-99% stenosis at the distal anastomosis.    MEDICAL ISSUES:   PAD: The patient describes what sounds like nerve pain in the right leg.  She has stopped taking her gabapentin  because she ran out and so I refilled her prescription of 300 mg daily.  I did discuss the ultrasound report that shows a high-grade stenosis at the distal anastomosis of her right femoral-popliteal bypass graft.  I think that this puts her bypass graft at risk for failure and so I have recommended angiography via a left femoral approach and intervention of the bypass graft if indicated.  I again stressed the importance of smoking cessation.    Malvina Serene CLORE, MD, FACS [facility] [phone] Pager [phone]

## 2024-02-07 ENCOUNTER — Other Ambulatory Visit: Payer: Self-pay

## 2024-02-07 DIAGNOSIS — I70213 Atherosclerosis of native arteries of extremities with intermittent claudication, bilateral legs: Secondary | ICD-10-CM

## 2024-02-21 ENCOUNTER — Ambulatory Visit (HOSPITAL_COMMUNITY): Admission: RE | Admit: 2024-02-21 | Source: Home / Self Care | Admitting: Surgery

## 2024-02-21 ENCOUNTER — Encounter (HOSPITAL_COMMUNITY): Admission: RE | Payer: Self-pay | Source: Home / Self Care

## 2024-02-21 SURGERY — ABDOMINAL AORTOGRAM W/LOWER EXTREMITY
Anesthesia: LOCAL

## 2024-02-22 ENCOUNTER — Other Ambulatory Visit: Payer: Self-pay

## 2024-02-22 DIAGNOSIS — I70213 Atherosclerosis of native arteries of extremities with intermittent claudication, bilateral legs: Secondary | ICD-10-CM

## 2024-02-29 DIAGNOSIS — J449 Chronic obstructive pulmonary disease, unspecified: Secondary | ICD-10-CM | POA: Diagnosis not present

## 2024-02-29 DIAGNOSIS — Z681 Body mass index (BMI) 19 or less, adult: Secondary | ICD-10-CM | POA: Diagnosis not present

## 2024-02-29 DIAGNOSIS — Z79899 Other long term (current) drug therapy: Secondary | ICD-10-CM | POA: Diagnosis not present

## 2024-02-29 DIAGNOSIS — M545 Low back pain, unspecified: Secondary | ICD-10-CM | POA: Diagnosis not present

## 2024-02-29 DIAGNOSIS — G8929 Other chronic pain: Secondary | ICD-10-CM | POA: Diagnosis not present

## 2024-02-29 DIAGNOSIS — M47817 Spondylosis without myelopathy or radiculopathy, lumbosacral region: Secondary | ICD-10-CM | POA: Diagnosis not present

## 2024-03-06 ENCOUNTER — Other Ambulatory Visit: Payer: Self-pay

## 2024-03-06 ENCOUNTER — Encounter (HOSPITAL_COMMUNITY)
Admission: RE | Disposition: A | Discharge status: Home / Self Care | Payer: Self-pay | Source: Home / Self Care | Attending: Surgery

## 2024-03-06 ENCOUNTER — Ambulatory Visit (HOSPITAL_COMMUNITY)
Admission: RE | Admit: 2024-03-06 | Discharge: 2024-03-06 | Disposition: A | Discharge status: Home / Self Care | Attending: Surgery | Admitting: Surgery

## 2024-03-06 DIAGNOSIS — Y832 Surgical operation with anastomosis, bypass or graft as the cause of abnormal reaction of the patient, or of later complication, without mention of misadventure at the time of the procedure: Secondary | ICD-10-CM | POA: Diagnosis not present

## 2024-03-06 DIAGNOSIS — I70202 Unspecified atherosclerosis of native arteries of extremities, left leg: Secondary | ICD-10-CM

## 2024-03-06 DIAGNOSIS — E7800 Pure hypercholesterolemia, unspecified: Secondary | ICD-10-CM | POA: Insufficient documentation

## 2024-03-06 DIAGNOSIS — Z7902 Long term (current) use of antithrombotics/antiplatelets: Secondary | ICD-10-CM | POA: Diagnosis not present

## 2024-03-06 DIAGNOSIS — T82858A Stenosis of vascular prosthetic devices, implants and grafts, initial encounter: Secondary | ICD-10-CM | POA: Insufficient documentation

## 2024-03-06 DIAGNOSIS — Z9889 Other specified postprocedural states: Secondary | ICD-10-CM

## 2024-03-06 DIAGNOSIS — I70301 Unspecified atherosclerosis of unspecified type of bypass graft(s) of the extremities, right leg: Secondary | ICD-10-CM

## 2024-03-06 DIAGNOSIS — I771 Stricture of artery: Secondary | ICD-10-CM | POA: Insufficient documentation

## 2024-03-06 DIAGNOSIS — I70213 Atherosclerosis of native arteries of extremities with intermittent claudication, bilateral legs: Secondary | ICD-10-CM

## 2024-03-06 DIAGNOSIS — J449 Chronic obstructive pulmonary disease, unspecified: Secondary | ICD-10-CM | POA: Diagnosis not present

## 2024-03-06 DIAGNOSIS — Z79899 Other long term (current) drug therapy: Secondary | ICD-10-CM | POA: Diagnosis not present

## 2024-03-06 DIAGNOSIS — F17210 Nicotine dependence, cigarettes, uncomplicated: Secondary | ICD-10-CM | POA: Diagnosis not present

## 2024-03-06 HISTORY — PX: LOWER EXTREMITY INTERVENTION: CATH118252

## 2024-03-06 HISTORY — PX: ABDOMINAL AORTOGRAM W/LOWER EXTREMITY: CATH118223

## 2024-03-06 LAB — POCT I-STAT, CHEM 8
BUN: 4 mg/dL — ABNORMAL LOW (ref 8–23)
BUN: 5 mg/dL — ABNORMAL LOW (ref 8–23)
Calcium, Ion: 1.09 mmol/L — ABNORMAL LOW (ref 1.15–1.40)
Calcium, Ion: 1.09 mmol/L — ABNORMAL LOW (ref 1.15–1.40)
Chloride: 101 mmol/L (ref 98–111)
Chloride: 101 mmol/L (ref 98–111)
Creatinine, Ser: 0.5 mg/dL (ref 0.44–1.00)
Creatinine, Ser: 0.5 mg/dL (ref 0.44–1.00)
Glucose, Bld: 120 mg/dL — ABNORMAL HIGH (ref 70–99)
Glucose, Bld: 121 mg/dL — ABNORMAL HIGH (ref 70–99)
HCT: 34 % — ABNORMAL LOW (ref 36.0–46.0)
HCT: 37 % (ref 36.0–46.0)
Hemoglobin: 11.6 g/dL — ABNORMAL LOW (ref 12.0–15.0)
Hemoglobin: 12.6 g/dL (ref 12.0–15.0)
Potassium: 4.1 mmol/L (ref 3.5–5.1)
Potassium: 6.9 mmol/L (ref 3.5–5.1)
Sodium: 134 mmol/L — ABNORMAL LOW (ref 135–145)
Sodium: 137 mmol/L (ref 135–145)
TCO2: 26 mmol/L (ref 22–32)
TCO2: 29 mmol/L (ref 22–32)

## 2024-03-06 LAB — POCT ACTIVATED CLOTTING TIME: Activated Clotting Time: 141 s

## 2024-03-06 SURGERY — ABDOMINAL AORTOGRAM W/LOWER EXTREMITY
Anesthesia: LOCAL | Laterality: Right

## 2024-03-06 MED ORDER — SODIUM CHLORIDE 0.9% FLUSH
3.0000 mL | Freq: Two times a day (BID) | INTRAVENOUS | Status: DC
Start: 2024-03-07 — End: 2024-03-07

## 2024-03-06 MED ORDER — SODIUM CHLORIDE 0.9% FLUSH: 3.0000 mL | INTRAVENOUS | Status: DC | PRN

## 2024-03-06 MED ORDER — IODIXANOL 320 MG/ML IV SOLN
INTRAVENOUS | Status: DC | PRN
  Administered 2024-03-06: 80 mL

## 2024-03-06 MED ORDER — MIDAZOLAM HCL 2 MG/2ML IJ SOLN
INTRAMUSCULAR | Status: AC
  Filled 2024-03-06: qty 2

## 2024-03-06 MED ORDER — LABETALOL HCL 5 MG/ML IV SOLN: 10.0000 mg | INTRAVENOUS | Status: DC | PRN

## 2024-03-06 MED ORDER — OXYCODONE HCL 5 MG PO TABS
5.0000 mg | ORAL_TABLET | ORAL | Status: DC | PRN
  Administered 2024-03-06: 5 mg via ORAL
  Filled 2024-03-06: qty 1

## 2024-03-06 MED ORDER — SODIUM CHLORIDE 0.9 % IV SOLN: 250.0000 mL | INTRAVENOUS | Status: DC | PRN

## 2024-03-06 MED ORDER — HEPARIN SODIUM (PORCINE) 1000 UNIT/ML IJ SOLN
INTRAMUSCULAR | Status: DC | PRN
  Administered 2024-03-06: 3500 [IU] via INTRAVENOUS

## 2024-03-06 MED ORDER — FENTANYL CITRATE (PF) 100 MCG/2ML IJ SOLN
INTRAMUSCULAR | Status: AC
  Filled 2024-03-06: qty 2

## 2024-03-06 MED ORDER — HEPARIN SODIUM (PORCINE) 1000 UNIT/ML IJ SOLN
INTRAMUSCULAR | Status: AC
Start: 2024-03-06 — End: 2024-03-06
  Filled 2024-03-06: qty 10

## 2024-03-06 MED ORDER — SODIUM CHLORIDE 0.9 % IV SOLN: INTRAVENOUS | Status: DC

## 2024-03-06 MED ORDER — CLOPIDOGREL BISULFATE 75 MG PO TABS: 75.0000 mg | ORAL_TABLET | Freq: Every day | ORAL | Status: DC

## 2024-03-06 MED ORDER — MIDAZOLAM HCL (PF) 2 MG/2ML IJ SOLN
INTRAMUSCULAR | Status: DC | PRN
  Administered 2024-03-06 (×2): 1 mg via INTRAVENOUS

## 2024-03-06 MED ORDER — FENTANYL CITRATE (PF) 100 MCG/2ML IJ SOLN
INTRAMUSCULAR | Status: DC | PRN
  Administered 2024-03-06: 25 ug via INTRAVENOUS
  Administered 2024-03-06: 50 ug via INTRAVENOUS

## 2024-03-06 MED ORDER — LIDOCAINE HCL (PF) 1 % IJ SOLN
INTRAMUSCULAR | Status: DC | PRN
  Administered 2024-03-06: 10 mL

## 2024-03-06 MED ORDER — CLOPIDOGREL BISULFATE 300 MG PO TABS
ORAL_TABLET | ORAL | Status: DC | PRN
  Administered 2024-03-06: 75 mg via ORAL

## 2024-03-06 MED ORDER — HYDRALAZINE HCL 20 MG/ML IJ SOLN: 5.0000 mg | INTRAMUSCULAR | Status: DC | PRN

## 2024-03-06 MED ORDER — ONDANSETRON HCL 4 MG/2ML IJ SOLN: 4.0000 mg | Freq: Four times a day (QID) | INTRAMUSCULAR | Status: DC | PRN

## 2024-03-06 MED ORDER — ACETAMINOPHEN 325 MG PO TABS
650.0000 mg | ORAL_TABLET | ORAL | Status: DC | PRN
  Administered 2024-03-06: 650 mg via ORAL
  Filled 2024-03-06: qty 2

## 2024-03-06 MED ORDER — CLOPIDOGREL BISULFATE 75 MG PO TABS
ORAL_TABLET | ORAL | Status: AC
  Filled 2024-03-06: qty 1

## 2024-03-06 MED ORDER — MORPHINE SULFATE (PF) 2 MG/ML IV SOLN: 2.0000 mg | INTRAVENOUS | Status: DC | PRN

## 2024-03-06 MED ORDER — ASPIRIN 81 MG PO CHEW
CHEWABLE_TABLET | ORAL | Status: DC | PRN
  Administered 2024-03-06: 81 mg via ORAL

## 2024-03-06 MED ORDER — ASPIRIN 81 MG PO TBEC: 81.0000 mg | DELAYED_RELEASE_TABLET | Freq: Every day | ORAL | Status: DC

## 2024-03-06 MED ORDER — ASPIRIN 81 MG PO CHEW
CHEWABLE_TABLET | ORAL | Status: AC
  Filled 2024-03-06: qty 1

## 2024-03-06 MED ORDER — SODIUM CHLORIDE 0.9 % WEIGHT BASED INFUSION
1.0000 mL/kg/h | INTRAVENOUS | Status: DC
  Administered 2024-03-06: 1 mL/kg/h via INTRAVENOUS

## 2024-03-06 MED ORDER — HEPARIN (PORCINE) IN NACL 1000-0.9 UT/500ML-% IV SOLN
INTRAVENOUS | Status: DC | PRN
  Administered 2024-03-06 (×2): 500 mL

## 2024-03-06 SURGICAL SUPPLY — 13 items
BALLOON STERLING OTW 3X40X150 (BALLOONS) ×1 IMPLANT
CATH OMNI FLUSH 5F 65CM (CATHETERS) ×1 IMPLANT
COVER DOME SNAP 22 D (MISCELLANEOUS) ×2 IMPLANT
DCB RANGER 4.0X40 135 (BALLOONS) ×1 IMPLANT
KIT ENCORE 26 ADVANTAGE (KITS) ×1 IMPLANT
KIT MICROPUNCTURE NIT STIFF (SHEATH) ×1 IMPLANT
PACK CARDIAC CATHETERIZATION (CUSTOM PROCEDURE TRAY) ×1 IMPLANT
SET ATX-X65L (MISCELLANEOUS) ×1 IMPLANT
SHEATH CATAPULT 5F 45 MP (SHEATH) ×1 IMPLANT
SHEATH PINNACLE 5F 10CM (SHEATH) ×1 IMPLANT
SHEATH PROBE COVER 6X72 (BAG) ×1 IMPLANT
WIRE BENTSON .035X145CM (WIRE) ×1 IMPLANT
WIRE G V18X300CM (WIRE) ×1 IMPLANT

## 2024-03-06 NOTE — Interval H&P Note (Signed)
 History and Physical Interval Note:  03/06/2024 2:29 PM  Lauren King  has presented today for surgery, with the diagnosis of atherosclerosis bilateral.  The various methods of treatment have been discussed with the patient and family. After consideration of risks, benefits and other options for treatment, the patient has consented to  Procedure(s): ABDOMINAL AORTOGRAM W/LOWER EXTREMITY (N/A) Lower Extremity Angiography (N/A) as a surgical intervention.  The patient's history has been reviewed, patient examined, no change in status, stable for surgery.  I have reviewed the patient's chart and labs.  Questions were answered to the patient's satisfaction.     [name]

## 2024-03-06 NOTE — Op Note (Signed)
 Patient name: Lauren King MRN: [id number] DOB: [birth year] Sex: female  03/06/2024 Pre-operative Diagnosis: Right bypass graft stenosis Post-operative diagnosis:  Same Surgeon:  [name] Procedure Performed:  1.  Ultrasound-guided access, left femoral artery  2.  Abdominal aortogram  3.  Bilateral lower extremity angiogram  4.  Catheter selection into right femoral-popliteal bypass graft  5.  Drug-coated balloon angioplasty, right distal bypass graft (femoral-popliteal)  6.  Conscious sedation, 43 minutes    Indications: This is a 73 year old with extensive revascularization.  Surveillance imaging revealed a distal anastomotic stenosis on her right femoral-popliteal bypass graft.  She comes in today for further evaluation and possible intervention  Procedure:  The patient was identified in the holding area and taken to room 8.  The patient was then placed supine on the table and prepped and draped in the usual sterile fashion.  A time out was called.  Conscious sedation was administered with the use of IV fentanyl  and Versed  under continuous physician and nurse monitoring.  Heart rate, blood pressure, and oxygen  saturation were continuously monitored.  Total sedation time was 43 minutes.  Ultrasound was used to evaluate the left common femoral artery.  It was patent .  A digital ultrasound image was acquired.  A micropuncture needle was used to access the left common femoral artery under ultrasound guidance.  An 018 wire was advanced without resistance and a micropuncture sheath was placed.  The 018 wire was removed and a benson wire was placed.  The micropuncture sheath was exchanged for a 5 french sheath.  An omniflush catheter was advanced over the wire to the level of L-1.  An abdominal angiogram was obtained.  Next, using the omniflush catheter and a benson wire, the aortic bifurcation was crossed and the catheter was placed into theright external iliac artery and right runoff was  obtained.  left runoff was performed via retrograde sheath injections.  Findings:   Aortogram: No significant renal artery stenosis visualized.  The infrarenal abdominal aorta is widely patent.  Aortoiliac stenting is widely patent.  Bilateral external iliac stents are widely patent.  Right Lower Extremity: The right common femoral and profundofemoral artery are widely patent.  There is a bypass graft from the common femoral down to the popliteal artery which is patent however there is a 70% stenosis at the distal anastomosis.  There is single-vessel runoff via the posterior tibial artery  Left Lower Extremity: The left common femoral is dilated consistent with previous patch angioplasty.  The profundofemoral artery is widely patent.  The superficial femoral and popliteal artery are occluded.  There is reconstitution of the posterior tibial artery which is the dominant vessel across the ankle.  Intervention: After the above images were acquired the decision was made to proceed with intervention.  A 5 x 45 sheath was advanced into the right femoral-popliteal bypass graft.  The patient was fully heparinized.  Additional images were acquired with a catheter in the bypass graft to better define the lesion in the distal anastomosis.  A V-18 wire was advanced without difficulty across the anastomosis.  I initially elected to treat this with angioplasty using a 3 x 40 Sterling balloon.  Completion imaging showed improved but suboptimal result and so I upsized to a 4 x 40 drug-coated Ranger balloon and repeated drug-coated balloon angioplasty.  Completion imaging revealed resolution of the stenosis less than 10%.  Catheters and wires were removed.  The long sheath was exchanged out for short 5 French sheath.  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 [name], M.D., [facility] [place] Office: [phone] Pager:  [phone]

## 2024-03-06 NOTE — Progress Notes (Signed)
 Patient and patient daughter given discharge instructions, education provided no further questions at this time. Patient able to ambulate and void before discharge. Able to tolerate PO intake. Patient site is clean, dry, intact with no hematoma noted upon discharge. Verified with MD Brabham about when for patient to restart Plavix , written on discharge packet and reviewed with patient and daughter.

## 2024-03-06 NOTE — Interval H&P Note (Signed)
 History and Physical Interval Note:  03/06/2024 1:44 PM  Lauren King  has presented today for surgery, with the diagnosis of atherosclerosis bilateral.  The various methods of treatment have been discussed with the patient and family. After consideration of risks, benefits and other options for treatment, the patient has consented to  Procedure(s): ABDOMINAL AORTOGRAM W/LOWER EXTREMITY (N/A) Lower Extremity Angiography (N/A) as a surgical intervention.  The patient's history has been reviewed, patient examined, no change in status, stable for surgery.  I have reviewed the patient's chart and labs.  Questions were answered to the patient's satisfaction.     [name]

## 2024-03-07 ENCOUNTER — Encounter (HOSPITAL_COMMUNITY): Payer: Self-pay | Admitting: Surgery

## 2024-03-13 DIAGNOSIS — J9612 Chronic respiratory failure with hypercapnia: Secondary | ICD-10-CM | POA: Diagnosis not present

## 2024-03-13 DIAGNOSIS — J449 Chronic obstructive pulmonary disease, unspecified: Secondary | ICD-10-CM | POA: Diagnosis not present

## 2024-03-21 DIAGNOSIS — Z1231 Encounter for screening mammogram for malignant neoplasm of breast: Secondary | ICD-10-CM | POA: Diagnosis not present

## 2024-03-30 DIAGNOSIS — J449 Chronic obstructive pulmonary disease, unspecified: Secondary | ICD-10-CM | POA: Diagnosis not present

## 2024-03-30 DIAGNOSIS — M47817 Spondylosis without myelopathy or radiculopathy, lumbosacral region: Secondary | ICD-10-CM | POA: Diagnosis not present

## 2024-03-30 DIAGNOSIS — M545 Low back pain, unspecified: Secondary | ICD-10-CM | POA: Diagnosis not present

## 2024-03-30 DIAGNOSIS — Z681 Body mass index (BMI) 19 or less, adult: Secondary | ICD-10-CM | POA: Diagnosis not present

## 2024-03-30 DIAGNOSIS — Z79899 Other long term (current) drug therapy: Secondary | ICD-10-CM | POA: Diagnosis not present

## 2024-04-03 ENCOUNTER — Telehealth: Payer: Self-pay

## 2024-04-03 ENCOUNTER — Other Ambulatory Visit: Payer: Self-pay

## 2024-04-03 DIAGNOSIS — I739 Peripheral vascular disease, unspecified: Secondary | ICD-10-CM

## 2024-04-03 NOTE — Telephone Encounter (Signed)
 The patient called to report swelling in her right leg and pain in her right foot. She is S/P  Abdominal aortogram on 03/06/24.  PA Lucie Apt reviewed the chart and ordered DVT study and LE bypass graft duplex and ABI.   The patient was contacted regarding these appointments and she said she will not be able to make it 04/03/24 for DVT study but will be here 04/04/24 for the ABI and Bypass graft.  The patient was advised to attend DVT appointment but she stated that she has no ride today.  Writer encouraged the patient to seek ER if there is an increase in pain or change in color of the leg or foot prior to tomorrows appointments.  The patient verbalized understanding of the advice and appointment information.

## 2024-04-04 ENCOUNTER — Ambulatory Visit (HOSPITAL_COMMUNITY)
Admission: RE | Admit: 2024-04-04 | Discharge: 2024-04-04 | Disposition: A | Discharge status: Home / Self Care | Source: Ambulatory Visit | Attending: Surgery | Admitting: Surgery

## 2024-04-04 ENCOUNTER — Ambulatory Visit (INDEPENDENT_AMBULATORY_CARE_PROVIDER_SITE_OTHER): Admitting: Physician Assistant

## 2024-04-04 ENCOUNTER — Ambulatory Visit (HOSPITAL_BASED_OUTPATIENT_CLINIC_OR_DEPARTMENT_OTHER)
Admission: RE | Admit: 2024-04-04 | Discharge: 2024-04-04 | Disposition: A | Discharge status: Home / Self Care | Source: Ambulatory Visit | Attending: Surgery | Admitting: Surgery

## 2024-04-04 VITALS — BP 161/79 | HR 79 | Temp 98.0°F | Wt 78.9 lb

## 2024-04-04 DIAGNOSIS — I739 Peripheral vascular disease, unspecified: Secondary | ICD-10-CM

## 2024-04-04 DIAGNOSIS — R600 Localized edema: Secondary | ICD-10-CM

## 2024-04-04 DIAGNOSIS — I70213 Atherosclerosis of native arteries of extremities with intermittent claudication, bilateral legs: Secondary | ICD-10-CM

## 2024-04-04 LAB — VAS US ABI WITH/WO TBI
Left ABI: 0.66
Right ABI: 1.21

## 2024-04-04 NOTE — Progress Notes (Signed)
 Office Note     CC:  follow up Requesting Provider:  Orpha Yancey LABOR, MD  HPI: Lauren King is a 73 y.o. (1951-03-21) female who presents to clinic as an urgent add-on due to right leg pain and swelling.  She also reports some shortness of breath however this has been going on over the past couple months.  Most recently she underwent aortogram with drug-coated balloon angioplasty of the right distal bypass graft by Dr. Serene on 03/06/2024 due to bypass graft stenosis.  Surgical history is extensive and listed below:   12/27/2017: Abdominal aortogram 11/21/2018: Abdominal aortogram 12/22/2018: Left femoral to tibioperoneal trunk bypass with saphenous vein (rest pain) 09/11/2019: Angioplasty left tibioperoneal trunk and peroneal artery, stent left external iliac artery rest pain 10/23/2019: Mechanical thrombectomy right external iliac and common femoral artery with stenting of the right common femoral, external iliac, and common iliac artery (rest pain) 02/02/2022: Angioplasty, left popliteal and peroneal artery, angioplasty, right common femoral artery (claudication) 03/23/2022: Mechanical thrombectomy of the right external iliac, common femoral, and superficial femoral artery, stent right superficial femoral, right external iliac, stent, left external iliac, stent left common iliac (rest pain) 05/27/2023: Right femoral endarterectomy with bovine patch angioplasty, left femoral endarterectomy with bovine pericardial patch angioplasty, right femoral to below-knee popliteal bypass graft with ipsilateral saphenous vein (rest pain), Endologix aortobiiliac stent 06/29/2023 I&D of left groin seroma 09/13/2023: 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  She denies any significant pain in her right foot however does tolerate a certain level of discomfort from both legs  constantly.  She denies any tissue loss.  She is on aspirin , Plavix , statin daily.  She is able to walk however was seen today in a wheelchair out of convenience.  It should also be noted she has a known left SFA and popliteal occlusion with single-vessel PT runoff.   Past Medical History:  Diagnosis Date   Anxiety    Chronic lower back pain    COPD (chronic obstructive pulmonary disease) (HCC)    Depression    GERD (gastroesophageal reflux disease)    Hypertension    Obstructive sleep apnea    CPAP   Peripheral vascular disease     Past Surgical History:  Procedure Laterality Date   ABDOMINAL AORTIC ENDOVASCULAR STENT GRAFT N/A 05/27/2023   Procedure: AORTIC STENTING USING ENDOLOGIX GRAFT;  Surgeon: Serene Gaile ORN, MD;  Location: [facility];  Service: Vascular;  Laterality: N/A;   ABDOMINAL AORTOGRAM W/LOWER EXTREMITY N/A 09/11/2019   Procedure: ABDOMINAL AORTOGRAM W/LOWER EXTREMITY;  Surgeon: Serene Gaile ORN, MD;  Location: [facility];  Service: Cardiovascular;  Laterality: N/A;   ABDOMINAL AORTOGRAM W/LOWER EXTREMITY N/A 10/23/2019   Procedure: ABDOMINAL AORTOGRAM W/LOWER EXTREMITY;  Surgeon: Serene Gaile ORN, MD;  Location: [facility];  Service: Cardiovascular;  Laterality: N/A;   ABDOMINAL AORTOGRAM W/LOWER EXTREMITY N/A 02/02/2022   Procedure: ABDOMINAL AORTOGRAM W/LOWER EXTREMITY;  Surgeon: Serene Gaile ORN, MD;  [facility]: [facility];  Service: Cardiovascular;  Laterality: N/A;   ABDOMINAL AORTOGRAM W/LOWER EXTREMITY N/A 03/23/2022   Procedure: ABDOMINAL AORTOGRAM W/LOWER EXTREMITY;  Surgeon: Serene Gaile ORN, MD;  Location: [facility];  Service: Cardiovascular;  Laterality: N/A;   ABDOMINAL AORTOGRAM W/LOWER EXTREMITY N/A 03/29/2023   Procedure: ABDOMINAL AORTOGRAM W/LOWER EXTREMITY;  Surgeon: Serene Gaile ORN, MD;  [facility];  Service: Cardiovascular;  Laterality: N/A;   ABDOMINAL AORTOGRAM W/LOWER EXTREMITY Bilateral 03/06/2024    Procedure: ABDOMINAL AORTOGRAM W/LOWER EXTREMITY;  Surgeon: Serene Gaile ORN, MD;  [facility]: [facility];  Service: Cardiovascular;  Laterality: Bilateral;   ABDOMINAL HYSTERECTOMY     APPLICATION OF WOUND VAC Left 09/13/2023   Procedure: APPLICATION, WOUND VAC;  Surgeon: Sheree Penne Bruckner, MD;  Location: [facility] OR;  Service: Vascular;  Laterality: Left;   BYPASS GRAFT FEMORAL-PERONEAL Right 05/27/2023   Procedure: BYPASS GRAFT RIGHT FEMORAL TO BELOW THE KNEE POPITEAL ARTERY USING NONREVERSED RIGHT GREATER SAPHENOUS VEIN;  Surgeon: Serene Gaile ORN, MD;  Location: [facility];  Service: Vascular;  Laterality: Right;   CATARACT EXTRACTION     COLONOSCOPY  01/20/2005   MFM:[id number] colon polyp/Diminutive polyps at 30 cm ablated/Normal rectum. hyperplastic polyp   COLONOSCOPY N/A 03/25/2015   MFM:[id number] /left-sided diverticula   ENDARTERECTOMY FEMORAL Left 12/22/2018   Procedure: Endarterectomy Left Femoral and Politeal Arteries;  Surgeon: Serene Gaile ORN, MD;  Location: [facility] OR;  Service: Vascular;  Laterality: Left;   ENDARTERECTOMY FEMORAL Bilateral 05/27/2023   Procedure: RIGHT FEMORAL ENDARTERECTOMY WITH XENSURE BOVINE PERICARDIUM PATCH AND REDO LEFT FEMORAL ENDARTERECTOMY WITH XENSURE BOVINE PERICARDIUM PATCH;  Surgeon: Serene Gaile ORN, MD;  Location: [facility];  Service: Vascular;  Laterality: Bilateral;   ESOPHAGOGASTRODUODENOSCOPY  11/30/2004   RMR:A small patch of salmon-colored epithelium (salmon-like lesion in the distal esophagus)/Antral pre-pyloric erosions of uncertain significance, otherwise, normal. solitary duodenal bulbar AVM.No Barrett's.   ESOPHAGOGASTRODUODENOSCOPY N/A 03/25/2015   MFM:zmndpcz reflux/schatzkis ring s/p dilation/HH/short segment barrett   ESOPHAGOGASTRODUODENOSCOPY (EGD) WITH ESOPHAGEAL DILATION N/A 09/22/2012   Rourk: erosive RE, biopsy negative for Barrett's. Gastritis.    FEMORAL ARTERY EXPLORATION Left 09/13/2023   Procedure: EXPLORATION, ARTERY, FEMORAL;   Surgeon: Sheree Penne Bruckner, MD;  Location: [facility] OR;  Service: Vascular;  Laterality: Left;  WASHOUT OF LEFT GROIN WOUND   FEMORAL-TIBIAL BYPASS GRAFT Left 12/22/2018   Procedure: BYPASS GRAFT FEMORAL-TIBIAL ARTERY LEFT LEG USING NONREVERSED LEFT GREAT SAPHENOUS VEIN;  Surgeon: Serene Gaile ORN, MD;  Location: [facility];  Service: Vascular;  Laterality: Left;   INCISION AND DRAINAGE OF WOUND Left 06/29/2023   Procedure: LEFT GROIN IRRIGATION AND DEBRIDEMENT WOUND;  Surgeon: Magda Debby SAILOR, MD;  Location: [facility] OR;  Service: Vascular;  Laterality: Left;   LOWER EXTREMITY ANGIOGRAPHY N/A 12/27/2017   Procedure: LOWER EXTREMITY ANGIOGRAPHY;  Surgeon: Serene Gaile ORN, MD;  [facility];  Service: Cardiovascular;  Laterality: N/A;   LOWER EXTREMITY ANGIOGRAPHY N/A 11/21/2018   Procedure: LOWER EXTREMITY ANGIOGRAPHY;  Surgeon: Serene Gaile ORN, MD;  [facility];  Service: Cardiovascular;  Laterality: N/A;   LOWER EXTREMITY INTERVENTION Right 03/06/2024   Procedure: LOWER EXTREMITY INTERVENTION;  Surgeon: Serene Gaile ORN, MD;  [facility];  Service: Cardiovascular;  Laterality: Right;   MALONEY DILATION N/A 03/25/2015   Procedure: AGAPITO DILATION;  Surgeon: Lamar CHRISTELLA Hollingshead, MD;  [facility];  Service: Endoscopy;  Laterality: N/A;   MUSCLE FLAP CLOSURE Left 09/13/2023   Procedure: Sartorios MUSCLE FLAP;  Surgeon: Sheree Penne Bruckner, MD;  Location: [facility] OR;  Service: Vascular;  Laterality: Left;   PERIPHERAL VASCULAR BALLOON ANGIOPLASTY  02/02/2022   Procedure: PERIPHERAL VASCULAR BALLOON ANGIOPLASTY;  Surgeon: Serene Gaile ORN, MD;  [facility];  Service: Cardiovascular;;   PERIPHERAL VASCULAR INTERVENTION Left 09/11/2019   Procedure: PERIPHERAL VASCULAR INTERVENTION;  Surgeon: Serene Gaile ORN, MD;  [facility];  Service: Cardiovascular;  Laterality: Left;   PERIPHERAL VASCULAR INTERVENTION Right  10/23/2019   Procedure: PERIPHERAL VASCULAR INTERVENTION;  Surgeon: Serene Gaile ORN, MD;  [facility];  Service: Cardiovascular;  Laterality: Right;  external iliac   PERIPHERAL VASCULAR INTERVENTION  03/23/2022   Procedure: PERIPHERAL VASCULAR INTERVENTION;  Surgeon: Serene Gaile ORN, MD;  [facility]: [facility];  Service: Cardiovascular;;   PERIPHERAL VASCULAR THROMBECTOMY  03/23/2022   Procedure: PERIPHERAL VASCULAR THROMBECTOMY;  Surgeon: Serene Gaile ORN, MD;  [facility]: [facility];  Service: Cardiovascular;;   SKIN CANCER DESTRUCTION     THROMBECTOMY FEMORAL ARTERY Right 05/27/2023   Procedure: THROMBECTOMY OF ILIOFEMORAL ARTERY;  Surgeon: Serene Gaile ORN, MD;  Location: [facility] OR;  Service: Vascular;  Laterality: Right;   THROMBECTOMY FEMORAL ARTERY Left 09/13/2023   Procedure: THROMBECTOMY, ARTERY, FEMORAL, Left femoral artery patch angioplasty;  Surgeon: Sheree Penne Bruckner, MD;  Location: [facility] OR;  Service: Vascular;  Laterality: Left;   TUBAL LIGATION     with incidental appendectomy   VEIN HARVEST Left 12/22/2018   Procedure: Vein Harvest Left Great Saphenous;  Surgeon: Serene Gaile ORN, MD;  Location: [facility] OR;  Service: Vascular;  Laterality: Left;    Social History   Socioeconomic History   Marital status: Divorced    Spouse name: Not on file   Number of children: 2   Years of education: Not on file   Highest education level: Not on file  Occupational History   Not on file  Tobacco Use   Smoking status: Some Days    Average packs/day: 0.4 packs/day for 40.0 years (16.0 ttl pk-yrs)    Types: Cigarettes    Start date: 05/11/2022    Last attempt to quit: 1972    Years since quitting: 53.9   Smokeless tobacco: Never  Vaping Use   Vaping status: Former   Substances: Nicotine   Substance and Sexual Activity   Alcohol  use: Yes    Comment: maybe 1 drink every couple of months   Drug use: Yes    Types: Marijuana    Comment: smokes maybe 2 times  a month (says MD is aware)   Sexual activity: Not on file  Other Topics Concern   Not on file  Social History Narrative   Pt has had 5 children pass away. 2 living children   Social Drivers of Corporate Investment Banker Strain: Not on file  Food Insecurity: No Food Insecurity (09/13/2023)   Hunger Vital Sign    Worried About Running Out of Food in the Last Year: Never true    Ran Out of Food in the Last Year: Never true  Transportation Needs: No Transportation Needs (09/13/2023)   PRAPARE - Administrator, Civil Service (Medical): No    Lack of Transportation (Non-Medical): No  Physical Activity: Not on file  Stress: Not on file  Social Connections: Patient Declined (09/13/2023)   Social Connection and Isolation Panel    Frequency of Communication with Friends and Family: Patient declined    Frequency of Social Gatherings with Friends and Family: Patient declined    Attends Religious Services: Patient declined    Database Administrator or Organizations: Patient declined    Attends Banker Meetings: Patient declined    Marital Status: Patient declined  Intimate Partner Violence: Not At Risk (09/13/2023)   Humiliation, Afraid, Rape, and Kick questionnaire    Fear of Current or Ex-Partner:  No    Emotionally Abused: No    Physically Abused: No    Sexually Abused: No    Family History  Problem Relation Age of Onset   Lung cancer Sister    Lung cancer Brother    Colon cancer Neg Hx     Current Outpatient Medications  Medication Sig Dispense Refill   albuterol  (VENTOLIN  HFA) 108 (90 Base) MCG/ACT inhaler Inhale 1-2 puffs into the lungs every 6 (six) hours as needed for wheezing or shortness of breath.     aspirin  EC 81 MG tablet Take 81 mg by mouth daily.     clopidogrel  (PLAVIX ) 75 MG tablet Take 1 tablet (75 mg total) by mouth daily. 30 tablet 6   gabapentin  (NEURONTIN ) 300 MG capsule Take 1 capsule (300 mg total) by mouth daily. 30 capsule 6    ipratropium-albuterol  (DUONEB) 0.5-2.5 (3) MG/3ML SOLN Take 3 mLs by nebulization every 6 (six) hours as needed. Every 6-8 hours     losartan  (COZAAR ) 50 MG tablet Take 50 mg by mouth daily.     mirtazapine  (REMERON ) 15 MG tablet Take 15 mg by mouth at bedtime.     Multiple Vitamins-Minerals (MULTIVITAMIN WITH MINERALS) tablet Take 1 tablet by mouth daily.     Oxycodone  HCl 10 MG TABS Take 10 mg by mouth every 4 (four) hours as needed for severe pain.     rosuvastatin  (CRESTOR ) 20 MG tablet Take 20 mg by mouth at bedtime.     WIXELA INHUB 250-50 MCG/ACT AEPB Inhale 1 puff into the lungs 2 (two) times daily.     No current facility-administered medications for this visit.    No Known Allergies   REVIEW OF SYSTEMS:  Negative unless noted in HPI [X]  denotes positive finding, [ ]  denotes negative finding Cardiac  Comments:  Chest pain or chest pressure:    Shortness of breath upon exertion:    Short of breath when lying flat:    Irregular heart rhythm:        Vascular    Pain in calf, thigh, or hip brought on by ambulation:    Pain in feet at night that wakes you up from your sleep:     Blood clot in your veins:    Leg swelling:         Pulmonary    Oxygen  at home:    Productive cough:     Wheezing:         Neurologic    Sudden weakness in arms or legs:     Sudden numbness in arms or legs:     Sudden onset of difficulty speaking or slurred speech:    Temporary loss of vision in one eye:     Problems with dizziness:         Gastrointestinal    Blood in stool:     Vomited blood:         Genitourinary    Burning when urinating:     Blood in urine:        Psychiatric    Major depression:         Hematologic    Bleeding problems:    Problems with blood clotting too easily:        Skin    Rashes or ulcers:        Constitutional    Fever or chills:      PHYSICAL EXAMINATION:  Vitals:   04/04/24 0934  BP: (!) 161/79  Pulse: 79  Temp: 98 F (36.7 C)  TempSrc:  Temporal  Weight: 78 lb 14.4 oz (35.8 kg)    General:  WDWN in NAD; vital signs documented above Gait: Not observed HENT: WNL, normocephalic Pulmonary: normal non-labored breathing Cardiac: regular HR Abdomen: soft, NT, no masses Skin: without rashes Vascular Exam/Pulses: Brisk peroneal and DP signal by Doppler right lower extremity Extremities: without ischemic changes, without Gangrene , without cellulitis; without open wounds; pitting edema right leg to the level of mid shin Musculoskeletal: no muscle wasting or atrophy  Neurologic: A&O X 3 Psychiatric:  The pt has Normal affect.   Non-Invasive Vascular Imaging:   Arterial duplex demonstrates a widely patent right leg bypass without any distal anastomosis stenosis after intervention  ABI/TBIToday's ABIToday's TBIPrevious ABIPrevious TBI  +-------+-----------+-----------+------------+------------+  Right 1.21       0.85       0.86        0.44          +-------+-----------+-----------+------------+------------+  Left  0.66       0.31       0.61        0.71          +-------+-----------+-----------+------------+------------    Right leg DVT study negative    ASSESSMENT/PLAN:: 73 y.o. female who was brought into clinic as an urgent triage due to right leg pain and swelling  Fortunately right leg remains well-perfused.  Duplex demonstrates a widely patent right leg bypass graft.  Due to her right leg pitting edema and shortness of breath I also checked a DVT study which was negative.  Recommended light compression and periodic leg elevation to help with edema.  She will continue her aspirin , Plavix , statin daily.  She is already scheduled for repeat imaging studies in March of next year.  We will keep these appointments.  She will notify us  with any questions or concerns in the meantime.   Donnice Sender, PA-C [facility] [phone]  Clinic MD:   Sheree

## 2024-04-24 ENCOUNTER — Other Ambulatory Visit (HOSPITAL_COMMUNITY): Payer: Self-pay | Admitting: Internal Medicine

## 2024-04-24 DIAGNOSIS — Z87891 Personal history of nicotine dependence: Secondary | ICD-10-CM

## 2024-06-12 ENCOUNTER — Other Ambulatory Visit: Payer: Self-pay | Admitting: Surgery

## 2024-06-12 DIAGNOSIS — I70213 Atherosclerosis of native arteries of extremities with intermittent claudication, bilateral legs: Secondary | ICD-10-CM

## 2024-06-12 DIAGNOSIS — I70223 Atherosclerosis of native arteries of extremities with rest pain, bilateral legs: Secondary | ICD-10-CM

## 2024-06-12 DIAGNOSIS — I739 Peripheral vascular disease, unspecified: Secondary | ICD-10-CM

## 2024-06-13 ENCOUNTER — Encounter (HOSPITAL_COMMUNITY): Payer: Self-pay

## 2024-07-09 ENCOUNTER — Ambulatory Visit (HOSPITAL_COMMUNITY)

## 2024-07-09 ENCOUNTER — Ambulatory Visit: Admitting: Surgery
